# Patient Record
Sex: Female | Born: 1970 | Race: White | Hispanic: No | Marital: Married | Smoking: Former smoker
Health system: Southern US, Community
[De-identification: ages and names within clinical notes are randomized; demographics above are authoritative.]

## PROBLEM LIST (undated history)

## (undated) DIAGNOSIS — R002 Palpitations: Secondary | ICD-10-CM

## (undated) DIAGNOSIS — F419 Anxiety disorder, unspecified: Secondary | ICD-10-CM

## (undated) DIAGNOSIS — I10 Essential (primary) hypertension: Secondary | ICD-10-CM

## (undated) DIAGNOSIS — K219 Gastro-esophageal reflux disease without esophagitis: Secondary | ICD-10-CM

## (undated) DIAGNOSIS — R5383 Other fatigue: Secondary | ICD-10-CM

## (undated) HISTORY — DX: Palpitations: R00.2

## (undated) HISTORY — DX: Essential (primary) hypertension: I10

## (undated) HISTORY — PX: ABDOMINAL HYSTERECTOMY: SHX81

## (undated) HISTORY — DX: Gastro-esophageal reflux disease without esophagitis: K21.9

## (undated) HISTORY — DX: Anxiety disorder, unspecified: F41.9

## (undated) HISTORY — DX: Other fatigue: R53.83

---
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
## 1996-09-09 HISTORY — PX: DILATION AND CURETTAGE OF UTERUS: SHX78

## 1998-08-29 ENCOUNTER — Inpatient Hospital Stay (HOSPITAL_COMMUNITY): Admission: AD | Admit: 1998-08-29 | Discharge: 1998-08-29 | Payer: Self-pay | Admitting: Obstetrics and Gynecology

## 1998-09-02 ENCOUNTER — Inpatient Hospital Stay (HOSPITAL_COMMUNITY): Admission: AD | Admit: 1998-09-02 | Discharge: 1998-09-05 | Payer: Self-pay | Admitting: Obstetrics and Gynecology

## 1998-10-17 ENCOUNTER — Other Ambulatory Visit: Admission: RE | Admit: 1998-10-17 | Discharge: 1998-10-17 | Payer: Self-pay | Admitting: Obstetrics and Gynecology

## 2000-06-10 ENCOUNTER — Other Ambulatory Visit: Admission: RE | Admit: 2000-06-10 | Discharge: 2000-06-10 | Payer: Self-pay | Admitting: Obstetrics and Gynecology

## 2004-01-24 ENCOUNTER — Encounter: Admission: RE | Admit: 2004-01-24 | Discharge: 2004-01-24 | Payer: Self-pay | Admitting: Family Medicine

## 2005-03-13 ENCOUNTER — Other Ambulatory Visit: Admission: RE | Admit: 2005-03-13 | Discharge: 2005-03-13 | Payer: Self-pay | Admitting: Obstetrics and Gynecology

## 2005-03-19 ENCOUNTER — Encounter: Admission: RE | Admit: 2005-03-19 | Discharge: 2005-03-19 | Payer: Self-pay | Admitting: Obstetrics and Gynecology

## 2006-12-04 ENCOUNTER — Ambulatory Visit (HOSPITAL_COMMUNITY): Admission: RE | Admit: 2006-12-04 | Discharge: 2006-12-05 | Payer: Self-pay | Admitting: Obstetrics and Gynecology

## 2007-02-06 DIAGNOSIS — F411 Generalized anxiety disorder: Secondary | ICD-10-CM | POA: Insufficient documentation

## 2007-02-06 DIAGNOSIS — G43909 Migraine, unspecified, not intractable, without status migrainosus: Secondary | ICD-10-CM

## 2007-02-06 DIAGNOSIS — F17200 Nicotine dependence, unspecified, uncomplicated: Secondary | ICD-10-CM

## 2007-02-06 DIAGNOSIS — G4700 Insomnia, unspecified: Secondary | ICD-10-CM

## 2007-02-06 HISTORY — DX: Generalized anxiety disorder: F41.1

## 2007-02-06 HISTORY — DX: Insomnia, unspecified: G47.00

## 2007-02-06 HISTORY — DX: Nicotine dependence, unspecified, uncomplicated: F17.200

## 2007-02-06 HISTORY — DX: Migraine, unspecified, not intractable, without status migrainosus: G43.909

## 2017-03-18 DIAGNOSIS — L728 Other follicular cysts of the skin and subcutaneous tissue: Secondary | ICD-10-CM | POA: Diagnosis not present

## 2017-03-18 DIAGNOSIS — L930 Discoid lupus erythematosus: Secondary | ICD-10-CM | POA: Diagnosis not present

## 2017-03-18 DIAGNOSIS — L570 Actinic keratosis: Secondary | ICD-10-CM | POA: Diagnosis not present

## 2017-03-18 DIAGNOSIS — L578 Other skin changes due to chronic exposure to nonionizing radiation: Secondary | ICD-10-CM | POA: Diagnosis not present

## 2017-03-18 DIAGNOSIS — Z79899 Other long term (current) drug therapy: Secondary | ICD-10-CM | POA: Diagnosis not present

## 2017-12-05 DIAGNOSIS — Z79899 Other long term (current) drug therapy: Secondary | ICD-10-CM | POA: Diagnosis not present

## 2017-12-05 DIAGNOSIS — R232 Flushing: Secondary | ICD-10-CM | POA: Diagnosis not present

## 2017-12-05 DIAGNOSIS — R609 Edema, unspecified: Secondary | ICD-10-CM | POA: Diagnosis not present

## 2017-12-05 DIAGNOSIS — L932 Other local lupus erythematosus: Secondary | ICD-10-CM | POA: Diagnosis not present

## 2017-12-05 DIAGNOSIS — R0789 Other chest pain: Secondary | ICD-10-CM | POA: Diagnosis not present

## 2018-01-09 DIAGNOSIS — K0889 Other specified disorders of teeth and supporting structures: Secondary | ICD-10-CM | POA: Diagnosis not present

## 2018-02-10 DIAGNOSIS — N951 Menopausal and female climacteric states: Secondary | ICD-10-CM | POA: Diagnosis not present

## 2018-02-10 DIAGNOSIS — Z6821 Body mass index (BMI) 21.0-21.9, adult: Secondary | ICD-10-CM | POA: Diagnosis not present

## 2018-02-28 DIAGNOSIS — R9431 Abnormal electrocardiogram [ECG] [EKG]: Secondary | ICD-10-CM | POA: Diagnosis not present

## 2018-02-28 DIAGNOSIS — R072 Precordial pain: Secondary | ICD-10-CM | POA: Diagnosis not present

## 2018-02-28 DIAGNOSIS — K50019 Crohn's disease of small intestine with unspecified complications: Secondary | ICD-10-CM | POA: Diagnosis not present

## 2018-02-28 DIAGNOSIS — E876 Hypokalemia: Secondary | ICD-10-CM | POA: Diagnosis not present

## 2018-02-28 DIAGNOSIS — R945 Abnormal results of liver function studies: Secondary | ICD-10-CM | POA: Diagnosis not present

## 2018-02-28 DIAGNOSIS — R079 Chest pain, unspecified: Secondary | ICD-10-CM | POA: Diagnosis not present

## 2018-03-01 DIAGNOSIS — R112 Nausea with vomiting, unspecified: Secondary | ICD-10-CM

## 2018-03-01 DIAGNOSIS — R079 Chest pain, unspecified: Secondary | ICD-10-CM | POA: Diagnosis not present

## 2018-03-01 DIAGNOSIS — K50019 Crohn's disease of small intestine with unspecified complications: Secondary | ICD-10-CM | POA: Diagnosis not present

## 2018-03-01 DIAGNOSIS — E871 Hypo-osmolality and hyponatremia: Secondary | ICD-10-CM | POA: Diagnosis not present

## 2018-03-01 DIAGNOSIS — R072 Precordial pain: Secondary | ICD-10-CM | POA: Diagnosis not present

## 2018-03-01 DIAGNOSIS — D72829 Elevated white blood cell count, unspecified: Secondary | ICD-10-CM | POA: Diagnosis not present

## 2018-03-01 DIAGNOSIS — N179 Acute kidney failure, unspecified: Secondary | ICD-10-CM | POA: Diagnosis not present

## 2018-03-01 DIAGNOSIS — R739 Hyperglycemia, unspecified: Secondary | ICD-10-CM | POA: Diagnosis not present

## 2018-03-01 DIAGNOSIS — E876 Hypokalemia: Secondary | ICD-10-CM | POA: Diagnosis not present

## 2018-03-01 DIAGNOSIS — R9431 Abnormal electrocardiogram [ECG] [EKG]: Secondary | ICD-10-CM | POA: Diagnosis not present

## 2018-03-01 DIAGNOSIS — K5000 Crohn's disease of small intestine without complications: Secondary | ICD-10-CM

## 2018-03-10 DIAGNOSIS — E279 Disorder of adrenal gland, unspecified: Secondary | ICD-10-CM | POA: Diagnosis not present

## 2018-03-10 DIAGNOSIS — R079 Chest pain, unspecified: Secondary | ICD-10-CM | POA: Diagnosis not present

## 2018-03-10 DIAGNOSIS — K5000 Crohn's disease of small intestine without complications: Secondary | ICD-10-CM | POA: Diagnosis not present

## 2018-04-07 DIAGNOSIS — L821 Other seborrheic keratosis: Secondary | ICD-10-CM | POA: Diagnosis not present

## 2018-04-07 DIAGNOSIS — L930 Discoid lupus erythematosus: Secondary | ICD-10-CM | POA: Diagnosis not present

## 2018-04-07 DIAGNOSIS — B079 Viral wart, unspecified: Secondary | ICD-10-CM | POA: Diagnosis not present

## 2018-04-07 DIAGNOSIS — L578 Other skin changes due to chronic exposure to nonionizing radiation: Secondary | ICD-10-CM | POA: Diagnosis not present

## 2018-05-02 DIAGNOSIS — Z6821 Body mass index (BMI) 21.0-21.9, adult: Secondary | ICD-10-CM | POA: Diagnosis not present

## 2018-05-02 DIAGNOSIS — R300 Dysuria: Secondary | ICD-10-CM | POA: Diagnosis not present

## 2018-05-19 DIAGNOSIS — R1033 Periumbilical pain: Secondary | ICD-10-CM | POA: Diagnosis not present

## 2018-05-19 DIAGNOSIS — R1013 Epigastric pain: Secondary | ICD-10-CM | POA: Diagnosis not present

## 2018-05-19 DIAGNOSIS — K591 Functional diarrhea: Secondary | ICD-10-CM | POA: Diagnosis not present

## 2018-06-06 DIAGNOSIS — R935 Abnormal findings on diagnostic imaging of other abdominal regions, including retroperitoneum: Secondary | ICD-10-CM | POA: Diagnosis not present

## 2018-06-06 DIAGNOSIS — K222 Esophageal obstruction: Secondary | ICD-10-CM | POA: Diagnosis not present

## 2018-06-06 DIAGNOSIS — K209 Esophagitis, unspecified: Secondary | ICD-10-CM | POA: Diagnosis not present

## 2018-06-06 DIAGNOSIS — K591 Functional diarrhea: Secondary | ICD-10-CM | POA: Diagnosis not present

## 2018-06-06 DIAGNOSIS — K210 Gastro-esophageal reflux disease with esophagitis: Secondary | ICD-10-CM | POA: Diagnosis not present

## 2018-06-06 DIAGNOSIS — K219 Gastro-esophageal reflux disease without esophagitis: Secondary | ICD-10-CM | POA: Diagnosis not present

## 2018-06-06 DIAGNOSIS — D128 Benign neoplasm of rectum: Secondary | ICD-10-CM | POA: Diagnosis not present

## 2018-06-30 DIAGNOSIS — R918 Other nonspecific abnormal finding of lung field: Secondary | ICD-10-CM | POA: Diagnosis not present

## 2018-06-30 DIAGNOSIS — L932 Other local lupus erythematosus: Secondary | ICD-10-CM | POA: Diagnosis not present

## 2018-06-30 DIAGNOSIS — Z79899 Other long term (current) drug therapy: Secondary | ICD-10-CM | POA: Diagnosis not present

## 2018-06-30 DIAGNOSIS — E279 Disorder of adrenal gland, unspecified: Secondary | ICD-10-CM | POA: Diagnosis not present

## 2018-07-14 DIAGNOSIS — M25512 Pain in left shoulder: Secondary | ICD-10-CM | POA: Diagnosis not present

## 2018-07-14 DIAGNOSIS — R918 Other nonspecific abnormal finding of lung field: Secondary | ICD-10-CM | POA: Diagnosis not present

## 2018-07-14 DIAGNOSIS — K591 Functional diarrhea: Secondary | ICD-10-CM | POA: Diagnosis not present

## 2018-07-14 DIAGNOSIS — R1013 Epigastric pain: Secondary | ICD-10-CM | POA: Diagnosis not present

## 2018-07-14 DIAGNOSIS — M1711 Unilateral primary osteoarthritis, right knee: Secondary | ICD-10-CM | POA: Diagnosis not present

## 2018-07-14 DIAGNOSIS — M25562 Pain in left knee: Secondary | ICD-10-CM | POA: Diagnosis not present

## 2018-07-14 DIAGNOSIS — E279 Disorder of adrenal gland, unspecified: Secondary | ICD-10-CM | POA: Diagnosis not present

## 2018-07-14 DIAGNOSIS — M79642 Pain in left hand: Secondary | ICD-10-CM | POA: Diagnosis not present

## 2018-07-14 DIAGNOSIS — M79641 Pain in right hand: Secondary | ICD-10-CM | POA: Diagnosis not present

## 2018-07-14 DIAGNOSIS — D3501 Benign neoplasm of right adrenal gland: Secondary | ICD-10-CM | POA: Diagnosis not present

## 2018-07-14 DIAGNOSIS — D3502 Benign neoplasm of left adrenal gland: Secondary | ICD-10-CM | POA: Diagnosis not present

## 2018-07-14 DIAGNOSIS — K219 Gastro-esophageal reflux disease without esophagitis: Secondary | ICD-10-CM | POA: Diagnosis not present

## 2018-07-14 DIAGNOSIS — M25561 Pain in right knee: Secondary | ICD-10-CM | POA: Diagnosis not present

## 2018-07-17 DIAGNOSIS — L988 Other specified disorders of the skin and subcutaneous tissue: Secondary | ICD-10-CM | POA: Diagnosis not present

## 2018-12-20 DIAGNOSIS — S46812A Strain of other muscles, fascia and tendons at shoulder and upper arm level, left arm, initial encounter: Secondary | ICD-10-CM | POA: Diagnosis not present

## 2019-01-05 DIAGNOSIS — R918 Other nonspecific abnormal finding of lung field: Secondary | ICD-10-CM | POA: Diagnosis not present

## 2019-01-05 DIAGNOSIS — M546 Pain in thoracic spine: Secondary | ICD-10-CM | POA: Diagnosis not present

## 2019-01-05 DIAGNOSIS — Z79899 Other long term (current) drug therapy: Secondary | ICD-10-CM | POA: Diagnosis not present

## 2019-01-05 DIAGNOSIS — L932 Other local lupus erythematosus: Secondary | ICD-10-CM | POA: Diagnosis not present

## 2019-01-08 DIAGNOSIS — R918 Other nonspecific abnormal finding of lung field: Secondary | ICD-10-CM | POA: Diagnosis not present

## 2019-01-08 DIAGNOSIS — D3502 Benign neoplasm of left adrenal gland: Secondary | ICD-10-CM | POA: Diagnosis not present

## 2019-01-08 DIAGNOSIS — I700 Atherosclerosis of aorta: Secondary | ICD-10-CM | POA: Diagnosis not present

## 2019-04-09 DIAGNOSIS — L570 Actinic keratosis: Secondary | ICD-10-CM | POA: Diagnosis not present

## 2019-04-09 DIAGNOSIS — L821 Other seborrheic keratosis: Secondary | ICD-10-CM | POA: Diagnosis not present

## 2019-04-09 DIAGNOSIS — L578 Other skin changes due to chronic exposure to nonionizing radiation: Secondary | ICD-10-CM | POA: Diagnosis not present

## 2019-04-09 DIAGNOSIS — L930 Discoid lupus erythematosus: Secondary | ICD-10-CM | POA: Diagnosis not present

## 2019-04-09 DIAGNOSIS — D485 Neoplasm of uncertain behavior of skin: Secondary | ICD-10-CM | POA: Diagnosis not present

## 2021-03-10 ENCOUNTER — Telehealth: Payer: Self-pay | Admitting: Hematology and Oncology

## 2021-03-10 NOTE — Telephone Encounter (Signed)
Patient referred by Cyndi Bender, PA-C for Lung Nodules.  Appt made for 03/13/21 Labs 1:00 pm - Consult 1:30 pm

## 2021-03-13 ENCOUNTER — Other Ambulatory Visit: Payer: Self-pay

## 2021-03-13 ENCOUNTER — Telehealth: Payer: Self-pay | Admitting: Hematology and Oncology

## 2021-03-13 ENCOUNTER — Inpatient Hospital Stay (INDEPENDENT_AMBULATORY_CARE_PROVIDER_SITE_OTHER): Payer: Commercial Managed Care - PPO | Admitting: Hematology and Oncology

## 2021-03-13 ENCOUNTER — Other Ambulatory Visit: Payer: Self-pay | Admitting: Hematology and Oncology

## 2021-03-13 ENCOUNTER — Inpatient Hospital Stay: Payer: Commercial Managed Care - PPO | Attending: Hematology and Oncology

## 2021-03-13 ENCOUNTER — Encounter: Payer: Self-pay | Admitting: Hematology and Oncology

## 2021-03-13 VITALS — BP 117/65 | HR 94 | Temp 98.2°F | Resp 18 | Ht 61.0 in | Wt 116.9 lb

## 2021-03-13 DIAGNOSIS — L931 Subacute cutaneous lupus erythematosus: Secondary | ICD-10-CM | POA: Diagnosis present

## 2021-03-13 DIAGNOSIS — R918 Other nonspecific abnormal finding of lung field: Secondary | ICD-10-CM

## 2021-03-13 LAB — HEPATIC FUNCTION PANEL
ALT: 51 — AB (ref 7–35)
AST: 50 — AB (ref 13–35)
Alkaline Phosphatase: 89 (ref 25–125)
Bilirubin, Total: 0.2

## 2021-03-13 LAB — CBC AND DIFFERENTIAL
HCT: 40 (ref 36–46)
Hemoglobin: 13.5 (ref 12.0–16.0)
Neutrophils Absolute: 2.94
Platelets: 223 (ref 150–399)
WBC: 6.4

## 2021-03-13 LAB — BASIC METABOLIC PANEL
BUN: 10 (ref 4–21)
CO2: 26 — AB (ref 13–22)
Chloride: 105 (ref 99–108)
Creatinine: 0.6 (ref 0.5–1.1)
Glucose: 107
Potassium: 3.9 (ref 3.4–5.3)
Sodium: 140 (ref 137–147)

## 2021-03-13 LAB — CBC
MCV: 92 (ref 81–99)
RBC: 4.34 (ref 3.87–5.11)

## 2021-03-13 LAB — COMPREHENSIVE METABOLIC PANEL
Albumin: 4.5 (ref 3.5–5.0)
Calcium: 9.2 (ref 8.7–10.7)

## 2021-03-13 LAB — PROTIME-INR
INR: 0.9 (ref 0.8–1.2)
Prothrombin Time: 12.2 seconds (ref 11.4–15.2)

## 2021-03-13 NOTE — Progress Notes (Signed)
[facility]  [address] [place],  [place]  [address] [phone]  Clinic Day:  03/13/2021  Referring physician: Cyndi Bender, PA-C   CHIEF COMPLAINT:  CC: A 50 year old female with history of slightly increasing lung nodules mildly hypermetabolic on PET here for further treatment planning.   Current Treatment:  Diagnostic   HISTORY OF PRESENT ILLNESS:  Kirsten Williams is a 50 y.o. female with a history of lung nodules dating back to 2019 who is referred in consultation with Cyndi Bender, PA-C for assessment and management. She is a former smoker having quit in 2016. 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 She missed her screening CT last year and imaging was ordered last month. 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 PET imaging was recommended. PET images revealed some of the ground-glass density nodules are mildly hypermetabolic including the right upper lobe sub solid nodule (1.0 cm) with SUV 2.5 suspicious for multifocal low grade adenocarcinoma; mildly hypermetabolic upper normal sized lower paratracheal lymph node (0.8 cm) with maximum SUV of 4.0 as compared to background blood pool activity of 2.2. Malignant involvement is difficult to exclude.   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 She follows with Dr. Lyda Jester for chronic nausea and vomiting. Her last episode was in December 2021.She had a colonoscopy in 2019 which revealed rectal polyps. Repeat examination was recommended for 5 years (2024). She also had a hysterectomy in 2001. She is not up to date on mammogram and thinks her most recent was a couple of years ago. She has a chronic cough. Today she denies fever, chills, nausea or vomiting. She denies shortness of breath or  chest pain. She denies issue with bowel or bladder.  She denies family history of cancer. CBC today is unremarkable. CMP reveals elevated liver enzymes for which she states she has had in the past.   REVIEW OF SYSTEMS:  Review of Systems  Constitutional: Negative for appetite change, chills, diaphoresis, fatigue, fever and unexpected weight change.  HENT:   Negative for hearing loss, lump/mass, mouth sores, nosebleeds, sore throat, tinnitus, trouble swallowing and voice change.   Eyes: Negative for eye problems and icterus.  Respiratory: Negative for chest tightness, cough, hemoptysis, shortness of breath and wheezing.   Cardiovascular: Negative for chest pain, leg swelling and palpitations.  Gastrointestinal: Negative for abdominal distention, abdominal pain, blood in stool, constipation, diarrhea, nausea, rectal pain and vomiting.  Endocrine: Negative for hot flashes.  Genitourinary: Negative for bladder incontinence, difficulty urinating, dyspareunia, dysuria, frequency, hematuria and nocturia.   Musculoskeletal: Negative for arthralgias, back pain, flank pain, gait problem, myalgias, neck pain and neck stiffness.  Skin: Negative for itching, rash and wound.  Neurological: Negative for dizziness, extremity weakness, gait problem, headaches, light-headedness, numbness, seizures and speech difficulty.  Hematological: Negative for adenopathy. Does not bruise/bleed easily.  Psychiatric/Behavioral: Negative for confusion, decreased concentration, depression, sleep disturbance and suicidal ideas. The patient is not nervous/anxious.      VITALS:  Blood pressure 117/65, pulse 94, temperature 98.2 F (36.8 C), temperature source Oral, resp. rate 18, height 5\' 1"  (1.549 m), weight 116 lb 14.4 oz (53 kg), SpO2 96 %.  Wt Readings from Last 3 Encounters:  03/13/21 116 lb 14.4 oz (53 kg)    Body mass index is 22.09 kg/m.  Performance status (ECOG):  0 - Asymptomatic  PHYSICAL EXAM:  Physical  Exam Constitutional:      General: She is not in acute distress.    Appearance: Normal appearance. She is normal weight. She is not ill-appearing, toxic-appearing or diaphoretic.  HENT:     Head: Normocephalic and atraumatic.     Nose: Nose normal. No congestion or rhinorrhea.     Mouth/Throat:     Mouth: Mucous membranes are moist.     Pharynx: Oropharynx is clear. No oropharyngeal exudate or posterior oropharyngeal erythema.  Eyes:     General: No scleral icterus.       Right eye: No discharge.        Left eye: No discharge.     Extraocular Movements: Extraocular movements intact.     Conjunctiva/sclera: Conjunctivae normal.     Pupils: Pupils are equal, round, and reactive to light.  Neck:     Vascular: No carotid bruit.  Cardiovascular:     Rate and Rhythm: Normal rate and regular rhythm.     Heart sounds: No murmur heard. No friction rub. No gallop.   Pulmonary:     Effort: Pulmonary effort is normal. No respiratory distress.     Breath sounds: Normal breath sounds. No stridor. No wheezing, rhonchi or rales.  Chest:     Chest wall: No tenderness.  Abdominal:     General: Abdomen is flat. Bowel sounds are normal. There is no distension.     Palpations: There is no mass.     Tenderness: There is no abdominal tenderness. There is no right CVA tenderness, left CVA tenderness, guarding or rebound.     Hernia: No hernia is present.  Musculoskeletal:        General: No swelling, tenderness, deformity or signs of injury. Normal range of motion.     Cervical back: Normal range of motion and neck supple. No rigidity or tenderness.     Right lower leg: No edema.     Left lower leg: No edema.  Lymphadenopathy:     Cervical: No cervical adenopathy.  Skin:    General: Skin is warm and dry.     Capillary Refill: Capillary refill takes less than 2 seconds.     Coloration: Skin is not jaundiced or pale.     Findings: No bruising, erythema, lesion or rash.  Neurological:     General:  No focal deficit present.     Mental Status: She is alert and oriented to person, place, and time. Mental status is at baseline.     Cranial Nerves: No cranial nerve deficit.     Sensory: No sensory deficit.     Motor: No weakness.     Coordination: Coordination normal.     Gait: Gait normal.     Deep Tendon Reflexes: Reflexes normal.  Psychiatric:        Mood and Affect: Mood normal.        Behavior: Behavior normal.        Thought Content: Thought content normal.        Judgment: Judgment normal.    Lymph nodes:   There is no cervical, clavicular, axillary or lymphadenopathy.  LABS:   CBC Latest Ref Rng & Units 03/13/2021  WBC - 6.4  Hemoglobin 12.0 - 16.0 13.5  Hematocrit 36 - 46 40  Platelets 150 - 399 223   CMP Latest Ref Rng & Units 03/13/2021  BUN 4 - 21 10  Creatinine 0.5 - 1.1 0.6  Sodium 137 -  147 140  Potassium 3.4 - 5.3 3.9  Chloride 99 - 108 105  CO2 13 - 22 26(A)  Calcium 8.7 - 10.7 9.2  Alkaline Phos 25 - 125 89  AST 13 - 35 50(A)  ALT 7 - 35 51(A)     No results found for: CEA1 / No results found for: CEA1 No results found for: PSA1 No results found for: PJS315 No results found for: CAN125  No results found for: TOTALPROTELP, ALBUMINELP, A1GS, A2GS, BETS, BETA2SER, GAMS, MSPIKE, SPEI No results found for: TIBC, FERRITIN, IRONPCTSAT No results found for: LDH  STUDIES:  No results found.    HISTORY:   Past Medical History:  Diagnosis Date  . Anxiety   . GERD (gastroesophageal reflux disease)       Family History  Problem Relation Age of Onset  . Stroke Mother   . Stroke Father   . Cancer Father   . Hypertension Sister     Social History:  reports that she quit smoking about 6 years ago. Her smoking use included cigarettes. She has a 48.00 pack-year smoking history. She has never used smokeless tobacco. She reports previous alcohol use. No history on file for drug use.The patient is alone  today.  Allergies: No Known  Allergies  Current Medications: Current Outpatient Medications  Medication Sig Dispense Refill  . dicyclomine (BENTYL) 10 MG capsule Take 10 mg by mouth 4 (four) times daily -  before meals and at bedtime.    . Multiple Vitamin (MULTIVITAMIN) tablet Take 1 tablet by mouth daily.    Marland Kitchen omeprazole (PRILOSEC) 40 MG capsule Take 40 mg by mouth daily.    . rizatriptan (MAXALT) 10 MG tablet Take 10 mg by mouth as needed. May repeat in 2 hours if needed    . cyclobenzaprine (FLEXERIL) 10 MG tablet Take 1 tablet by mouth at bedtime.    . hydroxychloroquine (PLAQUENIL) 200 MG tablet Take 200 mg by mouth 2 (two) times daily.    . ondansetron (ZOFRAN) 4 MG tablet Take 8 mg by mouth 2 (two) times daily as needed.     No current facility-administered medications for this visit.     ASSESSMENT & PLAN:   Assessment:  Kirsten Williams is a 50 y.o. female with a history of lung nodules that are now mildly hypermetabolic on PET imaging. We reviewed the CT and PET reports in detail. We discussed the size of the lung nodules increasing slightly, but also more solid in appearance. We would like Pulmonary's assistance in evaluating the patient and possible biopsy. She understands that our concern is rule out malignancy and she is agreeable to referral.   Plan: 1.  I will refer to [facility] in [facility] for evaluation and possible tissue biopsy. 2.  She will return to clinic in 2 weeks for review of Pulmonology's findings and to discuss further treatment planning.   She verbalizes understanding of and agreement to the plans discussed today. She knows to call the office should any new questions or concerns arise.    Melodye Ped, NP

## 2021-03-13 NOTE — Telephone Encounter (Signed)
Per 4/4 LOS/Melissa, patient scheduled for 4/18 Follow Up.  Gave patient Appt Summary

## 2021-03-24 NOTE — Progress Notes (Deleted)
[facility]  [address] [place],  [place]  [address] [phone]  Clinic Day:  03/24/2021  Referring physician: Cyndi Bender, PA-C   CHIEF COMPLAINT:  CC: A 50 year old female with history of slightly increasing lung nodules mildly hypermetabolic on PET here for further treatment planning.   Current Treatment:  Diagnostic   HISTORY OF PRESENT ILLNESS:  Kirsten Williams is a 50 y.o. female with a history of lung nodules dating back to 2019 who is referred in consultation with Cyndi Bender, PA-C for assessment and management. She is a former smoker having quit in 2016. 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 She missed her screening CT last year and imaging was ordered last month. 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 PET imaging was recommended. PET images revealed some of the ground-glass density nodules are mildly hypermetabolic including the right upper lobe sub solid nodule (1.0 cm) with SUV 2.5 suspicious for multifocal low grade adenocarcinoma; mildly hypermetabolic upper normal sized lower paratracheal lymph node (0.8 cm) with maximum SUV of 4.0 as compared to background blood pool activity of 2.2. Malignant involvement is difficult to exclude.   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 She follows with Dr. Lyda Jester for chronic nausea and vomiting. Her last episode was in December 2021.She had a colonoscopy in 2019 which revealed rectal polyps. Repeat examination was recommended for 5 years (2024). She also had a hysterectomy in 2001. She is not up to date on mammogram and thinks her most recent was a couple of years ago. She has a chronic cough. Today she denies fever, chills, nausea or vomiting. She denies shortness of breath or  chest pain. She denies issue with bowel or bladder.  She denies family history of cancer. CBC today is unremarkable. CMP reveals elevated liver enzymes for which she states she has had in the past.   REVIEW OF SYSTEMS:  Review of Systems  Constitutional: Negative for appetite change, chills, diaphoresis, fatigue, fever and unexpected weight change.  HENT:   Negative for hearing loss, lump/mass, mouth sores, nosebleeds, sore throat, tinnitus, trouble swallowing and voice change.   Eyes: Negative for eye problems and icterus.  Respiratory: Negative for chest tightness, cough, hemoptysis, shortness of breath and wheezing.   Cardiovascular: Negative for chest pain, leg swelling and palpitations.  Gastrointestinal: Negative for abdominal distention, abdominal pain, blood in stool, constipation, diarrhea, nausea, rectal pain and vomiting.  Endocrine: Negative for hot flashes.  Genitourinary: Negative for bladder incontinence, difficulty urinating, dyspareunia, dysuria, frequency, hematuria and nocturia.   Musculoskeletal: Negative for arthralgias, back pain, flank pain, gait problem, myalgias, neck pain and neck stiffness.  Skin: Negative for itching, rash and wound.  Neurological: Negative for dizziness, extremity weakness, gait problem, headaches, light-headedness, numbness, seizures and speech difficulty.  Hematological: Negative for adenopathy. Does not bruise/bleed easily.  Psychiatric/Behavioral: Negative for confusion, decreased concentration, depression, sleep disturbance and suicidal ideas. The patient is not nervous/anxious.      VITALS:  There were no vitals taken for this visit.  Wt Readings from Last 3 Encounters:  03/13/21 116 lb 14.4 oz (53 kg)    There is no height or weight on file to calculate BMI.  Performance status (ECOG): 0 - Asymptomatic  PHYSICAL EXAM:  Physical Exam Constitutional:      General: She is  not in acute distress.    Appearance: Normal appearance. She is  normal weight. She is not ill-appearing, toxic-appearing or diaphoretic.  HENT:     Head: Normocephalic and atraumatic.     Nose: Nose normal. No congestion or rhinorrhea.     Mouth/Throat:     Mouth: Mucous membranes are moist.     Pharynx: Oropharynx is clear. No oropharyngeal exudate or posterior oropharyngeal erythema.  Eyes:     General: No scleral icterus.       Right eye: No discharge.        Left eye: No discharge.     Extraocular Movements: Extraocular movements intact.     Conjunctiva/sclera: Conjunctivae normal.     Pupils: Pupils are equal, round, and reactive to light.  Neck:     Vascular: No carotid bruit.  Cardiovascular:     Rate and Rhythm: Normal rate and regular rhythm.     Heart sounds: No murmur heard. No friction rub. No gallop.   Pulmonary:     Effort: Pulmonary effort is normal. No respiratory distress.     Breath sounds: Normal breath sounds. No stridor. No wheezing, rhonchi or rales.  Chest:     Chest wall: No tenderness.  Abdominal:     General: Abdomen is flat. Bowel sounds are normal. There is no distension.     Palpations: There is no mass.     Tenderness: There is no abdominal tenderness. There is no right CVA tenderness, left CVA tenderness, guarding or rebound.     Hernia: No hernia is present.  Musculoskeletal:        General: No swelling, tenderness, deformity or signs of injury. Normal range of motion.     Cervical back: Normal range of motion and neck supple. No rigidity or tenderness.     Right lower leg: No edema.     Left lower leg: No edema.  Lymphadenopathy:     Cervical: No cervical adenopathy.  Skin:    General: Skin is warm and dry.     Capillary Refill: Capillary refill takes less than 2 seconds.     Coloration: Skin is not jaundiced or pale.     Findings: No bruising, erythema, lesion or rash.  Neurological:     General: No focal deficit present.     Mental Status: She is alert and oriented to person, place, and time. Mental  status is at baseline.     Cranial Nerves: No cranial nerve deficit.     Sensory: No sensory deficit.     Motor: No weakness.     Coordination: Coordination normal.     Gait: Gait normal.     Deep Tendon Reflexes: Reflexes normal.  Psychiatric:        Mood and Affect: Mood normal.        Behavior: Behavior normal.        Thought Content: Thought content normal.        Judgment: Judgment normal.    Lymph nodes:   There is no cervical, clavicular, axillary or lymphadenopathy.  LABS:   CBC Latest Ref Rng & Units 03/13/2021  WBC - 6.4  Hemoglobin 12.0 - 16.0 13.5  Hematocrit 36 - 46 40  Platelets 150 - 399 223   CMP Latest Ref Rng & Units 03/13/2021  BUN 4 - 21 10  Creatinine 0.5 - 1.1 0.6  Sodium 137 - 147 140  Potassium 3.4 - 5.3 3.9  Chloride 99 - 108 105  CO2 13 -  22 26(A)  Calcium 8.7 - 10.7 9.2  Alkaline Phos 25 - 125 89  AST 13 - 35 50(A)  ALT 7 - 35 51(A)     No results found for: CEA1 / No results found for: CEA1 No results found for: PSA1 No results found for: TDH741 No results found for: CAN125  No results found for: TOTALPROTELP, ALBUMINELP, A1GS, A2GS, BETS, BETA2SER, GAMS, MSPIKE, SPEI No results found for: TIBC, FERRITIN, IRONPCTSAT No results found for: LDH  STUDIES:  No results found.    HISTORY:   Past Medical History:  Diagnosis Date  . Anxiety   . GERD (gastroesophageal reflux disease)       Family History  Problem Relation Age of Onset  . Stroke Mother   . Stroke Father   . Cancer Father   . Hypertension Sister     Social History:  reports that she quit smoking about 6 years ago. Her smoking use included cigarettes. She has a 48.00 pack-year smoking history. She has never used smokeless tobacco. She reports previous alcohol use. No history on file for drug use.The patient is alone  today.  Allergies: No Known Allergies  Current Medications: Current Outpatient Medications  Medication Sig Dispense Refill  . cyclobenzaprine  (FLEXERIL) 10 MG tablet Take 1 tablet by mouth at bedtime.    . dicyclomine (BENTYL) 10 MG capsule Take 10 mg by mouth 4 (four) times daily -  before meals and at bedtime.    . hydroxychloroquine (PLAQUENIL) 200 MG tablet Take 200 mg by mouth 2 (two) times daily.    . Multiple Vitamin (MULTIVITAMIN) tablet Take 1 tablet by mouth daily.    Marland Kitchen omeprazole (PRILOSEC) 40 MG capsule Take 40 mg by mouth daily.    . ondansetron (ZOFRAN) 4 MG tablet Take 8 mg by mouth 2 (two) times daily as needed.    . rizatriptan (MAXALT) 10 MG tablet Take 10 mg by mouth as needed. May repeat in 2 hours if needed     No current facility-administered medications for this visit.     ASSESSMENT & PLAN:   Assessment:  Kirsten Williams is a 50 y.o. female with a history of lung nodules that are now mildly hypermetabolic on PET imaging. We reviewed the CT and PET reports in detail. We discussed the size of the lung nodules increasing slightly, but also more solid in appearance. We would like Pulmonary's assistance in evaluating the patient and possible biopsy. She understands that our concern is rule out malignancy and she is agreeable to referral.   Plan: 1.  I will refer to [facility] in [facility] for evaluation and possible tissue biopsy. 2.  She will return to clinic in 2 weeks for review of Pulmonology's findings and to discuss further treatment planning.   She verbalizes understanding of and agreement to the plans discussed today. She knows to call the office should any new questions or concerns arise.    Melodye Ped, NP

## 2021-03-27 ENCOUNTER — Other Ambulatory Visit: Payer: Self-pay

## 2021-03-27 ENCOUNTER — Inpatient Hospital Stay (INDEPENDENT_AMBULATORY_CARE_PROVIDER_SITE_OTHER): Payer: Commercial Managed Care - PPO | Admitting: Hematology and Oncology

## 2021-03-27 VITALS — BP 123/60 | HR 82 | Temp 98.1°F | Resp 18 | Ht 61.0 in | Wt 115.9 lb

## 2021-03-27 DIAGNOSIS — R918 Other nonspecific abnormal finding of lung field: Secondary | ICD-10-CM

## 2021-03-27 NOTE — Progress Notes (Signed)
[facility]  [address] [place],  [place]  [address] [phone]  Clinic Day:  03/27/2021  Referring physician: Cyndi Bender, PA-C   CHIEF COMPLAINT:  CC: A 50- year old female with history of lung nodules here for further workup regarding lung nodules  Current Treatment:  Diagnostic   HISTORY OF PRESENT ILLNESS:  Kirsten Williams is a 50 y.o. female with a history of lung nodules dating back to 2019 who is referred in consultation with Cyndi Bender, PA-C for assessment and management. She is a former smoker having quit in 2016. 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 She missed her screening CT last year and imaging was ordered last month. 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 PET imaging was recommended. PET images revealed some of the ground-glass density nodules are mildly hypermetabolic including the right upper lobe sub solid nodule (1.0 cm) with SUV 2.5 suspicious for multifocal low grade adenocarcinoma; mildly hypermetabolic upper normal sized lower paratracheal lymph node (0.8 cm) with maximum SUV of 4.0 as compared to background blood pool activity of 2.2. Malignant involvement is difficult to exclude.   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 She follows with Dr. Lyda Jester for chronic nausea and vomiting. Her last episode was in December 2021.She had a colonoscopy in 2019 which revealed rectal polyps. Repeat examination was recommended for 5 years (2024). She also had a hysterectomy in 2001. She is not up to date on mammogram and thinks her most recent was a couple of years ago.   INTERVAL HISTORY:  Kirsten Williams is here today for follow up of the plan for referral to pulmonology. She has not heard from the [facility]  office regarding a referral placed two weeks ago. The office states they have reached out to the patient on 3 different occasions and did not receive a call back, so the referral was cancelled. I verified the patient's phone number with both her and the office. She will wait for their call this week. She continues to feel well. She denies fever, chills, nausea or vomiting. She denies issue with bowel or bladder. She denies shortness of breath, chest pain or cough.   REVIEW OF SYSTEMS:  Review of Systems  Constitutional: Negative for appetite change, chills, diaphoresis, fatigue, fever and unexpected weight change.  HENT:   Negative for hearing loss, lump/mass, mouth sores, nosebleeds, sore throat, tinnitus, trouble swallowing and voice change.   Eyes: Negative for eye problems and icterus.  Respiratory: Negative for chest tightness, cough, hemoptysis, shortness of breath and wheezing.   Cardiovascular: Negative for chest pain, leg swelling and palpitations.  Gastrointestinal: Negative for abdominal distention, abdominal pain, blood in stool, constipation, diarrhea, nausea, rectal pain and vomiting.  Endocrine: Negative for hot flashes.  Genitourinary: Negative for bladder incontinence, difficulty urinating, dyspareunia, dysuria, frequency, hematuria and nocturia.   Musculoskeletal: Negative for arthralgias, back pain, flank pain, gait problem, myalgias, neck pain and neck stiffness.  Skin: Negative for itching, rash and wound.  Neurological: Negative for dizziness, extremity weakness, gait problem, headaches, light-headedness, numbness, seizures and speech difficulty.  Hematological: Negative for adenopathy. Does not bruise/bleed easily.  Psychiatric/Behavioral: Negative for confusion, decreased concentration, depression, sleep disturbance and suicidal ideas. The patient is not nervous/anxious.      VITALS:  Blood pressure 123/60, pulse 82, temperature 98.1 F (  36.7 C), temperature source Oral,  resp. rate 18, height 5\' 1"  (1.549 m), weight 115 lb 14.4 oz (52.6 kg), SpO2 100 %.  Wt Readings from Last 3 Encounters:  03/27/21 115 lb 14.4 oz (52.6 kg)  03/13/21 116 lb 14.4 oz (53 kg)    Body mass index is 21.9 kg/m.  Performance status (ECOG): 1 - Symptomatic but completely ambulatory  PHYSICAL EXAM:  Physical Exam Constitutional:      General: She is not in acute distress.    Appearance: Normal appearance. She is normal weight. She is not ill-appearing, toxic-appearing or diaphoretic.  HENT:     Head: Normocephalic and atraumatic.     Nose: Nose normal. No congestion or rhinorrhea.     Mouth/Throat:     Mouth: Mucous membranes are moist.     Pharynx: Oropharynx is clear. No oropharyngeal exudate or posterior oropharyngeal erythema.  Eyes:     General: No scleral icterus.       Right eye: No discharge.        Left eye: No discharge.     Extraocular Movements: Extraocular movements intact.     Conjunctiva/sclera: Conjunctivae normal.     Pupils: Pupils are equal, round, and reactive to light.  Neck:     Vascular: No carotid bruit.  Cardiovascular:     Rate and Rhythm: Normal rate and regular rhythm.     Heart sounds: No murmur heard. No friction rub. No gallop.   Pulmonary:     Effort: Pulmonary effort is normal. No respiratory distress.     Breath sounds: Normal breath sounds. No stridor. No wheezing, rhonchi or rales.  Chest:     Chest wall: No tenderness.  Abdominal:     General: Abdomen is flat. Bowel sounds are normal. There is no distension.     Palpations: There is no mass.     Tenderness: There is no abdominal tenderness. There is no right CVA tenderness, left CVA tenderness, guarding or rebound.     Hernia: No hernia is present.  Musculoskeletal:        General: No swelling, tenderness, deformity or signs of injury. Normal range of motion.     Cervical back: Normal range of motion and neck supple. No rigidity or tenderness.     Right lower leg: No edema.      Left lower leg: No edema.  Lymphadenopathy:     Cervical: No cervical adenopathy.  Skin:    General: Skin is warm and dry.     Capillary Refill: Capillary refill takes less than 2 seconds.     Coloration: Skin is not jaundiced or pale.     Findings: No bruising, erythema, lesion or rash.  Neurological:     General: No focal deficit present.     Mental Status: She is alert and oriented to person, place, and time. Mental status is at baseline.     Cranial Nerves: No cranial nerve deficit.     Sensory: No sensory deficit.     Motor: No weakness.     Coordination: Coordination normal.     Gait: Gait normal.     Deep Tendon Reflexes: Reflexes normal.  Psychiatric:        Mood and Affect: Mood normal.        Behavior: Behavior normal.        Thought Content: Thought content normal.        Judgment: Judgment normal.     LABS:   CBC Latest Ref Rng &  Units 03/13/2021  WBC - 6.4  Hemoglobin 12.0 - 16.0 13.5  Hematocrit 36 - 46 40  Platelets 150 - 399 223   CMP Latest Ref Rng & Units 03/13/2021  BUN 4 - 21 10  Creatinine 0.5 - 1.1 0.6  Sodium 137 - 147 140  Potassium 3.4 - 5.3 3.9  Chloride 99 - 108 105  CO2 13 - 22 26(A)  Calcium 8.7 - 10.7 9.2  Alkaline Phos 25 - 125 89  AST 13 - 35 50(A)  ALT 7 - 35 51(A)     No results found for: CEA1 / No results found for: CEA1 No results found for: PSA1 No results found for: HEN277 No results found for: OEU235  No results found for: TOTALPROTELP, ALBUMINELP, A1GS, A2GS, BETS, BETA2SER, GAMS, MSPIKE, SPEI No results found for: TIBC, FERRITIN, IRONPCTSAT No results found for: LDH  STUDIES:  No results found.    HISTORY:   Past Medical History:  Diagnosis Date  . Anxiety   . GERD (gastroesophageal reflux disease)       Family History  Problem Relation Age of Onset  . Stroke Mother   . Stroke Father   . Cancer Father   . Hypertension Sister     Social History:  reports that she quit smoking about 6 years ago. Her  smoking use included cigarettes. She has a 48.00 pack-year smoking history. She has never used smokeless tobacco. She reports previous alcohol use. No history on file for drug use.The patient is alone  today.  Allergies: No Known Allergies  Current Medications: Current Outpatient Medications  Medication Sig Dispense Refill  . cyclobenzaprine (FLEXERIL) 10 MG tablet Take 1 tablet by mouth at bedtime.    . dicyclomine (BENTYL) 10 MG capsule Take 10 mg by mouth 4 (four) times daily -  before meals and at bedtime.    . hydroxychloroquine (PLAQUENIL) 200 MG tablet Take 200 mg by mouth 2 (two) times daily.    . Multiple Vitamin (MULTIVITAMIN) tablet Take 1 tablet by mouth daily.    Marland Kitchen omeprazole (PRILOSEC) 40 MG capsule Take 40 mg by mouth daily.    . ondansetron (ZOFRAN) 4 MG tablet Take 8 mg by mouth 2 (two) times daily as needed.    . rizatriptan (MAXALT) 10 MG tablet Take 10 mg by mouth as needed. May repeat in 2 hours if needed     No current facility-administered medications for this visit.     ASSESSMENT & PLAN:   Assessment:  Kirsten Williams is a 50 y.o. female with a history of lung nodules that are now mildly hypermetabolic on PET imaging. We reviewed the CT and PET reports in detail. We discussed the size of the lung nodules increasing slightly, but also more solid in appearance. We would like Pulmonary's assistance in evaluating the patient and possible biopsy. She understands that our concern is rule out malignancy and she is agreeable to referral. She has not heard from the office as of today. I spoke with the office who states they reached out to her multiple times without success.   Plan: She is still agreeable to referral. I verified her phone number with the office and patient. She knows to be expecting a call this week regarding referral. If she has not received an appointment by Thursday, I will contact the office again for her.   The patient understands the plans discussed  today and is in agreement with them.  She knows to contact our office  if she develops concerns prior to her next appointment.     Melodye Ped, NP

## 2021-04-12 ENCOUNTER — Other Ambulatory Visit: Payer: Self-pay

## 2021-04-12 ENCOUNTER — Encounter: Payer: Self-pay | Admitting: Emergency Medicine

## 2021-04-12 ENCOUNTER — Ambulatory Visit (INDEPENDENT_AMBULATORY_CARE_PROVIDER_SITE_OTHER): Payer: Commercial Managed Care - PPO | Admitting: Emergency Medicine

## 2021-04-12 VITALS — BP 112/70 | HR 80 | Temp 98.1°F | Ht 61.0 in | Wt 116.6 lb

## 2021-04-12 DIAGNOSIS — R918 Other nonspecific abnormal finding of lung field: Secondary | ICD-10-CM | POA: Diagnosis not present

## 2021-04-12 LAB — CBC WITH DIFFERENTIAL/PLATELET
Basophils Absolute: 0.1 10*3/uL (ref 0.0–0.1)
Basophils Relative: 0.7 % (ref 0.0–3.0)
Eosinophils Absolute: 0.2 10*3/uL (ref 0.0–0.7)
Eosinophils Relative: 2.8 % (ref 0.0–5.0)
HCT: 41.1 % (ref 36.0–46.0)
Hemoglobin: 14 g/dL (ref 12.0–15.0)
Lymphocytes Relative: 38.2 % (ref 12.0–46.0)
Lymphs Abs: 3 10*3/uL (ref 0.7–4.0)
MCHC: 34.1 g/dL (ref 30.0–36.0)
MCV: 91.5 fl (ref 78.0–100.0)
Monocytes Absolute: 0.5 10*3/uL (ref 0.1–1.0)
Monocytes Relative: 6.1 % (ref 3.0–12.0)
Neutro Abs: 4.1 10*3/uL (ref 1.4–7.7)
Neutrophils Relative %: 52.2 % (ref 43.0–77.0)
Platelets: 242 10*3/uL (ref 150.0–400.0)
RBC: 4.5 Mil/uL (ref 3.87–5.11)
RDW: 13.5 % (ref 11.5–15.5)
WBC: 7.9 10*3/uL (ref 4.0–10.5)

## 2021-04-12 LAB — BASIC METABOLIC PANEL
BUN: 11 mg/dL (ref 6–23)
CO2: 30 mEq/L (ref 19–32)
Calcium: 9.9 mg/dL (ref 8.4–10.5)
Chloride: 104 mEq/L (ref 96–112)
Creatinine, Ser: 0.72 mg/dL (ref 0.40–1.20)
GFR: 97.38 mL/min (ref >60.00)
Glucose, Bld: 90 mg/dL (ref 70–99)
Potassium: 4.8 mEq/L (ref 3.5–5.1)
Sodium: 138 mEq/L (ref 135–145)

## 2021-04-12 LAB — PROTIME-INR
INR: 1 ratio (ref 0.8–1.0)
Prothrombin Time: 11.1 s (ref 9.6–13.1)

## 2021-04-12 NOTE — Assessment & Plan Note (Signed)
Multiple mixed density pulmonary nodules that have been appropriately followed and have not changed in size and consistency over time with scans dating back to 2019.  At least moderate suspicion for malignancy given her tobacco history.  Discussed the options with her and I have recommended navigational bronchoscopy.  She understands and agrees.  We will try to get this set up as soon as possible  We will work on setting up a navigational bronchoscopy to sample your pulmonary nodules.  We will try to get this scheduled during the next 2 weeks.  This will be done as an outpatient at [facility] will need a designated driver since it will be done under general anesthesia. Follow with Dr Lamonte Sakai in 1 month

## 2021-04-12 NOTE — Progress Notes (Signed)
Subjective:    Patient ID: Kirsten Williams, female    DOB: 09/26/1971, 50 y.o.   MRN: [id number]  HPI 50 year old woman with a 51-pack-year former tobacco use, history of GERD, anxiety, migraines, cutaneous lupus on Plaquanil, chronic nausea and cough.  She is been followed by primary physician for stable pulmonary nodules that date back to 2019.   She reports that she feels well, she has some occasional cough. Never hemoptysis. No CP. Rash is well controlled.   Multiple CT scans of the chest reviewed from 2019, most recent was 02/16/2021 that showed some apical scar, a 2 cm groundglass left upper lobe nodule slightly increased in size now with 11 mm of solid component, 10 mm solid posterior right upper lobe nodule that has increased in size from 7 mm.  There is a 88mm superior segmental right lower lobe nodule with a 10 mm predominant solid component that is slightly enlarged as well. A PET scan performed 03/06/2021 reviewed by me shows mild hypermetabolism in the right upper lobe nodule SUV 2.5, no significant hypermetabolism in the other nodules all of which were stable in size.  Mild hypermetabolic lower paratracheal lymph node.      Review of Systems As per HPI  Past Medical History:  Diagnosis Date  . Anxiety   . GERD (gastroesophageal reflux disease)   Migraines Cutaneous lupus   Family History  Problem Relation Age of Onset  . Stroke Mother   . Stroke Father   . Cancer Father   . Hypertension Sister      Social History   Socioeconomic History  . Marital status: Married    Spouse name: Not on file  . Number of children: Not on file  . Years of education: Not on file  . Highest education level: Not on file  Occupational History  . Not on file  Tobacco Use  . Smoking status: Former Smoker    Packs/day: 1.50    Years: 32.00    Pack years: 48.00    Types: Cigarettes    Quit date: 12/10/2014    Years since quitting: 6.3  . Smokeless tobacco: Never Used  Vaping Use   . Vaping Use: Never used  Substance and Sexual Activity  . Alcohol use: Not Currently  . Drug use: Not on file  . Sexual activity: Yes  Other Topics Concern  . Not on file  Social History Narrative  . Not on file   Social Determinants of Health   Financial Resource Strain: Not on file  Food Insecurity: Not on file  Transportation Needs: Not on file  Physical Activity: Not on file  Stress: Not on file  Social Connections: Not on file  Intimate Partner Violence: Not on file     No Known Allergies   Outpatient Medications Prior to Visit  Medication Sig Dispense Refill  . cyclobenzaprine (FLEXERIL) 10 MG tablet Take 1 tablet by mouth at bedtime.    . dicyclomine (BENTYL) 10 MG capsule Take 10 mg by mouth 4 (four) times daily -  before meals and at bedtime.    . hydroxychloroquine (PLAQUENIL) 200 MG tablet Take 200 mg by mouth 2 (two) times daily.    . Multiple Vitamin (MULTIVITAMIN) tablet Take 1 tablet by mouth daily.    Marland Kitchen omeprazole (PRILOSEC) 40 MG capsule Take 40 mg by mouth daily.    . ondansetron (ZOFRAN) 4 MG tablet Take 8 mg by mouth 2 (two) times daily as needed.    . rizatriptan (  MAXALT) 10 MG tablet Take 10 mg by mouth as needed. May repeat in 2 hours if needed     No facility-administered medications prior to visit.        Objective:   Physical Exam  Vitals:   04/12/21 0934  BP: 112/70  Pulse: 80  Temp: 98.1 F (36.7 C)  TempSrc: Temporal  SpO2: 98%  Weight: 116 lb 9.6 oz (52.9 kg)  Height: 5\' 1"  (1.549 m)   Gen: Pleasant, well-nourished, in no distress,  normal affect  ENT: No lesions,  mouth clear,  oropharynx clear, no postnasal drip  Neck: No JVD, no stridor  Lungs: No use of accessory muscles, no crackles or wheezing on normal respiration, no wheeze on forced expiration  Cardiovascular: RRR, heart sounds normal, no murmur or gallops, no peripheral edema  Musculoskeletal: No deformities, no cyanosis or clubbing  Neuro: alert, awake, non  focal  Skin: Warm, no lesions or rash     Assessment & Plan:  Pulmonary nodules/lesions, multiple Multiple mixed density pulmonary nodules that have been appropriately followed and have not changed in size and consistency over time with scans dating back to 2019.  At least moderate suspicion for malignancy given her tobacco history.  Discussed the options with her and I have recommended navigational bronchoscopy.  She understands and agrees.  We will try to get this set up as soon as possible  We will work on setting up a navigational bronchoscopy to sample your pulmonary nodules.  We will try to get this scheduled during the next 2 weeks.  This will be done as an outpatient at [facility] will need a designated driver since it will be done under general anesthesia. Follow with Dr Lamonte Sakai in 1 month   Baltazar Apo, MD, PhD 04/12/2021, 1:33 PM [facility] [phone] or if no answer before 7:00PM call [phone] For any issues after 7:00PM please call eLink [phone]

## 2021-04-12 NOTE — Patient Instructions (Signed)
We will work on setting up a navigational bronchoscopy to sample your pulmonary nodules.  We will try to get this scheduled during the next 2 weeks.  This will be done as an outpatient at [facility] will need a designated driver since it will be done under general anesthesia. Follow with Dr Lamonte Sakai in 1 month

## 2021-04-12 NOTE — H&P (View-Only) (Signed)
Subjective:    Patient ID: Kirsten Williams, female    DOB: 10/06/1971, 50 y.o.   MRN: [id number]  HPI 50 year old woman with a 48-pack-year former tobacco use, history of GERD, anxiety, migraines, cutaneous lupus on Plaquanil, chronic nausea and cough.  She is been followed by primary physician for stable pulmonary nodules that date back to 2019.   She reports that she feels well, she has some occasional cough. Never hemoptysis. No CP. Rash is well controlled.   Multiple CT scans of the chest reviewed from 2019, most recent was 02/16/2021 that showed some apical scar, a 2 cm groundglass left upper lobe nodule slightly increased in size now with 11 mm of solid component, 10 mm solid posterior right upper lobe nodule that has increased in size from 7 mm.  There is a 72mm superior segmental right lower lobe nodule with a 10 mm predominant solid component that is slightly enlarged as well. A PET scan performed 03/06/2021 reviewed by me shows mild hypermetabolism in the right upper lobe nodule SUV 2.5, no significant hypermetabolism in the other nodules all of which were stable in size.  Mild hypermetabolic lower paratracheal lymph node.      Review of Systems As per HPI  Past Medical History:  Diagnosis Date  . Anxiety   . GERD (gastroesophageal reflux disease)   Migraines Cutaneous lupus   Family History  Problem Relation Age of Onset  . Stroke Mother   . Stroke Father   . Cancer Father   . Hypertension Sister      Social History   Socioeconomic History  . Marital status: Married    Spouse name: Not on file  . Number of children: Not on file  . Years of education: Not on file  . Highest education level: Not on file  Occupational History  . Not on file  Tobacco Use  . Smoking status: Former Smoker    Packs/day: 1.50    Years: 32.00    Pack years: 48.00    Types: Cigarettes    Quit date: 12/10/2014    Years since quitting: 6.3  . Smokeless tobacco: Never Used  Vaping Use   . Vaping Use: Never used  Substance and Sexual Activity  . Alcohol use: Not Currently  . Drug use: Not on file  . Sexual activity: Yes  Other Topics Concern  . Not on file  Social History Narrative  . Not on file   Social Determinants of Health   Financial Resource Strain: Not on file  Food Insecurity: Not on file  Transportation Needs: Not on file  Physical Activity: Not on file  Stress: Not on file  Social Connections: Not on file  Intimate Partner Violence: Not on file     No Known Allergies   Outpatient Medications Prior to Visit  Medication Sig Dispense Refill  . cyclobenzaprine (FLEXERIL) 10 MG tablet Take 1 tablet by mouth at bedtime.    . dicyclomine (BENTYL) 10 MG capsule Take 10 mg by mouth 4 (four) times daily -  before meals and at bedtime.    . hydroxychloroquine (PLAQUENIL) 200 MG tablet Take 200 mg by mouth 2 (two) times daily.    . Multiple Vitamin (MULTIVITAMIN) tablet Take 1 tablet by mouth daily.    Marland Kitchen omeprazole (PRILOSEC) 40 MG capsule Take 40 mg by mouth daily.    . ondansetron (ZOFRAN) 4 MG tablet Take 8 mg by mouth 2 (two) times daily as needed.    . rizatriptan (  MAXALT) 10 MG tablet Take 10 mg by mouth as needed. May repeat in 2 hours if needed     No facility-administered medications prior to visit.        Objective:   Physical Exam  Vitals:   04/12/21 0934  BP: 112/70  Pulse: 80  Temp: 98.1 F (36.7 C)  TempSrc: Temporal  SpO2: 98%  Weight: 116 lb 9.6 oz (52.9 kg)  Height: 5\' 1"  (1.549 m)   Gen: Pleasant, well-nourished, in no distress,  normal affect  ENT: No lesions,  mouth clear,  oropharynx clear, no postnasal drip  Neck: No JVD, no stridor  Lungs: No use of accessory muscles, no crackles or wheezing on normal respiration, no wheeze on forced expiration  Cardiovascular: RRR, heart sounds normal, no murmur or gallops, no peripheral edema  Musculoskeletal: No deformities, no cyanosis or clubbing  Neuro: alert, awake, non  focal  Skin: Warm, no lesions or rash     Assessment & Plan:  Pulmonary nodules/lesions, multiple Multiple mixed density pulmonary nodules that have been appropriately followed and have not changed in size and consistency over time with scans dating back to 2019.  At least moderate suspicion for malignancy given her tobacco history.  Discussed the options with her and I have recommended navigational bronchoscopy.  She understands and agrees.  We will try to get this set up as soon as possible  We will work on setting up a navigational bronchoscopy to sample your pulmonary nodules.  We will try to get this scheduled during the next 2 weeks.  This will be done as an outpatient at [facility] will need a designated driver since it will be done under general anesthesia. Follow with Dr Lamonte Sakai in 1 month   Baltazar Apo, MD, PhD 04/12/2021, 1:33 PM [facility] [phone] or if no answer before 7:00PM call [phone] For any issues after 7:00PM please call eLink [phone]

## 2021-04-13 ENCOUNTER — Telehealth: Payer: Self-pay | Admitting: Emergency Medicine

## 2021-04-13 NOTE — Telephone Encounter (Signed)
CT Super D @ [facility] scheduled for 04/18/21 @ 1:30pm pt will get a copy of the disk and bring to [facility]. Covid test 04/21/21 @ 10:10, [facility] 04/24/2021 @ 12:30p Booking # [id number]

## 2021-04-20 ENCOUNTER — Telehealth: Payer: Self-pay | Admitting: Emergency Medicine

## 2021-04-20 ENCOUNTER — Encounter (HOSPITAL_COMMUNITY): Payer: Self-pay | Admitting: Emergency Medicine

## 2021-04-20 NOTE — Telephone Encounter (Signed)
Called and spoke with patient. She wanted to know if she needed to make a special trip to drop off the disk before her procedure. I advised her that she can bring the disk with her the day of her procedure. She verbalized understanding.   Nothing further needed.

## 2021-04-20 NOTE — Progress Notes (Signed)
EKG: na CXR: na ECHO: denies Stress Test: denies Cardiac Cath: denies  Fasting Blood Sugar- na Checks Blood Sugar__na_ times a day  OSA/CPAP: No  ASA/Blood Thinner: No  Covid test 04/21/21  Anesthesia Review: No  Patient denies shortness of breath, fever, cough, and chest pain at PAT appointment.  Patient verbalized understanding of instructions provided today at the PAT appointment.  Patient asked to review instructions at home and day of surgery.

## 2021-04-21 ENCOUNTER — Other Ambulatory Visit (HOSPITAL_COMMUNITY)
Admission: RE | Admit: 2021-04-21 | Discharge: 2021-04-21 | Disposition: A | Discharge status: Home / Self Care | Payer: Commercial Managed Care - PPO | Source: Ambulatory Visit | Attending: Emergency Medicine | Admitting: Emergency Medicine

## 2021-04-21 DIAGNOSIS — Z20822 Contact with and (suspected) exposure to covid-19: Secondary | ICD-10-CM | POA: Diagnosis not present

## 2021-04-21 DIAGNOSIS — Z01812 Encounter for preprocedural laboratory examination: Secondary | ICD-10-CM | POA: Diagnosis present

## 2021-04-22 LAB — SARS CORONAVIRUS 2 (TAT 6-24 HRS): SARS Coronavirus 2: NEGATIVE

## 2021-04-24 ENCOUNTER — Encounter (HOSPITAL_COMMUNITY): Payer: Self-pay | Admitting: Emergency Medicine

## 2021-04-24 ENCOUNTER — Ambulatory Visit (HOSPITAL_COMMUNITY): Payer: Commercial Managed Care - PPO | Admitting: Certified Registered Nurse Anesthetist

## 2021-04-24 ENCOUNTER — Ambulatory Visit (HOSPITAL_COMMUNITY): Payer: Commercial Managed Care - PPO

## 2021-04-24 ENCOUNTER — Ambulatory Visit (HOSPITAL_COMMUNITY)
Admission: RE | Admit: 2021-04-24 | Discharge: 2021-04-24 | Disposition: A | Discharge status: Home / Self Care | Payer: Commercial Managed Care - PPO | Source: Ambulatory Visit | Attending: Emergency Medicine | Admitting: Emergency Medicine

## 2021-04-24 ENCOUNTER — Encounter (HOSPITAL_COMMUNITY)
Admission: RE | Disposition: A | Discharge status: Home / Self Care | Payer: Self-pay | Source: Ambulatory Visit | Attending: Emergency Medicine

## 2021-04-24 DIAGNOSIS — R918 Other nonspecific abnormal finding of lung field: Secondary | ICD-10-CM

## 2021-04-24 DIAGNOSIS — R590 Localized enlarged lymph nodes: Secondary | ICD-10-CM | POA: Diagnosis not present

## 2021-04-24 DIAGNOSIS — C3411 Malignant neoplasm of upper lobe, right bronchus or lung: Secondary | ICD-10-CM | POA: Insufficient documentation

## 2021-04-24 DIAGNOSIS — Z8249 Family history of ischemic heart disease and other diseases of the circulatory system: Secondary | ICD-10-CM | POA: Diagnosis not present

## 2021-04-24 DIAGNOSIS — Z87891 Personal history of nicotine dependence: Secondary | ICD-10-CM | POA: Diagnosis not present

## 2021-04-24 DIAGNOSIS — Z809 Family history of malignant neoplasm, unspecified: Secondary | ICD-10-CM | POA: Insufficient documentation

## 2021-04-24 DIAGNOSIS — Z823 Family history of stroke: Secondary | ICD-10-CM | POA: Insufficient documentation

## 2021-04-24 DIAGNOSIS — I509 Heart failure, unspecified: Secondary | ICD-10-CM

## 2021-04-24 DIAGNOSIS — K219 Gastro-esophageal reflux disease without esophagitis: Secondary | ICD-10-CM | POA: Insufficient documentation

## 2021-04-24 DIAGNOSIS — Z419 Encounter for procedure for purposes other than remedying health state, unspecified: Secondary | ICD-10-CM

## 2021-04-24 HISTORY — PX: BRONCHIAL BIOPSY: SHX5109

## 2021-04-24 HISTORY — PX: FIDUCIAL MARKER PLACEMENT: SHX6858

## 2021-04-24 HISTORY — PX: BRONCHIAL WASHINGS: SHX5105

## 2021-04-24 HISTORY — PX: BRONCHIAL NEEDLE ASPIRATION BIOPSY: SHX5106

## 2021-04-24 HISTORY — PX: VIDEO BRONCHOSCOPY WITH ENDOBRONCHIAL NAVIGATION: SHX6175

## 2021-04-24 HISTORY — PX: BRONCHIAL BRUSHINGS: SHX5108

## 2021-04-24 SURGERY — VIDEO BRONCHOSCOPY WITH ENDOBRONCHIAL NAVIGATION
Anesthesia: General | Laterality: Bilateral

## 2021-04-24 MED ORDER — OXYCODONE HCL 5 MG PO TABS
5.0000 mg | ORAL_TABLET | Freq: Once | ORAL | Status: DC | PRN
Start: 2021-04-24 — End: 2021-04-24

## 2021-04-24 MED ORDER — DEXAMETHASONE SODIUM PHOSPHATE 10 MG/ML IJ SOLN
INTRAMUSCULAR | Status: DC | PRN
  Administered 2021-04-24: 10 mg via INTRAVENOUS

## 2021-04-24 MED ORDER — FENTANYL CITRATE (PF) 250 MCG/5ML IJ SOLN
INTRAMUSCULAR | Status: DC | PRN
  Administered 2021-04-24: 50 ug via INTRAVENOUS
  Administered 2021-04-24: 25 ug via INTRAVENOUS
  Administered 2021-04-24: 100 ug via INTRAVENOUS

## 2021-04-24 MED ORDER — LIDOCAINE 2% (20 MG/ML) 5 ML SYRINGE
INTRAMUSCULAR | Status: DC | PRN
  Administered 2021-04-24: 100 mg via INTRAVENOUS

## 2021-04-24 MED ORDER — OXYCODONE HCL 5 MG/5ML PO SOLN: 5.0000 mg | Freq: Once | ORAL | Status: DC | PRN

## 2021-04-24 MED ORDER — CHLORHEXIDINE GLUCONATE 0.12 % MT SOLN
15.0000 mL | Freq: Once | OROMUCOSAL | Status: AC
  Administered 2021-04-24: 15 mL via OROMUCOSAL
  Filled 2021-04-24 (×2): qty 15

## 2021-04-24 MED ORDER — ONDANSETRON HCL 4 MG/2ML IJ SOLN
INTRAMUSCULAR | Status: AC
  Filled 2021-04-24: qty 2

## 2021-04-24 MED ORDER — FENTANYL CITRATE (PF) 100 MCG/2ML IJ SOLN: 25.0000 ug | INTRAMUSCULAR | Status: DC | PRN

## 2021-04-24 MED ORDER — MIDAZOLAM HCL 5 MG/5ML IJ SOLN
INTRAMUSCULAR | Status: DC | PRN
  Administered 2021-04-24: 2 mg via INTRAVENOUS

## 2021-04-24 MED ORDER — LACTATED RINGERS IV SOLN: INTRAVENOUS | Status: DC

## 2021-04-24 MED ORDER — ONDANSETRON HCL 4 MG/2ML IJ SOLN
4.0000 mg | Freq: Four times a day (QID) | INTRAMUSCULAR | Status: AC | PRN
Start: 2021-04-24 — End: 2021-04-24
  Administered 2021-04-24: 4 mg via INTRAVENOUS

## 2021-04-24 MED ORDER — PROPOFOL 10 MG/ML IV BOLUS
INTRAVENOUS | Status: DC | PRN
  Administered 2021-04-24: 200 mg via INTRAVENOUS

## 2021-04-24 MED ORDER — ONDANSETRON HCL 4 MG/2ML IJ SOLN
INTRAMUSCULAR | Status: DC | PRN
  Administered 2021-04-24: 4 mg via INTRAVENOUS

## 2021-04-24 MED ORDER — ROCURONIUM BROMIDE 100 MG/10ML IV SOLN
INTRAVENOUS | Status: DC | PRN
  Administered 2021-04-24: 60 mg via INTRAVENOUS

## 2021-04-24 MED ORDER — SUGAMMADEX SODIUM 200 MG/2ML IV SOLN
INTRAVENOUS | Status: DC | PRN
  Administered 2021-04-24: 200 mg via INTRAVENOUS

## 2021-04-24 SURGICAL SUPPLY — 2 items
SuperLock fiducial marker ×2 IMPLANT
Superlock fiducial marker ×6 IMPLANT

## 2021-04-24 NOTE — Telephone Encounter (Signed)
Pt had bronch performed today 5/16.nothing further needed.

## 2021-04-24 NOTE — Op Note (Addendum)
Video Bronchoscopy with Electromagnetic Navigation Procedure Note  Date of Operation: 04/24/2021  Pre-op Diagnosis: Bilateral pulmonary nodules, paratracheal adenopathy  Post-op Diagnosis: Same  Surgeon: Baltazar Apo  Assistants: None  Anesthesia: General endotracheal anesthesia  Operation: Flexible video fiberoptic bronchoscopy with electromagnetic navigation and biopsies.  Estimated Blood Loss: Minimal  Complications: None apparent  Indications and History: Kirsten Williams is a 50 y.o. female with history of tobacco abuse.  She has a slowly enlarging mixed density pulmonary nodules bilaterally as well as a right paratracheal node.  Recommendation was made to achieve a tissue diagnosis via navigational bronchoscopy and endobronchial ultrasound.  The risks, benefits, complications, treatment options and expected outcomes were discussed with the patient.  The possibilities of pneumothorax, pneumonia, reaction to medication, pulmonary aspiration, perforation of a viscus, bleeding, failure to diagnose a condition and creating a complication requiring transfusion or operation were discussed with the patient who freely signed the consent.    Description of Procedure: The patient was seen in the Preoperative Area, was examined and was deemed appropriate to proceed.  The patient was taken to [facility] endoscopy room 2, identified as Kirsten Williams and the procedure verified as Flexible Video Fiberoptic Bronchoscopy.  A Time Out was held and the above information confirmed.   Prior to the date of the procedure a high-resolution CT scan of the chest was performed. Utilizing [place] a virtual tracheobronchial tree was generated to allow the creation of distinct navigation pathways to the patient's parenchymal abnormalities. After being taken to the operating room general anesthesia was initiated and the patient  was orally intubated. The video fiberoptic bronchoscope was introduced  via the endotracheal tube and a general inspection was performed which showed normal airways throughout.  There are no endobronchial lesions. The extendable working channel and locator guide were introduced into the bronchoscope. The distinct navigation pathways prepared prior to this procedure were then utilized to navigate to within 0.3-1.0 cm of patient's lesion(s) identified on CT scan.   -Target 1 right upper lobe nodule -Target 2 right lower lobe superior segment nodule -Target 3 left upper lobe groundglass nodule  The extendable working channel was secured into place at each location and the locator guide was withdrawn. Under fluoroscopic guidance transbronchial brushings, transbronchial Wang needle biopsies, and transbronchial forceps biopsies were performed to be sent for cytology and pathology. Bronchioalveolar lavage was performed in the right lower lobe and left upper lobe (target 2, target 3) and sent for cytology.   Fiducial markers were placed adjacent to all 3 targets.  2 markers in the right upper lobe, 2 markers in the right lower lobe and 1 marker in the left upper lobe adjacent to these abnormalities.  There was a right pretracheal lymph node at station 4 on imaging that would have been a target for transbronchial needle biopsy.  EBUS could not be performed because the largest endotracheal tube that could be used to support the patient was 8.0 and the scope would not pass.  At the end of the procedure a general airway inspection was performed and there was no evidence of active bleeding. The bronchoscope was removed.  The patient tolerated the procedure well. There was no significant blood loss and there were no obvious complications. A post-procedural chest x-ray is pending.  Samples: 1. Transbronchial brushings from right upper lobe nodule (1), right lower lobe nodule (2), left upper lobe nodule (3) 2. Transbronchial Wang needle biopsies from right upper lobe nodule (1), right  lower lobe nodule (  2), left upper lobe nodule (3) 3. Transbronchial forceps biopsies from right upper lobe nodule (1), right lower lobe nodule (2), left upper lobe nodule (3) 4. Bronchoalveolar lavage from right lower lobe (2), left upper lobe (3)  Plans:  The patient will be discharged from the PACU to home when recovered from anesthesia and after chest x-ray is reviewed. We will review the cytology, pathology and microbiology results with the patient when they become available. Outpatient followup will be with Dr. Lamonte Sakai.    Kirsten Horseman S. 04/24/2021

## 2021-04-24 NOTE — Anesthesia Procedure Notes (Addendum)
Procedure Name: Intubation Date/Time: 04/24/2021 1:15 PM Performed by: Janene Harvey, CRNA Pre-anesthesia Checklist: Patient identified, Emergency Drugs available, Suction available and Patient being monitored Patient Re-evaluated:Patient Re-evaluated prior to induction Oxygen Delivery Method: Circle system utilized Preoxygenation: Pre-oxygenation with 100% oxygen Induction Type: IV induction Ventilation: Mask ventilation without difficulty and Oral airway inserted - appropriate to patient size Laryngoscope Size: Glidescope and 3 Grade View: Grade II Tube type: Oral Tube size: 8.0 mm Number of attempts: 1 Airway Equipment and Method: Stylet,  Oral airway and Video-laryngoscopy Placement Confirmation: ETT inserted through vocal cords under direct vision,  positive ETCO2 and breath sounds checked- equal and bilateral Secured at: 23 cm Tube secured with: Tape Dental Injury: Teeth and Oropharynx as per pre-operative assessment  Comments: 1 DL by SRNA with MAC 4, grade IIb view, unable to pass 8.5 ETT, SRNA DL with Miller 2, unable to pass ett. Pt easily masked with sevo. DL with Sabra Heck 2 by MDA, unable to pass 8.5 ETT. Glidescope 3 used to pass 8.0 ETT by MDA.

## 2021-04-24 NOTE — Anesthesia Preprocedure Evaluation (Addendum)
Anesthesia Evaluation  Patient identified by MRN, date of birth, ID band Patient awake    Reviewed: Allergy & Precautions, H&P , NPO status , Patient's Chart, lab work & pertinent test results  Airway Mallampati: II   Neck ROM: full    Dental   Pulmonary former smoker,    breath sounds clear to auscultation       Cardiovascular negative cardio ROS   Rhythm:regular Rate:Normal     Neuro/Psych  Headaches, PSYCHIATRIC DISORDERS Anxiety    GI/Hepatic GERD  ,  Endo/Other    Renal/GU      Musculoskeletal   Abdominal   Peds  Hematology   Anesthesia Other Findings   Reproductive/Obstetrics                             Anesthesia Physical Anesthesia Plan  ASA: II  Anesthesia Plan: General   Post-op Pain Management:    Induction: Intravenous  PONV Risk Score and Plan: 3 and Ondansetron, Dexamethasone, Midazolam and Treatment may vary due to age or medical condition  Airway Management Planned: Oral ETT  Additional Equipment:   Intra-op Plan:   Post-operative Plan: Extubation in OR  Informed Consent: I have reviewed the patients History and Physical, chart, labs and discussed the procedure including the risks, benefits and alternatives for the proposed anesthesia with the patient or authorized representative who has indicated his/her understanding and acceptance.     Dental advisory given  Plan Discussed with: CRNA, Anesthesiologist and Surgeon  Anesthesia Plan Comments:         Anesthesia Quick Evaluation

## 2021-04-24 NOTE — Discharge Instructions (Signed)
Flexible Bronchoscopy, Care After This sheet gives you information about how to care for yourself after your test. Your doctor may also give you more specific instructions. If you have problems or questions, contact your doctor. Follow these instructions at home: Eating and drinking  Do not eat or drink anything (not even water) for 2 hours after your test, or until your numbing medicine (local anesthetic) wears off.  When your numbness is gone and your cough and gag reflexes have come back, you may: ? Eat only soft foods. ? Slowly drink liquids.  The day after the test, go back to your normal diet. Driving  Do not drive for 24 hours if you were given a medicine to help you relax (sedative).  Do not drive or use heavy machinery while taking prescription pain medicine. General instructions   Take over-the-counter and prescription medicines only as told by your doctor.  Return to your normal activities as told. Ask what activities are safe for you.  Do not use any products that have nicotine or tobacco in them. This includes cigarettes and e-cigarettes. If you need help quitting, ask your doctor.  Keep all follow-up visits as told by your doctor. This is important. It is very important if you had a tissue sample (biopsy) taken. Get help right away if:  You have shortness of breath that gets worse.  You get light-headed.  You feel like you are going to pass out (faint).  You have chest pain.  You cough up: ? More than a little blood. ? More blood than before. Summary  Do not eat or drink anything (not even water) for 2 hours after your test, or until your numbing medicine wears off.  Do not use cigarettes. Do not use e-cigarettes.  Get help right away if you have chest pain.  Please call our office for any questions or concerns.  [phone].   This information is not intended to replace advice given to you by your health care provider. Make sure you discuss any  questions you have with your health care provider. Document Released: 09/23/2009 Document Revised: 11/08/2017 Document Reviewed: 12/14/2016 Elsevier Patient Education  2020 [employer].

## 2021-04-24 NOTE — Anesthesia Postprocedure Evaluation (Signed)
Anesthesia Post Note  Patient: Kirsten Williams  Procedure(s) Performed: VIDEO BRONCHOSCOPY WITH ENDOBRONCHIAL NAVIGATION (Bilateral ) BRONCHIAL BIOPSIES BRONCHIAL BRUSHINGS BRONCHIAL NEEDLE ASPIRATION BIOPSIES BRONCHIAL WASHINGS FIDUCIAL MARKER PLACEMENT     Patient location during evaluation: Endoscopy Anesthesia Type: General Level of consciousness: awake and alert Pain management: pain level controlled Vital Signs Assessment: post-procedure vital signs reviewed and stable Respiratory status: spontaneous breathing, nonlabored ventilation, respiratory function stable and patient connected to nasal cannula oxygen Cardiovascular status: blood pressure returned to baseline and stable Postop Assessment: no apparent nausea or vomiting Anesthetic complications: no   No complications documented.  Last Vitals:  Vitals:   04/24/21 1530 04/24/21 1545  BP: 140/69 (!) 142/73  Pulse: 82 86  Resp: 20 20  Temp:  36.5 C  SpO2: 95% 98%    Last Pain:  Vitals:   04/24/21 1530  TempSrc:   PainSc: 0-No pain                 Belenda Cruise P Lacie Landry

## 2021-04-24 NOTE — Interval H&P Note (Signed)
History and Physical Interval Note:  04/24/2021 11:23 AM  Kirsten Williams  has presented today for surgery, with the diagnosis of BILATERAL PULMONARY NODULES.  The various methods of treatment have been discussed with the patient and family. After consideration of risks, benefits and other options for treatment, the patient has consented to  Procedure(s): [place] (Bilateral) as a surgical intervention.  The patient's history has been reviewed, patient examined, no change in status, stable for surgery.  I have reviewed the patient's chart and labs.  Questions were answered to the patient's satisfaction.     Collene Gobble

## 2021-04-24 NOTE — Transfer of Care (Signed)
Immediate Anesthesia Transfer of Care Note  Patient: Kirsten Williams  Procedure(s) Performed: VIDEO BRONCHOSCOPY WITH ENDOBRONCHIAL NAVIGATION (Bilateral ) ENDOBRONCHIAL ULTRASOUND BRONCHIAL BIOPSIES BRONCHIAL BRUSHINGS BRONCHIAL NEEDLE ASPIRATION BIOPSIES BRONCHIAL WASHINGS FIDUCIAL MARKER PLACEMENT  Patient Location: PACU  Anesthesia Type:General  Level of Consciousness: drowsy  Airway & Oxygen Therapy: Patient Spontanous Breathing and Patient connected to nasal cannula oxygen  Post-op Assessment: Report given to RN and Post -op Vital signs reviewed and stable  Post vital signs: Reviewed and stable  Last Vitals:  Vitals Value Taken Time  BP 145/77 04/24/21 1457  Temp    Pulse 86 04/24/21 1459  Resp 25 04/24/21 1459  SpO2 99 % 04/24/21 1459  Vitals shown include unvalidated device data.  Last Pain:  Vitals:   04/24/21 1124  TempSrc:   PainSc: 0-No pain         Complications: No complications documented.

## 2021-04-25 ENCOUNTER — Encounter (HOSPITAL_COMMUNITY): Payer: Self-pay | Admitting: Emergency Medicine

## 2021-04-25 LAB — CYTOLOGY - NON PAP

## 2021-04-26 LAB — CYTOLOGY - NON PAP

## 2021-04-27 ENCOUNTER — Telehealth: Payer: Self-pay | Admitting: Hematology and Oncology

## 2021-04-27 ENCOUNTER — Telehealth: Payer: Self-pay | Admitting: Emergency Medicine

## 2021-04-27 DIAGNOSIS — R918 Other nonspecific abnormal finding of lung field: Secondary | ICD-10-CM

## 2021-04-27 NOTE — Telephone Encounter (Signed)
Discussed bronchoscopy results with the patient by phone this morning.  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  The left upper lobe groundglass nodule was negative for malignancy although it remains suspicious based on appearance, characteristics.  I will refer her back to see oncology at [facility].  Will route to [employer] for [employer]

## 2021-04-27 NOTE — Telephone Encounter (Signed)
Per 5/19 Staff Message, patient scheduled w/Dr Alvy Bimler 5/25 at 1:00 pm  Lung CA

## 2021-05-03 ENCOUNTER — Ambulatory Visit: Payer: Commercial Managed Care - PPO | Admitting: Hematology and Oncology

## 2021-05-03 ENCOUNTER — Other Ambulatory Visit: Payer: Self-pay

## 2021-05-03 ENCOUNTER — Encounter: Payer: Self-pay | Admitting: Hematology and Oncology

## 2021-05-03 ENCOUNTER — Inpatient Hospital Stay: Payer: Commercial Managed Care - PPO | Attending: Hematology and Oncology | Admitting: Hematology and Oncology

## 2021-05-03 VITALS — BP 127/59 | HR 65 | Temp 97.7°F | Resp 18 | Ht 61.0 in | Wt 113.2 lb

## 2021-05-03 DIAGNOSIS — C3490 Malignant neoplasm of unspecified part of unspecified bronchus or lung: Secondary | ICD-10-CM | POA: Diagnosis not present

## 2021-05-03 DIAGNOSIS — C3491 Malignant neoplasm of unspecified part of right bronchus or lung: Secondary | ICD-10-CM | POA: Insufficient documentation

## 2021-05-03 DIAGNOSIS — R110 Nausea: Secondary | ICD-10-CM

## 2021-05-03 DIAGNOSIS — G8918 Other acute postprocedural pain: Secondary | ICD-10-CM | POA: Diagnosis not present

## 2021-05-03 DIAGNOSIS — R519 Headache, unspecified: Secondary | ICD-10-CM

## 2021-05-03 DIAGNOSIS — M329 Systemic lupus erythematosus, unspecified: Secondary | ICD-10-CM | POA: Insufficient documentation

## 2021-05-03 HISTORY — DX: Systemic lupus erythematosus, unspecified: M32.9

## 2021-05-03 HISTORY — DX: Malignant neoplasm of unspecified part of right bronchus or lung: C34.91

## 2021-05-03 MED ORDER — HYDROCODONE-ACETAMINOPHEN 5-325 MG PO TABS: 1.0000 | ORAL_TABLET | Freq: Four times a day (QID) | ORAL | 0 refills | Status: DC | PRN

## 2021-05-04 ENCOUNTER — Encounter: Payer: Self-pay | Admitting: Hematology and Oncology

## 2021-05-04 ENCOUNTER — Other Ambulatory Visit: Payer: Self-pay | Admitting: *Deleted

## 2021-05-04 DIAGNOSIS — R110 Nausea: Secondary | ICD-10-CM | POA: Insufficient documentation

## 2021-05-04 DIAGNOSIS — R519 Headache, unspecified: Secondary | ICD-10-CM

## 2021-05-04 DIAGNOSIS — G8918 Other acute postprocedural pain: Secondary | ICD-10-CM | POA: Insufficient documentation

## 2021-05-04 HISTORY — DX: Other acute postprocedural pain: G89.18

## 2021-05-04 HISTORY — DX: Nausea: R11.0

## 2021-05-04 HISTORY — DX: Headache, unspecified: R51.9

## 2021-05-04 NOTE — Progress Notes (Signed)
Kirsten Williams FOLLOW-UP progress notes  Patient Care Team: Kirsten Bender, PA-C as PCP - General (Physician Assistant)  CHIEF COMPLAINTS/PURPOSE OF VISIT:  Newly diagnosed stage IV lung cancer Her husband is present throughout the visit  HISTORY OF PRESENTING ILLNESS:  Kirsten Williams 50 y.o. female was transferred to my care after her recent diagnosis of lung cancer She has been seen here previously for abnormal CT imaging of the lung She has strong personal history of smoking but quit in 2016 The patient also have chronic history of migraine, headaches, nausea with vomiting, systemic lupus with diffuse musculoskeletal pain Her abnormal imaging study subsequently lead to PET scan and referral to pulmonologist She completed bronchoscopy with biopsy which confirmed adenocarcinoma of the lung  I reviewed the patient's records extensive and collaborated the history with the patient. Summary of her history is as follows: Oncology History  Adenocarcinoma, lung, right (Kirsten Williams)  03/06/2021 PET scan   1. Some of the ground-glass density nodules are mildly hypermetabolic including the right upper lobe sub solid nodule on image 82 of series 3 with maximum SUV of 2.5. The appearance is suspicious for multifocal low-grade adenocarcinoma. 2. Mildly hypermetabolic upper normal sized lower paratracheal lymph node with maximum SUV of 4.0 as compared to background blood pool activity of 2.2. Malignant involvement is difficult to exclude. 3. Other imaging findings of potential clinical significance: Aortic Atherosclerosis (ICD10-I70.0) and Emphysema (ICD10-J43.9). Airway thickening is present, suggesting bronchitis or reactive airways disease. Bilateral adrenal adenomas.   04/18/2021 Imaging   CT Chest 1. Imaging for electromagnetic bronchoscopy planning shows no significant change in the multiple part solid and ground-glass pulmonary nodules compared with previous CT and PET-CT of March. Growth  from older CTs and low level metabolic activity in some of these nodules remain suspicious for multifocal low-grade adenocarcinoma. 2. No new nodules or acute findings compared with recent prior imaging. 3. Mild Aortic Atherosclerosis (ICD10-I70.0). 4. Stable bilateral adrenal adenomas.   04/24/2021 Procedure   Date of Operation: 04/24/2021   Pre-op Diagnosis: Bilateral pulmonary nodules, paratracheal adenopathy   Post-op Diagnosis: Same   Surgeon: Kirsten Williams   Assistants: None   Anesthesia: General endotracheal anesthesia   Operation: Flexible video fiberoptic bronchoscopy with electromagnetic navigation and biopsies.   Estimated Blood Loss: Minimal   Complications: None apparent   Indications and History: Kirsten Williams is a 50 y.o. female with history of tobacco abuse.  She has a slowly enlarging mixed density pulmonary nodules bilaterally as well as a right paratracheal node.  Recommendation was made to achieve a tissue diagnosis via navigational bronchoscopy and endobronchial ultrasound.  The risks, benefits, complications, treatment options and expected outcomes were discussed with the patient.  The possibilities of pneumothorax, pneumonia, reaction to medication, pulmonary aspiration, perforation of a viscus, bleeding, failure to diagnose a condition and creating a complication requiring transfusion or operation were discussed with the patient who freely signed the consent.     Description of Procedure: The patient was seen in the Preoperative Area, was examined and was deemed appropriate to proceed.  The patient was taken to [facility] endoscopy room 2, identified as Kirsten Williams and the procedure verified as Flexible Video Fiberoptic Bronchoscopy.  A Time Out was held and the above information confirmed.    Prior to the date of the procedure a high-resolution CT scan of the chest was performed. Utilizing [place] a virtual tracheobronchial tree was  generated to allow the creation of distinct navigation pathways to the  patient's parenchymal abnormalities. After being taken to the operating room general anesthesia was initiated and the patient  was orally intubated. The video fiberoptic bronchoscope was introduced via the endotracheal tube and a general inspection was performed which showed normal airways throughout.  There are no endobronchial lesions. The extendable working channel and locator guide were introduced into the bronchoscope. The distinct navigation pathways prepared prior to this procedure were then utilized to navigate to within 0.3-1.0 cm of patient's lesion(s) identified on CT scan.    -Target 1 right upper lobe nodule -Target 2 right lower lobe superior segment nodule -Target 3 left upper lobe groundglass nodule   The extendable working channel was secured into place at each location and the locator guide was withdrawn. Under fluoroscopic guidance transbronchial brushings, transbronchial Wang needle biopsies, and transbronchial forceps biopsies were performed to be sent for cytology and pathology. Bronchioalveolar lavage was performed in the right lower lobe and left upper lobe (target 2, target 3) and sent for cytology.    Fiducial markers were placed adjacent to all 3 targets.  2 markers in the right upper lobe, 2 markers in the right lower lobe and 1 marker in the left upper lobe adjacent to these abnormalities.   There was a right pretracheal lymph node at station 4 on imaging that would have been a target for transbronchial needle biopsy.  EBUS could not be performed because the largest endotracheal tube that could be used to support the patient was 8.0 and the scope would not pass.   At the end of the procedure a general airway inspection was performed and there was no evidence of active bleeding. The bronchoscope was removed.  The patient tolerated the procedure well. There was no significant blood loss and there were no  obvious complications. A post-procedural chest x-ray is pending.   Samples: 1. Transbronchial brushings from right upper lobe nodule (1), right lower lobe nodule (2), left upper lobe nodule (3) 2. Transbronchial Wang needle biopsies from right upper lobe nodule (1), right lower lobe nodule (2), left upper lobe nodule (3) 3. Transbronchial forceps biopsies from right upper lobe nodule (1), right lower lobe nodule (2), left upper lobe nodule (3) 4. Bronchoalveolar lavage from right lower lobe (2), left upper lobe (3)   05/03/2021 Initial Diagnosis   Adenocarcinoma, lung, right ([place])   05/03/2021 Cancer Staging   Staging form: Lung, AJCC 8th Edition - Clinical: Stage IVA (cT1b, cN1, pM1a) - Signed by Heath Lark, MD on 05/03/2021    Since her biopsy, she has uncontrolled pleuritic chest pain radiating to the back She rated her pain at 7 out of 10 She denies excessive cough Throughout the visit, both the patient and husband were emotional She explained that she has seen many doctors for various different medical complaints and took a prolonged time to get things done For example, she has severe joint pain since diagnosis of systemic lupus and the current prescribed Plaquenil did not treat her pain She also have chronic migraine headaches with aura with severe headaches, vertigo and blurriness of vision From the GI standpoint, she has chronic nausea and vomiting without constipation. She denies recent weight loss She is currently not working She drinks liquor, approximately 10-15 shots every 3 to 4 weeks She has poor dentition and has been on amoxicillin antibiotics.  She is not able to get dental extraction due to insurance issue  MEDICAL HISTORY:  Past Medical History:  Diagnosis Date  . Anxiety   . GERD (gastroesophageal  reflux disease)   . SLE (systemic lupus erythematosus) ([place]) 05/03/2021  . SLE (systemic lupus erythematosus) ([place]) 05/03/2021    SURGICAL HISTORY: Past Surgical  History:  Procedure Laterality Date  . ABDOMINAL HYSTERECTOMY    . BRONCHIAL BIOPSY  04/24/2021   Procedure: BRONCHIAL BIOPSIES;  Surgeon: Collene Gobble, MD;  Location: [facility] ENDOSCOPY;  Service: Pulmonary;;  . BRONCHIAL BRUSHINGS  04/24/2021   Procedure: BRONCHIAL BRUSHINGS;  Surgeon: Collene Gobble, MD;  Location: [facility] ENDOSCOPY;  Service: Pulmonary;;  . BRONCHIAL NEEDLE ASPIRATION BIOPSY  04/24/2021   Procedure: BRONCHIAL NEEDLE ASPIRATION BIOPSIES;  Surgeon: Collene Gobble, MD;  Location: [facility] ENDOSCOPY;  Service: Pulmonary;;  . BRONCHIAL WASHINGS  04/24/2021   Procedure: BRONCHIAL WASHINGS;  Surgeon: Collene Gobble, MD;  Location: [facility] ENDOSCOPY;  Service: Pulmonary;;  . CESAREAN SECTION  11/09/1990  . DILATION AND CURETTAGE OF UTERUS Bilateral 09/09/1996  . FIDUCIAL MARKER PLACEMENT  04/24/2021   Procedure: FIDUCIAL MARKER PLACEMENT;  Surgeon: Collene Gobble, MD;  Location: [facility] ENDOSCOPY;  Service: Pulmonary;;  . TONSILLECTOMY  12/10/1977  . VIDEO BRONCHOSCOPY WITH ENDOBRONCHIAL NAVIGATION Bilateral 04/24/2021   Procedure: VIDEO BRONCHOSCOPY WITH ENDOBRONCHIAL NAVIGATION;  Surgeon: Collene Gobble, MD;  Location: [facility] ENDOSCOPY;  Service: Pulmonary;  Laterality: Bilateral;    SOCIAL HISTORY: Social History   Socioeconomic History  . Marital status: Married    Spouse name: Not on file  . Number of children: Not on file  . Years of education: Not on file  . Highest education level: Not on file  Occupational History  . Not on file  Tobacco Use  . Smoking status: Former Smoker    Packs/day: 1.50    Years: 32.00    Pack years: 48.00    Types: Cigarettes    Quit date: 12/10/2014    Years since quitting: 6.4  . Smokeless tobacco: Never Used  Vaping Use  . Vaping Use: Never used  Substance and Sexual Activity  . Alcohol use: Yes    Alcohol/week: 3.0 standard drinks    Types: 3 Shots of liquor per week    Comment: daily  . Drug use: Never  . Sexual activity: Yes  Other Topics Concern   . Not on file  Social History Narrative  . Not on file   Social Determinants of Health   Financial Resource Strain: Not on file  Food Insecurity: Not on file  Transportation Needs: Not on file  Physical Activity: Not on file  Stress: Not on file  Social Connections: Not on file  Intimate Partner Violence: Not on file    FAMILY HISTORY: Family History  Problem Relation Age of Onset  . Stroke Mother   . Stroke Father   . Cancer Father   . Hypertension Sister     ALLERGIES:  has No Known Allergies.  MEDICATIONS:  Current Outpatient Medications  Medication Sig Dispense Refill  . HYDROcodone-acetaminophen (NORCO/VICODIN) 5-325 MG tablet Take 1 tablet by mouth every 6 (six) hours as needed for moderate pain. 30 tablet 0  . acetaminophen (TYLENOL) 325 MG tablet Take 975 mg by mouth every 6 (six) hours as needed for moderate pain or headache.    Marland Kitchen amoxicillin (AMOXIL) 500 MG capsule Take 500 mg by mouth 3 (three) times daily.    . cyclobenzaprine (FLEXERIL) 10 MG tablet Take 10 mg by mouth at bedtime as needed for muscle spasms.    . diphenhydrAMINE (BENADRYL) 25 MG tablet Take 25 mg by mouth daily as needed for allergies.    Marland Kitchen  hydroxychloroquine (PLAQUENIL) 200 MG tablet Take 200 mg by mouth 2 (two) times daily.    . Multiple Vitamins-Minerals (MULTI ADULT GUMMIES) CHEW Chew 2 tablets by mouth daily.    Marland Kitchen omeprazole (PRILOSEC) 40 MG capsule Take 40 mg by mouth daily as needed (acid reflux).    . ondansetron (ZOFRAN) 4 MG tablet Take 8 mg by mouth 2 (two) times daily as needed for vomiting or nausea.    . rizatriptan (MAXALT) 10 MG tablet Take 10 mg by mouth as needed for migraine. May repeat in 2 hours if needed (Patient not taking: Reported on 04/24/2021)     No current facility-administered medications for this visit.    REVIEW OF SYSTEMS:   Constitutional: Denies fevers, chills or abnormal night sweats Eyes: Denies blurriness of vision, double vision or watery eyes Ears,  nose, mouth, throat, and face: Denies mucositis or sore throat Respiratory: Denies cough, dyspnea or wheezes Cardiovascular: Denies palpitation, chest discomfort or lower extremity swelling Skin: Denies abnormal skin rashes Lymphatics: Denies new lymphadenopathy or easy bruising Behavioral/Psych: Mood is stable, no new changes  All other systems were reviewed with the patient and are negative.  PHYSICAL EXAMINATION: ECOG PERFORMANCE STATUS: 1 - Symptomatic but completely ambulatory  Vitals:   05/03/21 1303  BP: (!) 127/59  Pulse: 65  Resp: 18  Temp: 97.7 F (36.5 C)  SpO2: 98%   Filed Weights   05/03/21 1303  Weight: 113 lb 4 oz (51.4 kg)    GENERAL:alert, no distress and comfortable SKIN: skin color, texture, turgor are normal, no rashes or significant lesions EYES: normal, conjunctiva are pink and non-injected, sclera clear OROPHARYNX:no exudate, normal lips, buccal mucosa, and tongue  NECK: supple, thyroid normal size, non-tender, without nodularity LYMPH:  no palpable lymphadenopathy in the cervical, axillary or inguinal LUNGS: clear to auscultation and percussion with normal breathing effort HEART: regular rate & rhythm and no murmurs without lower extremity edema ABDOMEN:abdomen soft, non-tender and normal bowel sounds Musculoskeletal:no cyanosis of digits and no clubbing  PSYCH: alert & oriented x 3 with fluent speech NEURO: no focal motor/sensory deficits  LABORATORY DATA:  I have reviewed the data as listed Lab Results  Component Value Date   WBC 7.9 04/12/2021   HGB 14.0 04/12/2021   HCT 41.1 04/12/2021   MCV 91.5 04/12/2021   PLT 242.0 04/12/2021   Recent Labs    03/13/21 0000 04/12/21 1022  NA 140 138  K 3.9 4.8  CL 105 104  CO2 26* 30  GLUCOSE  --  90  BUN 10 11  CREATININE 0.6 0.72  CALCIUM 9.2 9.9  ALBUMIN 4.5  --   AST 50*  --   ALT 51*  --   ALKPHOS 89  --     RADIOGRAPHIC STUDIES: I have reviewed her prior CT and PET CT scans I have  personally reviewed the radiological images as listed and agreed with the findings in the report. DG Chest Port 1 View  Result Date: 04/24/2021 CLINICAL DATA:  Status post bronchoscopy EXAM: PORTABLE CHEST 1 VIEW COMPARISON:  04/18/2021 FINDINGS: Cardiac shadow is within normal limits. Lungs are well aerated bilaterally. Fiducial markers are now seen related to the recent bronchoscopy. No pneumothorax is identified correspond with the recent biopsies. No acute bony abnormality is noted. IMPRESSION: Status post bronchoscopy with fiducial marker placement. No pneumothorax is noted. Electronically Signed   By: Inez Catalina M.D.   On: 04/24/2021 15:45   DG C-ARM BRONCHOSCOPY  Result Date: 04/24/2021 C-ARM  BRONCHOSCOPY: Fluoroscopy was utilized by the requesting physician.  No radiographic interpretation.    ASSESSMENT & PLAN:  Adenocarcinoma, lung, right ([place]) I have reviewed her imaging study extensively There is delay between her PET CT scan at the end of March until present time when diagnosis is established Recommend MRI of the brain to complete staging I would like to have repeat PET CT scan to repeat staging before we proceed with treatment In general, stage IV disease are considered unresectable Her case was recently discussed at the pulmonology/thoracic oncology tumor board Consideration can be made, given her very young age, for wedge resection of peripheral nodules along with SBRT for the remainder of the disease, assuming there is no significant progression since her last PET CT scan I have spoken with pathologist to see if she can add additional stain and to order additional molecular study to existing tissue If not possible, we will have to order guardant 360 to complete molecular work-up The patient is very tearful due to uncontrolled pain Her husband is frustrated and angry because of excessive delay and further testing I explained the rationale for the above I prescribed low-dose  pain medicine to treat her pain I will see her back in 2 weeks for further follow-up  SLE (systemic lupus erythematosus) ([place]) She has active systemic lupus Use of immunotherapy is relatively contraindicated I will defer to her rheumatologist for further management  Post-op pain The patient is angry and tearful at the same time She has severe pain since her biopsy When I started questioning about her pain, she started to state that she has severe chronic musculoskeletal pain for many years and "nobody would listen" I told the patient I am willing to prescribe short course of pain medicine to treat her postop pain but not for long-term noncancer associated pain  Headache disorder She have severe headache disorder, vertigo, blurriness of vision, migraine with aura, etc. She has seen many many specialists and does not want further work-up I explained to the patient my concern about intracranial metastasis and we will proceed with MRI brain for evaluation  Chronic nausea She has chronic nausea and vomiting on a regular basis and takes Zofran on a regular basis She denies constipation I warned her about risk of nausea with pain medicine   Orders Placed This Encounter  Procedures  . MR Brain W Wo Contrast    Standing Status:   Future    Standing Expiration Date:   05/03/2022    Order Specific Question:   If indicated for the ordered procedure, I authorize the administration of contrast media per Radiology protocol    Answer:   Yes    Order Specific Question:   What is the patient's sedation requirement?    Answer:   No Sedation    Order Specific Question:   Does the patient have a pacemaker or implanted devices?    Answer:   No    Order Specific Question:   Use SRS Protocol?    Answer:   No    Order Specific Question:   Preferred imaging location?    Answer:   External  . NM PET Image Restage (PS) Skull Base to Thigh    Standing Status:   Future    Standing Expiration Date:    05/03/2022    Order Specific Question:   If indicated for the ordered procedure, I authorize the administration of a radiopharmaceutical per Radiology protocol    Answer:   Yes  Order Specific Question:   Preferred imaging location?    Answer:   External    Order Specific Question:   Radiology Contrast Protocol - do NOT remove file path    Answer:   [email or web address]    Order Specific Question:   Is the patient pregnant?    Answer:   No    All questions were answered. The patient knows to call the clinic with any problems, questions or concerns. The total time spent in the appointment was 70 minutes encounter with patients including review of chart and various tests results, discussions about plan of care and coordination of care plan   Heath Lark, MD 05/04/2021 1:51 PM

## 2021-05-04 NOTE — Assessment & Plan Note (Signed)
She has active systemic lupus Use of immunotherapy is relatively contraindicated I will defer to her rheumatologist for further management

## 2021-05-04 NOTE — Assessment & Plan Note (Signed)
She have severe headache disorder, vertigo, blurriness of vision, migraine with aura, etc. She has seen many many specialists and does not want further work-up I explained to the patient my concern about intracranial metastasis and we will proceed with MRI brain for evaluation

## 2021-05-04 NOTE — Assessment & Plan Note (Addendum)
The patient is angry and tearful at the same time She has severe pain since her biopsy When I started questioning about her pain, she started to state that she has severe chronic musculoskeletal pain for many years and "nobody would listen" I told the patient I am willing to prescribe short course of pain medicine to treat her postop pain but not for long-term noncancer associated pain

## 2021-05-04 NOTE — Assessment & Plan Note (Signed)
I have reviewed her imaging study extensively There is delay between her PET CT scan at the end of March until present time when diagnosis is established Recommend MRI of the brain to complete staging I would like to have repeat PET CT scan to repeat staging before we proceed with treatment In general, stage IV disease are considered unresectable Her case was recently discussed at the pulmonology/thoracic oncology tumor board Consideration can be made, given her very young age, for wedge resection of peripheral nodules along with SBRT for the remainder of the disease, assuming there is no significant progression since her last PET CT scan I have spoken with pathologist to see if she can add additional stain and to order additional molecular study to existing tissue If not possible, we will have to order guardant 360 to complete molecular work-up The patient is very tearful due to uncontrolled pain Her husband is frustrated and angry because of excessive delay and further testing I explained the rationale for the above I prescribed low-dose pain medicine to treat her pain I will see her back in 2 weeks for further follow-up

## 2021-05-04 NOTE — Assessment & Plan Note (Signed)
She has chronic nausea and vomiting on a regular basis and takes Zofran on a regular basis She denies constipation I warned her about risk of nausea with pain medicine

## 2021-05-04 NOTE — Progress Notes (Signed)
The proposed treatment discussed in cancer conference is for discussion purpose only and is not a binding recommendation.  The patient was not physically examined nor present for their treatment options. Therefore, final treatment plans cannot be decided.   Will update Dr. Alvy Bimler

## 2021-05-16 ENCOUNTER — Ambulatory Visit: Payer: Commercial Managed Care - PPO | Admitting: Emergency Medicine

## 2021-05-16 ENCOUNTER — Encounter: Payer: Self-pay | Admitting: Emergency Medicine

## 2021-05-16 ENCOUNTER — Other Ambulatory Visit: Payer: Self-pay

## 2021-05-16 DIAGNOSIS — C3491 Malignant neoplasm of unspecified part of right bronchus or lung: Secondary | ICD-10-CM

## 2021-05-16 NOTE — Patient Instructions (Signed)
We will work on trying to confirm scheduling of your MRI brain and PET scan, either in [place] or at [place] We will perform pulmonary function testing to establish your baseline function.  This may impact which kind of treatments you can have going forward. Follow with Dr. Lamonte Sakai in 2 months or sooner if you have any problems.

## 2021-05-16 NOTE — Assessment & Plan Note (Signed)
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  Given that is different appearance than the lesions on the right I suspect that these are synchronous primaries.  She is being evaluated by Dr. Alvy Bimler, has MRI brain and repeat PET scan planned.  Need to confirm scheduling of these.  She is willing to get them either in [place] or at [place].  I will perform pulmonary function testing to establish her baseline.  This may impact whether she be a candidate for wedge resection of 1 or several of the lesions going forward.

## 2021-05-16 NOTE — Progress Notes (Signed)
Subjective:    Patient ID: Kirsten Williams, female    DOB: 1971-01-18, 50 y.o.   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  She is been followed by primary physician for stable pulmonary nodules that date back to 2019.   She reports that she feels well, she has some occasional cough. Never hemoptysis. No CP. Rash is well controlled.   Multiple CT scans of the chest reviewed from 2019, most recent was 02/16/2021 that showed some apical scar, a 2 cm groundglass left upper lobe nodule slightly increased in size now with 11 mm of solid component, 10 mm solid posterior right upper lobe nodule that has increased in size from 7 mm.  There is a 50mm superior segmental right lower lobe nodule with a 10 mm predominant solid component that is slightly enlarged as well. A PET scan performed 03/06/2021 reviewed by me shows mild hypermetabolism in the right upper lobe nodule SUV 2.5, no significant hypermetabolism in the other nodules all of which were stable in size.  Mild hypermetabolic lower paratracheal lymph node.     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  I saw her for increasing size and pulmonary nodular disease in the left upper lobe, right upper lobe, right lower lobe.  There was mild hypermetabolism by PET 03/06/2021.  She underwent bronchoscopy on 04/24/2021.  The right upper lobe and right lower lobe nodules were consistent with adenocarcinoma.  The left upper lobe groundglass opacity was nondiagnostic.  She has been seen by Dr. Alvy Bimler, is being considered for possible systemic therapy versus resection with surveillance of the left upper lobe lesion.  Repeat PET scan has been ordered to guide therapy as well as MRI brain. She is not on any inhaled medication. She denies any  shortness of breath. She did have some chest discomfort following the procedure, improving. She is having some allergy drainage, some cough.    Review of Systems As per HPI     Objective:   Physical Exam  Vitals:   05/16/21 0904  BP: 118/82  Pulse: 86  Temp: 98.1 F (36.7 C)  TempSrc: Temporal  SpO2: 99%  Weight: 116 lb (52.6 kg)  Height: 5\' 1"  (1.549 m)   Gen: Pleasant, well-nourished, in no distress,  normal affect  ENT: No lesions,  mouth clear,  oropharynx clear, no postnasal drip  Neck: No JVD, no stridor  Lungs: No use of accessory muscles, no crackles or wheezing on normal respiration, no wheeze on forced expiration  Cardiovascular: RRR, heart sounds normal, no murmur or gallops, no peripheral edema  Musculoskeletal: No deformities, no cyanosis or clubbing  Neuro: alert, awake, non focal  Skin: Warm, no lesions or rash     Assessment & Plan:  Adenocarcinoma, lung, right (HCC) Synchronous primaries versus suspected stage IV disease presuming that the left upper lobe groundglass nodule represents adenocarcinoma.  Given that is different appearance than the lesions on the right I suspect that these are synchronous primaries.  She is being evaluated by Dr. Alvy Bimler, has MRI brain and repeat PET scan planned.  Need to confirm scheduling of these.  She is willing to get them either in [place] or at [place].  I will perform pulmonary function testing to establish her baseline.  This may impact whether she be a candidate for wedge resection  of 1 or several of the lesions going forward.   Baltazar Apo, MD, PhD 05/16/2021, 9:30 AM [facility] [phone] or if no answer before 7:00PM call [phone] For any issues after 7:00PM please call eLink [phone]

## 2021-05-16 NOTE — Addendum Note (Signed)
Addended by: Valerie Salts on: 05/16/2021 09:44 AM   Modules accepted: Orders

## 2021-05-22 ENCOUNTER — Encounter (HOSPITAL_COMMUNITY): Payer: Commercial Managed Care - PPO

## 2021-05-22 ENCOUNTER — Ambulatory Visit (HOSPITAL_COMMUNITY): Payer: Commercial Managed Care - PPO

## 2021-05-24 ENCOUNTER — Inpatient Hospital Stay: Payer: Commercial Managed Care - PPO | Admitting: Hematology and Oncology

## 2021-05-24 ENCOUNTER — Ambulatory Visit: Payer: Commercial Managed Care - PPO | Admitting: Hematology and Oncology

## 2021-05-31 ENCOUNTER — Telehealth: Payer: Self-pay

## 2021-05-31 ENCOUNTER — Encounter: Payer: Self-pay | Admitting: Adult Health

## 2021-05-31 ENCOUNTER — Other Ambulatory Visit: Payer: Self-pay

## 2021-05-31 ENCOUNTER — Telehealth: Payer: Self-pay | Admitting: Adult Health

## 2021-05-31 ENCOUNTER — Telehealth: Payer: Self-pay | Admitting: *Deleted

## 2021-05-31 ENCOUNTER — Ambulatory Visit: Payer: Commercial Managed Care - PPO

## 2021-05-31 ENCOUNTER — Inpatient Hospital Stay: Payer: Commercial Managed Care - PPO | Attending: Hematology and Oncology | Admitting: Adult Health

## 2021-05-31 VITALS — BP 139/70 | HR 87 | Temp 98.1°F | Resp 16

## 2021-05-31 DIAGNOSIS — C3491 Malignant neoplasm of unspecified part of right bronchus or lung: Secondary | ICD-10-CM

## 2021-05-31 NOTE — Assessment & Plan Note (Signed)
I reviewed the results of her PET scans and MRI of the brain which show no progression.    We do not yet have the additional pathology results that Dr. Alvy Bimler had requested at the last visit.  My nurse has called pathology.  We will hear from them later today, and she may need guardant360 testing sent.  I will f/u with the patient and with the lung cancer team about next steps to ensure that we get everything needed prior to her next oncology office visit.   The patient was very frustrated about how she has seen 3 different providers and would like to be seen in [place] where there can be more consistency.  She has requested to see Dr. Julien Nordmann.  I have communicated this with the [place] and [facility] team.    Her post biopsy pain is improving and controlled at this time.    She will continue to f/u with Dermatology about her lupus, this is very well controlled with plaquenil.

## 2021-05-31 NOTE — Progress Notes (Signed)
[place] Cancer Follow up:    Kirsten Bender, PA-C [address] [place] [address]   DIAGNOSIS: Cancer Staging Adenocarcinoma, lung, right ([place]) Staging form: Lung, AJCC 8th Edition - Clinical: Stage IVA (cT1b, cN1, pM1a) - Signed by Heath Lark, MD on 05/03/2021   SUMMARY OF ONCOLOGIC HISTORY: Oncology History  Adenocarcinoma, lung, right ([place])  03/06/2021 PET scan   1. Some of the ground-glass density nodules are mildly hypermetabolic including the right upper lobe sub solid nodule on image 82 of series 3 with maximum SUV of 2.5. The appearance is suspicious for multifocal low-grade adenocarcinoma. 2. Mildly hypermetabolic upper normal sized lower paratracheal lymph node with maximum SUV of 4.0 as compared to background blood pool activity of 2.2. Malignant involvement is difficult to exclude. 3. Other imaging findings of potential clinical significance: Aortic Atherosclerosis (ICD10-I70.0) and Emphysema (ICD10-J43.9). Airway thickening is present, suggesting bronchitis or reactive airways disease. Bilateral adrenal adenomas.   04/18/2021 Imaging   CT Chest 1. Imaging for electromagnetic bronchoscopy planning shows no significant change in the multiple part solid and ground-glass pulmonary nodules compared with previous CT and PET-CT of March. Growth from older CTs and low level metabolic activity in some of these nodules remain suspicious for multifocal low-grade adenocarcinoma. 2. No new nodules or acute findings compared with recent prior imaging. 3. Mild Aortic Atherosclerosis (ICD10-I70.0). 4. Stable bilateral adrenal adenomas.   04/24/2021 Procedure   Date of Operation: 04/24/2021   Pre-op Diagnosis: Bilateral pulmonary nodules, paratracheal adenopathy   Post-op Diagnosis: Same   Surgeon: Baltazar Apo   Assistants: None   Anesthesia: General endotracheal anesthesia   Operation: Flexible video fiberoptic bronchoscopy with electromagnetic navigation  and biopsies.   Estimated Blood Loss: Minimal   Complications: None apparent   Indications and History: Kirsten Williams is a 50 y.o. female with history of tobacco abuse.  She has a slowly enlarging mixed density pulmonary nodules bilaterally as well as a right paratracheal node.  Recommendation was made to achieve a tissue diagnosis via navigational bronchoscopy and endobronchial ultrasound.  The risks, benefits, complications, treatment options and expected outcomes were discussed with the patient.  The possibilities of pneumothorax, pneumonia, reaction to medication, pulmonary aspiration, perforation of a viscus, bleeding, failure to diagnose a condition and creating a complication requiring transfusion or operation were discussed with the patient who freely signed the consent.     Description of Procedure: The patient was seen in the Preoperative Area, was examined and was deemed appropriate to proceed.  The patient was taken to [facility] endoscopy room 2, identified as Kirsten Williams and the procedure verified as Flexible Video Fiberoptic Bronchoscopy.  A Time Out was held and the above information confirmed.    Prior to the date of the procedure a high-resolution CT scan of the chest was performed. Utilizing Hurt a virtual tracheobronchial tree was generated to allow the creation of distinct navigation pathways to the patient's parenchymal abnormalities. After being taken to the operating room general anesthesia was initiated and the patient  was orally intubated. The video fiberoptic bronchoscope was introduced via the endotracheal tube and a general inspection was performed which showed normal airways throughout.  There are no endobronchial lesions. The extendable working channel and locator guide were introduced into the bronchoscope. The distinct navigation pathways prepared prior to this procedure were then utilized to navigate to within 0.3-1.0 cm of patient's lesion(s)  identified on CT scan.    -Target 1 right upper lobe nodule -  Target 2 right lower lobe superior segment nodule -Target 3 left upper lobe groundglass nodule   The extendable working channel was secured into place at each location and the locator guide was withdrawn. Under fluoroscopic guidance transbronchial brushings, transbronchial Wang needle biopsies, and transbronchial forceps biopsies were performed to be sent for cytology and pathology. Bronchioalveolar lavage was performed in the right lower lobe and left upper lobe (target 2, target 3) and sent for cytology.    Fiducial markers were placed adjacent to all 3 targets.  2 markers in the right upper lobe, 2 markers in the right lower lobe and 1 marker in the left upper lobe adjacent to these abnormalities.   There was a right pretracheal lymph node at station 4 on imaging that would have been a target for transbronchial needle biopsy.  EBUS could not be performed because the largest endotracheal tube that could be used to support the patient was 8.0 and the scope would not pass.   At the end of the procedure a general airway inspection was performed and there was no evidence of active bleeding. The bronchoscope was removed.  The patient tolerated the procedure well. There was no significant blood loss and there were no obvious complications. A post-procedural chest x-ray is pending.   Samples: 1. Transbronchial brushings from right upper lobe nodule (1), right lower lobe nodule (2), left upper lobe nodule (3) 2. Transbronchial Wang needle biopsies from right upper lobe nodule (1), right lower lobe nodule (2), left upper lobe nodule (3) 3. Transbronchial forceps biopsies from right upper lobe nodule (1), right lower lobe nodule (2), left upper lobe nodule (3) 4. Bronchoalveolar lavage from right lower lobe (2), left upper lobe (3)   05/03/2021 Initial Diagnosis   Adenocarcinoma, lung, right ([place])   05/03/2021 Cancer Staging   Staging form:  Lung, AJCC 8th Edition - Clinical: Stage IVA (cT1b, cN1, pM1a) - Signed by Heath Lark, MD on 05/03/2021      CURRENT THERAPY: undergoing work up  INTERVAL HISTORY: Kirsten Williams 50 y.o. female returns for evaluation and follow up of test results.    She underwent PET scan on 05/29/2021 at [facility] that showed no changes, and similar size and mildly decreased hypermetabolism of bilateral pulmonary nodules, and similar size and mild hypermetabolism of right paratracheal node.  There was no hypermetabolic extrathoracic metastatic disease noted.    She underwent MRI head w wo contrast on 05/19/2021 and there was no evidence of intracranial metastases.  She did have a very small 72m meningioma in the right temporal lobe.    KMagalyand her husband came in today and were increasingly frustrated due to a provider switch.  She also expressed concern over things getting rescheduled, and not having any answers about a finite treatment plan.    In regards to her lupus, she notes that it is skin only, she takes plaquenil for it, and it has not progressed.  She follows up with her dermatologist regularly.    Her post biopsy pain she notes is improving with time.  She is taking the norco prescribed by Dr. GAlvy Bimler but only when it is absolutely needed.  She denies any new issues today.     Patient Active Problem List   Diagnosis Date Noted   Post-op pain 05/04/2021   Headache disorder 05/04/2021   Chronic nausea 05/04/2021   A[place]ocarcinoma, lung, right ([place] 05/03/2021   SLE (systemic lupus erythematosus) ([place] 05/03/2021   Pulmonary nodules/lesions, multiple 04/12/2021  ANXIETY 02/06/2007   TOBACCO DEPENDENCE 02/06/2007   MIGRAINE, UNSPEC., W/O INTRACTABLE MIGRAINE 02/06/2007   INSOMNIA NOS 02/06/2007    has No Known Allergies.  MEDICAL HISTORY: Past Medical History:  Diagnosis Date   Adenocarcinoma, lung, right ([place]) 05/03/2021   Kirsten Williams is a 50 y.o. female with a  history of lung nodules dating back to 2019 who is referred in consultation with Kirsten Bender, PA-C for assessment and management. She is a former smoker having quit in 2016. 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 She missed her screening CT last year and imaging was ordered last month. CT chest from 02-16-2021 reveals pro   Anxiety    GERD (gastroesophageal reflux disease)    SLE (systemic lupus erythematosus) ([place]) 05/03/2021   SLE (systemic lupus erythematosus) ([place]) 05/03/2021    SURGICAL HISTORY: Past Surgical History:  Procedure Laterality Date   ABDOMINAL HYSTERECTOMY     BRONCHIAL BIOPSY  04/24/2021   Procedure: BRONCHIAL BIOPSIES;  Surgeon: Collene Gobble, MD;  Location: [place];  Service: Pulmonary;;   BRONCHIAL BRUSHINGS  04/24/2021   Procedure: BRONCHIAL BRUSHINGS;  Surgeon: Collene Gobble, MD;  Location: [facility] ENDOSCOPY;  Service: Pulmonary;;   BRONCHIAL NEEDLE ASPIRATION BIOPSY  04/24/2021   Procedure: BRONCHIAL NEEDLE ASPIRATION BIOPSIES;  Surgeon: Collene Gobble, MD;  Location: [employer];  Service: Pulmonary;;   BRONCHIAL WASHINGS  04/24/2021   Procedure: BRONCHIAL WASHINGS;  Surgeon: Collene Gobble, MD;  Location: [place];  Service: Pulmonary;;   CESAREAN SECTION  11/09/1990   DILATION AND CURETTAGE OF UTERUS Bilateral 09/09/1996   FIDUCIAL MARKER PLACEMENT  04/24/2021   Procedure: FIDUCIAL MARKER PLACEMENT;  Surgeon: Collene Gobble, MD;  Location: [facility] ENDOSCOPY;  Service: Pulmonary;;   TONSILLECTOMY  12/10/1977   VIDEO BRONCHOSCOPY WITH ENDOBRONCHIAL NAVIGATION Bilateral 04/24/2021   Procedure: VIDEO BRONCHOSCOPY WITH ENDOBRONCHIAL NAVIGATION;  Surgeon: Collene Gobble, MD;  Location: [facility] ENDOSCOPY;  Service: Pulmonary;  Laterality: Bilateral;    SOCIAL HISTORY: Social History   Socioeconomic History   Marital status: Married    Spouse name: Not on file   Number of children: Not on file   Years of education: Not on file    Highest education level: Not on file  Occupational History   Not on file  Tobacco Use   Smoking status: Former    Packs/day: 1.50    Years: 32.00    Pack years: 48.00    Types: Cigarettes    Quit date: 12/10/2014    Years since quitting: 6.4   Smokeless tobacco: Never  Vaping Use   Vaping Use: Never used  Substance and Sexual Activity   Alcohol use: Yes    Alcohol/week: 3.0 standard drinks    Types: 3 Shots of liquor per week    Comment: daily   Drug use: Never   Sexual activity: Yes  Other Topics Concern   Not on file  Social History Narrative   Not on file   Social Determinants of Health   Financial Resource Strain: Not on file  Food Insecurity: Not on file  Transportation Needs: Not on file  Physical Activity: Not on file  Stress: Not on file  Social Connections: Not on file  Intimate Partner Violence: Not on file    FAMILY HISTORY: Family History  Problem Relation Age of Onset   Stroke Mother    Stroke Father    Cancer Father    Hypertension Sister  Review of Systems  Constitutional:  Negative for appetite change, chills, fatigue, fever and unexpected weight change.  HENT:   Negative for hearing loss, lump/mass and trouble swallowing.   Eyes:  Negative for eye problems and icterus.  Respiratory:  Negative for chest tightness, cough and shortness of breath.   Cardiovascular:  Negative for chest pain, leg swelling and palpitations.  Gastrointestinal:  Negative for abdominal distention, abdominal pain, constipation, diarrhea, nausea and vomiting.  Endocrine: Negative for hot flashes.  Genitourinary:  Negative for difficulty urinating.   Musculoskeletal:  Negative for arthralgias.  Skin:  Negative for itching and rash.  Neurological:  Negative for dizziness, extremity weakness, headaches and numbness.  Hematological:  Negative for adenopathy. Does not bruise/bleed easily.  Psychiatric/Behavioral:  Negative for depression. The patient is nervous/anxious.       PHYSICAL EXAMINATION  ECOG PERFORMANCE STATUS: 1 - Symptomatic but completely ambulatory  Vitals:   05/31/21 0945  BP: 139/70  Pulse: 87  Resp: 16  Temp: 98.1 F (36.7 C)  SpO2: 97%    Physical Exam Constitutional:      Appearance: Normal appearance.  HENT:     Head: Normocephalic.  Skin:    General: Skin is warm and dry.     Findings: No rash.  Neurological:     General: No focal deficit present.     Mental Status: She is alert.  Psychiatric:        Mood and Affect: Mood normal.        Behavior: Behavior normal.        Thought Content: Thought content normal.  No extensive PE today, discussion only  LABORATORY DATA:  CBC    Component Value Date/Time   WBC 7.9 04/12/2021 1022   RBC 4.50 04/12/2021 1022   HGB 14.0 04/12/2021 1022   HCT 41.1 04/12/2021 1022   PLT 242.0 04/12/2021 1022   MCV 91.5 04/12/2021 1022   MCV 92 03/13/2021 0000   MCHC 34.1 04/12/2021 1022   RDW 13.5 04/12/2021 1022   LYMPHSABS 3.0 04/12/2021 1022   MONOABS 0.5 04/12/2021 1022   EOSABS 0.2 04/12/2021 1022   BASOSABS 0.1 04/12/2021 1022    CMP     Component Value Date/Time   NA 138 04/12/2021 1022   NA 140 03/13/2021 0000   K 4.8 04/12/2021 1022   CL 104 04/12/2021 1022   CO2 30 04/12/2021 1022   GLUCOSE 90 04/12/2021 1022   BUN 11 04/12/2021 1022   BUN 10 03/13/2021 0000   CREATININE 0.72 04/12/2021 1022   CALCIUM 9.9 04/12/2021 1022   ALBUMIN 4.5 03/13/2021 0000   AST 50 (A) 03/13/2021 0000   ALT 51 (A) 03/13/2021 0000   ALKPHOS 89 03/13/2021 0000          ASSESSMENT and THERAPY PLAN:   Adenocarcinoma, lung, right ([place]) I reviewed the results of her PET scans and MRI of the brain which show no progression.    We do not yet have the additional pathology results that Dr. Alvy Bimler had requested at the last visit.  My nurse has called pathology.  We will hear from them later today, and she may need guardant360 testing sent.  I will f/u with the patient and with  the lung cancer team about next steps to ensure that we get everything needed prior to her next oncology office visit.   The patient was very frustrated about how she has seen 3 different providers and would like to be seen in [place] where  there can be more consistency.  She has requested to see Dr. Julien Nordmann.  I have communicated this with the [place] and [facility] team.    Her post biopsy pain is improving and controlled at this time.    She will continue to f/u with Dermatology about her lupus, this is very well controlled with plaquenil.     I gave the patient the phone number to [facility] so she can have as a reference.  She knows to call us here at [facility] for any questions or concerns in the interim.  Wilber Bihari, NP 05/31/21 10:40 AM Medical Oncology and Hematology [facility] [place], [place] [address] Tel. [phone]    Fax. [phone]

## 2021-05-31 NOTE — Telephone Encounter (Signed)
Received call from Dominica from [facility] path. She states the pathologist reviewed slide and PDL1 can not be added as there is not enough tissue. Wilber Bihari, NP aware.

## 2021-05-31 NOTE — Telephone Encounter (Signed)
Called and gave patient appt time for lab visit tomorrow 06/01/2021 at [place] with Guardant 360 testing that is needed to complete her lung cancer work up.  I reviewed with her that her pathology from her biopsy did not have enough tissue for additional testing.  I reviewed that it takes about a week for results, and our goal is to get this testing completed and ready for her initial appointment with Dr. Julien Nordmann.  Patient verbalized understanding and appt date/time at [facility] location.  She knows to call if anything comes up so we can reschedule and get this lab drawn promptly.  Wilber Bihari, NP

## 2021-05-31 NOTE — Telephone Encounter (Signed)
This LPN called and spoke with [facility] path regarding pt's 04/24/2021 [facility] path for lung, per Wilber Bihari, can a PDL1 be added on to this tissue sample? Pathology states they will pull slide and evaluate tissue sample to see if it can be added on and will return my call.

## 2021-05-31 NOTE — Telephone Encounter (Signed)
I received a message from NP Mendel Ryder that patient needs to be seen with Dr. Julien Nordmann. I called patient and updated her on appt. She verbalized understanding.

## 2021-06-01 ENCOUNTER — Inpatient Hospital Stay: Payer: Commercial Managed Care - PPO

## 2021-06-14 ENCOUNTER — Encounter: Payer: Self-pay | Admitting: *Deleted

## 2021-06-14 ENCOUNTER — Other Ambulatory Visit: Payer: Self-pay

## 2021-06-14 ENCOUNTER — Inpatient Hospital Stay: Payer: Commercial Managed Care - PPO | Attending: Hematology and Oncology | Admitting: Internal Medicine

## 2021-06-14 ENCOUNTER — Inpatient Hospital Stay: Payer: Commercial Managed Care - PPO

## 2021-06-14 ENCOUNTER — Encounter: Payer: Self-pay | Admitting: Emergency Medicine

## 2021-06-14 ENCOUNTER — Other Ambulatory Visit: Payer: Self-pay | Admitting: Physician Assistant

## 2021-06-14 VITALS — BP 110/54 | HR 82 | Temp 97.1°F | Resp 18 | Wt 116.5 lb

## 2021-06-14 DIAGNOSIS — M329 Systemic lupus erythematosus, unspecified: Secondary | ICD-10-CM | POA: Insufficient documentation

## 2021-06-14 DIAGNOSIS — Z5112 Encounter for antineoplastic immunotherapy: Secondary | ICD-10-CM | POA: Insufficient documentation

## 2021-06-14 DIAGNOSIS — C3491 Malignant neoplasm of unspecified part of right bronchus or lung: Secondary | ICD-10-CM | POA: Diagnosis not present

## 2021-06-14 DIAGNOSIS — Z01810 Encounter for preprocedural cardiovascular examination: Secondary | ICD-10-CM | POA: Insufficient documentation

## 2021-06-14 DIAGNOSIS — C3411 Malignant neoplasm of upper lobe, right bronchus or lung: Secondary | ICD-10-CM | POA: Insufficient documentation

## 2021-06-14 DIAGNOSIS — Z5111 Encounter for antineoplastic chemotherapy: Secondary | ICD-10-CM | POA: Diagnosis present

## 2021-06-14 HISTORY — DX: Malignant neoplasm of unspecified part of right bronchus or lung: C34.91

## 2021-06-14 HISTORY — DX: Encounter for antineoplastic chemotherapy: Z51.11

## 2021-06-14 LAB — CBC WITH DIFFERENTIAL (CANCER CENTER ONLY)
Abs Immature Granulocytes: 0.02 10*3/uL (ref 0.00–0.07)
Basophils Absolute: 0.1 10*3/uL (ref 0.0–0.1)
Basophils Relative: 1 %
Eosinophils Absolute: 0.3 10*3/uL (ref 0.0–0.5)
Eosinophils Relative: 3 %
HCT: 42.5 % (ref 36.0–46.0)
Hemoglobin: 14.1 g/dL (ref 12.0–15.0)
Immature Granulocytes: 0 %
Lymphocytes Relative: 36 %
Lymphs Abs: 3.4 10*3/uL (ref 0.7–4.0)
MCH: 31.1 pg (ref 26.0–34.0)
MCHC: 33.2 g/dL (ref 30.0–36.0)
MCV: 93.8 fL (ref 80.0–100.0)
Monocytes Absolute: 0.7 10*3/uL (ref 0.1–1.0)
Monocytes Relative: 7 %
Neutro Abs: 5.1 10*3/uL (ref 1.7–7.7)
Neutrophils Relative %: 53 %
Platelet Count: 226 10*3/uL (ref 150–400)
RBC: 4.53 MIL/uL (ref 3.87–5.11)
RDW: 12.8 % (ref 11.5–15.5)
WBC Count: 9.5 10*3/uL (ref 4.0–10.5)
nRBC: 0 % (ref 0.0–0.2)

## 2021-06-14 LAB — CMP (CANCER CENTER ONLY)
ALT: 34 U/L (ref 0–44)
AST: 31 U/L (ref 15–41)
Albumin: 4.1 g/dL (ref 3.5–5.0)
Alkaline Phosphatase: 101 U/L (ref 38–126)
Anion gap: 9 (ref 5–15)
BUN: 6 mg/dL (ref 6–20)
CO2: 25 mmol/L (ref 22–32)
Calcium: 9.6 mg/dL (ref 8.9–10.3)
Chloride: 108 mmol/L (ref 98–111)
Creatinine: 0.74 mg/dL (ref 0.44–1.00)
GFR, Estimated: >60 mL/min (ref >60)
Glucose, Bld: 91 mg/dL (ref 70–99)
Potassium: 3.8 mmol/L (ref 3.5–5.1)
Sodium: 142 mmol/L (ref 135–145)
Total Bilirubin: 0.2 mg/dL — ABNORMAL LOW (ref 0.3–1.2)
Total Protein: 7.8 g/dL (ref 6.5–8.1)

## 2021-06-14 MED ORDER — CYANOCOBALAMIN 1000 MCG/ML IJ SOLN: 1000.0000 ug | Freq: Once | INTRAMUSCULAR | Status: DC

## 2021-06-14 MED ORDER — FOLIC ACID 1 MG PO TABS: 1.0000 mg | ORAL_TABLET | Freq: Every day | ORAL | 4 refills | Status: DC

## 2021-06-14 MED ORDER — CYANOCOBALAMIN 1000 MCG/ML IJ SOLN
1000.0000 ug | Freq: Once | INTRAMUSCULAR | Status: AC
  Administered 2021-06-14: 1000 ug via INTRAMUSCULAR

## 2021-06-14 MED ORDER — DEXAMETHASONE 4 MG PO TABS: ORAL_TABLET | ORAL | 1 refills | Status: DC

## 2021-06-14 MED ORDER — CYANOCOBALAMIN 1000 MCG/ML IJ SOLN
INTRAMUSCULAR | Status: AC
  Filled 2021-06-14: qty 1

## 2021-06-14 NOTE — Progress Notes (Signed)
I spoke to Ms. Kirsten Williams today at her first visit with Dr. Julien Nordmann. She is newly dx lung cancer.  Her plan of care is to check molecular test results and then systemic therapy.  I gave her information on lung cancer and [facility] is working of follow up testing results.

## 2021-06-14 NOTE — Progress Notes (Signed)
[place] [phone]   [phone]  CONSULT NOTE  REFERRING PHYSICIAN: Dr. Heath Lark.  REASON FOR CONSULTATION:  50 years old white female recently diagnosed with lung cancer and want to establish care in [place].  HPI ALBERTO SCHOCH is a 50 y.o. female with past medical history significant for systemic lupus erythematosus, dyslipidemia and GERD.  The patient mentioned that on July 14, 2018 she had CT scan of the chest that showed suspicious pulmonary nodules.  Repeat CT scan of the chest with contrast on February 16, 2021 showed progressive partially solid nodules bilaterally raising concern for similar invasive adenocarcinoma.   The patient had a PET scan on March 06 2021 and it showed some of the ground glass density nodules are mildly hypermetabolic including right upper lobe soft solid nodule with maximum SUV of 2.5.  The appearance is suspicious for multifocal low-grade adenocarcinoma.  There was mildly hypermetabolic upper normal size lower paratracheal lymph node with maximum SUV of 4.0 compared to the background and malignant involvement is difficult to exclude.  The patient was seen by Dr. Simeon Craft such who repeated a PET scan on May 29, 2021 and it showed relatively similar size of the bilateral solid and soft solid pulmonary nodules most consistent with multifocal low-grade adenocarcinoma similar and mildly decreased hypermetabolism.  There was similar size and mild hypermetabolism of her right paratracheal node that is indeterminate and no hypermetabolic extrathoracic metastatic disease and the patient has bilateral adrenal adenomas.  She also had MRI of the brain that was negative for metastatic disease to the brain. The patient was seen by Dr. Lamonte Sakai and on Apr 24, 2021 she underwent video bronchoscopy with electromagnetic navigation procedure.  The final pathology ([id number]) of the right upper lobe nodule fine-needle aspiration showed  malignant cells consistent with adenocarcinoma.  The right lower lobe brushings showed atypical cells present and the right lower lobe nodule fine-needle aspiration showed malignant cells consistent with adenocarcinoma.  There was insufficient material for molecular studies.  The patient had blood test sent to [facility] and it showed no detectable mutation which could be due to the absence of mutation or the low level of circulating tumor free DNA. The patient and her husband were upset about the delay of care care she received in [place] and she requested her care to be transferred to [facility]. When seen today she is feeling fine with no concerning complaints except for mild cough.  She also has occasional diarrhea but no significant chest pain, shortness of breath or hemoptysis.  She has no nausea, vomiting, abdominal pain or constipation.  She has occasional blurry vision with mild headache.  She has no weight loss or night sweats. Family history significant for mother who had a stroke at age 42 Father had melanoma and stroke and sister had lung cancer. The patient is married and has 3 daughters and 4 grandchildren.  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  She has a history for smoking 2 pack/day for around 25 years and quit in January 2016.  She has no history of alcohol or drug abuse.  HPI  Past Medical History:  Diagnosis Date   Adenocarcinoma, lung, right ([place]) 05/03/2021   LOREA KUPFER is a 50 y.o. female with a history of lung nodules dating back to 2019 who is referred in consultation with Cyndi Bender, PA-C for assessment and management. She is a former smoker having quit in 2016. To  date, nodules have been stable as well as likely adrenal adenomas. She missed her screening CT last year and imaging was ordered last month. CT chest from 02-16-2021 reveals pro   Anxiety    GERD (gastroesophageal reflux disease)    SLE (systemic lupus erythematosus) ([place])  05/03/2021   SLE (systemic lupus erythematosus) ([place]) 05/03/2021    Past Surgical History:  Procedure Laterality Date   ABDOMINAL HYSTERECTOMY     BRONCHIAL BIOPSY  04/24/2021   Procedure: BRONCHIAL BIOPSIES;  Surgeon: Collene Gobble, MD;  Location: [place];  Service: Pulmonary;;   BRONCHIAL BRUSHINGS  04/24/2021   Procedure: BRONCHIAL BRUSHINGS;  Surgeon: Collene Gobble, MD;  Location: [facility] ENDOSCOPY;  Service: Pulmonary;;   BRONCHIAL NEEDLE ASPIRATION BIOPSY  04/24/2021   Procedure: BRONCHIAL NEEDLE ASPIRATION BIOPSIES;  Surgeon: Collene Gobble, MD;  Location: [place];  Service: Pulmonary;;   BRONCHIAL WASHINGS  04/24/2021   Procedure: BRONCHIAL WASHINGS;  Surgeon: Collene Gobble, MD;  Location: [place];  Service: Pulmonary;;   CESAREAN SECTION  11/09/1990   DILATION AND CURETTAGE OF UTERUS Bilateral 09/09/1996   FIDUCIAL MARKER PLACEMENT  04/24/2021   Procedure: FIDUCIAL MARKER PLACEMENT;  Surgeon: Collene Gobble, MD;  Location: [facility] ENDOSCOPY;  Service: Pulmonary;;   TONSILLECTOMY  12/10/1977   VIDEO BRONCHOSCOPY WITH ENDOBRONCHIAL NAVIGATION Bilateral 04/24/2021   Procedure: VIDEO BRONCHOSCOPY WITH ENDOBRONCHIAL NAVIGATION;  Surgeon: Collene Gobble, MD;  Location: [facility] ENDOSCOPY;  Service: Pulmonary;  Laterality: Bilateral;    Family History  Problem Relation Age of Onset   Stroke Mother    Stroke Father    Cancer Father    Hypertension Sister     Social History Social History   Tobacco Use   Smoking status: Former    Packs/day: 1.50    Years: 32.00    Pack years: 48.00    Types: Cigarettes    Quit date: 12/10/2014    Years since quitting: 6.5   Smokeless tobacco: Never  Vaping Use   Vaping Use: Never used  Substance Use Topics   Alcohol use: Yes    Alcohol/week: 3.0 standard drinks    Types: 3 Shots of liquor per week    Comment: daily   Drug use: Never    No Known Allergies  Current Outpatient Medications  Medication Sig Dispense Refill    acetaminophen (TYLENOL) 325 MG tablet Take 975 mg by mouth every 6 (six) hours as needed for moderate pain or headache.     amoxicillin (AMOXIL) 500 MG capsule Take 500 mg by mouth 3 (three) times daily.     cyclobenzaprine (FLEXERIL) 10 MG tablet Take 10 mg by mouth at bedtime as needed for muscle spasms.     diphenhydrAMINE (BENADRYL) 25 MG tablet Take 25 mg by mouth daily as needed for allergies.     HYDROcodone-acetaminophen (NORCO/VICODIN) 5-325 MG tablet Take 1 tablet by mouth every 6 (six) hours as needed for moderate pain. 30 tablet 0   hydroxychloroquine (PLAQUENIL) 200 MG tablet Take 200 mg by mouth 2 (two) times daily.     Multiple Vitamins-Minerals (MULTI ADULT GUMMIES) CHEW Chew 2 tablets by mouth daily.     omeprazole (PRILOSEC) 40 MG capsule Take 40 mg by mouth daily as needed (acid reflux).     ondansetron (ZOFRAN) 4 MG tablet Take 8 mg by mouth 2 (two) times daily as needed for vomiting or nausea.     No current facility-administered medications for this visit.    Review of Systems  Constitutional: positive for fatigue Eyes: negative Ears, nose, mouth, throat, and face: negative Respiratory: positive for cough Cardiovascular: negative Gastrointestinal: negative Genitourinary:negative Integument/breast: negative Hematologic/lymphatic: negative Musculoskeletal:negative Neurological: negative Behavioral/Psych: negative Endocrine: negative Allergic/Immunologic: negative  Physical Exam  UVO:ZDGUY, healthy, no distress, well nourished, well developed, and anxious SKIN: skin color, texture, turgor are normal, no rashes or significant lesions HEAD: Normocephalic, No masses, lesions, tenderness or abnormalities EYES: normal, PERRLA, Conjunctiva are pink and non-injected EARS: External ears normal OROPHARYNX:no exudate, no erythema, and lips, buccal mucosa, and tongue normal  NECK: supple, no adenopathy, no JVD LYMPH:  no palpable lymphadenopathy, no  hepatosplenomegaly BREAST:not examined LUNGS: clear to auscultation , and palpation HEART: regular rate & rhythm, no murmurs, and no gallops ABDOMEN:abdomen soft, non-tender, normal bowel sounds, and no masses or organomegaly BACK: Back symmetric, no curvature., No CVA tenderness EXTREMITIES:no joint deformities, effusion, or inflammation, no edema  NEURO: alert & oriented x 3 with fluent speech, no focal motor/sensory deficits  PERFORMANCE STATUS: ECOG 1  LABORATORY DATA: Lab Results  Component Value Date   WBC 9.5 06/14/2021   HGB 14.1 06/14/2021   HCT 42.5 06/14/2021   MCV 93.8 06/14/2021   PLT 226 06/14/2021      Chemistry      Component Value Date/Time   NA 138 04/12/2021 1022   NA 140 03/13/2021 0000   K 4.8 04/12/2021 1022   CL 104 04/12/2021 1022   CO2 30 04/12/2021 1022   BUN 11 04/12/2021 1022   BUN 10 03/13/2021 0000   CREATININE 0.72 04/12/2021 1022   GLU 107 03/13/2021 0000      Component Value Date/Time   CALCIUM 9.9 04/12/2021 1022   ALKPHOS 89 03/13/2021 0000   AST 50 (A) 03/13/2021 0000   ALT 51 (A) 03/13/2021 0000       RADIOGRAPHIC STUDIES: No results found.  ASSESSMENT: This is a very pleasant 50 years old white female recently diagnosed with stage IV (T4, N2, M1 a) non-small cell lung cancer, adenocarcinoma presented with multifocal disease involving the right upper lobe, right lower lobe as well as suspicious lower paratracheal lymphadenopathy and groundglass nodules in the left lung diagnosed in May 2022. The patient had molecular studies performed by [facility] and that showed no detectable mutation but this is likely secondary to low-level circulating free tumor DNA.   PLAN: I had a lengthy discussion with the patient today about her current disease stage, prognosis and treatment options. I personally and independently reviewed the scan images and discussed the results and showed the images to the patient today. I explained to the patient  that she has incurable condition and all the treatment will be of palliative nature. I discussed with the patient the option of local treatment with SBRT to the multiple pulmonary nodules but because of the multifocal disease I think she will need systemic therapy and she is in agreement with this option. She was giving the option of palliative care versus palliative systemic chemotherapy with carboplatin for AUC of 5, Alimta 500 Mg/M2 and Avastin 15 Mg/KG every 3 weeks.  The patient is not a great candidate for immunotherapy because of her history of active systemic lupus erythematosus. I discussed with her the adverse effect of this treatment including but not limited to alopecia, myelosuppression, nausea and vomiting, peripheral neuropathy, liver or renal dysfunction. She is expected to start the first cycle of this treatment next week. The patient will receive vitamin B12 injection today. I will call her pharmacy with  prescription for folic acid 1 mg p.o. daily in addition to Compazine 10 mg p.o. every 6 hours as needed for nausea and Decadron 4 mg p.o. twice daily the day before, day of and day after chemotherapy every 3 weeks. She will come back for follow-up visit in 2 weeks for evaluation and management of any adverse effect of her treatment. The patient will have a chemotherapy education class before the first dose of her treatment. She was advised to call immediately if she has any other concerning symptoms in the interval.  The patient voices understanding of current disease status and treatment options and is in agreement with the current care plan.  All questions were answered. The patient knows to call the clinic with any problems, questions or concerns. We can certainly see the patient much sooner if necessary.  Thank you so much for allowing me to participate in the care of [name]. I will continue to follow up the patient with you and assist in her care.  The total time  spent in the appointment was 90 minutes.  Disclaimer: This note was dictated with voice recognition software. Similar sounding words can inadvertently be transcribed and may not be corrected upon review.   Eilleen Kempf June 14, 2021, 1:54 PM

## 2021-06-14 NOTE — Patient Instructions (Signed)
Thank you for choosing [facility] to provide your care.   Should you have questions after your visit to the [facility] ([facility]), please contact this office at [phone] between 8:30 AM and 4:30 PM.  Voice mails left after 4:00 PM may not be returned until the following business day.  Calls received after 4:30 PM will be answered by an off-site Nurse Triage Line.    Prescription Refills:  Please have your pharmacy contact us directly for most prescription requests.  Contact the office directly for refills of narcotics (pain medications). Allow 48-72 hours for refills.  Appointments: Please contact the [facility] scheduling department [phone] for questions regarding [facility] appointment scheduling.  Contact the schedulers with any scheduling changes so that your appointment can be rescheduled in a timely manner.   Central Scheduling for [place] [phone] - Call to schedule procedures such as PET scans, CT scans, MRI, Ultrasound, etc.  To afford each patient quality time with our providers, please arrive 30 minutes before your scheduled appointment time.  If you arrive late for your appointment, you may be asked to reschedule.  We strive to give you quality time with our providers, and arriving late affects you and other patients whose appointments are after yours. If you are a no show for multiple scheduled visits, you may be dismissed from the clinic at the providers discretion.     Resources: [facility] Social Workers 336-832-0950 for additional information on assistance programs or assistance connecting with community support programs   [place] DSS  336-641-3447: Information regarding food stamps, Medicaid, and utility assistance [place] [employer] [place] 336-333-6589   [place] [employer]'s shared-ride transportation service for eligible riders who have a disability that prevents them from riding the fixed route bus.   Medicare Rights Center 800-333-4114  Helps people with Medicare understand their rights and benefits, navigate the Medicare system, and secure the quality healthcare they deserve [employer] 800-227-2345 Assists patients locate various types of support and financial assistance Cancer Care: 1-800-813-HOPE (4673) Provides financial assistance, online support groups, medication/co-pay assistance.   Transportation Assistance for appointments at [facility]: Transportation Coordinator [phone]  Again, thank you for choosing [facility] for your care.       

## 2021-06-14 NOTE — Progress Notes (Signed)
START ON PATHWAY REGIMEN - Non-Small Cell Lung     A cycle is every 21 days:     Paclitaxel      Carboplatin      Bevacizumab-xxxx   **Always confirm dose/schedule in your pharmacy ordering system**  Patient Characteristics: Stage IV Metastatic, Nonsquamous, Awaiting Molecular Test Results and Need to Start Chemotherapy, PS = 0, 1 Therapeutic Status: Stage IV Metastatic Histology: Nonsquamous Cell Broad Molecular Profiling Status: Awaiting Molecular Test Results and Need to Start Chemotherapy ECOG Performance Status: 1 Intent of Therapy: Non-Curative / Palliative Intent, Discussed with Patient

## 2021-06-14 NOTE — Research (Signed)
[facility] - Customer service manager for the Discovery and Validation of Biomarkers for the Prediction, Diagnosis, and Management of Disease  Patient Kirsten Williams was identified by this Research officer, political party as a potential candidate for the above listed study.  This Clinical Research Coordinator met with AINARA ELDRIDGE, MRN [id number], on 06/14/21 in a manner and location that ensures patient privacy to discuss participation in the above listed research study.  Patient is Unaccompanied.  A copy of the informed consent document and separate HIPAA Authorization was provided to the patient.  Patient reads, speaks, and understands [place].    Patient was provided with the business card of this Coordinator and encouraged to contact the research team with any questions.  Approximately 10 minutes were spent with the patient reviewing the informed consent documents.  Patient was provided the option of taking informed consent documents home to review and was encouraged to review at their convenience with their support network, including other care providers. Patient took the consent documents home to review.  Clabe Seal Clinical Research Coordinator I  06/14/21 2:57 PM

## 2021-06-14 NOTE — Progress Notes (Signed)
DISCONTINUE ON PATHWAY REGIMEN - Non-Small Cell Lung     A cycle is every 21 days:     Paclitaxel      Carboplatin      Bevacizumab-xxxx   **Always confirm dose/schedule in your pharmacy ordering system**  REASON: Other Reason PRIOR TREATMENT: [id number]: Carboplatin AUC=6 + Paclitaxel 200 mg/m2 + Bevacizumab 15 mg/kg q21 Days x 1 Cycle TREATMENT RESPONSE: Unable to Evaluate  START ON PATHWAY REGIMEN - Non-Small Cell Lung     A cycle is every 21 days:     Pemetrexed      Carboplatin      Bevacizumab-xxxx   **Always confirm dose/schedule in your pharmacy ordering system**  Patient Characteristics: Stage IV Metastatic, Nonsquamous, Awaiting Molecular Test Results and Need to Start Chemotherapy, PS = 0, 1 Therapeutic Status: Stage IV Metastatic Histology: Nonsquamous Cell Broad Molecular Profiling Status: Awaiting Molecular Test Results and Need to Start Chemotherapy ECOG Performance Status: 1 Intent of Therapy: Non-Curative / Palliative Intent, Discussed with Patient

## 2021-06-16 ENCOUNTER — Telehealth: Payer: Self-pay | Admitting: Internal Medicine

## 2021-06-16 NOTE — Telephone Encounter (Signed)
Scheduled per 07/06 los, patient has been called and voicemail was left.

## 2021-06-19 ENCOUNTER — Other Ambulatory Visit: Payer: Self-pay

## 2021-06-19 ENCOUNTER — Inpatient Hospital Stay: Payer: Commercial Managed Care - PPO

## 2021-06-19 ENCOUNTER — Other Ambulatory Visit: Payer: Self-pay | Admitting: Physician Assistant

## 2021-06-19 ENCOUNTER — Encounter: Payer: Self-pay | Admitting: Internal Medicine

## 2021-06-19 DIAGNOSIS — C3491 Malignant neoplasm of unspecified part of right bronchus or lung: Secondary | ICD-10-CM

## 2021-06-19 MED ORDER — PROCHLORPERAZINE MALEATE 10 MG PO TABS: 10.0000 mg | ORAL_TABLET | Freq: Four times a day (QID) | ORAL | 2 refills | Status: DC | PRN

## 2021-06-19 NOTE — Progress Notes (Signed)
Met with patient at registration to introduce myself as Financial Resource Specialist and to offer available resources.  Discussed one-time $1000 [employer] grant and qualifications to assist with personal expenses while going through treatment. Patient quoted gross household income and does not qualify for the grant.  Gave her my card if interested in applying and for any additional financial questions or concerns.  

## 2021-06-19 NOTE — Progress Notes (Signed)
Also, discussed available copay assistance for specific treatment drug if insurance leaves her with a deductible or OOP for that particular treatment drug. She thinks she has met everything.   She has my card for any additional financial questions or concerns.

## 2021-06-20 ENCOUNTER — Telehealth: Payer: Self-pay | Admitting: Emergency Medicine

## 2021-06-20 NOTE — Telephone Encounter (Signed)
[facility] - Customer service manager for the Discovery and Validation of Biomarkers for the Prediction, Diagnosis, and Management of Disease  1:37pm: Called patient concerning this research study.  Patient states she is interested in participating.  Scheduled appointment to consent tomorrow prior to her already scheduled lab appointment.    Will plan to collect research specimens at already scheduled lab appointment after patient consents to the study.  The patient denied questions at this time.  She was thanked for time.  Clabe Seal Clinical Research Coordinator I  06/20/21  1:42 PM

## 2021-06-21 ENCOUNTER — Other Ambulatory Visit: Payer: Self-pay

## 2021-06-21 ENCOUNTER — Other Ambulatory Visit: Payer: Self-pay | Admitting: *Deleted

## 2021-06-21 ENCOUNTER — Inpatient Hospital Stay: Payer: Commercial Managed Care - PPO

## 2021-06-21 ENCOUNTER — Inpatient Hospital Stay: Payer: Commercial Managed Care - PPO | Admitting: Emergency Medicine

## 2021-06-21 VITALS — BP 112/72 | HR 94 | Temp 98.4°F | Resp 18 | Wt 116.5 lb

## 2021-06-21 DIAGNOSIS — Z5112 Encounter for antineoplastic immunotherapy: Secondary | ICD-10-CM | POA: Diagnosis not present

## 2021-06-21 DIAGNOSIS — C3491 Malignant neoplasm of unspecified part of right bronchus or lung: Secondary | ICD-10-CM

## 2021-06-21 DIAGNOSIS — Z006 Encounter for examination for normal comparison and control in clinical research program: Secondary | ICD-10-CM

## 2021-06-21 LAB — CBC WITH DIFFERENTIAL (CANCER CENTER ONLY)
Abs Immature Granulocytes: 0.02 10*3/uL (ref 0.00–0.07)
Basophils Absolute: 0 10*3/uL (ref 0.0–0.1)
Basophils Relative: 0 %
Eosinophils Absolute: 0 10*3/uL (ref 0.0–0.5)
Eosinophils Relative: 0 %
HCT: 38.8 % (ref 36.0–46.0)
Hemoglobin: 13 g/dL (ref 12.0–15.0)
Immature Granulocytes: 0 %
Lymphocytes Relative: 8 %
Lymphs Abs: 1 10*3/uL (ref 0.7–4.0)
MCH: 30.9 pg (ref 26.0–34.0)
MCHC: 33.5 g/dL (ref 30.0–36.0)
MCV: 92.2 fL (ref 80.0–100.0)
Monocytes Absolute: 0.4 10*3/uL (ref 0.1–1.0)
Monocytes Relative: 3 %
Neutro Abs: 12.3 10*3/uL — ABNORMAL HIGH (ref 1.7–7.7)
Neutrophils Relative %: 89 %
Platelet Count: 245 10*3/uL (ref 150–400)
RBC: 4.21 MIL/uL (ref 3.87–5.11)
RDW: 12.5 % (ref 11.5–15.5)
WBC Count: 13.8 10*3/uL — ABNORMAL HIGH (ref 4.0–10.5)
nRBC: 0 % (ref 0.0–0.2)

## 2021-06-21 LAB — CMP (CANCER CENTER ONLY)
ALT: 19 U/L (ref 0–44)
AST: 15 U/L (ref 15–41)
Albumin: 4 g/dL (ref 3.5–5.0)
Alkaline Phosphatase: 92 U/L (ref 38–126)
Anion gap: 11 (ref 5–15)
BUN: 12 mg/dL (ref 6–20)
CO2: 24 mmol/L (ref 22–32)
Calcium: 10.3 mg/dL (ref 8.9–10.3)
Chloride: 104 mmol/L (ref 98–111)
Creatinine: 0.72 mg/dL (ref 0.44–1.00)
GFR, Estimated: >60 mL/min (ref >60)
Glucose, Bld: 98 mg/dL (ref 70–99)
Potassium: 3.7 mmol/L (ref 3.5–5.1)
Sodium: 139 mmol/L (ref 135–145)
Total Bilirubin: 0.2 mg/dL — ABNORMAL LOW (ref 0.3–1.2)
Total Protein: 7.6 g/dL (ref 6.5–8.1)

## 2021-06-21 LAB — RESEARCH LABS

## 2021-06-21 LAB — TOTAL PROTEIN, URINE DIPSTICK: Protein, ur: NEGATIVE mg/dL

## 2021-06-21 MED ORDER — SODIUM CHLORIDE 0.9 % IV SOLN
10.0000 mg | Freq: Once | INTRAVENOUS | Status: AC
  Administered 2021-06-21: 10 mg via INTRAVENOUS
  Filled 2021-06-21: qty 10

## 2021-06-21 MED ORDER — SODIUM CHLORIDE 0.9 % IV SOLN
15.0000 mg/kg | Freq: Once | INTRAVENOUS | Status: AC
  Administered 2021-06-21: 800 mg via INTRAVENOUS
  Filled 2021-06-21: qty 32

## 2021-06-21 MED ORDER — SODIUM CHLORIDE 0.9 % IV SOLN
Freq: Once | INTRAVENOUS | Status: AC
  Filled 2021-06-21: qty 250

## 2021-06-21 MED ORDER — PALONOSETRON HCL INJECTION 0.25 MG/5ML
0.2500 mg | Freq: Once | INTRAVENOUS | Status: AC
  Administered 2021-06-21: 0.25 mg via INTRAVENOUS

## 2021-06-21 MED ORDER — SODIUM CHLORIDE 0.9 % IV SOLN
500.0000 mg/m2 | Freq: Once | INTRAVENOUS | Status: AC
  Administered 2021-06-21: 750 mg via INTRAVENOUS
  Filled 2021-06-21: qty 20

## 2021-06-21 MED ORDER — PALONOSETRON HCL INJECTION 0.25 MG/5ML
INTRAVENOUS | Status: AC
  Filled 2021-06-21: qty 5

## 2021-06-21 MED ORDER — SODIUM CHLORIDE 0.9 % IV SOLN
475.5000 mg | Freq: Once | INTRAVENOUS | Status: AC
  Administered 2021-06-21: 480 mg via INTRAVENOUS
  Filled 2021-06-21: qty 48

## 2021-06-21 MED ORDER — SODIUM CHLORIDE 0.9 % IV SOLN
150.0000 mg | Freq: Once | INTRAVENOUS | Status: AC
  Administered 2021-06-21: 150 mg via INTRAVENOUS
  Filled 2021-06-21: qty 150

## 2021-06-21 NOTE — Research (Signed)
[facility] - Customer service manager for the Discovery and Validation of Biomarkers for the Prediction, Diagnosis, and Management of Disease  Consent: Patient Kirsten Williams was identified by this Research officer, political party as a potential candidate for the above listed study.  This Clinical Research Coordinator met with INETA SINNING, [id number] on 06/21/21 in a manner and location that ensures patient privacy to discuss participation in the above listed research study.  Patient is Unaccompanied.  Patient was previously provided with informed consent documents.  Patient confirmed they have read the informed consent documents.  As outlined in the informed consent form, this Coordinator and ASEES MANFREDI discussed the purpose of the research study, the investigational nature of the study, study procedures and requirements for study participation, potential risks and benefits of study participation, as well as alternatives to participation.  Patient understands enrollment is pending full eligibility review.   Confidentiality and how the patient's information will be used as part of study participation were discussed.  Patient was informed there is reimbursement provided for their time and effort spent on trial participation.  The patient is encouraged to discuss research study participation with their insurance provider to determine what costs they may incur as part of study participation, including research related injury.    All questions were answered to patient's satisfaction.  The informed consent and separate HIPAA Authorization was reviewed page by page.  The patient's mental and emotional status is appropriate to provide informed consent, and the patient verbalizes an understanding of study participation.  Patient has agreed to participate in the above listed research study and has voluntarily signed the informed consent version 1, revised 03/13/21 and separate HIPAA Authorization,  version 5, revised 11/25/19  on 06/21/21 at 12:28PM.  The patient was provided with a copy of the signed informed consent form and separate HIPAA Authorization for their reference.  No study specific procedures were obtained prior to the signing of the informed consent document.  Approximately 15 minutes were spent with the patient reviewing the informed consent documents.    Research Specimen: Research blood specimen was collected by [facility] at 1240 PM.   Gift Card:  Patient was given $50 visa gift card for participation in the study.  The patient was thanked for her time and participation in this study.  Clabe Seal Clinical Research Coordinator I  06/21/21 1:10 PM

## 2021-06-21 NOTE — Patient Instructions (Signed)
[facility]  Discharge Instructions: Thank you for choosing [facility] to provide your oncology and hematology care.   If you have a lab appointment with the [facility], please go directly to the [facility] and check in at the registration area.   Wear comfortable clothing and clothing appropriate for easy access to any Portacath or PICC line.   We strive to give you quality time with your provider. You may need to reschedule your appointment if you arrive late (15 or more minutes).  Arriving late affects you and other patients whose appointments are after yours.  Also, if you miss three or more appointments without notifying the office, you may be dismissed from the clinic at the provider's discretion.      For prescription refill requests, have your pharmacy contact our office and allow 72 hours for refills to be completed.    Today you received the following chemotherapy and/or immunotherapy agents: Avastin, Alimta, Carboplatin      To help prevent nausea and vomiting after your treatment, we encourage you to take your nausea medication as directed.  BELOW ARE SYMPTOMS THAT SHOULD BE REPORTED IMMEDIATELY: *FEVER GREATER THAN 100.4 F (38 C) OR HIGHER *CHILLS OR SWEATING *NAUSEA AND VOMITING THAT IS NOT CONTROLLED WITH YOUR NAUSEA MEDICATION *UNUSUAL SHORTNESS OF BREATH *UNUSUAL BRUISING OR BLEEDING *URINARY PROBLEMS (pain or burning when urinating, or frequent urination) *BOWEL PROBLEMS (unusual diarrhea, constipation, pain near the anus) TENDERNESS IN MOUTH AND THROAT WITH OR WITHOUT PRESENCE OF ULCERS (sore throat, sores in mouth, or a toothache) UNUSUAL RASH, SWELLING OR PAIN  UNUSUAL VAGINAL DISCHARGE OR ITCHING   Items with * indicate a potential emergency and should be followed up as soon as possible or go to the Emergency Department if any problems should occur.  Please show the CHEMOTHERAPY ALERT CARD or IMMUNOTHERAPY ALERT  CARD at check-in to the Emergency Department and triage nurse.  Should you have questions after your visit or need to cancel or reschedule your appointment, please contact [facility]  Dept: [phone]  and follow the prompts.  Office hours are 8:00 a.m. to 4:30 p.m. Monday - Friday. Please note that voicemails left after 4:00 p.m. may not be returned until the following business day.  We are closed weekends and major holidays. You have access to a nurse at all times for urgent questions. Please call the main number to the clinic Dept: [phone] and follow the prompts.   For any non-urgent questions, you may also contact your provider using MyChart. We now offer e-Visits for anyone 16 and older to request care online for non-urgent symptoms. For details visit [email or web address].   Also download the MyChart app! Go to the app store, search "MyChart", open the app, select [facility], and log in with your MyChart username and password.  Due to Covid, a mask is required upon entering the hospital/clinic. If you do not have a mask, Williams will be given to you upon arrival. For doctor visits, patients may have 1 support person aged 7 or older with them. For treatment visits, patients cannot have anyone with them due to current Covid guidelines and our immunocompromised population.   Bevacizumab injection What is this medication? BEVACIZUMAB (be va SIZ yoo mab) is a monoclonal antibody. It is used to treatmany types of cancer. This medicine may be used for other purposes; ask your health care provider orpharmacist if you have questions. COMMON BRAND NAME(S): Avastin, MVASI,  Noah Charon What should I tell my care team before I take this medication? They need to know if you have any of these conditions: diabetes heart disease high blood pressure history of coughing up blood prior anthracycline chemotherapy (e.g., doxorubicin, daunorubicin, epirubicin) recent or  ongoing radiation therapy recent or planning to have surgery stroke an unusual or allergic reaction to bevacizumab, hamster proteins, mouse proteins, other medicines, foods, dyes, or preservatives pregnant or trying to get pregnant breast-feeding How should I use this medication? This medicine is for infusion into a vein. It is given by a health careprofessional in a hospital or clinic setting. Talk to your pediatrician regarding the use of this medicine in children.Special care may be needed. Overdosage: If you think you have taken too much of this medicine contact apoison control center or emergency room at once. NOTE: This medicine is only for you. Do not share this medicine with others. What if I miss a dose? It is important not to miss your dose. Call your doctor or health careprofessional if you are unable to keep an appointment. What may interact with this medication? Interactions are not expected. This list may not describe all possible interactions. Give your health care provider a list of all the medicines, herbs, non-prescription drugs, or dietary supplements you use. Also tell them if you smoke, drink alcohol, or use illegaldrugs. Some items may interact with your medicine. What should I watch for while using this medication? Your condition will be monitored carefully while you are receiving this medicine. You will need important blood work and urine testing done while youare taking this medicine. This medicine may increase your risk to bruise or bleed. Call your doctor orhealth care professional if you notice any unusual bleeding. Before having surgery, talk to your health care provider to make sure it is ok. This drug can increase the risk of poor healing of your surgical site or wound. You will need to stop this drug for 28 days before surgery. After surgery, wait at least 28 days before restarting this drug. Make sure the surgical site or wound is healed enough before restarting  this drug. Talk to your health careprovider if questions. Do not become pregnant while taking this medicine or for 6 months after stopping it. Women should inform their doctor if they wish to become pregnant or think they might be pregnant. There is a potential for serious side effects to an unborn child. Talk to your health care professional or pharmacist for more information. Do not breast-feed an infant while taking this medicine andfor 6 months after the last dose. This medicine has caused ovarian failure in some women. This medicine may interfere with the ability to have a child. You should talk to your doctor orhealth care professional if you are concerned about your fertility. What side effects may I notice from receiving this medication? Side effects that you should report to your doctor or health care professionalas soon as possible: allergic reactions like skin rash, itching or hives, swelling of the face, lips, or tongue chest pain or chest tightness chills coughing up blood high fever seizures severe constipation signs and symptoms of bleeding such as bloody or black, tarry stools; red or dark-Cloer urine; spitting up blood or Caris material that looks like coffee grounds; red spots on the skin; unusual bruising or bleeding from the eye, gums, or nose signs and symptoms of a blood clot such as breathing problems; chest pain; severe, sudden headache; pain, swelling, warmth in the leg signs  and symptoms of a stroke like changes in vision; confusion; trouble speaking or understanding; severe headaches; sudden numbness or weakness of the face, arm or leg; trouble walking; dizziness; loss of balance or coordination stomach pain sweating swelling of legs or ankles vomiting weight gain Side effects that usually do not require medical attention (report to yourdoctor or health care professional if they continue or are bothersome): back pain changes in taste decreased appetite dry  skin nausea tiredness This list may not describe all possible side effects. Call your doctor for medical advice about side effects. You may report side effects to FDA at1-800-FDA-1088. Where should I keep my medication? This drug is given in a hospital or clinic and will not be stored at home. NOTE: This sheet is a summary. It may not cover all possible information. If you have questions about this medicine, talk to your doctor, pharmacist, orhealth care provider.  2022 Elsevier/Gold Standard (2019-09-23 10:50:46)  Pemetrexed injection What is this medication? PEMETREXED (PEM e TREX ed) is a chemotherapy drug used to treat lung cancers like non-small cell lung cancer and mesothelioma. It may also be used to treatother cancers. This medicine may be used for other purposes; ask your health care provider orpharmacist if you have questions. COMMON BRAND NAME(S): Alimta What should I tell my care team before I take this medication? They need to know if you have any of these conditions: infection (especially a virus infection such as chickenpox, cold sores, or herpes) kidney disease low blood counts, like low white cell, platelet, or red cell counts lung or breathing disease, like asthma radiation therapy an unusual or allergic reaction to pemetrexed, other medicines, foods, dyes, or preservative pregnant or trying to get pregnant breast-feeding How should I use this medication? This drug is given as an infusion into a vein. It is administered in a hospitalor clinic by a specially trained health care professional. Talk to your pediatrician regarding the use of this medicine in children.Special care may be needed. Overdosage: If you think you have taken too much of this medicine contact apoison control center or emergency room at once. NOTE: This medicine is only for you. Do not share this medicine with others. What if I miss a dose? It is important not to miss your dose. Call your doctor or  health careprofessional if you are unable to keep an appointment. What may interact with this medication? This medicine may interact with the following medications: Ibuprofen This list may not describe all possible interactions. Give your health care provider a list of all the medicines, herbs, non-prescription drugs, or dietary supplements you use. Also tell them if you smoke, drink alcohol, or use illegaldrugs. Some items may interact with your medicine. What should I watch for while using this medication? Visit your doctor for checks on your progress. This drug may make you feel generally unwell. This is not uncommon, as chemotherapy can affect healthy cells as well as cancer cells. Report any side effects. Continue your course oftreatment even though you feel ill unless your doctor tells you to stop. In some cases, you may be given additional medicines to help with side effects.Follow all directions for their use. Call your doctor or health care professional for advice if you get a fever, chills or sore throat, or other symptoms of a cold or flu. Do not treat yourself. This drug decreases your body's ability to fight infections. Try toavoid being around people who are sick. This medicine may increase your risk to bruise  or bleed. Call your doctor orhealth care professional if you notice any unusual bleeding. Be careful brushing and flossing your teeth or using a toothpick because you may get an infection or bleed more easily. If you have any dental work done,tell your dentist you are receiving this medicine. Avoid taking products that contain aspirin, acetaminophen, ibuprofen, naproxen, or ketoprofen unless instructed by your doctor. These medicines may hide afever. Call your doctor or health care professional if you get diarrhea or mouthsores. Do not treat yourself. To protect your kidneys, drink water or other fluids as directed while you aretaking this medicine. Do not become pregnant while  taking this medicine or for 6 months after stopping it. Women should inform their doctor if they wish to become pregnant or think they might be pregnant. Men should not father a child while taking this medicine and for 3 months after stopping it. This may interfere with the ability to father a child. You should talk to your doctor or health care professional if you are concerned about your fertility. There is a potential for serious side effects to an unborn child. Talk to your health care professional or pharmacist for more information. Do not breast-feed an infantwhile taking this medicine or for 1 week after stopping it. What side effects may I notice from receiving this medication? Side effects that you should report to your doctor or health care professionalas soon as possible: allergic reactions like skin rash, itching or hives, swelling of the face, lips, or tongue breathing problems redness, blistering, peeling or loosening of the skin, including inside the mouth signs and symptoms of bleeding such as bloody or black, tarry stools; red or dark-Cantara urine; spitting up blood or Decelles material that looks like coffee grounds; red spots on the skin; unusual bruising or bleeding from the eye, gums, or nose signs and symptoms of infection like fever or chills; cough; sore throat; pain or trouble passing urine signs and symptoms of kidney injury like trouble passing urine or change in the amount of urine signs and symptoms of liver injury like dark yellow or Cowper urine; general ill feeling or flu-like symptoms; light-colored stools; loss of appetite; nausea; right upper belly pain; unusually weak or tired; yellowing of the eyes or skin Side effects that usually do not require medical attention (report to yourdoctor or health care professional if they continue or are bothersome): constipation mouth sores nausea, vomiting unusually weak or tired This list may not describe all possible side effects.  Call your doctor for medical advice about side effects. You may report side effects to FDA at1-800-FDA-1088. Where should I keep my medication? This drug is given in a hospital or clinic and will not be stored at home. NOTE: This sheet is a summary. It may not cover all possible information. If you have questions about this medicine, talk to your doctor, pharmacist, orhealth care provider.  2022 Elsevier/Gold Standard (2018-01-15 16:11:33)  Carboplatin injection What is this medication? CARBOPLATIN (KAR boe pla tin) is a chemotherapy drug. It targets fast dividing cells, like cancer cells, and causes these cells to die. This medicine is usedto treat ovarian cancer and many other cancers. This medicine may be used for other purposes; ask your health care provider orpharmacist if you have questions. COMMON BRAND NAME(S): Paraplatin What should I tell my care team before I take this medication? They need to know if you have any of these conditions: blood disorders hearing problems kidney disease recent or ongoing radiation therapy an unusual  or allergic reaction to carboplatin, cisplatin, other chemotherapy, other medicines, foods, dyes, or preservatives pregnant or trying to get pregnant breast-feeding How should I use this medication? This drug is usually given as an infusion into a vein. It is administered in [facility] or clinic by a specially trained health care professional. Talk to your pediatrician regarding the use of this medicine in children.Special care may be needed. Overdosage: If you think you have taken too much of this medicine contact apoison control center or emergency room at once. NOTE: This medicine is only for you. Do not share this medicine with others. What if I miss a dose? It is important not to miss a dose. Call your doctor or health careprofessional if you are unable to keep an appointment. What may interact with this medication? medicines for seizures medicines  to increase blood counts like filgrastim, pegfilgrastim, sargramostim some antibiotics like amikacin, gentamicin, neomycin, streptomycin, tobramycin vaccines Talk to your doctor or health care professional before taking any of thesemedicines: acetaminophen aspirin ibuprofen ketoprofen naproxen This list may not describe all possible interactions. Give your health care provider a list of all the medicines, herbs, non-prescription drugs, or dietary supplements you use. Also tell them if you smoke, drink alcohol, or use illegaldrugs. Some items may interact with your medicine. What should I watch for while using this medication? Your condition will be monitored carefully while you are receiving this medicine. You will need important blood work done while you are taking thismedicine. This drug may make you feel generally unwell. This is not uncommon, as chemotherapy can affect healthy cells as well as cancer cells. Report any side effects. Continue your course of treatment even though you feel ill unless yourdoctor tells you to stop. In some cases, you may be given additional medicines to help with side effects.Follow all directions for their use. Call your doctor or health care professional for advice if you get a fever, chills or sore throat, or other symptoms of a cold or flu. Do not treat yourself. This drug decreases your body's ability to fight infections. Try toavoid being around people who are sick. This medicine may increase your risk to bruise or bleed. Call your doctor orhealth care professional if you notice any unusual bleeding. Be careful brushing and flossing your teeth or using a toothpick because you may get an infection or bleed more easily. If you have any dental work done,tell your dentist you are receiving this medicine. Avoid taking products that contain aspirin, acetaminophen, ibuprofen, naproxen, or ketoprofen unless instructed by your doctor. These medicines may hide afever. Do  not become pregnant while taking this medicine. Women should inform their doctor if they wish to become pregnant or think they might be pregnant. There is a potential for serious side effects to an unborn child. Talk to your health care professional or pharmacist for more information. Do not breast-feed aninfant while taking this medicine. What side effects may I notice from receiving this medication? Side effects that you should report to your doctor or health care professionalas soon as possible: allergic reactions like skin rash, itching or hives, swelling of the face, lips, or tongue signs of infection - fever or chills, cough, sore throat, pain or difficulty passing urine signs of decreased platelets or bleeding - bruising, pinpoint red spots on the skin, black, tarry stools, nosebleeds signs of decreased red blood cells - unusually weak or tired, fainting spells, lightheadedness breathing problems changes in hearing changes in vision chest pain high blood pressure  low blood counts - This drug may decrease the number of white blood cells, red blood cells and platelets. You may be at increased risk for infections and bleeding. nausea and vomiting pain, swelling, redness or irritation at the injection site pain, tingling, numbness in the hands or feet problems with balance, talking, walking trouble passing urine or change in the amount of urine Side effects that usually do not require medical attention (report to yourdoctor or health care professional if they continue or are bothersome): hair loss loss of appetite metallic taste in the mouth or changes in taste This list may not describe all possible side effects. Call your doctor for medical advice about side effects. You may report side effects to FDA at1-800-FDA-1088. Where should I keep my medication? This drug is given in a hospital or clinic and will not be stored at home. NOTE: This sheet is a summary. It may not cover all possible  information. If you have questions about this medicine, talk to your doctor, pharmacist, orhealth care provider.  2022 Elsevier/Gold Standard (2008-03-02 14:38:05)

## 2021-06-21 NOTE — Progress Notes (Signed)
The following biosimilar Mvasi (bevacizumab-awwb) has been selected for use in this patient due insurance coverage.   Henreitta Leber, PharmD

## 2021-06-22 ENCOUNTER — Telehealth: Payer: Self-pay | Admitting: *Deleted

## 2021-06-23 ENCOUNTER — Telehealth: Payer: Self-pay | Admitting: Medical Oncology

## 2021-06-23 NOTE — Telephone Encounter (Signed)
  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  Please call her for any needs for care coordination.  Contact information under Special[facility] comments

## 2021-06-28 ENCOUNTER — Inpatient Hospital Stay: Payer: Commercial Managed Care - PPO | Admitting: Internal Medicine

## 2021-06-28 ENCOUNTER — Encounter: Payer: Self-pay | Admitting: Internal Medicine

## 2021-06-28 ENCOUNTER — Inpatient Hospital Stay: Payer: Commercial Managed Care - PPO

## 2021-06-28 ENCOUNTER — Other Ambulatory Visit: Payer: Self-pay

## 2021-06-28 VITALS — BP 132/86 | HR 84 | Temp 97.7°F | Resp 19 | Ht 61.0 in | Wt 111.8 lb

## 2021-06-28 DIAGNOSIS — Z5111 Encounter for antineoplastic chemotherapy: Secondary | ICD-10-CM

## 2021-06-28 DIAGNOSIS — C3491 Malignant neoplasm of unspecified part of right bronchus or lung: Secondary | ICD-10-CM

## 2021-06-28 DIAGNOSIS — Z5112 Encounter for antineoplastic immunotherapy: Secondary | ICD-10-CM | POA: Diagnosis not present

## 2021-06-28 LAB — CBC WITH DIFFERENTIAL (CANCER CENTER ONLY)
Abs Immature Granulocytes: 0.01 10*3/uL (ref 0.00–0.07)
Basophils Absolute: 0 10*3/uL (ref 0.0–0.1)
Basophils Relative: 1 %
Eosinophils Absolute: 0.2 10*3/uL (ref 0.0–0.5)
Eosinophils Relative: 3 %
HCT: 42.2 % (ref 36.0–46.0)
Hemoglobin: 14.3 g/dL (ref 12.0–15.0)
Immature Granulocytes: 0 %
Lymphocytes Relative: 47 %
Lymphs Abs: 2.9 10*3/uL (ref 0.7–4.0)
MCH: 31.1 pg (ref 26.0–34.0)
MCHC: 33.9 g/dL (ref 30.0–36.0)
MCV: 91.7 fL (ref 80.0–100.0)
Monocytes Absolute: 0.3 10*3/uL (ref 0.1–1.0)
Monocytes Relative: 5 %
Neutro Abs: 2.7 10*3/uL (ref 1.7–7.7)
Neutrophils Relative %: 44 %
Platelet Count: 185 10*3/uL (ref 150–400)
RBC: 4.6 MIL/uL (ref 3.87–5.11)
RDW: 11.9 % (ref 11.5–15.5)
WBC Count: 6.1 10*3/uL (ref 4.0–10.5)
nRBC: 0 % (ref 0.0–0.2)

## 2021-06-28 LAB — CMP (CANCER CENTER ONLY)
ALT: 30 U/L (ref 0–44)
AST: 24 U/L (ref 15–41)
Albumin: 4.3 g/dL (ref 3.5–5.0)
Alkaline Phosphatase: 76 U/L (ref 38–126)
Anion gap: 13 (ref 5–15)
BUN: 10 mg/dL (ref 6–20)
CO2: 26 mmol/L (ref 22–32)
Calcium: 9.9 mg/dL (ref 8.9–10.3)
Chloride: 99 mmol/L (ref 98–111)
Creatinine: 0.56 mg/dL (ref 0.44–1.00)
GFR, Estimated: >60 mL/min (ref >60)
Glucose, Bld: 77 mg/dL (ref 70–99)
Potassium: 4.4 mmol/L (ref 3.5–5.1)
Sodium: 138 mmol/L (ref 135–145)
Total Bilirubin: 0.5 mg/dL (ref 0.3–1.2)
Total Protein: 7.7 g/dL (ref 6.5–8.1)

## 2021-06-28 NOTE — Progress Notes (Signed)
[place] [phone]   [phone]  OFFICE PROGRESS NOTE  Kirsten Bender, PA-C [place] [address]  DIAGNOSIS:  Stage IV (T4, N2, M1 a) non-small cell lung cancer, adenocarcinoma presented with multifocal disease involving the right upper lobe, right lower lobe as well as suspicious lower paratracheal lymphadenopathy and groundglass nodules in the left lung diagnosed in May 2022. The patient had molecular studies performed by [facility] [employer] and that showed no detectable mutation but this is likely secondary to low-level circulating free tumor DNA.  PRIOR THERAPY: None.  CURRENT THERAPY: palliative systemic chemotherapy with carboplatin for AUC of 5, Alimta 500 Mg/M2 and Avastin 15 Mg/KG every 3 weeks.  The patient is not a great candidate for immunotherapy because of her history of active systemic lupus erythematosus.  She is status post 1 cycle.  INTERVAL HISTORY: Kirsten Williams 50 y.o. female returns to the clinic today for follow-up visit.  The patient is feeling fine today with no concerning complaints.  She tolerated the first week of her treatment fairly well except for 1 episode of diarrhea and Nausea.  She lost few pounds since her last visit.  She denied having any chest pain, shortness of breath, cough or hemoptysis.  She denied having any fever or chills.  She has no nausea, vomiting, diarrhea or constipation.  She has no headache or visual changes. She is here today for evaluation and repeat blood work.  MEDICAL HISTORY: Past Medical History:  Diagnosis Date   Adenocarcinoma, lung, right (Kirsten Williams) 05/03/2021   Kirsten Williams is a 50 y.o. female with a history of lung nodules dating back to 2019 who is referred in consultation with Kirsten Bender, PA-C for assessment and management. She is a former smoker having quit in 2016. 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 She missed her screening CT last  year and imaging was ordered last month. CT chest from 02-16-2021 reveals pro   Anxiety    GERD (gastroesophageal reflux disease)    SLE (systemic lupus erythematosus) ([place]) 05/03/2021   SLE (systemic lupus erythematosus) ([place]) 05/03/2021    ALLERGIES:  has No Known Allergies.  MEDICATIONS:  Current Outpatient Medications  Medication Sig Dispense Refill   acetaminophen (TYLENOL) 325 MG tablet Take 975 mg by mouth every 6 (six) hours as needed for moderate pain or headache.     dexamethasone (DECADRON) 4 MG tablet 4 mg p.o. twice daily the day before, day of and day after the chemotherapy every 3 weeks. 40 tablet 1   folic acid (FOLVITE) 1 MG tablet Take 1 tablet (1 mg total) by mouth daily. 30 tablet 4   hydroxychloroquine (PLAQUENIL) 200 MG tablet Take 200 mg by mouth 2 (two) times daily.     Multiple Vitamins-Minerals (MULTI ADULT GUMMIES) CHEW Chew 2 tablets by mouth daily.     omeprazole (PRILOSEC) 40 MG capsule Take 40 mg by mouth daily as needed (acid reflux).     prochlorperazine (COMPAZINE) 10 MG tablet Take 1 tablet (10 mg total) by mouth every 6 (six) hours as needed. (Patient not taking: Reported on 06/28/2021) 30 tablet 2   cyclobenzaprine (FLEXERIL) 10 MG tablet Take 10 mg by mouth at bedtime as needed for muscle spasms. (Patient not taking: Reported on 06/28/2021)     diphenhydrAMINE (BENADRYL) 25 MG tablet Take 25 mg by mouth daily as needed for allergies. (Patient not taking: Reported on 06/28/2021)  HYDROcodone-acetaminophen (NORCO/VICODIN) 5-325 MG tablet Take 1 tablet by mouth every 6 (six) hours as needed for moderate pain. (Patient not taking: Reported on 06/28/2021) 30 tablet 0   ondansetron (ZOFRAN) 4 MG tablet Take 8 mg by mouth 2 (two) times daily as needed for vomiting or nausea. (Patient not taking: Reported on 06/28/2021)     No current facility-administered medications for this visit.    SURGICAL HISTORY:  Past Surgical History:  Procedure Laterality Date    ABDOMINAL HYSTERECTOMY     BRONCHIAL BIOPSY  04/24/2021   Procedure: BRONCHIAL BIOPSIES;  Surgeon: Collene Gobble, MD;  Location: [facility] ENDOSCOPY;  Service: Pulmonary;;   BRONCHIAL BRUSHINGS  04/24/2021   Procedure: BRONCHIAL BRUSHINGS;  Surgeon: Collene Gobble, MD;  Location: [facility] ENDOSCOPY;  Service: Pulmonary;;   BRONCHIAL NEEDLE ASPIRATION BIOPSY  04/24/2021   Procedure: BRONCHIAL NEEDLE ASPIRATION BIOPSIES;  Surgeon: Collene Gobble, MD;  Location: [place];  Service: Pulmonary;;   BRONCHIAL WASHINGS  04/24/2021   Procedure: BRONCHIAL WASHINGS;  Surgeon: Collene Gobble, MD;  Location: [place];  Service: Pulmonary;;   CESAREAN SECTION  11/09/1990   DILATION AND CURETTAGE OF UTERUS Bilateral 09/09/1996   FIDUCIAL MARKER PLACEMENT  04/24/2021   Procedure: FIDUCIAL MARKER PLACEMENT;  Surgeon: Collene Gobble, MD;  Location: [facility] ENDOSCOPY;  Service: Pulmonary;;   TONSILLECTOMY  12/10/1977   VIDEO BRONCHOSCOPY WITH ENDOBRONCHIAL NAVIGATION Bilateral 04/24/2021   Procedure: VIDEO BRONCHOSCOPY WITH ENDOBRONCHIAL NAVIGATION;  Surgeon: Collene Gobble, MD;  Location: [facility] ENDOSCOPY;  Service: Pulmonary;  Laterality: Bilateral;    REVIEW OF SYSTEMS:  A comprehensive review of systems was negative except for: Constitutional: positive for fatigue Gastrointestinal: positive for nausea   PHYSICAL EXAMINATION: General appearance: alert, cooperative, and no distress Head: Normocephalic, without obvious abnormality, atraumatic Neck: no adenopathy, no JVD, supple, symmetrical, trachea midline, and thyroid not enlarged, symmetric, no tenderness/mass/nodules Lymph nodes: Cervical, supraclavicular, and axillary nodes normal. Resp: clear to auscultation bilaterally Back: symmetric, no curvature. ROM normal. No CVA tenderness. Cardio: regular rate and rhythm, S1, S2 normal, no murmur, click, rub or gallop GI: soft, non-tender; bowel sounds normal; no masses,  no organomegaly Extremities: extremities normal,  atraumatic, no cyanosis or edema  ECOG PERFORMANCE STATUS: 1 - Symptomatic but completely ambulatory  Blood pressure 132/86, pulse 84, temperature 97.7 F (36.5 C), temperature source Tympanic, resp. rate 19, height 5\' 1"  (1.549 m), weight 111 lb 12.8 oz (50.7 kg), SpO2 100 %.  LABORATORY DATA: Lab Results  Component Value Date   WBC 6.1 06/28/2021   HGB 14.3 06/28/2021   HCT 42.2 06/28/2021   MCV 91.7 06/28/2021   PLT 185 06/28/2021      Chemistry      Component Value Date/Time   NA 139 06/21/2021 1231   NA 140 03/13/2021 0000   K 3.7 06/21/2021 1231   CL 104 06/21/2021 1231   CO2 24 06/21/2021 1231   BUN 12 06/21/2021 1231   BUN 10 03/13/2021 0000   CREATININE 0.72 06/21/2021 1231   GLU 107 03/13/2021 0000      Component Value Date/Time   CALCIUM 10.3 06/21/2021 1231   ALKPHOS 92 06/21/2021 1231   AST 15 06/21/2021 1231   ALT 19 06/21/2021 1231   BILITOT 0.2 (L) 06/21/2021 1231       RADIOGRAPHIC STUDIES: No results found.  ASSESSMENT AND PLAN:  This is a very pleasant 50 years old white female recently diagnosed with a stage IV non-small cell lung cancer, adenocarcinoma with  no actionable mutation diagnosed in May 2022.  The patient has also history of systemic lupus erythematosus and she is not a candidate for immunotherapy. She is currently undergoing systemic chemotherapy with carboplatin for AUC of 5, Alimta 500 Mg/M2 and Avastin 15 Mg/KG every 3 weeks status post 1 cycle started last week. The patient tolerated the first week of her treatment well with no significant adverse effects. I recommended for her to proceed with cycle #2 in 2 weeks as planned. She will come back for follow-up visit at that time. She was advised to call immediately if she has any concerning symptoms in the interval. The patient voices understanding of current disease status and treatment options and is in agreement with the current care plan.  All questions were answered. The  patient knows to call the clinic with any problems, questions or concerns. We can certainly see the patient much sooner if necessary.  The total time spent in the appointment was 20 minutes.  Disclaimer: This note was dictated with voice recognition software. Similar sounding words can inadvertently be transcribed and may not be corrected upon review.

## 2021-07-03 ENCOUNTER — Telehealth: Payer: Self-pay | Admitting: *Deleted

## 2021-07-03 NOTE — Telephone Encounter (Signed)
Received call from pt reporting that she has a bad tooth & it has been bleeding & an oral surgeon gave her an ATB but she was unsure whether to take it & she thinks she needs to have the tooth out. Informed that she should not have tooth pulled since she received Bevacizumab. Suggested she have her dentist look at & discuss with Dr Julien Nordmann if something needs to be done.  Message routed to Dr Mohamed/Pod.

## 2021-07-03 NOTE — Telephone Encounter (Signed)
I have LVM for pt advising as indicated.

## 2021-07-05 ENCOUNTER — Other Ambulatory Visit: Payer: Self-pay

## 2021-07-05 ENCOUNTER — Inpatient Hospital Stay: Payer: Commercial Managed Care - PPO

## 2021-07-05 DIAGNOSIS — C3491 Malignant neoplasm of unspecified part of right bronchus or lung: Secondary | ICD-10-CM

## 2021-07-05 DIAGNOSIS — Z5112 Encounter for antineoplastic immunotherapy: Secondary | ICD-10-CM | POA: Diagnosis not present

## 2021-07-05 LAB — CMP (CANCER CENTER ONLY)
ALT: 42 U/L (ref 0–44)
AST: 31 U/L (ref 15–41)
Albumin: 3.8 g/dL (ref 3.5–5.0)
Alkaline Phosphatase: 79 U/L (ref 38–126)
Anion gap: 8 (ref 5–15)
BUN: 9 mg/dL (ref 6–20)
CO2: 27 mmol/L (ref 22–32)
Calcium: 9.7 mg/dL (ref 8.9–10.3)
Chloride: 104 mmol/L (ref 98–111)
Creatinine: 0.73 mg/dL (ref 0.44–1.00)
GFR, Estimated: >60 mL/min (ref >60)
Glucose, Bld: 137 mg/dL — ABNORMAL HIGH (ref 70–99)
Potassium: 3.6 mmol/L (ref 3.5–5.1)
Sodium: 139 mmol/L (ref 135–145)
Total Bilirubin: <0.2 mg/dL — ABNORMAL LOW (ref 0.3–1.2)
Total Protein: 7 g/dL (ref 6.5–8.1)

## 2021-07-05 LAB — CBC WITH DIFFERENTIAL (CANCER CENTER ONLY)
Abs Immature Granulocytes: 0.01 10*3/uL (ref 0.00–0.07)
Basophils Absolute: 0 10*3/uL (ref 0.0–0.1)
Basophils Relative: 0 %
Eosinophils Absolute: 0.1 10*3/uL (ref 0.0–0.5)
Eosinophils Relative: 3 %
HCT: 34.8 % — ABNORMAL LOW (ref 36.0–46.0)
Hemoglobin: 11.9 g/dL — ABNORMAL LOW (ref 12.0–15.0)
Immature Granulocytes: 0 %
Lymphocytes Relative: 50 %
Lymphs Abs: 2.5 10*3/uL (ref 0.7–4.0)
MCH: 31.2 pg (ref 26.0–34.0)
MCHC: 34.2 g/dL (ref 30.0–36.0)
MCV: 91.1 fL (ref 80.0–100.0)
Monocytes Absolute: 0.4 10*3/uL (ref 0.1–1.0)
Monocytes Relative: 8 %
Neutro Abs: 2 10*3/uL (ref 1.7–7.7)
Neutrophils Relative %: 39 %
Platelet Count: 137 10*3/uL — ABNORMAL LOW (ref 150–400)
RBC: 3.82 MIL/uL — ABNORMAL LOW (ref 3.87–5.11)
RDW: 12.3 % (ref 11.5–15.5)
WBC Count: 5.1 10*3/uL (ref 4.0–10.5)
nRBC: 0 % (ref 0.0–0.2)

## 2021-07-07 NOTE — Progress Notes (Signed)
[place] OFFICE PROGRESS NOTE  Cyndi Bender, PA-C [place] [address]  DIAGNOSIS:  Stage IV (T4, N2, M1 a) non-small cell lung cancer, adenocarcinoma presented with multifocal disease involving the right upper lobe, right lower lobe as well as suspicious lower paratracheal lymphadenopathy and groundglass nodules in the left lung diagnosed in May 2022. The patient had molecular studies performed by [facility] [employer] and that showed no detectable mutation but this is likely secondary to low-level circulating free tumor DNA.  PRIOR THERAPY: None  CURRENT THERAPY: palliative systemic chemotherapy with carboplatin for AUC of 5, Alimta 500 Mg/M2 and Avastin 15 Mg/KG every 3 weeks.  The patient is not a great candidate for immunotherapy because of her history of active systemic lupus erythematosus.  She is status post 1 cycle.   INTERVAL HISTORY: Kirsten Williams 50 y.o. female returns to the clinic today for a follow-up visit. The patient was recently diagnosed with lung cancer.  She is currently undergoing systemic chemotherapy and she is status post 1 cycle.  She tolerated her first cycle fairly well except for one episode of nausea and vomiting. She noticed some increased constipation and she is going to try to start taking colace. She had some fatigue and some feelings of being hot which is likely due to the steroids. She is planning on having a dental extraction next week on 07/21/21. She is supposed to take amoxicillin a few days before her procedure. She has been having some dental concerns prior to her recent diagnosis. She was supposed to get dental implants but will just proceed with extractions at this time. She reports some intermittent dental pain. She had some dental bleeding a few weeks ago but none in the last few days.   She denies any recent fever, chills, or night sweats.  Her appetite comes and goes. Her weight is stable since her last appointment. She  sometimes has fleeting migratory sharp rib pain without associated shortness of breath, N/V, exertion, or lightheadedness. She denies any chest pain, shortness of breath,  or hemoptysis. She has a mild cough which sometimes has some loose phlegm. She thinks she is coughing a little more than before. She denies any recent nausea, vomiting, diarrhea, or constipation.  She denies any abnormal bleeding or bruising.  She denies any headaches.  She is here today for evaluation and repeat blood work before starting cycle #2.     MEDICAL HISTORY: Past Medical History:  Diagnosis Date   Adenocarcinoma, lung, right ([place]) 05/03/2021   Kirsten Williams is a 50 y.o. female with a history of lung nodules dating back to 2019 who is referred in consultation with Cyndi Bender, PA-C for assessment and management. She is a former smoker having quit in 2016. 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 She missed her screening CT last year and imaging was ordered last month. CT chest from 02-16-2021 reveals pro   Anxiety    GERD (gastroesophageal reflux disease)    SLE (systemic lupus erythematosus) ([place]) 05/03/2021   SLE (systemic lupus erythematosus) ([place]) 05/03/2021    ALLERGIES:  has No Known Allergies.  MEDICATIONS:  Current Outpatient Medications  Medication Sig Dispense Refill   acetaminophen (TYLENOL) 325 MG tablet Take 975 mg by mouth every 6 (six) hours as needed for moderate pain or headache.     dexamethasone (DECADRON) 4 MG tablet 4 mg p.o. twice daily the day before, day of and day after the  chemotherapy every 3 weeks. 40 tablet 1   folic acid (FOLVITE) 1 MG tablet Take 1 tablet (1 mg total) by mouth daily. 30 tablet 4   hydroxychloroquine (PLAQUENIL) 200 MG tablet Take 200 mg by mouth 2 (two) times daily.     Multiple Vitamins-Minerals (MULTI ADULT GUMMIES) CHEW Chew 2 tablets by mouth daily.     omeprazole (PRILOSEC) 40 MG capsule Take 40 mg by mouth daily as needed (acid  reflux).     prochlorperazine (COMPAZINE) 10 MG tablet Take 1 tablet (10 mg total) by mouth every 6 (six) hours as needed. (Patient not taking: No sig reported) 30 tablet 2   triamcinolone ointment (KENALOG) 0.5 % Apply topically 3 (three) times daily.     cyclobenzaprine (FLEXERIL) 10 MG tablet Take 10 mg by mouth at bedtime as needed for muscle spasms. (Patient not taking: No sig reported)     diphenhydrAMINE (BENADRYL) 25 MG tablet Take 25 mg by mouth daily as needed for allergies. (Patient not taking: No sig reported)     HYDROcodone-acetaminophen (NORCO/VICODIN) 5-325 MG tablet Take 1 tablet by mouth every 6 (six) hours as needed for moderate pain. (Patient not taking: No sig reported) 30 tablet 0   ondansetron (ZOFRAN) 4 MG tablet Take 8 mg by mouth 2 (two) times daily as needed for vomiting or nausea. (Patient not taking: No sig reported)     No current facility-administered medications for this visit.    SURGICAL HISTORY:  Past Surgical History:  Procedure Laterality Date   ABDOMINAL HYSTERECTOMY     BRONCHIAL BIOPSY  04/24/2021   Procedure: BRONCHIAL BIOPSIES;  Surgeon: Collene Gobble, MD;  Location: [facility] ENDOSCOPY;  Service: Pulmonary;;   BRONCHIAL BRUSHINGS  04/24/2021   Procedure: BRONCHIAL BRUSHINGS;  Surgeon: Collene Gobble, MD;  Location: [facility] ENDOSCOPY;  Service: Pulmonary;;   BRONCHIAL NEEDLE ASPIRATION BIOPSY  04/24/2021   Procedure: BRONCHIAL NEEDLE ASPIRATION BIOPSIES;  Surgeon: Collene Gobble, MD;  Location: [place];  Service: Pulmonary;;   BRONCHIAL WASHINGS  04/24/2021   Procedure: BRONCHIAL WASHINGS;  Surgeon: Collene Gobble, MD;  Location: [place];  Service: Pulmonary;;   CESAREAN SECTION  11/09/1990   DILATION AND CURETTAGE OF UTERUS Bilateral 09/09/1996   FIDUCIAL MARKER PLACEMENT  04/24/2021   Procedure: FIDUCIAL MARKER PLACEMENT;  Surgeon: Collene Gobble, MD;  Location: [facility] ENDOSCOPY;  Service: Pulmonary;;   TONSILLECTOMY  12/10/1977   VIDEO BRONCHOSCOPY  WITH ENDOBRONCHIAL NAVIGATION Bilateral 04/24/2021   Procedure: VIDEO BRONCHOSCOPY WITH ENDOBRONCHIAL NAVIGATION;  Surgeon: Collene Gobble, MD;  Location: [facility] ENDOSCOPY;  Service: Pulmonary;  Laterality: Bilateral;    REVIEW OF SYSTEMS:   Review of Systems  Constitutional: Positive for fatigue and appetite change.  Negative for chills, fever and unexpected weight change.  HENT: Positive for dental pain.  Negative for mouth sores, nosebleeds, sore throat and trouble swallowing.   Eyes: Negative for eye problems and icterus.  Respiratory: Positive for mild cough.  Negative for cough, hemoptysis, shortness of breath and wheezing.   Cardiovascular: Negative for chest pain and leg swelling.  Gastrointestinal: Positive for constipation.  Negative for abdominal pain, diarrhea, nausea and vomiting.  Genitourinary: Negative for bladder incontinence, difficulty urinating, dysuria, frequency and hematuria.   Musculoskeletal: Negative for back pain, gait problem, neck pain and neck stiffness.  Skin: Negative for itching and rash.  Neurological: Negative for dizziness, extremity weakness, gait problem, headaches, light-headedness and seizures.  Hematological: Negative for adenopathy. Does not bruise/bleed easily.  Psychiatric/Behavioral: Negative for confusion,  depression and sleep disturbance. The patient is not nervous/anxious.     PHYSICAL EXAMINATION:  Blood pressure 128/78, pulse 86, temperature (!) 97.3 F (36.3 C), temperature source Tympanic, resp. rate 18, height 5\' 1"  (1.549 m), weight 112 lb 9.6 oz (51.1 kg), SpO2 100 %.  ECOG PERFORMANCE STATUS: 1  Physical Exam  Constitutional: Oriented to person, place, and time and well-developed, well-nourished, and in no distress. HENT:  Head: Normocephalic and atraumatic.  Mouth/Throat: Oropharynx is clear and moist. No oropharyngeal exudate.  Eyes: Conjunctivae are normal. Right eye exhibits no discharge. Left eye exhibits no discharge. No scleral  icterus.  Neck: Normal range of motion. Neck supple.  Cardiovascular: Normal rate, regular rhythm, normal heart sounds and intact distal pulses.   Pulmonary/Chest: Effort normal and breath sounds normal. No respiratory distress. No wheezes. No rales.  Abdominal: Soft. Bowel sounds are normal. Exhibits no distension and no mass. There is no tenderness.  Musculoskeletal: Normal range of motion. Exhibits no edema.  Lymphadenopathy:    No cervical adenopathy.  Neurological: Alert and oriented to person, place, and time. Exhibits normal muscle tone. Gait normal. Coordination normal.  Skin: Skin is warm and dry. No rash noted. Not diaphoretic. No erythema. No pallor.  Psychiatric: Mood, memory and judgment normal.  Vitals reviewed.  LABORATORY DATA: Lab Results  Component Value Date   WBC 6.6 07/12/2021   HGB 12.0 07/12/2021   HCT 35.1 (L) 07/12/2021   MCV 91.2 07/12/2021   PLT 345 07/12/2021      Chemistry      Component Value Date/Time   NA 138 07/12/2021 0928   NA 140 03/13/2021 0000   K 3.8 07/12/2021 0928   CL 104 07/12/2021 0928   CO2 23 07/12/2021 0928   BUN 13 07/12/2021 0928   BUN 10 03/13/2021 0000   CREATININE 0.75 07/12/2021 0928   GLU 107 03/13/2021 0000      Component Value Date/Time   CALCIUM 9.9 07/12/2021 0928   ALKPHOS 81 07/12/2021 0928   AST 19 07/12/2021 0928   ALT 25 07/12/2021 0928   BILITOT 0.2 (L) 07/12/2021 0928       RADIOGRAPHIC STUDIES:  No results found.   ASSESSMENT/PLAN:  This is a very pleasant 50 year old Caucasian female recently diagnosed with stage IV (T4, N2, M1 a) non-small cell lung cancer, adenocarcinoma presented with multifocal disease involving the right upper lobe, right lower lobe as well as suspicious lower paratracheal lymphadenopathy and groundglass nodules in the left lung diagnosed in May 2022. The patient had molecular studies performed by [facility] [employer] and that showed no detectable mutation but this is likely secondary  to low-level circulating free tumor DNA.   Since the patient has a history of systemic lupus erythematosus, she is not a candidate for immunotherapy.   The patient is currently undergoing systemic chemotherapy with carboplatin for an AUC 5, Alimta 500 mg per metered square, and and Avastin 1500 mg/kg IV every 3 weeks.  She is status post 1 cycle.   Labs reviewed.  Recommend that she proceed cycle #2 today scheduled. She is getting her dental extraction on 07/21/21 since she gets Avastin which can delay wound healing. Dr. Julien Nordmann is ok to proceed with Avastin today with her dental procedure being on 8/12.   We will see her back for follow-up visit in 3 weeks for evaluation before starting cycle #3.  The patient was advised to call immediately if she has any concerning symptoms in the interval. The patient voices understanding  of current disease status and treatment options and is in agreement with the current care plan. All questions were answered. The patient knows to call the clinic with any problems, questions or concerns. We can certainly see the patient much sooner if necessary      No orders of the defined types were placed in this encounter.     The total time spent in the appointment was 20-29 minutes  Kirsten Deitrick L Sayaka Hoeppner, PA-C 07/12/21

## 2021-07-12 ENCOUNTER — Inpatient Hospital Stay (HOSPITAL_BASED_OUTPATIENT_CLINIC_OR_DEPARTMENT_OTHER): Payer: Commercial Managed Care - PPO | Admitting: Physician Assistant

## 2021-07-12 ENCOUNTER — Inpatient Hospital Stay: Payer: Commercial Managed Care - PPO | Attending: Hematology and Oncology

## 2021-07-12 ENCOUNTER — Encounter: Payer: Self-pay | Admitting: Physician Assistant

## 2021-07-12 ENCOUNTER — Inpatient Hospital Stay: Payer: Commercial Managed Care - PPO

## 2021-07-12 ENCOUNTER — Other Ambulatory Visit: Payer: Self-pay

## 2021-07-12 VITALS — BP 114/67 | HR 83

## 2021-07-12 VITALS — BP 128/78 | HR 86 | Temp 97.3°F | Resp 18 | Ht 61.0 in | Wt 112.6 lb

## 2021-07-12 DIAGNOSIS — Z5112 Encounter for antineoplastic immunotherapy: Secondary | ICD-10-CM | POA: Diagnosis present

## 2021-07-12 DIAGNOSIS — C3411 Malignant neoplasm of upper lobe, right bronchus or lung: Secondary | ICD-10-CM | POA: Diagnosis present

## 2021-07-12 DIAGNOSIS — M329 Systemic lupus erythematosus, unspecified: Secondary | ICD-10-CM | POA: Insufficient documentation

## 2021-07-12 DIAGNOSIS — C3491 Malignant neoplasm of unspecified part of right bronchus or lung: Secondary | ICD-10-CM

## 2021-07-12 DIAGNOSIS — Z5111 Encounter for antineoplastic chemotherapy: Secondary | ICD-10-CM | POA: Insufficient documentation

## 2021-07-12 LAB — CMP (CANCER CENTER ONLY)
ALT: 25 U/L (ref 0–44)
AST: 19 U/L (ref 15–41)
Albumin: 4.1 g/dL (ref 3.5–5.0)
Alkaline Phosphatase: 81 U/L (ref 38–126)
Anion gap: 11 (ref 5–15)
BUN: 13 mg/dL (ref 6–20)
CO2: 23 mmol/L (ref 22–32)
Calcium: 9.9 mg/dL (ref 8.9–10.3)
Chloride: 104 mmol/L (ref 98–111)
Creatinine: 0.75 mg/dL (ref 0.44–1.00)
GFR, Estimated: >60 mL/min (ref >60)
Glucose, Bld: 151 mg/dL — ABNORMAL HIGH (ref 70–99)
Potassium: 3.8 mmol/L (ref 3.5–5.1)
Sodium: 138 mmol/L (ref 135–145)
Total Bilirubin: 0.2 mg/dL — ABNORMAL LOW (ref 0.3–1.2)
Total Protein: 7.2 g/dL (ref 6.5–8.1)

## 2021-07-12 LAB — CBC WITH DIFFERENTIAL (CANCER CENTER ONLY)
Abs Immature Granulocytes: 0.02 10*3/uL (ref 0.00–0.07)
Basophils Absolute: 0 10*3/uL (ref 0.0–0.1)
Basophils Relative: 0 %
Eosinophils Absolute: 0 10*3/uL (ref 0.0–0.5)
Eosinophils Relative: 0 %
HCT: 35.1 % — ABNORMAL LOW (ref 36.0–46.0)
Hemoglobin: 12 g/dL (ref 12.0–15.0)
Immature Granulocytes: 0 %
Lymphocytes Relative: 14 %
Lymphs Abs: 0.9 10*3/uL (ref 0.7–4.0)
MCH: 31.2 pg (ref 26.0–34.0)
MCHC: 34.2 g/dL (ref 30.0–36.0)
MCV: 91.2 fL (ref 80.0–100.0)
Monocytes Absolute: 0.4 10*3/uL (ref 0.1–1.0)
Monocytes Relative: 6 %
Neutro Abs: 5.2 10*3/uL (ref 1.7–7.7)
Neutrophils Relative %: 80 %
Platelet Count: 345 10*3/uL (ref 150–400)
RBC: 3.85 MIL/uL — ABNORMAL LOW (ref 3.87–5.11)
RDW: 13.2 % (ref 11.5–15.5)
WBC Count: 6.6 10*3/uL (ref 4.0–10.5)
nRBC: 0 % (ref 0.0–0.2)

## 2021-07-12 LAB — TOTAL PROTEIN, URINE DIPSTICK: Protein, ur: NEGATIVE mg/dL

## 2021-07-12 MED ORDER — SODIUM CHLORIDE 0.9 % IV SOLN
Freq: Once | INTRAVENOUS | Status: AC
  Filled 2021-07-12: qty 250

## 2021-07-12 MED ORDER — PALONOSETRON HCL INJECTION 0.25 MG/5ML
0.2500 mg | Freq: Once | INTRAVENOUS | Status: AC
  Administered 2021-07-12: 0.25 mg via INTRAVENOUS

## 2021-07-12 MED ORDER — SODIUM CHLORIDE 0.9 % IV SOLN
500.0000 mg/m2 | Freq: Once | INTRAVENOUS | Status: AC
  Administered 2021-07-12: 750 mg via INTRAVENOUS
  Filled 2021-07-12: qty 12

## 2021-07-12 MED ORDER — SODIUM CHLORIDE 0.9 % IV SOLN
475.5000 mg | Freq: Once | INTRAVENOUS | Status: AC
  Administered 2021-07-12: 480 mg via INTRAVENOUS
  Filled 2021-07-12: qty 48

## 2021-07-12 MED ORDER — SODIUM CHLORIDE 0.9 % IV SOLN
15.0000 mg/kg | Freq: Once | INTRAVENOUS | Status: AC
  Administered 2021-07-12: 800 mg via INTRAVENOUS
  Filled 2021-07-12: qty 32

## 2021-07-12 MED ORDER — SODIUM CHLORIDE 0.9 % IV SOLN
10.0000 mg | Freq: Once | INTRAVENOUS | Status: AC
  Administered 2021-07-12: 10 mg via INTRAVENOUS
  Filled 2021-07-12: qty 10

## 2021-07-12 MED ORDER — PALONOSETRON HCL INJECTION 0.25 MG/5ML
INTRAVENOUS | Status: AC
  Filled 2021-07-12: qty 5

## 2021-07-12 MED ORDER — SODIUM CHLORIDE 0.9 % IV SOLN
150.0000 mg | Freq: Once | INTRAVENOUS | Status: AC
  Administered 2021-07-12: 150 mg via INTRAVENOUS
  Filled 2021-07-12: qty 150

## 2021-07-12 NOTE — Patient Instructions (Signed)
[facility]  Discharge Instructions: Thank you for choosing [facility] to provide your oncology and hematology care.   If you have a lab appointment with the [facility], please go directly to the [facility] and check in at the registration area.   Wear comfortable clothing and clothing appropriate for easy access to any Portacath or PICC line.   We strive to give you quality time with your provider. You may need to reschedule your appointment if you arrive late (15 or more minutes).  Arriving late affects you and other patients whose appointments are after yours.  Also, if you miss three or more appointments without notifying the office, you may be dismissed from the clinic at the provider's discretion.      For prescription refill requests, have your pharmacy contact our office and allow 72 hours for refills to be completed.    Today you received the following chemotherapy and/or immunotherapy agents Mvasi, Alimta & Carbo      To help prevent nausea and vomiting after your treatment, we encourage you to take your nausea medication as directed.  BELOW ARE SYMPTOMS THAT SHOULD BE REPORTED IMMEDIATELY: *FEVER GREATER THAN 100.4 F (38 C) OR HIGHER *CHILLS OR SWEATING *NAUSEA AND VOMITING THAT IS NOT CONTROLLED WITH YOUR NAUSEA MEDICATION *UNUSUAL SHORTNESS OF BREATH *UNUSUAL BRUISING OR BLEEDING *URINARY PROBLEMS (pain or burning when urinating, or frequent urination) *BOWEL PROBLEMS (unusual diarrhea, constipation, pain near the anus) TENDERNESS IN MOUTH AND THROAT WITH OR WITHOUT PRESENCE OF ULCERS (sore throat, sores in mouth, or a toothache) UNUSUAL RASH, SWELLING OR PAIN  UNUSUAL VAGINAL DISCHARGE OR ITCHING   Items with * indicate a potential emergency and should be followed up as soon as possible or go to the Emergency Department if any problems should occur.  Please show the CHEMOTHERAPY ALERT CARD or IMMUNOTHERAPY ALERT CARD at  check-in to the Emergency Department and triage nurse.  Should you have questions after your visit or need to cancel or reschedule your appointment, please contact [facility]  Dept: [phone]  and follow the prompts.  Office hours are 8:00 a.m. to 4:30 p.m. Monday - Friday. Please note that voicemails left after 4:00 p.m. may not be returned until the following business day.  We are closed weekends and major holidays. You have access to a nurse at all times for urgent questions. Please call the main number to the clinic Dept: [phone] and follow the prompts.   For any non-urgent questions, you may also contact your provider using MyChart. We now offer e-Visits for anyone 5 and older to request care online for non-urgent symptoms. For details visit [email or web address].   Also download the MyChart app! Go to the app store, search "MyChart", open the app, select [facility], and log in with your MyChart username and password.  Due to Covid, a mask is required upon entering the hospital/clinic. If you do not have a mask, one will be given to you upon arrival. For doctor visits, patients may have 1 support person aged 17 or older with them. For treatment visits, patients cannot have anyone with them due to current Covid guidelines and our immunocompromised population.   Bevacizumab injection What is this medication? BEVACIZUMAB (be va SIZ yoo mab) is a monoclonal antibody. It is used to treatmany types of cancer. This medicine may be used for other purposes; ask your health care provider orpharmacist if you have questions. COMMON BRAND NAME(S): Avastin,  MVASI, Noah Charon What should I tell my care team before I take this medication? They need to know if you have any of these conditions: diabetes heart disease high blood pressure history of coughing up blood prior anthracycline chemotherapy (e.g., doxorubicin, daunorubicin, epirubicin) recent or ongoing  radiation therapy recent or planning to have surgery stroke an unusual or allergic reaction to bevacizumab, hamster proteins, mouse proteins, other medicines, foods, dyes, or preservatives pregnant or trying to get pregnant breast-feeding How should I use this medication? This medicine is for infusion into a vein. It is given by a health careprofessional in a hospital or clinic setting. Talk to your pediatrician regarding the use of this medicine in children.Special care may be needed. Overdosage: If you think you have taken too much of this medicine contact apoison control center or emergency room at once. NOTE: This medicine is only for you. Do not share this medicine with others. What if I miss a dose? It is important not to miss your dose. Call your doctor or health careprofessional if you are unable to keep an appointment. What may interact with this medication? Interactions are not expected. This list may not describe all possible interactions. Give your health care provider a list of all the medicines, herbs, non-prescription drugs, or dietary supplements you use. Also tell them if you smoke, drink alcohol, or use illegaldrugs. Some items may interact with your medicine. What should I watch for while using this medication? Your condition will be monitored carefully while you are receiving this medicine. You will need important blood work and urine testing done while youare taking this medicine. This medicine may increase your risk to bruise or bleed. Call your doctor orhealth care professional if you notice any unusual bleeding. Before having surgery, talk to your health care provider to make sure it is ok. This drug can increase the risk of poor healing of your surgical site or wound. You will need to stop this drug for 28 days before surgery. After surgery, wait at least 28 days before restarting this drug. Make sure the surgical site or wound is healed enough before restarting this drug.  Talk to your health careprovider if questions. Do not become pregnant while taking this medicine or for 6 months after stopping it. Women should inform their doctor if they wish to become pregnant or think they might be pregnant. There is a potential for serious side effects to an unborn child. Talk to your health care professional or pharmacist for more information. Do not breast-feed an infant while taking this medicine andfor 6 months after the last dose. This medicine has caused ovarian failure in some women. This medicine may interfere with the ability to have a child. You should talk to your doctor orhealth care professional if you are concerned about your fertility. What side effects may I notice from receiving this medication? Side effects that you should report to your doctor or health care professionalas soon as possible: allergic reactions like skin rash, itching or hives, swelling of the face, lips, or tongue chest pain or chest tightness chills coughing up blood high fever seizures severe constipation signs and symptoms of bleeding such as bloody or black, tarry stools; red or dark-Rhody urine; spitting up blood or [place] material that looks like coffee grounds; red spots on the skin; unusual bruising or bleeding from the eye, gums, or nose signs and symptoms of a blood clot such as breathing problems; chest pain; severe, sudden headache; pain, swelling, warmth in the leg  signs and symptoms of a stroke like changes in vision; confusion; trouble speaking or understanding; severe headaches; sudden numbness or weakness of the face, arm or leg; trouble walking; dizziness; loss of balance or coordination stomach pain sweating swelling of legs or ankles vomiting weight gain Side effects that usually do not require medical attention (report to yourdoctor or health care professional if they continue or are bothersome): back pain changes in taste decreased appetite dry  skin nausea tiredness This list may not describe all possible side effects. Call your doctor for medical advice about side effects. You may report side effects to FDA at1-800-FDA-1088. Where should I keep my medication? This drug is given in a hospital or clinic and will not be stored at home. NOTE: This sheet is a summary. It may not cover all possible information. If you have questions about this medicine, talk to your doctor, pharmacist, orhealth care provider.  2022 Elsevier/Gold Standard (2019-09-23 10:50:46)  Pemetrexed injection What is this medication? PEMETREXED (PEM e TREX ed) is a chemotherapy drug used to treat lung cancers like non-small cell lung cancer and mesothelioma. It may also be used to treatother cancers. This medicine may be used for other purposes; ask your health care provider orpharmacist if you have questions. COMMON BRAND NAME(S): Alimta What should I tell my care team before I take this medication? They need to know if you have any of these conditions: infection (especially a virus infection such as chickenpox, cold sores, or herpes) kidney disease low blood counts, like low white cell, platelet, or red cell counts lung or breathing disease, like asthma radiation therapy an unusual or allergic reaction to pemetrexed, other medicines, foods, dyes, or preservative pregnant or trying to get pregnant breast-feeding How should I use this medication? This drug is given as an infusion into a vein. It is administered in a hospitalor clinic by a specially trained health care professional. Talk to your pediatrician regarding the use of this medicine in children.Special care may be needed. Overdosage: If you think you have taken too much of this medicine contact apoison control center or emergency room at once. NOTE: This medicine is only for you. Do not share this medicine with others. What if I miss a dose? It is important not to miss your dose. Call your doctor or  health careprofessional if you are unable to keep an appointment. What may interact with this medication? This medicine may interact with the following medications: Ibuprofen This list may not describe all possible interactions. Give your health care provider a list of all the medicines, herbs, non-prescription drugs, or dietary supplements you use. Also tell them if you smoke, drink alcohol, or use illegaldrugs. Some items may interact with your medicine. What should I watch for while using this medication? Visit your doctor for checks on your progress. This drug may make you feel generally unwell. This is not uncommon, as chemotherapy can affect healthy cells as well as cancer cells. Report any side effects. Continue your course oftreatment even though you feel ill unless your doctor tells you to stop. In some cases, you may be given additional medicines to help with side effects.Follow all directions for their use. Call your doctor or health care professional for advice if you get a fever, chills or sore throat, or other symptoms of a cold or flu. Do not treat yourself. This drug decreases your body's ability to fight infections. Try toavoid being around people who are sick. This medicine may increase your risk to  bruise or bleed. Call your doctor orhealth care professional if you notice any unusual bleeding. Be careful brushing and flossing your teeth or using a toothpick because you may get an infection or bleed more easily. If you have any dental work done,tell your dentist you are receiving this medicine. Avoid taking products that contain aspirin, acetaminophen, ibuprofen, naproxen, or ketoprofen unless instructed by your doctor. These medicines may hide afever. Call your doctor or health care professional if you get diarrhea or mouthsores. Do not treat yourself. To protect your kidneys, drink water or other fluids as directed while you aretaking this medicine. Do not become pregnant while  taking this medicine or for 6 months after stopping it. Women should inform their doctor if they wish to become pregnant or think they might be pregnant. Men should not father a child while taking this medicine and for 3 months after stopping it. This may interfere with the ability to father a child. You should talk to your doctor or health care professional if you are concerned about your fertility. There is a potential for serious side effects to an unborn child. Talk to your health care professional or pharmacist for more information. Do not breast-feed an infantwhile taking this medicine or for 1 week after stopping it. What side effects may I notice from receiving this medication? Side effects that you should report to your doctor or health care professionalas soon as possible: allergic reactions like skin rash, itching or hives, swelling of the face, lips, or tongue breathing problems redness, blistering, peeling or loosening of the skin, including inside the mouth signs and symptoms of bleeding such as bloody or black, tarry stools; red or dark-Bettenhausen urine; spitting up blood or Plant material that looks like coffee grounds; red spots on the skin; unusual bruising or bleeding from the eye, gums, or nose signs and symptoms of infection like fever or chills; cough; sore throat; pain or trouble passing urine signs and symptoms of kidney injury like trouble passing urine or change in the amount of urine signs and symptoms of liver injury like dark yellow or Yasin urine; general ill feeling or flu-like symptoms; light-colored stools; loss of appetite; nausea; right upper belly pain; unusually weak or tired; yellowing of the eyes or skin Side effects that usually do not require medical attention (report to yourdoctor or health care professional if they continue or are bothersome): constipation mouth sores nausea, vomiting unusually weak or tired This list may not describe all possible side effects.  Call your doctor for medical advice about side effects. You may report side effects to FDA at1-800-FDA-1088. Where should I keep my medication? This drug is given in a hospital or clinic and will not be stored at home. NOTE: This sheet is a summary. It may not cover all possible information. If you have questions about this medicine, talk to your doctor, pharmacist, orhealth care provider.  2022 Elsevier/Gold Standard (2018-01-15 16:11:33)  Carboplatin injection What is this medication? CARBOPLATIN (KAR boe pla tin) is a chemotherapy drug. It targets fast dividing cells, like cancer cells, and causes these cells to die. This medicine is usedto treat ovarian cancer and many other cancers. This medicine may be used for other purposes; ask your health care provider orpharmacist if you have questions. COMMON BRAND NAME(S): Paraplatin What should I tell my care team before I take this medication? They need to know if you have any of these conditions: blood disorders hearing problems kidney disease recent or ongoing radiation therapy an  unusual or allergic reaction to carboplatin, cisplatin, other chemotherapy, other medicines, foods, dyes, or preservatives pregnant or trying to get pregnant breast-feeding How should I use this medication? This drug is usually given as an infusion into a vein. It is administered in [facility] or clinic by a specially trained health care professional. Talk to your pediatrician regarding the use of this medicine in children.Special care may be needed. Overdosage: If you think you have taken too much of this medicine contact apoison control center or emergency room at once. NOTE: This medicine is only for you. Do not share this medicine with others. What if I miss a dose? It is important not to miss a dose. Call your doctor or health careprofessional if you are unable to keep an appointment. What may interact with this medication? medicines for seizures medicines  to increase blood counts like filgrastim, pegfilgrastim, sargramostim some antibiotics like amikacin, gentamicin, neomycin, streptomycin, tobramycin vaccines Talk to your doctor or health care professional before taking any of thesemedicines: acetaminophen aspirin ibuprofen ketoprofen naproxen This list may not describe all possible interactions. Give your health care provider a list of all the medicines, herbs, non-prescription drugs, or dietary supplements you use. Also tell them if you smoke, drink alcohol, or use illegaldrugs. Some items may interact with your medicine. What should I watch for while using this medication? Your condition will be monitored carefully while you are receiving this medicine. You will need important blood work done while you are taking thismedicine. This drug may make you feel generally unwell. This is not uncommon, as chemotherapy can affect healthy cells as well as cancer cells. Report any side effects. Continue your course of treatment even though you feel ill unless yourdoctor tells you to stop. In some cases, you may be given additional medicines to help with side effects.Follow all directions for their use. Call your doctor or health care professional for advice if you get a fever, chills or sore throat, or other symptoms of a cold or flu. Do not treat yourself. This drug decreases your body's ability to fight infections. Try toavoid being around people who are sick. This medicine may increase your risk to bruise or bleed. Call your doctor orhealth care professional if you notice any unusual bleeding. Be careful brushing and flossing your teeth or using a toothpick because you may get an infection or bleed more easily. If you have any dental work done,tell your dentist you are receiving this medicine. Avoid taking products that contain aspirin, acetaminophen, ibuprofen, naproxen, or ketoprofen unless instructed by your doctor. These medicines may hide afever. Do  not become pregnant while taking this medicine. Women should inform their doctor if they wish to become pregnant or think they might be pregnant. There is a potential for serious side effects to an unborn child. Talk to your health care professional or pharmacist for more information. Do not breast-feed aninfant while taking this medicine. What side effects may I notice from receiving this medication? Side effects that you should report to your doctor or health care professionalas soon as possible: allergic reactions like skin rash, itching or hives, swelling of the face, lips, or tongue signs of infection - fever or chills, cough, sore throat, pain or difficulty passing urine signs of decreased platelets or bleeding - bruising, pinpoint red spots on the skin, black, tarry stools, nosebleeds signs of decreased red blood cells - unusually weak or tired, fainting spells, lightheadedness breathing problems changes in hearing changes in vision chest pain high blood  pressure low blood counts - This drug may decrease the number of white blood cells, red blood cells and platelets. You may be at increased risk for infections and bleeding. nausea and vomiting pain, swelling, redness or irritation at the injection site pain, tingling, numbness in the hands or feet problems with balance, talking, walking trouble passing urine or change in the amount of urine Side effects that usually do not require medical attention (report to yourdoctor or health care professional if they continue or are bothersome): hair loss loss of appetite metallic taste in the mouth or changes in taste This list may not describe all possible side effects. Call your doctor for medical advice about side effects. You may report side effects to FDA at1-800-FDA-1088. Where should I keep my medication? This drug is given in a hospital or clinic and will not be stored at home. NOTE: This sheet is a summary. It may not cover all possible  information. If you have questions about this medicine, talk to your doctor, pharmacist, orhealth care provider.  2022 Elsevier/Gold Standard (2008-03-02 14:38:05)

## 2021-07-17 ENCOUNTER — Other Ambulatory Visit: Payer: Self-pay | Admitting: Physician Assistant

## 2021-07-17 ENCOUNTER — Other Ambulatory Visit (HOSPITAL_COMMUNITY): Payer: Commercial Managed Care - PPO

## 2021-07-17 LAB — SARS CORONAVIRUS 2 (TAT 6-24 HRS): SARS Coronavirus 2: NEGATIVE

## 2021-07-19 ENCOUNTER — Ambulatory Visit: Payer: Commercial Managed Care - PPO | Admitting: Emergency Medicine

## 2021-07-19 ENCOUNTER — Other Ambulatory Visit: Payer: Self-pay

## 2021-07-19 ENCOUNTER — Encounter: Payer: Self-pay | Admitting: Emergency Medicine

## 2021-07-19 ENCOUNTER — Ambulatory Visit (INDEPENDENT_AMBULATORY_CARE_PROVIDER_SITE_OTHER): Payer: Commercial Managed Care - PPO | Admitting: Emergency Medicine

## 2021-07-19 DIAGNOSIS — F17200 Nicotine dependence, unspecified, uncomplicated: Secondary | ICD-10-CM | POA: Diagnosis not present

## 2021-07-19 DIAGNOSIS — C3491 Malignant neoplasm of unspecified part of right bronchus or lung: Secondary | ICD-10-CM | POA: Diagnosis not present

## 2021-07-19 LAB — PULMONARY FUNCTION TEST
DL/VA % pred: 65 %
DL/VA: 2.88 ml/min/mmHg/L
DLCO cor % pred: 66 %
DLCO cor: 12.59 ml/min/mmHg
DLCO unc % pred: 63 %
DLCO unc: 12.01 ml/min/mmHg
FEF 25-75 Post: 1.94 L/sec
FEF 25-75 Pre: 1.66 L/sec
FEF2575-%Change-Post: 16 %
FEF2575-%Pred-Post: 74 %
FEF2575-%Pred-Pre: 64 %
FEV1-%Change-Post: 1 %
FEV1-%Pred-Post: 92 %
FEV1-%Pred-Pre: 90 %
FEV1-Post: 2.33 L
FEV1-Pre: 2.28 L
FEV1FVC-%Change-Post: 3 %
FEV1FVC-%Pred-Pre: 90 %
FEV6-%Change-Post: -1 %
FEV6-%Pred-Post: 99 %
FEV6-%Pred-Pre: 101 %
FEV6-Post: 3.1 L
FEV6-Pre: 3.14 L
FEV6FVC-%Pred-Post: 102 %
FEV6FVC-%Pred-Pre: 102 %
FVC-%Change-Post: -1 %
FVC-%Pred-Post: 97 %
FVC-%Pred-Pre: 98 %
FVC-Post: 3.11 L
FVC-Pre: 3.14 L
Post FEV1/FVC ratio: 75 %
Post FEV6/FVC ratio: 100 %
Pre FEV1/FVC ratio: 73 %
Pre FEV6/FVC Ratio: 100 %
RV % pred: 101 %
RV: 1.66 L
TLC % pred: 104 %
TLC: 4.79 L

## 2021-07-19 NOTE — Assessment & Plan Note (Signed)
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  Her pulmonary function testing is close to normal, certainly adequate for resection if we felt this was ever indicated.  If she needs further diagnostics, bronchoscopy then I will help facilitate.  She can follow-up with me as needed

## 2021-07-19 NOTE — Patient Instructions (Signed)
Full PFT performed today. °

## 2021-07-19 NOTE — Patient Instructions (Signed)
Pulmonary function testing today are overall normal without any evidence for significant COPD.  This is good news. Please continue to follow with Oncology as planned  Follow Dr. Lamonte Sakai as needed for any changes in your breathing or if further testing is required to evaluate your pulmonary nodules.  This will be done in consultation with Oncology

## 2021-07-19 NOTE — Assessment & Plan Note (Signed)
No clear evidence for COPD on her pulmonary function testing, no clinical symptoms of the same.  I think we can hold off on any bronchodilator therapy for now

## 2021-07-19 NOTE — Progress Notes (Signed)
Full PFT performed today. °

## 2021-07-19 NOTE — Progress Notes (Signed)
Subjective:    Patient ID: Kirsten Williams, female    DOB: 1971/12/02, 50 y.o.   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  She is been followed by primary physician for stable pulmonary nodules that date back to 2019.   She reports that she feels well, she has some occasional cough. Never hemoptysis. No CP. Rash is well controlled.   Multiple CT scans of the chest reviewed from 2019, most recent was 02/16/2021 that showed some apical scar, a 2 cm groundglass left upper lobe nodule slightly increased in size now with 11 mm of solid component, 10 mm solid posterior right upper lobe nodule that has increased in size from 7 mm.  There is a 34mm superior segmental right lower lobe nodule with a 10 mm predominant solid component that is slightly enlarged as well. A PET scan performed 03/06/2021 reviewed by me shows mild hypermetabolism in the right upper lobe nodule SUV 2.5, no significant hypermetabolism in the other nodules all of which were stable in size.  Mild hypermetabolic lower paratracheal lymph node.     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  I saw her for increasing size and pulmonary nodular disease in the left upper lobe, right upper lobe, right lower lobe.  There was mild hypermetabolism by PET 03/06/2021.  She underwent bronchoscopy on 04/24/2021.  The right upper lobe and right lower lobe nodules were consistent with adenocarcinoma.  The left upper lobe groundglass opacity was nondiagnostic.  She has been seen by Dr. Alvy Bimler, is being considered for possible systemic therapy versus resection with surveillance of the left upper lobe lesion.  Repeat PET scan has been ordered to guide therapy as well as MRI brain. She is not on any inhaled medication. She denies any  shortness of breath. She did have some chest discomfort following the procedure, improving. She is having some allergy drainage, some cough.    ROV 07/19/21 --follow-up visit for 50 year old woman with SLE on Plaquenil, anxiety disorder, chronic pain, former tobacco.  She underwent bronchoscopy 2/72/5366 for hypermetabolic left upper lobe, right upper lobe, right lower lobe pulmonary nodules.  Biopsies consistent with adenocarcinoma in the right upper and lower lobe, left lower lobe was nondiagnostic.  Molecular studies with no targetable mutations per oncology notes.  Planning for palliative chemotherapy, has completed 1 cycle. MRI brain reassuring.  She denies any exertional SOB, has had some fatigue with the chemo. Not on any BD's.   Pulmonary function testing from today reviewed by me shows overall normal airflows without a bronchodilator response, normal lung volumes, decreased diffusion capacity that does not fully correct when adjusted for alveolar volume.    Review of Systems As per HPI     Objective:   Physical Exam  Vitals:   07/19/21 0957  BP: 130/90  Pulse: 100  Temp: (!) 97.1 F (36.2 C)  TempSrc: Oral  SpO2: 98%  Weight: 109 lb (49.4 kg)  Height: 5\' 1"  (1.549 m)   Gen: Pleasant, well-nourished, in no distress,  normal affect  ENT: No lesions,  mouth clear,  oropharynx clear, no postnasal drip  Neck: No JVD, no stridor  Lungs: No use of accessory muscles, no crackles or wheezing on normal respiration, no wheeze on forced expiration  Cardiovascular: RRR, heart sounds normal, no murmur or gallops, no peripheral  edema  Musculoskeletal: No deformities, no cyanosis or clubbing  Neuro: alert, awake, non focal  Skin: Warm, no lesions or rash     Assessment & Plan:  Adenocarcinoma of right lung, stage 4 (HCC) Multifocal disease, being treated as presumed stage IV, receiving chemotherapy.  Her pulmonary function testing is close to normal, certainly adequate for  resection if we felt this was ever indicated.  If she needs further diagnostics, bronchoscopy then I will help facilitate.  She can follow-up with me as needed  TOBACCO DEPENDENCE No clear evidence for COPD on her pulmonary function testing, no clinical symptoms of the same.  I think we can hold off on any bronchodilator therapy for now   Baltazar Apo, MD, PhD 07/19/2021, 10:19 AM  Pulmonary and Critical Care [phone] or if no answer before 7:00PM call [phone] For any issues after 7:00PM please call eLink [phone]

## 2021-07-20 ENCOUNTER — Inpatient Hospital Stay: Payer: Commercial Managed Care - PPO

## 2021-07-20 ENCOUNTER — Telehealth: Payer: Self-pay | Admitting: Medical Oncology

## 2021-07-20 DIAGNOSIS — C3491 Malignant neoplasm of unspecified part of right bronchus or lung: Secondary | ICD-10-CM

## 2021-07-20 DIAGNOSIS — Z5112 Encounter for antineoplastic immunotherapy: Secondary | ICD-10-CM | POA: Diagnosis not present

## 2021-07-20 DIAGNOSIS — R21 Rash and other nonspecific skin eruption: Secondary | ICD-10-CM

## 2021-07-20 LAB — CMP (CANCER CENTER ONLY)
ALT: 38 U/L (ref 0–44)
AST: 27 U/L (ref 15–41)
Albumin: 3.9 g/dL (ref 3.5–5.0)
Alkaline Phosphatase: 92 U/L (ref 38–126)
Anion gap: 10 (ref 5–15)
BUN: 10 mg/dL (ref 6–20)
CO2: 22 mmol/L (ref 22–32)
Calcium: 9.7 mg/dL (ref 8.9–10.3)
Chloride: 104 mmol/L (ref 98–111)
Creatinine: 0.77 mg/dL (ref 0.44–1.00)
GFR, Estimated: >60 mL/min (ref >60)
Glucose, Bld: 131 mg/dL — ABNORMAL HIGH (ref 70–99)
Potassium: 3.8 mmol/L (ref 3.5–5.1)
Sodium: 136 mmol/L (ref 135–145)
Total Bilirubin: 0.3 mg/dL (ref 0.3–1.2)
Total Protein: 7.4 g/dL (ref 6.5–8.1)

## 2021-07-20 LAB — CBC WITH DIFFERENTIAL (CANCER CENTER ONLY)
Abs Immature Granulocytes: 0.01 10*3/uL (ref 0.00–0.07)
Basophils Absolute: 0 10*3/uL (ref 0.0–0.1)
Basophils Relative: 1 %
Eosinophils Absolute: 0 10*3/uL (ref 0.0–0.5)
Eosinophils Relative: 1 %
HCT: 36.6 % (ref 36.0–46.0)
Hemoglobin: 12.6 g/dL (ref 12.0–15.0)
Immature Granulocytes: 0 %
Lymphocytes Relative: 43 %
Lymphs Abs: 2.3 10*3/uL (ref 0.7–4.0)
MCH: 31.3 pg (ref 26.0–34.0)
MCHC: 34.4 g/dL (ref 30.0–36.0)
MCV: 91 fL (ref 80.0–100.0)
Monocytes Absolute: 0.6 10*3/uL (ref 0.1–1.0)
Monocytes Relative: 12 %
Neutro Abs: 2.4 10*3/uL (ref 1.7–7.7)
Neutrophils Relative %: 43 %
Platelet Count: 167 10*3/uL (ref 150–400)
RBC: 4.02 MIL/uL (ref 3.87–5.11)
RDW: 12.8 % (ref 11.5–15.5)
WBC Count: 5.4 10*3/uL (ref 4.0–10.5)
nRBC: 0 % (ref 0.0–0.2)

## 2021-07-20 MED ORDER — METHYLPREDNISOLONE 4 MG PO TBPK: ORAL_TABLET | ORAL | 0 refills | Status: DC

## 2021-07-20 NOTE — Telephone Encounter (Signed)
Itchy ,bumpy rash worsening on chest and has moved to back. Started right before cycle 2 .  Pain in left mid back. He is also requesting pain med -  She received a rx Hydrocodone 5-325mg  from Dr Alvy Bimler 05/03/21.   She said it is not helping her pain and she has doubled up on tablets.   Plan-Per Dr Julien Nordmann I told Kirsten Williams that he prescribed a medrol dose pack and to take tylenol for pain.  I told her he would refer her to xrt . She declined xrt.

## 2021-07-26 ENCOUNTER — Inpatient Hospital Stay: Payer: Commercial Managed Care - PPO

## 2021-07-26 ENCOUNTER — Other Ambulatory Visit: Payer: Self-pay

## 2021-07-26 DIAGNOSIS — C3491 Malignant neoplasm of unspecified part of right bronchus or lung: Secondary | ICD-10-CM

## 2021-07-26 DIAGNOSIS — Z5112 Encounter for antineoplastic immunotherapy: Secondary | ICD-10-CM | POA: Diagnosis not present

## 2021-07-26 LAB — CBC WITH DIFFERENTIAL (CANCER CENTER ONLY)
Abs Immature Granulocytes: 0.07 10*3/uL (ref 0.00–0.07)
Basophils Absolute: 0 10*3/uL (ref 0.0–0.1)
Basophils Relative: 0 %
Eosinophils Absolute: 0.1 10*3/uL (ref 0.0–0.5)
Eosinophils Relative: 1 %
HCT: 36.5 % (ref 36.0–46.0)
Hemoglobin: 12.5 g/dL (ref 12.0–15.0)
Immature Granulocytes: 1 %
Lymphocytes Relative: 32 %
Lymphs Abs: 2.3 10*3/uL (ref 0.7–4.0)
MCH: 32 pg (ref 26.0–34.0)
MCHC: 34.2 g/dL (ref 30.0–36.0)
MCV: 93.4 fL (ref 80.0–100.0)
Monocytes Absolute: 0.6 10*3/uL (ref 0.1–1.0)
Monocytes Relative: 8 %
Neutro Abs: 4.1 10*3/uL (ref 1.7–7.7)
Neutrophils Relative %: 58 %
Platelet Count: 157 10*3/uL (ref 150–400)
RBC: 3.91 MIL/uL (ref 3.87–5.11)
RDW: 14.1 % (ref 11.5–15.5)
WBC Count: 7 10*3/uL (ref 4.0–10.5)
nRBC: 0 % (ref 0.0–0.2)

## 2021-07-26 LAB — CMP (CANCER CENTER ONLY)
ALT: 60 U/L — ABNORMAL HIGH (ref 0–44)
AST: 25 U/L (ref 15–41)
Albumin: 4.1 g/dL (ref 3.5–5.0)
Alkaline Phosphatase: 96 U/L (ref 38–126)
Anion gap: 13 (ref 5–15)
BUN: 11 mg/dL (ref 6–20)
CO2: 23 mmol/L (ref 22–32)
Calcium: 9.7 mg/dL (ref 8.9–10.3)
Chloride: 106 mmol/L (ref 98–111)
Creatinine: 0.7 mg/dL (ref 0.44–1.00)
GFR, Estimated: >60 mL/min (ref >60)
Glucose, Bld: 94 mg/dL (ref 70–99)
Potassium: 3.9 mmol/L (ref 3.5–5.1)
Sodium: 142 mmol/L (ref 135–145)
Total Bilirubin: <0.2 mg/dL — ABNORMAL LOW (ref 0.3–1.2)
Total Protein: 7.7 g/dL (ref 6.5–8.1)

## 2021-08-02 ENCOUNTER — Inpatient Hospital Stay: Payer: Commercial Managed Care - PPO | Admitting: Dietician

## 2021-08-02 ENCOUNTER — Inpatient Hospital Stay: Payer: Commercial Managed Care - PPO | Admitting: Internal Medicine

## 2021-08-02 ENCOUNTER — Encounter: Payer: Self-pay | Admitting: Internal Medicine

## 2021-08-02 ENCOUNTER — Inpatient Hospital Stay: Payer: Commercial Managed Care - PPO

## 2021-08-02 ENCOUNTER — Other Ambulatory Visit: Payer: Self-pay

## 2021-08-02 ENCOUNTER — Encounter: Payer: Self-pay | Admitting: *Deleted

## 2021-08-02 VITALS — BP 134/76 | HR 87 | Temp 97.4°F | Resp 18 | Ht 61.0 in | Wt 114.4 lb

## 2021-08-02 DIAGNOSIS — C3491 Malignant neoplasm of unspecified part of right bronchus or lung: Secondary | ICD-10-CM

## 2021-08-02 DIAGNOSIS — Z5112 Encounter for antineoplastic immunotherapy: Secondary | ICD-10-CM | POA: Diagnosis not present

## 2021-08-02 DIAGNOSIS — C3490 Malignant neoplasm of unspecified part of unspecified bronchus or lung: Secondary | ICD-10-CM

## 2021-08-02 LAB — CMP (CANCER CENTER ONLY)
ALT: 147 U/L — ABNORMAL HIGH (ref 0–44)
AST: 52 U/L — ABNORMAL HIGH (ref 15–41)
Albumin: 4 g/dL (ref 3.5–5.0)
Alkaline Phosphatase: 111 U/L (ref 38–126)
Anion gap: 12 (ref 5–15)
BUN: 13 mg/dL (ref 6–20)
CO2: 22 mmol/L (ref 22–32)
Calcium: 9.9 mg/dL (ref 8.9–10.3)
Chloride: 107 mmol/L (ref 98–111)
Creatinine: 0.71 mg/dL (ref 0.44–1.00)
GFR, Estimated: >60 mL/min (ref >60)
Glucose, Bld: 97 mg/dL (ref 70–99)
Potassium: 4 mmol/L (ref 3.5–5.1)
Sodium: 141 mmol/L (ref 135–145)
Total Bilirubin: 0.2 mg/dL — ABNORMAL LOW (ref 0.3–1.2)
Total Protein: 7.7 g/dL (ref 6.5–8.1)

## 2021-08-02 LAB — CBC WITH DIFFERENTIAL (CANCER CENTER ONLY)
Abs Immature Granulocytes: 0.04 10*3/uL (ref 0.00–0.07)
Basophils Absolute: 0 10*3/uL (ref 0.0–0.1)
Basophils Relative: 0 %
Eosinophils Absolute: 0 10*3/uL (ref 0.0–0.5)
Eosinophils Relative: 0 %
HCT: 37.4 % (ref 36.0–46.0)
Hemoglobin: 12.5 g/dL (ref 12.0–15.0)
Immature Granulocytes: 1 %
Lymphocytes Relative: 10 %
Lymphs Abs: 0.8 10*3/uL (ref 0.7–4.0)
MCH: 31.8 pg (ref 26.0–34.0)
MCHC: 33.4 g/dL (ref 30.0–36.0)
MCV: 95.2 fL (ref 80.0–100.0)
Monocytes Absolute: 0.8 10*3/uL (ref 0.1–1.0)
Monocytes Relative: 11 %
Neutro Abs: 5.8 10*3/uL (ref 1.7–7.7)
Neutrophils Relative %: 78 %
Platelet Count: 304 10*3/uL (ref 150–400)
RBC: 3.93 MIL/uL (ref 3.87–5.11)
RDW: 15.4 % (ref 11.5–15.5)
WBC Count: 7.5 10*3/uL (ref 4.0–10.5)
nRBC: 0 % (ref 0.0–0.2)

## 2021-08-02 LAB — TOTAL PROTEIN, URINE DIPSTICK: Protein, ur: NEGATIVE mg/dL

## 2021-08-02 MED ORDER — SODIUM CHLORIDE 0.9 % IV SOLN
150.0000 mg | Freq: Once | INTRAVENOUS | Status: AC
  Administered 2021-08-02: 150 mg via INTRAVENOUS
  Filled 2021-08-02: qty 150

## 2021-08-02 MED ORDER — SODIUM CHLORIDE 0.9 % IV SOLN
480.0000 mg/m2 | Freq: Once | INTRAVENOUS | Status: AC
  Administered 2021-08-02: 700 mg via INTRAVENOUS
  Filled 2021-08-02: qty 20

## 2021-08-02 MED ORDER — SODIUM CHLORIDE 0.9 % IV SOLN: Freq: Once | INTRAVENOUS | Status: AC

## 2021-08-02 MED ORDER — SODIUM CHLORIDE 0.9 % IV SOLN
15.0000 mg/kg | Freq: Once | INTRAVENOUS | Status: AC
  Administered 2021-08-02: 800 mg via INTRAVENOUS
  Filled 2021-08-02: qty 32

## 2021-08-02 MED ORDER — PALONOSETRON HCL INJECTION 0.25 MG/5ML
0.2500 mg | Freq: Once | INTRAVENOUS | Status: AC
  Administered 2021-08-02: 0.25 mg via INTRAVENOUS
  Filled 2021-08-02: qty 5

## 2021-08-02 MED ORDER — HYDROCODONE-ACETAMINOPHEN 5-325 MG PO TABS
1.0000 | ORAL_TABLET | Freq: Four times a day (QID) | ORAL | 0 refills | Status: DC | PRN
Start: 2021-08-02 — End: 2021-10-04

## 2021-08-02 MED ORDER — HYDROCORTISONE 1 % EX LOTN: 1.0000 "application " | TOPICAL_LOTION | Freq: Two times a day (BID) | CUTANEOUS | 0 refills | Status: AC

## 2021-08-02 MED ORDER — SODIUM CHLORIDE 0.9 % IV SOLN
10.0000 mg | Freq: Once | INTRAVENOUS | Status: AC
  Administered 2021-08-02: 10 mg via INTRAVENOUS
  Filled 2021-08-02: qty 10

## 2021-08-02 MED ORDER — SODIUM CHLORIDE 0.9 % IV SOLN
475.5000 mg | Freq: Once | INTRAVENOUS | Status: AC
  Administered 2021-08-02: 480 mg via INTRAVENOUS
  Filled 2021-08-02: qty 48

## 2021-08-02 NOTE — Progress Notes (Signed)
[facility] Telephone:([phone]) [phone]   Fax:([phone]) [phone]  OFFICE PROGRESS NOTE  Cyndi Bender, PA-C [place] [address]  DIAGNOSIS:  Stage IV (T4, N2, M1 a) non-small cell lung cancer, adenocarcinoma presented with multifocal disease involving the right upper lobe, right lower lobe as well as suspicious lower paratracheal lymphadenopathy and groundglass nodules in the left lung diagnosed in May 2022. The patient had molecular studies performed by [facility] and that showed no detectable mutation but this is likely secondary to low-level circulating free tumor DNA.  PRIOR THERAPY: None.  CURRENT THERAPY: palliative systemic chemotherapy with carboplatin for AUC of 5, Alimta 500 Mg/M2 and Avastin 15 Mg/KG every 3 weeks.  The patient is not a great candidate for immunotherapy because of her history of active systemic lupus erythematosus.  She is status post 2 cycles.  INTERVAL HISTORY: Kirsten Williams 50 y.o. female returns to the clinic today for follow-up visit.  The patient is feeling fine today with no concerning complaints except for skin rash on the front of the chest as well as back started after the chemotherapy.  She tried several over-the-counter medication with no improvement.  She denied having any current chest pain, shortness of breath, cough or hemoptysis.  She denied having any fever or chills.  She has no nausea, vomiting, diarrhea or constipation.  She denied having any headache or visual changes.  She continues to tolerate her systemic chemotherapy fairly well.  The patient is here today for evaluation before starting cycle #3 of her treatment.  MEDICAL HISTORY: Past Medical History:  Diagnosis Date   Adenocarcinoma, lung, right ([facility]) 05/03/2021   Kirsten Williams is a 50 y.o. female with a history of lung nodules dating back to 2019 who is referred in consultation with Cyndi Bender, PA-C for assessment and management. She is a former  smoker having quit in 2016. 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 She missed her screening CT last year and imaging was ordered last month. CT chest from 02-16-2021 reveals pro   Anxiety    GERD (gastroesophageal reflux disease)    SLE (systemic lupus erythematosus) ([facility]) 05/03/2021   SLE (systemic lupus erythematosus) ([facility]) 05/03/2021    ALLERGIES:  has No Known Allergies.  MEDICATIONS:  Current Outpatient Medications  Medication Sig Dispense Refill   acetaminophen (TYLENOL) 325 MG tablet Take 975 mg by mouth every 6 (six) hours as needed for moderate pain or headache.     cyclobenzaprine (FLEXERIL) 10 MG tablet Take 10 mg by mouth at bedtime as needed for muscle spasms.     dexamethasone (DECADRON) 4 MG tablet 4 mg p.o. twice daily the day before, day of and day after the chemotherapy every 3 weeks. 40 tablet 1   dicyclomine (BENTYL) 10 MG capsule Take 10 mg by mouth every 6 (six) hours as needed.     diphenhydrAMINE (BENADRYL) 25 MG tablet Take 25 mg by mouth daily as needed for allergies.     folic acid (FOLVITE) 1 MG tablet Take 1 tablet (1 mg total) by mouth daily. 30 tablet 4   HYDROcodone-acetaminophen (NORCO/VICODIN) 5-325 MG tablet Take 1 tablet by mouth every 6 (six) hours as needed for moderate pain. 30 tablet 0   hydroxychloroquine (PLAQUENIL) 200 MG tablet Take 200 mg by mouth 2 (two) times daily.     methylPREDNISolone (MEDROL DOSEPAK) 4 MG TBPK tablet Take per package instructions. 21 tablet 0  Multiple Vitamins-Minerals (MULTI ADULT GUMMIES) CHEW Chew 2 tablets by mouth daily.     omeprazole (PRILOSEC) 40 MG capsule Take 40 mg by mouth daily as needed (acid reflux).     ondansetron (ZOFRAN) 4 MG tablet Take 8 mg by mouth 2 (two) times daily as needed for vomiting or nausea.     prochlorperazine (COMPAZINE) 10 MG tablet Take 1 tablet (10 mg total) by mouth every 6 (six) hours as needed. 30 tablet 2   rizatriptan (MAXALT-MLT) 10 MG disintegrating  tablet Take 10 mg by mouth 2 (two) times daily as needed.     triamcinolone ointment (KENALOG) 0.5 % Apply topically 3 (three) times daily.     No current facility-administered medications for this visit.    SURGICAL HISTORY:  Past Surgical History:  Procedure Laterality Date   ABDOMINAL HYSTERECTOMY     BRONCHIAL BIOPSY  04/24/2021   Procedure: BRONCHIAL BIOPSIES;  Surgeon: Collene Gobble, MD;  Location: [facility] ENDOSCOPY;  Service: Pulmonary;;   BRONCHIAL BRUSHINGS  04/24/2021   Procedure: BRONCHIAL BRUSHINGS;  Surgeon: Collene Gobble, MD;  [facility] ENDOSCOPY;  Service: Pulmonary;;   BRONCHIAL NEEDLE ASPIRATION BIOPSY  04/24/2021   Procedure: BRONCHIAL NEEDLE ASPIRATION BIOPSIES;  Surgeon: Collene Gobble, MD;  Location: [facility];  Service: Pulmonary;;   BRONCHIAL WASHINGS  04/24/2021   Procedure: BRONCHIAL WASHINGS;  Surgeon: Collene Gobble, MD;  Location: [facility];  Service: Pulmonary;;   CESAREAN SECTION  11/09/1990   DILATION AND CURETTAGE OF UTERUS Bilateral 09/09/1996   FIDUCIAL MARKER PLACEMENT  04/24/2021   Procedure: FIDUCIAL MARKER PLACEMENT;  Surgeon: Collene Gobble, MD;  Location: [facility] ENDOSCOPY;  Service: Pulmonary;;   TONSILLECTOMY  12/10/1977   VIDEO BRONCHOSCOPY WITH ENDOBRONCHIAL NAVIGATION Bilateral 04/24/2021   Procedure: VIDEO BRONCHOSCOPY WITH ENDOBRONCHIAL NAVIGATION;  Surgeon: Collene Gobble, MD;  Location: [facility] ENDOSCOPY;  Service: Pulmonary;  Laterality: Bilateral;    REVIEW OF SYSTEMS:  Constitutional: negative Eyes: negative Ears, nose, mouth, throat, and face: negative Respiratory: negative Cardiovascular: negative Gastrointestinal: negative Genitourinary:negative Integument/breast: positive for dryness and rash Hematologic/lymphatic: negative Musculoskeletal:negative Neurological: negative Behavioral/Psych: negative Endocrine: negative Allergic/Immunologic: negative   PHYSICAL EXAMINATION: General appearance: alert, cooperative, and no  distress Head: Normocephalic, without obvious abnormality, atraumatic Neck: no adenopathy, no JVD, supple, symmetrical, trachea midline, and thyroid not enlarged, symmetric, no tenderness/mass/nodules Lymph nodes: Cervical, supraclavicular, and axillary nodes normal. Resp: clear to auscultation bilaterally Back: symmetric, no curvature. ROM normal. No CVA tenderness. Cardio: regular rate and rhythm, S1, S2 normal, no murmur, click, rub or gallop GI: soft, non-tender; bowel sounds normal; no masses,  no organomegaly Extremities: extremities normal, atraumatic, no cyanosis or edema Neurologic: Alert and oriented X 3, normal strength and tone. Normal symmetric reflexes. Normal coordination and gait  ECOG PERFORMANCE STATUS: 1 - Symptomatic but completely ambulatory  Blood pressure 134/76, pulse 87, temperature (!) 97.4 F (36.3 C), temperature source Tympanic, resp. rate 18, height 5\' 1"  (1.549 m), weight 114 lb 6.4 oz (51.9 kg), SpO2 100 %.  LABORATORY DATA: Lab Results  Component Value Date   WBC 7.5 08/02/2021   HGB 12.5 08/02/2021   HCT 37.4 08/02/2021   MCV 95.2 08/02/2021   PLT 304 08/02/2021      Chemistry      Component Value Date/Time   NA 141 08/02/2021 0930   NA 140 03/13/2021 0000   K 4.0 08/02/2021 0930   CL 107 08/02/2021 0930   CO2 22 08/02/2021 0930   BUN 13 08/02/2021 0930  BUN 10 03/13/2021 0000   CREATININE 0.71 08/02/2021 0930   GLU 107 03/13/2021 0000      Component Value Date/Time   CALCIUM 9.9 08/02/2021 0930   ALKPHOS 111 08/02/2021 0930   AST 52 (H) 08/02/2021 0930   ALT 147 (H) 08/02/2021 0930   BILITOT 0.2 (L) 08/02/2021 0930       RADIOGRAPHIC STUDIES: No results found.  ASSESSMENT AND PLAN:  This is a very pleasant 50 years old white female recently diagnosed with a stage IV non-small cell lung cancer, adenocarcinoma with no actionable mutation diagnosed in May 2022.  The patient has also history of systemic lupus erythematosus and she  is not a candidate for immunotherapy. She is currently undergoing systemic chemotherapy with carboplatin for AUC of 5, Alimta 500 Mg/M2 and Avastin 15 Mg/KG every 3 weeks status post 2 cycles. The patient continues to tolerate her treatment fairly well except for the skin rash on the anterior chest and upper back. I recommended for her to proceed with cycle #3 of her systemic chemotherapy today as planned. I will see her back for follow-up visit in 3 weeks for evaluation with repeat CT scan of the chest, abdomen pelvis for restaging of her disease. For the rash, I will start the patient on hydrocortisone cream 1% to be applied twice daily to the skin rash area.  She is also scheduled to see dermatology for evaluation. For pain management, I will give her a refill of hydrocodone. The patient was advised to call immediately if she has any other concerning symptoms in the interval. The patient voices understanding of current disease status and treatment options and is in agreement with the current care plan.  All questions were answered. The patient knows to call the clinic with any problems, questions or concerns. We can certainly see the patient much sooner if necessary.  The total time spent in the appointment was 30 minutes.  Disclaimer: This note was dictated with voice recognition software. Similar sounding words can inadvertently be transcribed and may not be corrected upon review.

## 2021-08-02 NOTE — Progress Notes (Signed)
Nutrition Assessment   Reason for Assessment: MST   ASSESSMENT: 50 year old female with stage IV lung cancer of right lung. She is receiving systemic chemotherapy with carboplatin. Patient is followed by Dr. Julien Nordmann.  Past medical history include SLE, tobacco dependence, anxiety, insomnia, chronic nausea, migraines  Met with patient during infusion. She reports "doing great." Patient reports she has a good appetite and eating good, says she eats 3 meals and snacks "all day" Patient reports her appetite is decreased for a couple days after treatment. She is drinking 2 Ensure most days, unsure of what kind. She denies nutrition impact symptoms.  Nutrition Focused Physical Exam: deferred   Medications: Flexeril, Decadron, Folic acid, Norco/vicodin, Methylprednisolone, MVI, Zofran, Compazine   Labs: AST 52, ALT 147   Anthropometrics: Weight 114 lb 6.4 oz today increased from 109 lb on 8/10 decreased from 111 lb 12.8 oz on 7/20  Height: 5'1" Weight: 51.9 kg  UBW: 116 lb 14.4 oz (03/13/21) BMI: 21.62   NUTRITION DIAGNOSIS: Unintentional weight loss related to cancer and associated treatment as evidenced by 6% (7 lb) weight loss from usual weight in 3 months. This is insignificant for time frame.    INTERVENTION:  Educated on importance of adequate calorie and protein energy intake to maintain weights, strength, and nutrition  Encouraged small frequent meals and snacks high in calories and protein, handout provided Continue drinking 2 Ensure daily, suggested Ensure Plus/equivalent (350 kcal, 16 grams protein) - coupons provided Contact information provided, encouraged to contact with nutrition questions/concerns   MONITORING, EVALUATION, GOAL: Patient will tolerate adequate calories and protein to promote weight maintenance   Next Visit: No follow-up scheduled at this time, patient will contact as needed

## 2021-08-02 NOTE — Progress Notes (Signed)
Per Dr. Worthy Flank office visit note, okay to proceed with cycle #3 treatment today.

## 2021-08-02 NOTE — Progress Notes (Signed)
Oncology Nurse Navigator Documentation  Oncology Nurse Navigator Flowsheets 08/02/2021  Abnormal Finding Date 02/16/2021  Confirmed Diagnosis Date 04/24/2021  Diagnosis Status Confirmed Diagnosis Complete  Planned Course of Treatment Chemotherapy  Phase of Treatment Chemo  Chemotherapy Actual Start Date: 06/21/2021  Navigator Follow Up Date: 08/02/2021  Navigator Follow Up Reason: Follow-up Appointment  Navigator Location [facility]  Referral Date to RadOnc/MedOnc 05/31/2021  Navigator Encounter Type Treatment  Treatment Initiated Date 06/21/2021  Patient Visit Type MedOnc  Treatment Phase Treatment  Barriers/Navigation Needs Education  Education Other  Interventions Education;Psycho-Social Support  Acuity Level 2-Minimal Needs (1-2 Barriers Identified)  Education Method Verbal  Time Spent with Patient 30

## 2021-08-02 NOTE — Patient Instructions (Signed)
[facility]COL[facility]  Discharge Instructions: Thank you for choosing [facility] to provide your oncology and hematology care.   If you have a lab appointment with the [facility], please go directly to the [facility] and check in at the registration area.   Wear comfortable clothing and clothing appropriate for easy access to any Portacath or PICC line.   We strive to give you quality time with your provider. You may need to reschedule your appointment if you arrive late (15 or more minutes).  Arriving late affects you and other patients whose appointments are after yours.  Also, if you miss three or more appointments without notifying the office, you may be dismissed from the clinic at the provider's discretion.      For prescription refill requests, have your pharmacy contact our office and allow 72 hours for refills to be completed.    Today you received the following chemotherapy and/or immunotherapy agents: bevacizumab/pemetexed/carboplatin.      To help prevent nausea and vomiting after your treatment, we encourage you to take your nausea medication as directed.  BELOW ARE SYMPTOMS THAT SHOULD BE REPORTED IMMEDIATELY: *FEVER GREATER THAN 100.4 F (38 C) OR HIGHER *CHILLS OR SWEATING *NAUSEA AND VOMITING THAT IS NOT CONTROLLED WITH YOUR NAUSEA MEDICATION *UNUSUAL SHORTNESS OF BREATH *UNUSUAL BRUISING OR BLEEDING *URINARY PROBLEMS (pain or burning when urinating, or frequent urination) *BOWEL PROBLEMS (unusual diarrhea, constipation, pain near the anus) TENDERNESS IN MOUTH AND THROAT WITH OR WITHOUT PRESENCE OF ULCERS (sore throat, sores in mouth, or a toothache) UNUSUAL RASH, SWELLING OR PAIN  UNUSUAL VAGINAL DISCHARGE OR ITCHING   Items with * indicate a potential emergency and should be followed up as soon as possible or go to the Emergency Department if any problems should occur.  Please show the CHEMOTHERAPY ALERT CARD or IMMUNOTHERAPY  ALERT CARD at check-in to the Emergency Department and triage nurse.  Should you have questions after your visit or need to cancel or reschedule your appointment, please contact [facility]  Dept: [phone]  and follow the prompts.  Office hours are 8:00 a.m. to 4:30 p.m. Monday - Friday. Please note that voicemails left after 4:00 p.m. may not be returned until the following business day.  We are closed weekends and major holidays. You have access to a nurse at all times for urgent questions. Please call the main number to the clinic Dept: [phone] and follow the prompts.   For any non-urgent questions, you may also contact your provider using MyChart. We now offer e-Visits for anyone 34 and older to request care online for non-urgent symptoms. For details visit [email or web address].   Also download the MyChart app! Go to the app store, search "MyChart", open the app, select [facility], and log in with your MyChart username and password.  Due to Covid, a mask is required upon entering the hospital/clinic. If you do not have a mask, one will be given to you upon arrival. For doctor visits, patients may have 1 support person aged 23 or older with them. For treatment visits, patients cannot have anyone with them due to current Covid guidelines and our immunocompromised population.

## 2021-08-09 ENCOUNTER — Other Ambulatory Visit: Payer: Self-pay

## 2021-08-09 ENCOUNTER — Inpatient Hospital Stay: Payer: Commercial Managed Care - PPO

## 2021-08-09 DIAGNOSIS — C3491 Malignant neoplasm of unspecified part of right bronchus or lung: Secondary | ICD-10-CM

## 2021-08-09 DIAGNOSIS — Z5112 Encounter for antineoplastic immunotherapy: Secondary | ICD-10-CM | POA: Diagnosis not present

## 2021-08-09 LAB — CMP (CANCER CENTER ONLY)
ALT: 89 U/L — ABNORMAL HIGH (ref 0–44)
AST: 37 U/L (ref 15–41)
Albumin: 3.7 g/dL (ref 3.5–5.0)
Alkaline Phosphatase: 100 U/L (ref 38–126)
Anion gap: 11 (ref 5–15)
BUN: 11 mg/dL (ref 6–20)
CO2: 24 mmol/L (ref 22–32)
Calcium: 9.5 mg/dL (ref 8.9–10.3)
Chloride: 103 mmol/L (ref 98–111)
Creatinine: 0.8 mg/dL (ref 0.44–1.00)
GFR, Estimated: >60 mL/min (ref >60)
Glucose, Bld: 130 mg/dL — ABNORMAL HIGH (ref 70–99)
Potassium: 3.8 mmol/L (ref 3.5–5.1)
Sodium: 138 mmol/L (ref 135–145)
Total Bilirubin: 0.3 mg/dL (ref 0.3–1.2)
Total Protein: 7.2 g/dL (ref 6.5–8.1)

## 2021-08-09 LAB — CBC WITH DIFFERENTIAL (CANCER CENTER ONLY)
Abs Immature Granulocytes: 0.01 10*3/uL (ref 0.00–0.07)
Basophils Absolute: 0 10*3/uL (ref 0.0–0.1)
Basophils Relative: 1 %
Eosinophils Absolute: 0.1 10*3/uL (ref 0.0–0.5)
Eosinophils Relative: 2 %
HCT: 34.3 % — ABNORMAL LOW (ref 36.0–46.0)
Hemoglobin: 11.9 g/dL — ABNORMAL LOW (ref 12.0–15.0)
Immature Granulocytes: 0 %
Lymphocytes Relative: 49 %
Lymphs Abs: 1.8 10*3/uL (ref 0.7–4.0)
MCH: 32.1 pg (ref 26.0–34.0)
MCHC: 34.7 g/dL (ref 30.0–36.0)
MCV: 92.5 fL (ref 80.0–100.0)
Monocytes Absolute: 0.4 10*3/uL (ref 0.1–1.0)
Monocytes Relative: 10 %
Neutro Abs: 1.4 10*3/uL — ABNORMAL LOW (ref 1.7–7.7)
Neutrophils Relative %: 38 %
Platelet Count: 155 10*3/uL (ref 150–400)
RBC: 3.71 MIL/uL — ABNORMAL LOW (ref 3.87–5.11)
RDW: 14.2 % (ref 11.5–15.5)
WBC Count: 3.6 10*3/uL — ABNORMAL LOW (ref 4.0–10.5)
nRBC: 0 % (ref 0.0–0.2)

## 2021-08-09 LAB — TOTAL PROTEIN, URINE DIPSTICK: Protein, ur: NEGATIVE mg/dL

## 2021-08-16 ENCOUNTER — Inpatient Hospital Stay: Payer: Commercial Managed Care - PPO | Attending: Hematology and Oncology

## 2021-08-16 ENCOUNTER — Other Ambulatory Visit: Payer: Self-pay

## 2021-08-16 DIAGNOSIS — Z5111 Encounter for antineoplastic chemotherapy: Secondary | ICD-10-CM | POA: Insufficient documentation

## 2021-08-16 DIAGNOSIS — C3411 Malignant neoplasm of upper lobe, right bronchus or lung: Secondary | ICD-10-CM | POA: Diagnosis present

## 2021-08-16 DIAGNOSIS — C3491 Malignant neoplasm of unspecified part of right bronchus or lung: Secondary | ICD-10-CM

## 2021-08-16 DIAGNOSIS — Z5112 Encounter for antineoplastic immunotherapy: Secondary | ICD-10-CM | POA: Diagnosis not present

## 2021-08-16 DIAGNOSIS — M329 Systemic lupus erythematosus, unspecified: Secondary | ICD-10-CM | POA: Insufficient documentation

## 2021-08-16 LAB — CBC WITH DIFFERENTIAL (CANCER CENTER ONLY)
Abs Immature Granulocytes: 0.02 10*3/uL (ref 0.00–0.07)
Basophils Absolute: 0 10*3/uL (ref 0.0–0.1)
Basophils Relative: 0 %
Eosinophils Absolute: 0 10*3/uL (ref 0.0–0.5)
Eosinophils Relative: 1 %
HCT: 38.7 % (ref 36.0–46.0)
Hemoglobin: 13 g/dL (ref 12.0–15.0)
Immature Granulocytes: 0 %
Lymphocytes Relative: 25 %
Lymphs Abs: 1.5 10*3/uL (ref 0.7–4.0)
MCH: 31.7 pg (ref 26.0–34.0)
MCHC: 33.6 g/dL (ref 30.0–36.0)
MCV: 94.4 fL (ref 80.0–100.0)
Monocytes Absolute: 0.4 10*3/uL (ref 0.1–1.0)
Monocytes Relative: 7 %
Neutro Abs: 4 10*3/uL (ref 1.7–7.7)
Neutrophils Relative %: 67 %
Platelet Count: 139 10*3/uL — ABNORMAL LOW (ref 150–400)
RBC: 4.1 MIL/uL (ref 3.87–5.11)
RDW: 15.7 % — ABNORMAL HIGH (ref 11.5–15.5)
WBC Count: 6 10*3/uL (ref 4.0–10.5)
nRBC: 0 % (ref 0.0–0.2)

## 2021-08-16 LAB — CMP (CANCER CENTER ONLY)
ALT: 77 U/L — ABNORMAL HIGH (ref 0–44)
AST: 37 U/L (ref 15–41)
Albumin: 4 g/dL (ref 3.5–5.0)
Alkaline Phosphatase: 121 U/L (ref 38–126)
Anion gap: 12 (ref 5–15)
BUN: 8 mg/dL (ref 6–20)
CO2: 22 mmol/L (ref 22–32)
Calcium: 9.8 mg/dL (ref 8.9–10.3)
Chloride: 104 mmol/L (ref 98–111)
Creatinine: 0.79 mg/dL (ref 0.44–1.00)
GFR, Estimated: >60 mL/min (ref >60)
Glucose, Bld: 151 mg/dL — ABNORMAL HIGH (ref 70–99)
Potassium: 4 mmol/L (ref 3.5–5.1)
Sodium: 138 mmol/L (ref 135–145)
Total Bilirubin: <0.2 mg/dL — ABNORMAL LOW (ref 0.3–1.2)
Total Protein: 7.9 g/dL (ref 6.5–8.1)

## 2021-08-21 NOTE — Progress Notes (Signed)
[facility] OFFICE PROGRESS NOTE  Cyndi Bender, PA-C [place] [address]  DIAGNOSIS: Stage IV (T4, N2, M1 a) non-small cell lung cancer, adenocarcinoma presented with multifocal disease involving the right upper lobe, right lower lobe as well as suspicious lower paratracheal lymphadenopathy and groundglass nodules in the left lung diagnosed in May 2022. The patient had molecular studies performed by [facility] and that showed no detectable mutation but this is likely secondary to low-level circulating free tumor DNA.  PRIOR THERAPY: None  CURRENT THERAPY: Palliative systemic chemotherapy with carboplatin for AUC of 5, Alimta 500 Mg/M2 and Avastin 15 Mg/KG every 3 weeks.  The patient is not a great candidate for immunotherapy because of her history of active systemic lupus erythematosus.  She is status post 3 cycles.    INTERVAL HISTORY: Kirsten Williams 50 y.o. female returns to the clinic today for a follow-up visit.  The patient is feeling fairly well today without any concerning complaints except she had a rash with her last two treatments. She tried alcohol, a medrol dosepak, and witch hazel without significant improvement. She had improvement wit hydrocortisone cream. She also had some gas pain in the interval as well without any nausea, vomiting, diarrhea, or constipation. She cleaned the jewelry around her neck incase this was the cause of her rash around her neck.    She is currently undergoing systemic chemotherapy.  She is status post 3 cycles.  The patient has been tolerating treatment well without any concerning adverse effects fatigue the first week after treatment. She also feels like she has something squeezing around her diaphragm during the second week which resolves. She takes tylenol for pain. She reserves her Norco for if she does not have relief with tylenol. She does not take this every day. Then, she feels well the 2nd week after treatment.  She denies any recent fever, chills, or night sweats.  She has been followed by member the nutritionist team for her decreased appetite. She gained a few pounds today. She denies any significant shortness of breath or hemoptysis. No significant cough except for a mild dry cough the second week after treatment. She only had nausea/vomiting once with her last cycle of treatment.  She denies any abnormal bleeding or bruising except she has some gingival bleeding and mild specks of blood from her nose. She has a lot of dental concerns and sees a dentist. She notes she had visual blurring for a few days which resolved. She saw her PCP who believes she got some substances from her skin in her eye. She has a follow up with an eye doctor in December 2022. She has a few self limiting headaches which is not unusual for her. She takes tylenol if needed. The patient recently had a restaging CT scan performed.  She is here today for evaluation to review her scan results before starting cycle #4.   MEDICAL HISTORY: Past Medical History:  Diagnosis Date   Adenocarcinoma, lung, right ([place]) 05/03/2021   AALIJAH Williams is a 50 y.o. female with a history of lung nodules dating back to 2019 who is referred in consultation with Cyndi Bender, PA-C for assessment and management. She is a former smoker having quit in 2016. 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 She missed her screening CT last year and imaging was ordered last month. CT chest from 02-16-2021 reveals pro   Anxiety    GERD (gastroesophageal reflux  disease)    SLE (systemic lupus erythematosus) ([place]) 05/03/2021   SLE (systemic lupus erythematosus) ([place]) 05/03/2021    ALLERGIES:  has No Known Allergies.  MEDICATIONS:  Current Outpatient Medications  Medication Sig Dispense Refill   acetaminophen (TYLENOL) 325 MG tablet Take 975 mg by mouth every 6 (six) hours as needed for moderate pain or headache.     cyclobenzaprine  (FLEXERIL) 10 MG tablet Take 10 mg by mouth at bedtime as needed for muscle spasms.     dexamethasone (DECADRON) 4 MG tablet 4 mg p.o. twice daily the day before, day of and day after the chemotherapy every 3 weeks. 40 tablet 1   dicyclomine (BENTYL) 10 MG capsule Take 10 mg by mouth every 6 (six) hours as needed.     diphenhydrAMINE (BENADRYL) 25 MG tablet Take 25 mg by mouth daily as needed for allergies.     folic acid (FOLVITE) 1 MG tablet Take 1 tablet (1 mg total) by mouth daily. 30 tablet 4   HYDROcodone-acetaminophen (NORCO/VICODIN) 5-325 MG tablet Take 1 tablet by mouth every 6 (six) hours as needed for moderate pain. 30 tablet 0   hydrocortisone 1 % lotion Apply 1 application topically 2 (two) times daily. 118 mL 0   hydroxychloroquine (PLAQUENIL) 200 MG tablet Take 200 mg by mouth 2 (two) times daily.     Multiple Vitamins-Minerals (MULTI ADULT GUMMIES) CHEW Chew 2 tablets by mouth daily.     omeprazole (PRILOSEC) 40 MG capsule Take 40 mg by mouth daily as needed (acid reflux).     ondansetron (ZOFRAN) 4 MG tablet Take 8 mg by mouth 2 (two) times daily as needed for vomiting or nausea.     prochlorperazine (COMPAZINE) 10 MG tablet Take 1 tablet (10 mg total) by mouth every 6 (six) hours as needed. 30 tablet 2   rizatriptan (MAXALT-MLT) 10 MG disintegrating tablet Take 10 mg by mouth 2 (two) times daily as needed.     triamcinolone ointment (KENALOG) 0.5 % Apply topically 3 (three) times daily.     No current facility-administered medications for this visit.    SURGICAL HISTORY:  Past Surgical History:  Procedure Laterality Date   ABDOMINAL HYSTERECTOMY     BRONCHIAL BIOPSY  04/24/2021   Procedure: BRONCHIAL BIOPSIES;  Surgeon: Collene Gobble, MD;  Location: [facility] ENDOSCOPY;  Service: Pulmonary;;   BRONCHIAL BRUSHINGS  04/24/2021   Procedure: BRONCHIAL BRUSHINGS;  Surgeon: Collene Gobble, MD;  Location[facility] ENDOSCOPY;  Service: Pulmonary;;   BRONCHIAL NEEDLE ASPIRATION BIOPSY   04/24/2021   Procedure: BRONCHIAL NEEDLE ASPIRATION BIOPSIES;  Surgeon: Collene Gobble, MD;  Location: [facility];  Service: Pulmonary;;   BRONCHIAL WASHINGS  04/24/2021   Procedure: BRONCHIAL WASHINGS;  Surgeon: Collene Gobble, MD;  Location: [facility];  Service: Pulmonary;;   CESAREAN SECTION  11/09/1990   DILATION AND CURETTAGE OF UTERUS Bilateral 09/09/1996   FIDUCIAL MARKER PLACEMENT  04/24/2021   Procedure: FIDUCIAL MARKER PLACEMENT;  Surgeon: Collene Gobble, MD;  Location: [facility] ENDOSCOPY;  Service: Pulmonary;;   TONSILLECTOMY  12/10/1977   VIDEO BRONCHOSCOPY WITH ENDOBRONCHIAL NAVIGATION Bilateral 04/24/2021   Procedure: VIDEO BRONCHOSCOPY WITH ENDOBRONCHIAL NAVIGATION;  Surgeon: Collene Gobble, MD;  Location: [facility] ENDOSCOPY;  Service: Pulmonary;  Laterality: Bilateral;    REVIEW OF SYSTEMS:   Review of Systems  Constitutional: Positive for fatigue 1 week following treatment. Negative for appetite change, chills, fever and unexpected weight change.  HENT: Positive for gingival bleeding with bruising. Negative for mouth sores,  nosebleeds, sore throat and trouble swallowing.   Eyes: Negative for eye problems and icterus. Visual blurring improved.  Respiratory: Negative for cough, hemoptysis, shortness of breath and wheezing.   Cardiovascular: Negative for chest pain and leg swelling.  Gastrointestinal: Positive for gast pain (resolved). Negative for abdominal pain, constipation, diarrhea, nausea and vomiting.  Genitourinary: Negative for bladder incontinence, difficulty urinating, dysuria, frequency and hematuria.   Musculoskeletal: Negative for back pain, gait problem, neck pain and neck stiffness.  Skin: Positive for itching/rash around neck and face  Neurological: Negative for dizziness, extremity weakness, gait problem, headaches, light-headedness and seizures.  Hematological: Negative for adenopathy. Does not bruise/bleed easily.  Psychiatric/Behavioral: Negative for confusion,  depression and sleep disturbance. The patient is not nervous/anxious.     PHYSICAL EXAMINATION:  Blood pressure 125/80, pulse 86, temperature (!) 97.3 F (36.3 C), temperature source Tympanic, resp. rate 18, weight 116 lb (52.6 kg), SpO2 99 %.  ECOG PERFORMANCE STATUS: 1  Physical Exam  Constitutional: Oriented to person, place, and time and well-developed, well-nourished, and in no distress.  HENT:  Head: Normocephalic and atraumatic.  Mouth/Throat: Oropharynx is clear and moist. No oropharyngeal exudate.  Eyes: Conjunctivae are normal. Right eye exhibits no discharge. Left eye exhibits no discharge. No scleral icterus.  Neck: Normal range of motion. Neck supple.  Cardiovascular: Normal rate, regular rhythm, normal heart sounds and intact distal pulses.   Pulmonary/Chest: Effort normal and breath sounds normal. No respiratory distress. No wheezes. No rales.  Abdominal: Soft. Bowel sounds are normal. Exhibits no distension and no mass. There is no tenderness.  Musculoskeletal: Normal range of motion. Exhibits no edema.  Lymphadenopathy:    No cervical adenopathy.  Neurological: Alert and oriented to person, place, and time. Exhibits normal muscle tone. Gait normal. Coordination normal.  Skin: Positive for itching/rash. Skin is warm and dry. Not diaphoretic. No erythema. No pallor.  Psychiatric: Mood, memory and judgment normal.  Vitals reviewed.  LABORATORY DATA: Lab Results  Component Value Date   WBC 7.7 08/23/2021   HGB 11.9 (L) 08/23/2021   HCT 35.2 (L) 08/23/2021   MCV 94.1 08/23/2021   PLT 303 08/23/2021      Chemistry      Component Value Date/Time   NA 140 08/23/2021 0936   NA 140 03/13/2021 0000   K 3.9 08/23/2021 0936   CL 105 08/23/2021 0936   CO2 24 08/23/2021 0936   BUN 10 08/23/2021 0936   BUN 10 03/13/2021 0000   CREATININE 0.73 08/23/2021 0936   GLU 107 03/13/2021 0000      Component Value Date/Time   CALCIUM 9.6 08/23/2021 0936   ALKPHOS 102  08/23/2021 0936   AST 22 08/23/2021 0936   ALT 39 08/23/2021 0936   BILITOT <0.2 (L) 08/23/2021 0936       RADIOGRAPHIC STUDIES:  CT Chest W Contrast  Result Date: 08/22/2021 CLINICAL DATA:  Primary Cancer Type: Lung Imaging Indication: Assess response to therapy Interval therapy since last imaging? Yes Initial Cancer Diagnosis Date: 04/24/2021; Established by: Biopsy-proven Detailed Pathology: Stage IV non-small cell lung cancer, adenocarcinoma. Primary Tumor location: Right upper and right lower lobe. Surgeries: Hysterectomy. Chemotherapy: Yes; Ongoing? Yes; Most recent administration: 08/02/2021 Immunotherapy? No Radiation therapy? No EXAM: CT CHEST, ABDOMEN, AND PELVIS WITH CONTRAST TECHNIQUE: Multidetector CT imaging of the chest, abdomen and pelvis was performed following the standard protocol during bolus administration of intravenous contrast. CONTRAST:  31mL OMNIPAQUE IOHEXOL 350 MG/ML SOLN COMPARISON:  03/06/2021 PET-CT.  Most recent CT chest  04/18/2021. FINDINGS: CT CHEST FINDINGS Cardiovascular: Normal heart size. No significant pericardial effusion/thickening. Atherosclerotic nonaneurysmal thoracic aorta. Normal caliber pulmonary arteries. No central pulmonary emboli. Mediastinum/Nodes: No discrete thyroid nodules. Unremarkable esophagus. No axillary adenopathy. Right paratracheal 0.9 cm top-normal node (series 2/image 22), minimally decreased from 1.0 cm. No pathologically enlarged mediastinal or hilar nodes. Lungs/Pleura: No pneumothorax. No pleural effusion. Sub solid 1.0 x 0.9 cm posterior right upper lobe nodule (series 6/image 31), slightly decreased in size from 1.2 x 0.9 cm on 04/18/2021 chest CT and slightly decreased in density. Sub solid superior segment right lower lobe 1.6 x 1.2 cm nodule (series 6/image 56), slightly decreased from 1.9 x 1.3 cm using similar measurement technique and slightly decreased in density. Several ground-glass pulmonary nodules scattered throughout the  left lung, most prominent in the left upper lobe, largest 2.1 cm in the peripheral left upper lobe (series 6/image 30), previously 2.5 cm, mildly decreased. Biopsy clips noted in the upper lobes bilaterally. No acute consolidative airspace disease, lung masses or new significant pulmonary nodules. Musculoskeletal: No aggressive appearing focal osseous lesions. Mild thoracic spondylosis. CT ABDOMEN PELVIS FINDINGS Hepatobiliary: Normal liver with no liver mass. Normal gallbladder with no radiopaque cholelithiasis. No biliary ductal dilatation. Pancreas: Normal, with no mass or duct dilation. Spleen: Normal size. No mass. Adrenals/Urinary Tract: Stable 2.3 cm right adrenal and 2.3 cm left adrenal nodules, previously characterized as benign adenomas. Normal kidneys with no hydronephrosis and no renal mass. Normal bladder. Stomach/Bowel: Normal non-distended stomach. Normal caliber small bowel with no small bowel wall thickening. Normal appendix. Oral contrast transits to the distal colon. Normal large bowel with no diverticulosis, large bowel wall thickening or pericolonic fat stranding. Vascular/Lymphatic: Atherosclerotic nonaneurysmal abdominal aorta. Patent portal, splenic, hepatic and renal veins. No pathologically enlarged lymph nodes in the abdomen or pelvis. Reproductive: Status post hysterectomy, with no abnormal findings at the vaginal cuff. No adnexal mass. Other: No pneumoperitoneum, ascites or focal fluid collection. Musculoskeletal: No aggressive appearing focal osseous lesions. Mild lumbar spondylosis. IMPRESSION: 1. Subsolid right pulmonary nodules are mildly decreased in size and density. Ground-glass left pulmonary nodules are mildly decreased in size. Previously mildly enlarged right paratracheal lymph node is slightly decreased. Findings are compatible with positive response to therapy. 2. No new or progressive metastatic disease in the chest. 3. No evidence of metastatic disease in the abdomen or  pelvis. 4. Stable bilateral adrenal adenomas. 5. Aortic Atherosclerosis (ICD10-I70.0). Electronically Signed   By: Ilona Sorrel M.D.   On: 08/22/2021 11:15   CT Abdomen Pelvis W Contrast  Result Date: 08/22/2021 CLINICAL DATA:  Primary Cancer Type: Lung Imaging Indication: Assess response to therapy Interval therapy since last imaging? Yes Initial Cancer Diagnosis Date: 04/24/2021; Established by: Biopsy-proven Detailed Pathology: Stage IV non-small cell lung cancer, adenocarcinoma. Primary Tumor location: Right upper and right lower lobe. Surgeries: Hysterectomy. Chemotherapy: Yes; Ongoing? Yes; Most recent administration: 08/02/2021 Immunotherapy? No Radiation therapy? No EXAM: CT CHEST, ABDOMEN, AND PELVIS WITH CONTRAST TECHNIQUE: Multidetector CT imaging of the chest, abdomen and pelvis was performed following the standard protocol during bolus administration of intravenous contrast. CONTRAST:  4mL OMNIPAQUE IOHEXOL 350 MG/ML SOLN COMPARISON:  03/06/2021 PET-CT.  Most recent CT chest 04/18/2021. FINDINGS: CT CHEST FINDINGS Cardiovascular: Normal heart size. No significant pericardial effusion/thickening. Atherosclerotic nonaneurysmal thoracic aorta. Normal caliber pulmonary arteries. No central pulmonary emboli. Mediastinum/Nodes: No discrete thyroid nodules. Unremarkable esophagus. No axillary adenopathy. Right paratracheal 0.9 cm top-normal node (series 2/image 22), minimally decreased from 1.0 cm. No pathologically enlarged mediastinal  or hilar nodes. Lungs/Pleura: No pneumothorax. No pleural effusion. Sub solid 1.0 x 0.9 cm posterior right upper lobe nodule (series 6/image 31), slightly decreased in size from 1.2 x 0.9 cm on 04/18/2021 chest CT and slightly decreased in density. Sub solid superior segment right lower lobe 1.6 x 1.2 cm nodule (series 6/image 56), slightly decreased from 1.9 x 1.3 cm using similar measurement technique and slightly decreased in density. Several ground-glass pulmonary  nodules scattered throughout the left lung, most prominent in the left upper lobe, largest 2.1 cm in the peripheral left upper lobe (series 6/image 30), previously 2.5 cm, mildly decreased. Biopsy clips noted in the upper lobes bilaterally. No acute consolidative airspace disease, lung masses or new significant pulmonary nodules. Musculoskeletal: No aggressive appearing focal osseous lesions. Mild thoracic spondylosis. CT ABDOMEN PELVIS FINDINGS Hepatobiliary: Normal liver with no liver mass. Normal gallbladder with no radiopaque cholelithiasis. No biliary ductal dilatation. Pancreas: Normal, with no mass or duct dilation. Spleen: Normal size. No mass. Adrenals/Urinary Tract: Stable 2.3 cm right adrenal and 2.3 cm left adrenal nodules, previously characterized as benign adenomas. Normal kidneys with no hydronephrosis and no renal mass. Normal bladder. Stomach/Bowel: Normal non-distended stomach. Normal caliber small bowel with no small bowel wall thickening. Normal appendix. Oral contrast transits to the distal colon. Normal large bowel with no diverticulosis, large bowel wall thickening or pericolonic fat stranding. Vascular/Lymphatic: Atherosclerotic nonaneurysmal abdominal aorta. Patent portal, splenic, hepatic and renal veins. No pathologically enlarged lymph nodes in the abdomen or pelvis. Reproductive: Status post hysterectomy, with no abnormal findings at the vaginal cuff. No adnexal mass. Other: No pneumoperitoneum, ascites or focal fluid collection. Musculoskeletal: No aggressive appearing focal osseous lesions. Mild lumbar spondylosis. IMPRESSION: 1. Subsolid right pulmonary nodules are mildly decreased in size and density. Ground-glass left pulmonary nodules are mildly decreased in size. Previously mildly enlarged right paratracheal lymph node is slightly decreased. Findings are compatible with positive response to therapy. 2. No new or progressive metastatic disease in the chest. 3. No evidence of  metastatic disease in the abdomen or pelvis. 4. Stable bilateral adrenal adenomas. 5. Aortic Atherosclerosis (ICD10-I70.0). Electronically Signed   By: Ilona Sorrel M.D.   On: 08/22/2021 11:15     ASSESSMENT/PLAN:  This is a very pleasant 50 year old Caucasian female recently diagnosed with stage IV (T4, N2, M1 a) non-small cell lung cancer, adenocarcinoma presented with multifocal disease involving the right upper lobe, right lower lobe as well as suspicious lower paratracheal lymphadenopathy and groundglass nodules in the left lung diagnosed in May 2022. The patient had molecular studies performed by [facility] and that showed no detectable mutation but this is likely secondary to low-level circulating free tumor DNA.   Since the patient has a history of systemic lupus erythematosus, she is not a candidate for immunotherapy.    The patient is currently undergoing systemic chemotherapy with carboplatin for an AUC 5, Alimta 500 mg per metered square, and and Avastin 1500 mg/kg IV every 3 weeks.  She is status post 3 cycles.  The patient recently had a restaging CT scan performed.  Dr. Julien Nordmann personally and independently reviewed the scan and discussed the results with the patient today.  The scan showed no evidence for disease progression recommend that she proceed with cycle #4 today scheduled.   We will see her back for follow-up visit in 3 weeks for evaluation before starting cycle #5.  She will continue to use hydrocortisone cream for her rash.   She was instructed to have good  dental hygiene for her bleeding gums and continue to follow with her dentist. She also was advised to use saline spray for her nose.   Advised to use gas x if needed for gas pain.   She will continue to use tylenol or norco if needed for pain.   Her last urine protein was performed on 08/09/21 which was negative. Ok to use that urine protein today. Typically, we would want this to be done on the day of treatment.    She is a challenging stick but she is not interested in a port-a-cath at this time.   The patient was advised to call immediately if she has any concerning symptoms in the interval. The patient voices understanding of current disease status and treatment options and is in agreement with the current care plan. All questions were answered. The patient knows to call the clinic with any problems, questions or concerns. We can certainly see the patient much sooner if necessary  No orders of the defined types were placed in this encounter.     Orla Jolliff L Kama Cammarano, PA-C 08/23/21  ADDENDUM: Hematology/Oncology Attending: I had a face-to-face encounter with the patient today.  I reviewed her records, lab, scan and recommended her care plan.  This is a very pleasant 50 years old white female diagnosed with a stage IV non-small cell lung cancer, adenocarcinoma presented with multifocal disease in addition to mediastinal lymphadenopathy diagnosed and May 2022. The patient has a history of systemic lupus erythematosus and she is not a candidate for immunotherapy. She started systemic chemotherapy with carboplatin for AUC of 5, Alimta 500 Mg/M2 and Avastin 15 Mg/KG every 3 weeks status post 3 cycles.  The patient has been tolerating her treatment well except for mild skin rash especially on the chest area and back.  She is applying hydrocortisone cream to these areas.  She was treated with Medrol dose pack but no significant improvement. She had repeat CT scan of the chest, abdomen pelvis performed recently.  I personally and independently reviewed the scans and discussed the results with the patient today. Her scan showed no concerning findings for disease progression and there was some mild improvement in some of the primary lesions. I recommended for the patient to continue her current treatment with the same regimen carboplatin, Alimta and Avastin for 3 more cycles before switching to maintenance  treatment with Alimta and Avastin every 3 weeks starting from cycle #7. The patient is in agreement with the current plan. She will come back for follow-up visit in 3 weeks for evaluation before starting cycle #5. The patient was advised to call immediately if she has any other concerning symptoms in the interval. The total time spent in the appointment was 35 minutes. Disclaimer: This note was dictated with voice recognition software. Similar sounding words can inadvertently be transcribed and may be missed upon review. Eilleen Kempf, MD 08/23/21

## 2021-08-22 ENCOUNTER — Other Ambulatory Visit: Payer: Self-pay

## 2021-08-22 ENCOUNTER — Ambulatory Visit (HOSPITAL_COMMUNITY)
Admission: RE | Admit: 2021-08-22 | Discharge: 2021-08-22 | Disposition: A | Discharge status: Home / Self Care | Payer: Commercial Managed Care - PPO | Source: Ambulatory Visit | Attending: Internal Medicine | Admitting: Internal Medicine

## 2021-08-22 ENCOUNTER — Encounter (HOSPITAL_COMMUNITY): Payer: Self-pay

## 2021-08-22 DIAGNOSIS — C3491 Malignant neoplasm of unspecified part of right bronchus or lung: Secondary | ICD-10-CM | POA: Insufficient documentation

## 2021-08-22 DIAGNOSIS — C3490 Malignant neoplasm of unspecified part of unspecified bronchus or lung: Secondary | ICD-10-CM | POA: Diagnosis present

## 2021-08-22 MED ORDER — IOHEXOL 350 MG/ML SOLN
75.0000 mL | Freq: Once | INTRAVENOUS | Status: AC | PRN
  Administered 2021-08-22: 75 mL via INTRAVENOUS

## 2021-08-23 ENCOUNTER — Inpatient Hospital Stay: Payer: Commercial Managed Care - PPO | Admitting: Physician Assistant

## 2021-08-23 ENCOUNTER — Inpatient Hospital Stay: Payer: Commercial Managed Care - PPO

## 2021-08-23 VITALS — BP 125/80 | HR 86 | Temp 97.3°F | Resp 18 | Wt 116.0 lb

## 2021-08-23 DIAGNOSIS — Z5112 Encounter for antineoplastic immunotherapy: Secondary | ICD-10-CM | POA: Diagnosis not present

## 2021-08-23 DIAGNOSIS — C3491 Malignant neoplasm of unspecified part of right bronchus or lung: Secondary | ICD-10-CM | POA: Diagnosis not present

## 2021-08-23 DIAGNOSIS — Z5111 Encounter for antineoplastic chemotherapy: Secondary | ICD-10-CM

## 2021-08-23 LAB — CMP (CANCER CENTER ONLY)
ALT: 39 U/L (ref 0–44)
AST: 22 U/L (ref 15–41)
Albumin: 3.9 g/dL (ref 3.5–5.0)
Alkaline Phosphatase: 102 U/L (ref 38–126)
Anion gap: 11 (ref 5–15)
BUN: 10 mg/dL (ref 6–20)
CO2: 24 mmol/L (ref 22–32)
Calcium: 9.6 mg/dL (ref 8.9–10.3)
Chloride: 105 mmol/L (ref 98–111)
Creatinine: 0.73 mg/dL (ref 0.44–1.00)
GFR, Estimated: >60 mL/min (ref >60)
Glucose, Bld: 165 mg/dL — ABNORMAL HIGH (ref 70–99)
Potassium: 3.9 mmol/L (ref 3.5–5.1)
Sodium: 140 mmol/L (ref 135–145)
Total Bilirubin: <0.2 mg/dL — ABNORMAL LOW (ref 0.3–1.2)
Total Protein: 7.4 g/dL (ref 6.5–8.1)

## 2021-08-23 LAB — CBC WITH DIFFERENTIAL (CANCER CENTER ONLY)
Abs Immature Granulocytes: 0.05 10*3/uL (ref 0.00–0.07)
Basophils Absolute: 0 10*3/uL (ref 0.0–0.1)
Basophils Relative: 0 %
Eosinophils Absolute: 0 10*3/uL (ref 0.0–0.5)
Eosinophils Relative: 0 %
HCT: 35.2 % — ABNORMAL LOW (ref 36.0–46.0)
Hemoglobin: 11.9 g/dL — ABNORMAL LOW (ref 12.0–15.0)
Immature Granulocytes: 1 %
Lymphocytes Relative: 12 %
Lymphs Abs: 1 10*3/uL (ref 0.7–4.0)
MCH: 31.8 pg (ref 26.0–34.0)
MCHC: 33.8 g/dL (ref 30.0–36.0)
MCV: 94.1 fL (ref 80.0–100.0)
Monocytes Absolute: 0.4 10*3/uL (ref 0.1–1.0)
Monocytes Relative: 5 %
Neutro Abs: 6.3 10*3/uL (ref 1.7–7.7)
Neutrophils Relative %: 82 %
Platelet Count: 303 10*3/uL (ref 150–400)
RBC: 3.74 MIL/uL — ABNORMAL LOW (ref 3.87–5.11)
RDW: 15.9 % — ABNORMAL HIGH (ref 11.5–15.5)
WBC Count: 7.7 10*3/uL (ref 4.0–10.5)
nRBC: 0 % (ref 0.0–0.2)

## 2021-08-23 MED ORDER — SODIUM CHLORIDE 0.9 % IV SOLN
10.0000 mg | Freq: Once | INTRAVENOUS | Status: AC
  Administered 2021-08-23: 10 mg via INTRAVENOUS
  Filled 2021-08-23: qty 10

## 2021-08-23 MED ORDER — SODIUM CHLORIDE 0.9 % IV SOLN: Freq: Once | INTRAVENOUS | Status: AC

## 2021-08-23 MED ORDER — PALONOSETRON HCL INJECTION 0.25 MG/5ML
0.2500 mg | Freq: Once | INTRAVENOUS | Status: AC
  Administered 2021-08-23: 0.25 mg via INTRAVENOUS
  Filled 2021-08-23: qty 5

## 2021-08-23 MED ORDER — SODIUM CHLORIDE 0.9 % IV SOLN
15.0000 mg/kg | Freq: Once | INTRAVENOUS | Status: AC
  Administered 2021-08-23: 800 mg via INTRAVENOUS
  Filled 2021-08-23: qty 32

## 2021-08-23 MED ORDER — SODIUM CHLORIDE 0.9 % IV SOLN
475.5000 mg | Freq: Once | INTRAVENOUS | Status: AC
  Administered 2021-08-23: 480 mg via INTRAVENOUS
  Filled 2021-08-23: qty 48

## 2021-08-23 MED ORDER — CYANOCOBALAMIN 1000 MCG/ML IJ SOLN
1000.0000 ug | Freq: Once | INTRAMUSCULAR | Status: AC
  Administered 2021-08-23: 1000 ug via INTRAMUSCULAR
  Filled 2021-08-23: qty 1

## 2021-08-23 MED ORDER — SODIUM CHLORIDE 0.9 % IV SOLN
500.0000 mg/m2 | Freq: Once | INTRAVENOUS | Status: AC
  Administered 2021-08-23: 800 mg via INTRAVENOUS
  Filled 2021-08-23: qty 12

## 2021-08-23 MED ORDER — SODIUM CHLORIDE 0.9 % IV SOLN
150.0000 mg | Freq: Once | INTRAVENOUS | Status: AC
  Administered 2021-08-23: 150 mg via INTRAVENOUS
  Filled 2021-08-23: qty 150

## 2021-08-23 NOTE — Patient Instructions (Signed)
[facility]  Discharge Instructions: Thank you for choosing [facility] to provide your oncology and hematology care.   If you have a lab appointment with the [facility], please go directly to the [facility] and check in at the registration area.   Wear comfortable clothing and clothing appropriate for easy access to any Portacath or PICC line.   We strive to give you quality time with your provider. You may need to reschedule your appointment if you arrive late (15 or more minutes).  Arriving late affects you and other patients whose appointments are after yours.  Also, if you miss three or more appointments without notifying the office, you may be dismissed from the clinic at the provider's discretion.      For prescription refill requests, have your pharmacy contact our office and allow 72 hours for refills to be completed.    Today you received the following chemotherapy and/or immunotherapy agents :  Bevacizumab, Pemetrexed, & Carboplatin.       To help prevent nausea and vomiting after your treatment, we encourage you to take your nausea medication as directed.  BELOW ARE SYMPTOMS THAT SHOULD BE REPORTED IMMEDIATELY: *FEVER GREATER THAN 100.4 F (38 C) OR HIGHER *CHILLS OR SWEATING *NAUSEA AND VOMITING THAT IS NOT CONTROLLED WITH YOUR NAUSEA MEDICATION *UNUSUAL SHORTNESS OF BREATH *UNUSUAL BRUISING OR BLEEDING *URINARY PROBLEMS (pain or burning when urinating, or frequent urination) *BOWEL PROBLEMS (unusual diarrhea, constipation, pain near the anus) TENDERNESS IN MOUTH AND THROAT WITH OR WITHOUT PRESENCE OF ULCERS (sore throat, sores in mouth, or a toothache) UNUSUAL RASH, SWELLING OR PAIN  UNUSUAL VAGINAL DISCHARGE OR ITCHING   Items with * indicate a potential emergency and should be followed up as soon as possible or go to the Emergency Department if any problems should occur.  Please show the CHEMOTHERAPY ALERT CARD or  IMMUNOTHERAPY ALERT CARD at check-in to the Emergency Department and triage nurse.  Should you have questions after your visit or need to cancel or reschedule your appointment, please contact [facility]  Dept: [phone]  and follow the prompts.  Office hours are 8:00 a.m. to 4:30 p.m. Monday - Friday. Please note that voicemails left after 4:00 p.m. may not be returned until the following business day.  We are closed weekends and major holidays. You have access to a nurse at all times for urgent questions. Please call the main number to the clinic Dept: [phone] and follow the prompts.   For any non-urgent questions, you may also contact your provider using MyChart. We now offer e-Visits for anyone 40 and older to request care online for non-urgent symptoms. For details visit [email or web address].   Also download the MyChart app! Go to the app store, search "MyChart", open the app, select [facility], and log in with your MyChart username and password.  Due to Covid, a mask is required upon entering the hospital/clinic. If you do not have a mask, one will be given to you upon arrival. For doctor visits, patients may have 1 support person aged 104 or older with them. For treatment visits, patients cannot have anyone with them due to current Covid guidelines and our immunocompromised population.

## 2021-08-30 ENCOUNTER — Inpatient Hospital Stay: Payer: Commercial Managed Care - PPO

## 2021-08-30 ENCOUNTER — Other Ambulatory Visit: Payer: Self-pay

## 2021-08-30 DIAGNOSIS — C3491 Malignant neoplasm of unspecified part of right bronchus or lung: Secondary | ICD-10-CM

## 2021-08-30 DIAGNOSIS — Z5112 Encounter for antineoplastic immunotherapy: Secondary | ICD-10-CM | POA: Diagnosis not present

## 2021-08-30 LAB — CBC WITH DIFFERENTIAL (CANCER CENTER ONLY)
Abs Immature Granulocytes: 0.03 10*3/uL (ref 0.00–0.07)
Basophils Absolute: 0 10*3/uL (ref 0.0–0.1)
Basophils Relative: 0 %
Eosinophils Absolute: 0 10*3/uL (ref 0.0–0.5)
Eosinophils Relative: 0 %
HCT: 35.9 % — ABNORMAL LOW (ref 36.0–46.0)
Hemoglobin: 12.3 g/dL (ref 12.0–15.0)
Immature Granulocytes: 1 %
Lymphocytes Relative: 17 %
Lymphs Abs: 0.8 10*3/uL (ref 0.7–4.0)
MCH: 31.7 pg (ref 26.0–34.0)
MCHC: 34.3 g/dL (ref 30.0–36.0)
MCV: 92.5 fL (ref 80.0–100.0)
Monocytes Absolute: 0.2 10*3/uL (ref 0.1–1.0)
Monocytes Relative: 4 %
Neutro Abs: 4 10*3/uL (ref 1.7–7.7)
Neutrophils Relative %: 78 %
Platelet Count: 189 10*3/uL (ref 150–400)
RBC: 3.88 MIL/uL (ref 3.87–5.11)
RDW: 15 % (ref 11.5–15.5)
WBC Count: 5.1 10*3/uL (ref 4.0–10.5)
nRBC: 0 % (ref 0.0–0.2)

## 2021-08-30 LAB — CMP (CANCER CENTER ONLY)
ALT: 43 U/L (ref 0–44)
AST: 28 U/L (ref 15–41)
Albumin: 4 g/dL (ref 3.5–5.0)
Alkaline Phosphatase: 84 U/L (ref 38–126)
Anion gap: 10 (ref 5–15)
BUN: 12 mg/dL (ref 6–20)
CO2: 24 mmol/L (ref 22–32)
Calcium: 9.6 mg/dL (ref 8.9–10.3)
Chloride: 105 mmol/L (ref 98–111)
Creatinine: 0.77 mg/dL (ref 0.44–1.00)
GFR, Estimated: >60 mL/min (ref >60)
Glucose, Bld: 110 mg/dL — ABNORMAL HIGH (ref 70–99)
Potassium: 4.5 mmol/L (ref 3.5–5.1)
Sodium: 139 mmol/L (ref 135–145)
Total Bilirubin: 0.3 mg/dL (ref 0.3–1.2)
Total Protein: 7.4 g/dL (ref 6.5–8.1)

## 2021-09-06 ENCOUNTER — Other Ambulatory Visit: Payer: Self-pay

## 2021-09-06 ENCOUNTER — Inpatient Hospital Stay: Payer: Commercial Managed Care - PPO

## 2021-09-06 DIAGNOSIS — C3491 Malignant neoplasm of unspecified part of right bronchus or lung: Secondary | ICD-10-CM

## 2021-09-06 DIAGNOSIS — Z5112 Encounter for antineoplastic immunotherapy: Secondary | ICD-10-CM | POA: Diagnosis not present

## 2021-09-06 LAB — CBC WITH DIFFERENTIAL (CANCER CENTER ONLY)
Abs Immature Granulocytes: 0.02 10*3/uL (ref 0.00–0.07)
Basophils Absolute: 0 10*3/uL (ref 0.0–0.1)
Basophils Relative: 0 %
Eosinophils Absolute: 0 10*3/uL (ref 0.0–0.5)
Eosinophils Relative: 1 %
HCT: 35.6 % — ABNORMAL LOW (ref 36.0–46.0)
Hemoglobin: 12 g/dL (ref 12.0–15.0)
Immature Granulocytes: 0 %
Lymphocytes Relative: 26 %
Lymphs Abs: 1.7 10*3/uL (ref 0.7–4.0)
MCH: 32.2 pg (ref 26.0–34.0)
MCHC: 33.7 g/dL (ref 30.0–36.0)
MCV: 95.4 fL (ref 80.0–100.0)
Monocytes Absolute: 0.4 10*3/uL (ref 0.1–1.0)
Monocytes Relative: 6 %
Neutro Abs: 4.6 10*3/uL (ref 1.7–7.7)
Neutrophils Relative %: 67 %
Platelet Count: 132 10*3/uL — ABNORMAL LOW (ref 150–400)
RBC: 3.73 MIL/uL — ABNORMAL LOW (ref 3.87–5.11)
RDW: 16.4 % — ABNORMAL HIGH (ref 11.5–15.5)
WBC Count: 6.8 10*3/uL (ref 4.0–10.5)
nRBC: 0 % (ref 0.0–0.2)

## 2021-09-06 LAB — CMP (CANCER CENTER ONLY)
ALT: 181 U/L — ABNORMAL HIGH (ref 0–44)
AST: 68 U/L — ABNORMAL HIGH (ref 15–41)
Albumin: 3.9 g/dL (ref 3.5–5.0)
Alkaline Phosphatase: 119 U/L (ref 38–126)
Anion gap: 13 (ref 5–15)
BUN: 7 mg/dL (ref 6–20)
CO2: 22 mmol/L (ref 22–32)
Calcium: 9.9 mg/dL (ref 8.9–10.3)
Chloride: 105 mmol/L (ref 98–111)
Creatinine: 0.7 mg/dL (ref 0.44–1.00)
GFR, Estimated: >60 mL/min (ref >60)
Glucose, Bld: 158 mg/dL — ABNORMAL HIGH (ref 70–99)
Potassium: 3.8 mmol/L (ref 3.5–5.1)
Sodium: 140 mmol/L (ref 135–145)
Total Bilirubin: <0.2 mg/dL — ABNORMAL LOW (ref 0.3–1.2)
Total Protein: 7.6 g/dL (ref 6.5–8.1)

## 2021-09-13 ENCOUNTER — Inpatient Hospital Stay: Payer: Commercial Managed Care - PPO

## 2021-09-13 ENCOUNTER — Inpatient Hospital Stay: Payer: Commercial Managed Care - PPO | Admitting: Internal Medicine

## 2021-09-13 ENCOUNTER — Other Ambulatory Visit: Payer: Self-pay

## 2021-09-13 ENCOUNTER — Encounter: Payer: Self-pay | Admitting: Internal Medicine

## 2021-09-13 ENCOUNTER — Inpatient Hospital Stay: Payer: Commercial Managed Care - PPO | Attending: Hematology and Oncology

## 2021-09-13 VITALS — BP 118/68 | HR 97 | Temp 97.2°F | Resp 19 | Ht 61.0 in | Wt 117.6 lb

## 2021-09-13 DIAGNOSIS — C3491 Malignant neoplasm of unspecified part of right bronchus or lung: Secondary | ICD-10-CM | POA: Diagnosis not present

## 2021-09-13 DIAGNOSIS — Z5111 Encounter for antineoplastic chemotherapy: Secondary | ICD-10-CM | POA: Diagnosis present

## 2021-09-13 DIAGNOSIS — Z5112 Encounter for antineoplastic immunotherapy: Secondary | ICD-10-CM | POA: Diagnosis not present

## 2021-09-13 DIAGNOSIS — C3411 Malignant neoplasm of upper lobe, right bronchus or lung: Secondary | ICD-10-CM | POA: Insufficient documentation

## 2021-09-13 DIAGNOSIS — M329 Systemic lupus erythematosus, unspecified: Secondary | ICD-10-CM | POA: Insufficient documentation

## 2021-09-13 LAB — CBC WITH DIFFERENTIAL (CANCER CENTER ONLY)
Abs Immature Granulocytes: 0.02 10*3/uL (ref 0.00–0.07)
Basophils Absolute: 0 10*3/uL (ref 0.0–0.1)
Basophils Relative: 0 %
Eosinophils Absolute: 0 10*3/uL (ref 0.0–0.5)
Eosinophils Relative: 0 %
HCT: 36.5 % (ref 36.0–46.0)
Hemoglobin: 12 g/dL (ref 12.0–15.0)
Immature Granulocytes: 0 %
Lymphocytes Relative: 20 %
Lymphs Abs: 1.4 10*3/uL (ref 0.7–4.0)
MCH: 32.2 pg (ref 26.0–34.0)
MCHC: 32.9 g/dL (ref 30.0–36.0)
MCV: 97.9 fL (ref 80.0–100.0)
Monocytes Absolute: 0.5 10*3/uL (ref 0.1–1.0)
Monocytes Relative: 7 %
Neutro Abs: 4.8 10*3/uL (ref 1.7–7.7)
Neutrophils Relative %: 73 %
Platelet Count: 334 10*3/uL (ref 150–400)
RBC: 3.73 MIL/uL — ABNORMAL LOW (ref 3.87–5.11)
RDW: 17.2 % — ABNORMAL HIGH (ref 11.5–15.5)
WBC Count: 6.7 10*3/uL (ref 4.0–10.5)
nRBC: 0 % (ref 0.0–0.2)

## 2021-09-13 LAB — CMP (CANCER CENTER ONLY)
ALT: 63 U/L — ABNORMAL HIGH (ref 0–44)
AST: 21 U/L (ref 15–41)
Albumin: 4.1 g/dL (ref 3.5–5.0)
Alkaline Phosphatase: 114 U/L (ref 38–126)
Anion gap: 14 (ref 5–15)
BUN: 7 mg/dL (ref 6–20)
CO2: 20 mmol/L — ABNORMAL LOW (ref 22–32)
Calcium: 9.7 mg/dL (ref 8.9–10.3)
Chloride: 106 mmol/L (ref 98–111)
Creatinine: 0.78 mg/dL (ref 0.44–1.00)
GFR, Estimated: >60 mL/min (ref >60)
Glucose, Bld: 153 mg/dL — ABNORMAL HIGH (ref 70–99)
Potassium: 3.6 mmol/L (ref 3.5–5.1)
Sodium: 140 mmol/L (ref 135–145)
Total Bilirubin: <0.2 mg/dL — ABNORMAL LOW (ref 0.3–1.2)
Total Protein: 7.6 g/dL (ref 6.5–8.1)

## 2021-09-13 LAB — TOTAL PROTEIN, URINE DIPSTICK: Protein, ur: NEGATIVE mg/dL

## 2021-09-13 MED ORDER — SODIUM CHLORIDE 0.9 % IV SOLN
15.0000 mg/kg | Freq: Once | INTRAVENOUS | Status: AC
  Administered 2021-09-13: 800 mg via INTRAVENOUS
  Filled 2021-09-13: qty 32

## 2021-09-13 MED ORDER — SODIUM CHLORIDE 0.9 % IV SOLN: Freq: Once | INTRAVENOUS | Status: AC

## 2021-09-13 MED ORDER — SODIUM CHLORIDE 0.9 % IV SOLN
10.0000 mg | Freq: Once | INTRAVENOUS | Status: AC
  Administered 2021-09-13: 10 mg via INTRAVENOUS
  Filled 2021-09-13: qty 10

## 2021-09-13 MED ORDER — SODIUM CHLORIDE 0.9 % IV SOLN
150.0000 mg | Freq: Once | INTRAVENOUS | Status: AC
  Administered 2021-09-13: 150 mg via INTRAVENOUS
  Filled 2021-09-13: qty 150

## 2021-09-13 MED ORDER — PALONOSETRON HCL INJECTION 0.25 MG/5ML
0.2500 mg | Freq: Once | INTRAVENOUS | Status: AC
  Administered 2021-09-13: 0.25 mg via INTRAVENOUS
  Filled 2021-09-13: qty 5

## 2021-09-13 MED ORDER — SODIUM CHLORIDE 0.9 % IV SOLN
470.0000 mg | Freq: Once | INTRAVENOUS | Status: AC
  Administered 2021-09-13: 470 mg via INTRAVENOUS
  Filled 2021-09-13: qty 47

## 2021-09-13 MED ORDER — SODIUM CHLORIDE 0.9 % IV SOLN
500.0000 mg/m2 | Freq: Once | INTRAVENOUS | Status: AC
  Administered 2021-09-13: 800 mg via INTRAVENOUS
  Filled 2021-09-13: qty 20

## 2021-09-13 NOTE — Patient Instructions (Signed)
[facility]  Discharge Instructions: Thank you for choosing [facility] to provide your oncology and hematology care.   If you have a lab appointment with the [facility], please go directly to the [facility] and check in at the registration area.   Wear comfortable clothing and clothing appropriate for easy access to any Portacath or PICC line.   We strive to give you quality time with your provider. You may need to reschedule your appointment if you arrive late (15 or more minutes).  Arriving late affects you and other patients whose appointments are after yours.  Also, if you miss three or more appointments without notifying the office, you may be dismissed from the clinic at the provider's discretion.      For prescription refill requests, have your pharmacy contact our office and allow 72 hours for refills to be completed.    Today you received the following chemotherapy and/or immunotherapy agents :  Bevacizumab, Pemetrexed, & Carboplatin.       To help prevent nausea and vomiting after your treatment, we encourage you to take your nausea medication as directed.  BELOW ARE SYMPTOMS THAT SHOULD BE REPORTED IMMEDIATELY: *FEVER GREATER THAN 100.4 F (38 C) OR HIGHER *CHILLS OR SWEATING *NAUSEA AND VOMITING THAT IS NOT CONTROLLED WITH YOUR NAUSEA MEDICATION *UNUSUAL SHORTNESS OF BREATH *UNUSUAL BRUISING OR BLEEDING *URINARY PROBLEMS (pain or burning when urinating, or frequent urination) *BOWEL PROBLEMS (unusual diarrhea, constipation, pain near the anus) TENDERNESS IN MOUTH AND THROAT WITH OR WITHOUT PRESENCE OF ULCERS (sore throat, sores in mouth, or a toothache) UNUSUAL RASH, SWELLING OR PAIN  UNUSUAL VAGINAL DISCHARGE OR ITCHING   Items with * indicate a potential emergency and should be followed up as soon as possible or go to the Emergency Department if any problems should occur.  Please show the CHEMOTHERAPY ALERT CARD or  IMMUNOTHERAPY ALERT CARD at check-in to the Emergency Department and triage nurse.  Should you have questions after your visit or need to cancel or reschedule your appointment, please contact [facility]  Dept: [phone]  and follow the prompts.  Office hours are 8:00 a.m. to 4:30 p.m. Monday - Friday. Please note that voicemails left after 4:00 p.m. may not be returned until the following business day.  We are closed weekends and major holidays. You have access to a nurse at all times for urgent questions. Please call the main number to the clinic Dept: [phone] and follow the prompts.   For any non-urgent questions, you may also contact your provider using MyChart. We now offer e-Visits for anyone 46 and older to request care online for non-urgent symptoms. For details visit [email or web address].   Also download the MyChart app! Go to the app store, search "MyChart", open the app, select [facility], and log in with your MyChart username and password.  Due to Covid, a mask is required upon entering the hospital/clinic. If you do not have a mask, one will be given to you upon arrival. For doctor visits, patients may have 1 support person aged 34 or older with them. For treatment visits, patients cannot have anyone with them due to current Covid guidelines and our immunocompromised population.

## 2021-09-13 NOTE — Progress Notes (Signed)
[facility] Telephone:([phone]   Fax:([phone]  OFFICE PROGRESS NOTE  Cyndi Bender, PA-C [place] [address]  DIAGNOSIS:  Stage IV (T4, N2, M1 a) non-small cell lung cancer, adenocarcinoma presented with multifocal disease involving the right upper lobe, right lower lobe as well as suspicious lower paratracheal lymphadenopathy and groundglass nodules in the left lung diagnosed in May 2022. The patient had molecular studies performed by [facility] and that showed no detectable mutation but this is likely secondary to low-level circulating free tumor DNA.  PRIOR THERAPY: None.  CURRENT THERAPY: palliative systemic chemotherapy with carboplatin for AUC of 5, Alimta 500 Mg/M2 and Avastin 15 Mg/KG every 3 weeks.  The patient is not a great candidate for immunotherapy because of her history of active systemic lupus erythematosus.  She is status post 4 cycles.  INTERVAL HISTORY: Kirsten Williams 50 y.o. female returns to the clinic today for follow-up visit.  The patient is feeling fine today with no concerning complaints.  She is tolerating her current systemic chemotherapy fairly well.  The skin rash has improved.  She denied having any current chest pain, shortness of breath, cough or hemoptysis.  She denied having any fever or chills.  She has no nausea, vomiting, diarrhea or constipation.  She has no headache or visual changes.  She is here today for evaluation before starting cycle #5 of her treatment.  MEDICAL HISTORY: Past Medical History:  Diagnosis Date   Adenocarcinoma, lung, right ([facility]) 05/03/2021   Kirsten Williams is a 50 y.o. female with a history of lung nodules dating back to 2019 who is referred in consultation with Cyndi Bender, PA-C for assessment and management. She is a former smoker having quit in 2016. 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 She missed her screening CT last year and imaging was  ordered last month. CT chest from 02-16-2021 reveals pro   Anxiety    GERD (gastroesophageal reflux disease)    SLE (systemic lupus erythematosus) ([facility]) 05/03/2021   SLE (systemic lupus erythematosus) ([facility]) 05/03/2021    ALLERGIES:  has No Known Allergies.  MEDICATIONS:  Current Outpatient Medications  Medication Sig Dispense Refill   acetaminophen (TYLENOL) 325 MG tablet Take 975 mg by mouth every 6 (six) hours as needed for moderate pain or headache.     cyclobenzaprine (FLEXERIL) 10 MG tablet Take 10 mg by mouth at bedtime as needed for muscle spasms.     dexamethasone (DECADRON) 4 MG tablet 4 mg p.o. twice daily the day before, day of and day after the chemotherapy every 3 weeks. 40 tablet 1   dicyclomine (BENTYL) 10 MG capsule Take 10 mg by mouth every 6 (six) hours as needed.     diphenhydrAMINE (BENADRYL) 25 MG tablet Take 25 mg by mouth daily as needed for allergies.     folic acid (FOLVITE) 1 MG tablet Take 1 tablet (1 mg total) by mouth daily. 30 tablet 4   HYDROcodone-acetaminophen (NORCO/VICODIN) 5-325 MG tablet Take 1 tablet by mouth every 6 (six) hours as needed for moderate pain. 30 tablet 0   hydrocortisone 1 % lotion Apply 1 application topically 2 (two) times daily. 118 mL 0   hydroxychloroquine (PLAQUENIL) 200 MG tablet Take 200 mg by mouth 2 (two) times daily.     Multiple Vitamins-Minerals (MULTI ADULT GUMMIES) CHEW Chew 2 tablets by mouth daily.     omeprazole (PRILOSEC) 40 MG capsule  Take 40 mg by mouth daily as needed (acid reflux).     ondansetron (ZOFRAN) 4 MG tablet Take 8 mg by mouth 2 (two) times daily as needed for vomiting or nausea.     prochlorperazine (COMPAZINE) 10 MG tablet Take 1 tablet (10 mg total) by mouth every 6 (six) hours as needed. 30 tablet 2   rizatriptan (MAXALT-MLT) 10 MG disintegrating tablet Take 10 mg by mouth 2 (two) times daily as needed.     triamcinolone ointment (KENALOG) 0.5 % Apply topically 3 (three) times daily.     No current  facility-administered medications for this visit.    SURGICAL HISTORY:  Past Surgical History:  Procedure Laterality Date   ABDOMINAL HYSTERECTOMY     BRONCHIAL BIOPSY  04/24/2021   Procedure: BRONCHIAL BIOPSIES;  Surgeon: Collene Gobble, MD;  Location: [facility] ENDOSCOPY;  Service: Pulmonary;;   BRONCHIAL BRUSHINGS  04/24/2021   Procedure: BRONCHIAL BRUSHINGS;  Surgeon: Collene Gobble, MD;  Location: [facility] ENDOSCOPY;  Service: Pulmonary;;   BRONCHIAL NEEDLE ASPIRATION BIOPSY  04/24/2021   Procedure: BRONCHIAL NEEDLE ASPIRATION BIOPSIES;  Surgeon: Collene Gobble, MD;  Location: [facility];  Service: Pulmonary;;   BRONCHIAL WASHINGS  04/24/2021   Procedure: BRONCHIAL WASHINGS;  Surgeon: Collene Gobble, MD;  Location: [facility];  Service: Pulmonary;;   CESAREAN SECTION  11/09/1990   DILATION AND CURETTAGE OF UTERUS Bilateral 09/09/1996   FIDUCIAL MARKER PLACEMENT  04/24/2021   Procedure: FIDUCIAL MARKER PLACEMENT;  Surgeon: Collene Gobble, MD;  Location: [facility] ENDOSCOPY;  Service: Pulmonary;;   TONSILLECTOMY  12/10/1977   VIDEO BRONCHOSCOPY WITH ENDOBRONCHIAL NAVIGATION Bilateral 04/24/2021   Procedure: VIDEO BRONCHOSCOPY WITH ENDOBRONCHIAL NAVIGATION;  Surgeon: Collene Gobble, MD;  Location: [facility] ENDOSCOPY;  Service: Pulmonary;  Laterality: Bilateral;    REVIEW OF SYSTEMS:  A comprehensive review of systems was negative.   PHYSICAL EXAMINATION: General appearance: alert, cooperative, and no distress Head: Normocephalic, without obvious abnormality, atraumatic Neck: no adenopathy, no JVD, supple, symmetrical, trachea midline, and thyroid not enlarged, symmetric, no tenderness/mass/nodules Lymph nodes: Cervical, supraclavicular, and axillary nodes normal. Resp: clear to auscultation bilaterally Back: symmetric, no curvature. ROM normal. No CVA tenderness. Cardio: regular rate and rhythm, S1, S2 normal, no murmur, click, rub or gallop GI: soft, non-tender; bowel sounds normal; no masses,  no  organomegaly Extremities: extremities normal, atraumatic, no cyanosis or edema  ECOG PERFORMANCE STATUS: 1 - Symptomatic but completely ambulatory  Blood pressure 118/68, pulse 97, temperature (!) 97.2 F (36.2 C), temperature source Tympanic, resp. rate 19, height 5\' 1"  (1.549 m), weight 117 lb 9.6 oz (53.3 kg), SpO2 99 %.  LABORATORY DATA: Lab Results  Component Value Date   WBC 6.7 09/13/2021   HGB 12.0 09/13/2021   HCT 36.5 09/13/2021   MCV 97.9 09/13/2021   PLT 334 09/13/2021      Chemistry      Component Value Date/Time   NA 140 09/13/2021 0923   NA 140 03/13/2021 0000   K 3.6 09/13/2021 0923   CL 106 09/13/2021 0923   CO2 20 (L) 09/13/2021 0923   BUN 7 09/13/2021 0923   BUN 10 03/13/2021 0000   CREATININE 0.78 09/13/2021 0923   GLU 107 03/13/2021 0000      Component Value Date/Time   CALCIUM 9.7 09/13/2021 0923   ALKPHOS 114 09/13/2021 0923   AST 21 09/13/2021 0923   ALT 63 (H) 09/13/2021 0923   BILITOT <0.2 (L) 09/13/2021 0923       RADIOGRAPHIC  STUDIES: CT Chest W Contrast  Result Date: 08/22/2021 CLINICAL DATA:  Primary Cancer Type: Lung Imaging Indication: Assess response to therapy Interval therapy since last imaging? Yes Initial Cancer Diagnosis Date: 04/24/2021; Established by: Biopsy-proven Detailed Pathology: Stage IV non-small cell lung cancer, adenocarcinoma. Primary Tumor location: Right upper and right lower lobe. Surgeries: Hysterectomy. Chemotherapy: Yes; Ongoing? Yes; Most recent administration: 08/02/2021 Immunotherapy? No Radiation therapy? No EXAM: CT CHEST, ABDOMEN, AND PELVIS WITH CONTRAST TECHNIQUE: Multidetector CT imaging of the chest, abdomen and pelvis was performed following the standard protocol during bolus administration of intravenous contrast. CONTRAST:  1mL OMNIPAQUE IOHEXOL 350 MG/ML SOLN COMPARISON:  03/06/2021 PET-CT.  Most recent CT chest 04/18/2021. FINDINGS: CT CHEST FINDINGS Cardiovascular: Normal heart size. No significant  pericardial effusion/thickening. Atherosclerotic nonaneurysmal thoracic aorta. Normal caliber pulmonary arteries. No central pulmonary emboli. Mediastinum/Nodes: No discrete thyroid nodules. Unremarkable esophagus. No axillary adenopathy. Right paratracheal 0.9 cm top-normal node (series 2/image 22), minimally decreased from 1.0 cm. No pathologically enlarged mediastinal or hilar nodes. Lungs/Pleura: No pneumothorax. No pleural effusion. Sub solid 1.0 x 0.9 cm posterior right upper lobe nodule (series 6/image 31), slightly decreased in size from 1.2 x 0.9 cm on 04/18/2021 chest CT and slightly decreased in density. Sub solid superior segment right lower lobe 1.6 x 1.2 cm nodule (series 6/image 56), slightly decreased from 1.9 x 1.3 cm using similar measurement technique and slightly decreased in density. Several ground-glass pulmonary nodules scattered throughout the left lung, most prominent in the left upper lobe, largest 2.1 cm in the peripheral left upper lobe (series 6/image 30), previously 2.5 cm, mildly decreased. Biopsy clips noted in the upper lobes bilaterally. No acute consolidative airspace disease, lung masses or new significant pulmonary nodules. Musculoskeletal: No aggressive appearing focal osseous lesions. Mild thoracic spondylosis. CT ABDOMEN PELVIS FINDINGS Hepatobiliary: Normal liver with no liver mass. Normal gallbladder with no radiopaque cholelithiasis. No biliary ductal dilatation. Pancreas: Normal, with no mass or duct dilation. Spleen: Normal size. No mass. Adrenals/Urinary Tract: Stable 2.3 cm right adrenal and 2.3 cm left adrenal nodules, previously characterized as benign adenomas. Normal kidneys with no hydronephrosis and no renal mass. Normal bladder. Stomach/Bowel: Normal non-distended stomach. Normal caliber small bowel with no small bowel wall thickening. Normal appendix. Oral contrast transits to the distal colon. Normal large bowel with no diverticulosis, large bowel wall  thickening or pericolonic fat stranding. Vascular/Lymphatic: Atherosclerotic nonaneurysmal abdominal aorta. Patent portal, splenic, hepatic and renal veins. No pathologically enlarged lymph nodes in the abdomen or pelvis. Reproductive: Status post hysterectomy, with no abnormal findings at the vaginal cuff. No adnexal mass. Other: No pneumoperitoneum, ascites or focal fluid collection. Musculoskeletal: No aggressive appearing focal osseous lesions. Mild lumbar spondylosis. IMPRESSION: 1. Subsolid right pulmonary nodules are mildly decreased in size and density. Ground-glass left pulmonary nodules are mildly decreased in size. Previously mildly enlarged right paratracheal lymph node is slightly decreased. Findings are compatible with positive response to therapy. 2. No new or progressive metastatic disease in the chest. 3. No evidence of metastatic disease in the abdomen or pelvis. 4. Stable bilateral adrenal adenomas. 5. Aortic Atherosclerosis (ICD10-I70.0). Electronically Signed   By: Ilona Sorrel M.D.   On: 08/22/2021 11:15   CT Abdomen Pelvis W Contrast  Result Date: 08/22/2021 CLINICAL DATA:  Primary Cancer Type: Lung Imaging Indication: Assess response to therapy Interval therapy since last imaging? Yes Initial Cancer Diagnosis Date: 04/24/2021; Established by: Biopsy-proven Detailed Pathology: Stage IV non-small cell lung cancer, adenocarcinoma. Primary Tumor location: Right upper and right lower  lobe. Surgeries: Hysterectomy. Chemotherapy: Yes; Ongoing? Yes; Most recent administration: 08/02/2021 Immunotherapy? No Radiation therapy? No EXAM: CT CHEST, ABDOMEN, AND PELVIS WITH CONTRAST TECHNIQUE: Multidetector CT imaging of the chest, abdomen and pelvis was performed following the standard protocol during bolus administration of intravenous contrast. CONTRAST:  26mL OMNIPAQUE IOHEXOL 350 MG/ML SOLN COMPARISON:  03/06/2021 PET-CT.  Most recent CT chest 04/18/2021. FINDINGS: CT CHEST FINDINGS Cardiovascular:  Normal heart size. No significant pericardial effusion/thickening. Atherosclerotic nonaneurysmal thoracic aorta. Normal caliber pulmonary arteries. No central pulmonary emboli. Mediastinum/Nodes: No discrete thyroid nodules. Unremarkable esophagus. No axillary adenopathy. Right paratracheal 0.9 cm top-normal node (series 2/image 22), minimally decreased from 1.0 cm. No pathologically enlarged mediastinal or hilar nodes. Lungs/Pleura: No pneumothorax. No pleural effusion. Sub solid 1.0 x 0.9 cm posterior right upper lobe nodule (series 6/image 31), slightly decreased in size from 1.2 x 0.9 cm on 04/18/2021 chest CT and slightly decreased in density. Sub solid superior segment right lower lobe 1.6 x 1.2 cm nodule (series 6/image 56), slightly decreased from 1.9 x 1.3 cm using similar measurement technique and slightly decreased in density. Several ground-glass pulmonary nodules scattered throughout the left lung, most prominent in the left upper lobe, largest 2.1 cm in the peripheral left upper lobe (series 6/image 30), previously 2.5 cm, mildly decreased. Biopsy clips noted in the upper lobes bilaterally. No acute consolidative airspace disease, lung masses or new significant pulmonary nodules. Musculoskeletal: No aggressive appearing focal osseous lesions. Mild thoracic spondylosis. CT ABDOMEN PELVIS FINDINGS Hepatobiliary: Normal liver with no liver mass. Normal gallbladder with no radiopaque cholelithiasis. No biliary ductal dilatation. Pancreas: Normal, with no mass or duct dilation. Spleen: Normal size. No mass. Adrenals/Urinary Tract: Stable 2.3 cm right adrenal and 2.3 cm left adrenal nodules, previously characterized as benign adenomas. Normal kidneys with no hydronephrosis and no renal mass. Normal bladder. Stomach/Bowel: Normal non-distended stomach. Normal caliber small bowel with no small bowel wall thickening. Normal appendix. Oral contrast transits to the distal colon. Normal large bowel with no  diverticulosis, large bowel wall thickening or pericolonic fat stranding. Vascular/Lymphatic: Atherosclerotic nonaneurysmal abdominal aorta. Patent portal, splenic, hepatic and renal veins. No pathologically enlarged lymph nodes in the abdomen or pelvis. Reproductive: Status post hysterectomy, with no abnormal findings at the vaginal cuff. No adnexal mass. Other: No pneumoperitoneum, ascites or focal fluid collection. Musculoskeletal: No aggressive appearing focal osseous lesions. Mild lumbar spondylosis. IMPRESSION: 1. Subsolid right pulmonary nodules are mildly decreased in size and density. Ground-glass left pulmonary nodules are mildly decreased in size. Previously mildly enlarged right paratracheal lymph node is slightly decreased. Findings are compatible with positive response to therapy. 2. No new or progressive metastatic disease in the chest. 3. No evidence of metastatic disease in the abdomen or pelvis. 4. Stable bilateral adrenal adenomas. 5. Aortic Atherosclerosis (ICD10-I70.0). Electronically Signed   By: Ilona Sorrel M.D.   On: 08/22/2021 11:15    ASSESSMENT AND PLAN:  This is a very pleasant 50 years old white female recently diagnosed with a stage IV non-small cell lung cancer, adenocarcinoma with no actionable mutation diagnosed in May 2022.  The patient has also history of systemic lupus erythematosus and she is not a candidate for immunotherapy. She is currently undergoing systemic chemotherapy with carboplatin for AUC of 5, Alimta 500 Mg/M2 and Avastin 15 Mg/KG every 3 weeks status post 4 cycles. She continues to tolerate her treatment well with no concerning adverse effects. I recommended for her to proceed with cycle #5 of her treatment today as planned. For  the skin rash on the chest and back, this has improved with the hydrocortisone 1% cream. For pain management she will continue her current treatment with hydrocodone on as-needed basis. The patient will come back for follow-up  visit in 3 weeks for evaluation before starting cycle #6. The patient was advised to call immediately if she has any concerning symptoms in the interval. The patient voices understanding of current disease status and treatment options and is in agreement with the current care plan.  All questions were answered. The patient knows to call the clinic with any problems, questions or concerns. We can certainly see the patient much sooner if necessary.  Disclaimer: This note was dictated with voice recognition software. Similar sounding words can inadvertently be transcribed and may not be corrected upon review.

## 2021-09-14 ENCOUNTER — Other Ambulatory Visit: Payer: Self-pay | Admitting: Internal Medicine

## 2021-09-18 ENCOUNTER — Telehealth: Payer: Self-pay | Admitting: Internal Medicine

## 2021-09-18 NOTE — Telephone Encounter (Signed)
Left message with follow-up appointments per 10/5 los.

## 2021-09-20 ENCOUNTER — Other Ambulatory Visit: Payer: Self-pay

## 2021-09-20 ENCOUNTER — Inpatient Hospital Stay: Payer: Commercial Managed Care - PPO

## 2021-09-20 DIAGNOSIS — C3491 Malignant neoplasm of unspecified part of right bronchus or lung: Secondary | ICD-10-CM

## 2021-09-20 DIAGNOSIS — Z5112 Encounter for antineoplastic immunotherapy: Secondary | ICD-10-CM | POA: Diagnosis not present

## 2021-09-20 LAB — CBC WITH DIFFERENTIAL (CANCER CENTER ONLY)
Abs Immature Granulocytes: 0.01 10*3/uL (ref 0.00–0.07)
Basophils Absolute: 0 10*3/uL (ref 0.0–0.1)
Basophils Relative: 1 %
Eosinophils Absolute: 0.1 10*3/uL (ref 0.0–0.5)
Eosinophils Relative: 1 %
HCT: 38.7 % (ref 36.0–46.0)
Hemoglobin: 13 g/dL (ref 12.0–15.0)
Immature Granulocytes: 0 %
Lymphocytes Relative: 52 %
Lymphs Abs: 2.5 10*3/uL (ref 0.7–4.0)
MCH: 32.4 pg (ref 26.0–34.0)
MCHC: 33.6 g/dL (ref 30.0–36.0)
MCV: 96.5 fL (ref 80.0–100.0)
Monocytes Absolute: 0.4 10*3/uL (ref 0.1–1.0)
Monocytes Relative: 9 %
Neutro Abs: 1.7 10*3/uL (ref 1.7–7.7)
Neutrophils Relative %: 37 %
Platelet Count: 210 10*3/uL (ref 150–400)
RBC: 4.01 MIL/uL (ref 3.87–5.11)
RDW: 15.8 % — ABNORMAL HIGH (ref 11.5–15.5)
WBC Count: 4.7 10*3/uL (ref 4.0–10.5)
nRBC: 0 % (ref 0.0–0.2)

## 2021-09-20 LAB — CMP (CANCER CENTER ONLY)
ALT: 45 U/L — ABNORMAL HIGH (ref 0–44)
AST: 28 U/L (ref 15–41)
Albumin: 4.5 g/dL (ref 3.5–5.0)
Alkaline Phosphatase: 93 U/L (ref 38–126)
Anion gap: 8 (ref 5–15)
BUN: 12 mg/dL (ref 6–20)
CO2: 22 mmol/L (ref 22–32)
Calcium: 9.3 mg/dL (ref 8.9–10.3)
Chloride: 108 mmol/L (ref 98–111)
Creatinine: 0.58 mg/dL (ref 0.44–1.00)
GFR, Estimated: >60 mL/min (ref >60)
Glucose, Bld: 95 mg/dL (ref 70–99)
Potassium: 3.8 mmol/L (ref 3.5–5.1)
Sodium: 138 mmol/L (ref 135–145)
Total Bilirubin: 0.4 mg/dL (ref 0.3–1.2)
Total Protein: 7.8 g/dL (ref 6.5–8.1)

## 2021-09-26 NOTE — Progress Notes (Signed)
[facility] OFFICE PROGRESS NOTE  Kirsten Bender, [name]  DIAGNOSIS:  Stage IV (T4, N2, M1 a) non-small cell lung cancer, adenocarcinoma presented with multifocal disease involving the right upper lobe, right lower lobe as well as suspicious lower paratracheal lymphadenopathy and groundglass nodules in the left lung diagnosed in May 2022. The patient had molecular studies performed by [facility] and that showed no detectable mutation but this is likely secondary to low-level circulating free tumor DNA.  PRIOR THERAPY:  None  CURRENT THERAPY: Palliative systemic chemotherapy with carboplatin for AUC of 5, Alimta 500 Mg/M2 and Avastin 15 Mg/KG every 3 weeks.  The patient is not a great candidate for immunotherapy because of her history of active systemic lupus erythematosus.  She is status post 5 cycles.   INTERVAL HISTORY: Kirsten Williams 50 y.o. female returns to the clinic today for a follow-up visit.  The patient is feeling fairly well today without any concerning complaints except for joint pain. She has a history of lupus and was wondering if we are monitoring her lupus levels or if she needs to see her rheumatologist. She does not have any metastatic disease to the bone from prior imaging. Her scan performed in September 2022 showed a positive response to treatment. Despite this, she has been having ongoing issues related to shoulder pain (worse with laying on it) and a tight feeling around her diaphragm for which she takes norco once a day if needed. She will also take tylenol and icy hot patches. She spoke to someone a few months ago from her insurance company which advised her to ask [place] about switching to tramadol. She is about to run out of her norco.    She is tolerated her last cycle of treatment well without any concerning adverse side effects.  She denies any recent fevers or chills. She has occasional night sweats "here and there".  She often feels fatigued the first week after treatment.  Her symptoms usually improved by the second week. She denies any night sweats or chills. She was previously followed by member the nutritionist team for her decreased appetite and her weight is stable at this time. She notes her appetite is now "pretty good".  She denies any significant shortness of breath or hemoptysis, cough, or chest pain.  She has mild nausea which is controlled with her anti-emetic. She denies any vomiting or constipation. She has chronic diarrhea which is chronic for her.  She denies any abnormal bleeding or bruising.  She occasionally has self-limiting headaches which is not unusual for her.  She denies any visual changes.  The patient is here today for evaluation and repeat blood work before starting cycle #6.    MEDICAL HISTORY: Past Medical History:  Diagnosis Date   Adenocarcinoma, lung, right ([place]) 05/03/2021   Kirsten Williams is a 50 y.o. female with a history of lung nodules dating back to 2019 who is referred in consultation with Kirsten Bender, PA-C for assessment and management. She is a former smoker having quit in 2016. 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 She missed her screening CT last year and imaging was ordered last month. CT chest from 02-16-2021 reveals pro   Anxiety    GERD (gastroesophageal reflux disease)    SLE (systemic lupus erythematosus) ([place]) 05/03/2021   SLE (systemic lupus erythematosus) ([place]) 05/03/2021    ALLERGIES:  has No Known Allergies.  MEDICATIONS:  Current Outpatient Medications  Medication Sig Dispense Refill   traMADol (ULTRAM) 50 MG tablet Take 1 tablet (50 mg total) by mouth every 6 (six) hours as needed. 30 tablet 0   acetaminophen (TYLENOL) 325 MG tablet Take 975 mg by mouth every 6 (six) hours as needed for moderate pain or headache.     cyclobenzaprine (FLEXERIL) 10 MG tablet Take 10 mg by mouth at bedtime as needed for muscle spasms.      dexamethasone (DECADRON) 4 MG tablet 4 mg p.o. twice daily the day before, day of and day after the chemotherapy every 3 weeks. 40 tablet 1   dicyclomine (BENTYL) 10 MG capsule Take 10 mg by mouth every 6 (six) hours as needed.     diphenhydrAMINE (BENADRYL) 25 MG tablet Take 25 mg by mouth daily as needed for allergies.     folic acid (FOLVITE) 1 MG tablet Take 1 tablet (1 mg total) by mouth daily. 30 tablet 4   hydrocortisone 1 % lotion Apply 1 application topically 2 (two) times daily. 118 mL 0   hydroxychloroquine (PLAQUENIL) 200 MG tablet Take 200 mg by mouth 2 (two) times daily.     Multiple Vitamins-Minerals (MULTI ADULT GUMMIES) CHEW Chew 2 tablets by mouth daily.     omeprazole (PRILOSEC) 40 MG capsule Take 40 mg by mouth daily as needed (acid reflux).     ondansetron (ZOFRAN) 4 MG tablet Take 8 mg by mouth 2 (two) times daily as needed for vomiting or nausea.     prochlorperazine (COMPAZINE) 10 MG tablet Take 1 tablet (10 mg total) by mouth every 6 (six) hours as needed. 30 tablet 2   rizatriptan (MAXALT-MLT) 10 MG disintegrating tablet Take 10 mg by mouth 2 (two) times daily as needed.     triamcinolone ointment (KENALOG) 0.5 % Apply topically 3 (three) times daily.     No current facility-administered medications for this visit.   Facility-Administered Medications Ordered in Other Visits  Medication Dose Route Frequency Provider Last Rate Last Admin   bevacizumab-awwb (MVASI) 800 mg in sodium chloride 0.9 % 100 mL chemo infusion  15 mg/kg (Treatment Plan Recorded) Intravenous Once Curt Bears, MD       CARBOplatin (PARAPLATIN) 470 mg in sodium chloride 0.9 % 250 mL chemo infusion  470 mg Intravenous Once Curt Bears, MD       dexamethasone (DECADRON) 10 mg in sodium chloride 0.9 % 50 mL IVPB  10 mg Intravenous Once Curt Bears, MD 204 mL/hr at 10/04/21 1141 10 mg at 10/04/21 1141   fosaprepitant (EMEND) 150 mg in sodium chloride 0.9 % 145 mL IVPB  150 mg Intravenous  Once Curt Bears, MD 450 mL/hr at 10/04/21 1143 150 mg at 10/04/21 1143   PEMEtrexed (ALIMTA) 800 mg in sodium chloride 0.9 % 100 mL chemo infusion  500 mg/m2 (Treatment Plan Recorded) Intravenous Once Curt Bears, MD        SURGICAL HISTORY:  Past Surgical History:  Procedure Laterality Date   ABDOMINAL HYSTERECTOMY     BRONCHIAL BIOPSY  04/24/2021   Procedure: BRONCHIAL BIOPSIES;  Surgeon: Collene Gobble, MD;  Location: [facility] ENDOSCOPY;  Service: Pulmonary;;   BRONCHIAL BRUSHINGS  04/24/2021   Procedure: BRONCHIAL BRUSHINGS;  Surgeon: Collene Gobble, MD;  Location: [facility];  Service: Pulmonary;;   BRONCHIAL NEEDLE ASPIRATION BIOPSY  04/24/2021   Procedure: BRONCHIAL NEEDLE ASPIRATION BIOPSIES;  Surgeon: Collene Gobble, MD;  Location: [facility];  Service: Pulmonary;;   BRONCHIAL WASHINGS  04/24/2021  Procedure: BRONCHIAL WASHINGS;  Surgeon: Collene Gobble, MD;  Location: [facility] ENDOSCOPY;  Service: Pulmonary;;   CESAREAN SECTION  11/09/1990   DILATION AND CURETTAGE OF UTERUS Bilateral 09/09/1996   FIDUCIAL MARKER PLACEMENT  04/24/2021   Procedure: FIDUCIAL MARKER PLACEMENT;  Surgeon: Collene Gobble, MD;  Location: [facility] ENDOSCOPY;  Service: Pulmonary;;   TONSILLECTOMY  12/10/1977   VIDEO BRONCHOSCOPY WITH ENDOBRONCHIAL NAVIGATION Bilateral 04/24/2021   Procedure: VIDEO BRONCHOSCOPY WITH ENDOBRONCHIAL NAVIGATION;  Surgeon: Collene Gobble, MD;  Location: [facility] ENDOSCOPY;  Service: Pulmonary;  Laterality: Bilateral;    REVIEW OF SYSTEMS:   Review of Systems  Constitutional: Positive for fatigue 1 week following treatment. Negative for appetite change, chills, fever and unexpected weight change.  HENT: Negative for mouth sores, nosebleeds, sore throat and trouble swallowing.   Eyes: Negative for eye problems and icterus. Visual blurring improved.  Respiratory: Negative for cough, hemoptysis, shortness of breath and wheezing.   Cardiovascular: Negative for chest pain and leg  swelling.  Gastrointestinal: Negative for abdominal pain, constipation, diarrhea, nausea and vomiting.  Genitourinary: Negative for bladder incontinence, difficulty urinating, dysuria, frequency and hematuria.   Musculoskeletal: Positive for joint pain. Negative for back pain, gait problem, neck pain and neck stiffness.  Skin: Positive for itching/rash around neck and face  Neurological: Negative for dizziness, extremity weakness, gait problem, headaches, light-headedness and seizures.  Hematological: Negative for adenopathy. Does not bruise/bleed easily.  Psychiatric/Behavioral: Negative for confusion, depression and sleep disturbance. The patient is not nervous/anxious.      PHYSICAL EXAMINATION:  Blood pressure 133/79, pulse 87, temperature 97.6 F (36.4 C), temperature source Tympanic, resp. rate 16, height 5\' 1"  (1.549 m), weight 118 lb 1.6 oz (53.6 kg), SpO2 100 %.  ECOG PERFORMANCE STATUS: 1  Physical Exam  Constitutional: Oriented to person, place, and time and well-developed, well-nourished, and in no distress.  HENT:  Head: Normocephalic and atraumatic.  Mouth/Throat: Oropharynx is clear and moist. No oropharyngeal exudate.  Eyes: Conjunctivae are normal. Right eye exhibits no discharge. Left eye exhibits no discharge. No scleral icterus.  Neck: Normal range of motion. Neck supple.  Cardiovascular: Normal rate, regular rhythm, normal heart sounds and intact distal pulses.   Pulmonary/Chest: Effort normal and breath sounds normal. No respiratory distress. No wheezes. No rales.  Abdominal: Soft. Bowel sounds are normal. Exhibits no distension and no mass. There is no tenderness.  Musculoskeletal: Normal range of motion. Exhibits no edema.  Lymphadenopathy:    No cervical adenopathy.  Neurological: Alert and oriented to person, place, and time. Exhibits normal muscle tone. Gait normal. Coordination normal.  Skin: Skin is warm and dry. No rash noted. Not diaphoretic. No erythema.  No pallor.  Psychiatric: Mood, memory and judgment normal.  Vitals reviewed.  LABORATORY DATA: Lab Results  Component Value Date   WBC 6.7 10/04/2021   HGB 12.2 10/04/2021   HCT 36.3 10/04/2021   MCV 98.4 10/04/2021   PLT 266 10/04/2021      Chemistry      Component Value Date/Time   NA 140 10/04/2021 0920   NA 140 03/13/2021 0000   K 3.7 10/04/2021 0920   CL 104 10/04/2021 0920   CO2 21 (L) 10/04/2021 0920   BUN 12 10/04/2021 0920   BUN 10 03/13/2021 0000   CREATININE 0.79 10/04/2021 0920   GLU 107 03/13/2021 0000      Component Value Date/Time   CALCIUM 9.9 10/04/2021 0920   ALKPHOS 100 10/04/2021 0920   AST 21 10/04/2021 0920  ALT 39 10/04/2021 0920   BILITOT 0.3 10/04/2021 0920       RADIOGRAPHIC STUDIES:  No results found.   ASSESSMENT/PLAN:  This is a very pleasant 50 year old Caucasian female recently diagnosed with stage IV (T4, N2, M1 a) non-small cell lung cancer, adenocarcinoma presented with multifocal disease involving the right upper lobe, right lower lobe as well as suspicious lower paratracheal lymphadenopathy and groundglass nodules in the left lung diagnosed in May 2022. The patient had molecular studies performed by [facility] and that showed no detectable mutation but this is likely secondary to low-level circulating free tumor DNA.   Since the patient has a history of systemic lupus erythematosus, she is not a candidate for immunotherapy.   The patient is currently undergoing systemic chemotherapy with carboplatin for an AUC 5, Alimta 500 mg per metered square, and and Avastin 1500 mg/kg IV every 3 weeks.  She is status post 5 cycles.   Labs were reviewed.  Recommend that she proceed with cycle #6 today scheduled.  I will arrange for restaging CT scan before starting her next cycle of treatment.  We will see her back for follow-up visit in 3 weeks for evaluation and to review her scan results before starting cycle #7.  She will continue  to use Tylenol or Norco if needed for joint pain. We will reassess her response to treatment on her upcoming scan. Of note, her last scan did not show any aggressive osseous lesions and this has been a persistent concern. Discussed that avastin can cause extremity pain. I will refill her prescription for tramadol to see if that works better for her than norco at the patient's request. She knows not to take both of these medications.   Advised her she should continue to see her rheumatologist for her lupus management.   The patient was advised to call immediately if she has any concerning symptoms in the interval. The patient voices understanding of current disease status and treatment options and is in agreement with the current care plan. All questions were answered. The patient knows to call the clinic with any problems, questions or concerns. We can certainly see the patient much sooner if necessary         Orders Placed This Encounter  Procedures   CT Chest W Contrast    Standing Status:   Future    Standing Expiration Date:   10/04/2022    Order Specific Question:   If indicated for the ordered procedure, I authorize the administration of contrast media per Radiology protocol    Answer:   Yes    Order Specific Question:   Is patient pregnant?    Answer:   No    Order Specific Question:   Preferred imaging location?    Answer:   [facility]   CT Abdomen Pelvis W Contrast    Standing Status:   Future    Standing Expiration Date:   10/04/2022    Order Specific Question:   If indicated for the ordered procedure, I authorize the administration of contrast media per Radiology protocol    Answer:   Yes    Order Specific Question:   Is patient pregnant?    Answer:   No    Order Specific Question:   Preferred imaging location?    Answer:   [facility]    Order Specific Question:   Is Oral Contrast requested for this exam?    Answer:   Yes, Per Radiology protocol  The total time spent in the appointment was 20-29 minutes   Amilliana Hayworth L Shahara Hartsfield, PA-C 10/04/21

## 2021-09-27 ENCOUNTER — Other Ambulatory Visit: Payer: Self-pay

## 2021-09-27 ENCOUNTER — Inpatient Hospital Stay: Payer: Commercial Managed Care - PPO

## 2021-09-27 DIAGNOSIS — C3491 Malignant neoplasm of unspecified part of right bronchus or lung: Secondary | ICD-10-CM

## 2021-09-27 DIAGNOSIS — Z5112 Encounter for antineoplastic immunotherapy: Secondary | ICD-10-CM | POA: Diagnosis not present

## 2021-09-27 LAB — CBC WITH DIFFERENTIAL (CANCER CENTER ONLY)
Abs Immature Granulocytes: 0.01 10*3/uL (ref 0.00–0.07)
Basophils Absolute: 0 10*3/uL (ref 0.0–0.1)
Basophils Relative: 0 %
Eosinophils Absolute: 0.1 10*3/uL (ref 0.0–0.5)
Eosinophils Relative: 1 %
HCT: 38.7 % (ref 36.0–46.0)
Hemoglobin: 12.8 g/dL (ref 12.0–15.0)
Immature Granulocytes: 0 %
Lymphocytes Relative: 22 %
Lymphs Abs: 1.5 10*3/uL (ref 0.7–4.0)
MCH: 32.7 pg (ref 26.0–34.0)
MCHC: 33.1 g/dL (ref 30.0–36.0)
MCV: 99 fL (ref 80.0–100.0)
Monocytes Absolute: 0.5 10*3/uL (ref 0.1–1.0)
Monocytes Relative: 8 %
Neutro Abs: 4.6 10*3/uL (ref 1.7–7.7)
Neutrophils Relative %: 69 %
Platelet Count: 134 10*3/uL — ABNORMAL LOW (ref 150–400)
RBC: 3.91 MIL/uL (ref 3.87–5.11)
RDW: 17.2 % — ABNORMAL HIGH (ref 11.5–15.5)
WBC Count: 6.6 10*3/uL (ref 4.0–10.5)
nRBC: 0 % (ref 0.0–0.2)

## 2021-09-27 LAB — CMP (CANCER CENTER ONLY)
ALT: 97 U/L — ABNORMAL HIGH (ref 0–44)
AST: 54 U/L — ABNORMAL HIGH (ref 15–41)
Albumin: 4.2 g/dL (ref 3.5–5.0)
Alkaline Phosphatase: 102 U/L (ref 38–126)
Anion gap: 10 (ref 5–15)
BUN: 9 mg/dL (ref 6–20)
CO2: 19 mmol/L — ABNORMAL LOW (ref 22–32)
Calcium: 9.9 mg/dL (ref 8.9–10.3)
Chloride: 109 mmol/L (ref 98–111)
Creatinine: 0.68 mg/dL (ref 0.44–1.00)
GFR, Estimated: >60 mL/min (ref >60)
Glucose, Bld: 96 mg/dL (ref 70–99)
Potassium: 4.3 mmol/L (ref 3.5–5.1)
Sodium: 138 mmol/L (ref 135–145)
Total Bilirubin: <0.2 mg/dL — ABNORMAL LOW (ref 0.3–1.2)
Total Protein: 7.8 g/dL (ref 6.5–8.1)

## 2021-10-04 ENCOUNTER — Other Ambulatory Visit: Payer: Self-pay

## 2021-10-04 ENCOUNTER — Inpatient Hospital Stay: Payer: Commercial Managed Care - PPO | Admitting: Physician Assistant

## 2021-10-04 ENCOUNTER — Telehealth: Payer: Self-pay

## 2021-10-04 ENCOUNTER — Inpatient Hospital Stay: Payer: Commercial Managed Care - PPO

## 2021-10-04 VITALS — BP 125/83 | HR 81 | Resp 16

## 2021-10-04 VITALS — BP 133/79 | HR 87 | Temp 97.6°F | Resp 16 | Ht 61.0 in | Wt 118.1 lb

## 2021-10-04 DIAGNOSIS — Z5112 Encounter for antineoplastic immunotherapy: Secondary | ICD-10-CM | POA: Diagnosis not present

## 2021-10-04 DIAGNOSIS — C3491 Malignant neoplasm of unspecified part of right bronchus or lung: Secondary | ICD-10-CM | POA: Diagnosis not present

## 2021-10-04 DIAGNOSIS — G893 Neoplasm related pain (acute) (chronic): Secondary | ICD-10-CM | POA: Diagnosis not present

## 2021-10-04 DIAGNOSIS — Z5111 Encounter for antineoplastic chemotherapy: Secondary | ICD-10-CM

## 2021-10-04 LAB — CMP (CANCER CENTER ONLY)
ALT: 39 U/L (ref 0–44)
AST: 21 U/L (ref 15–41)
Albumin: 4.1 g/dL (ref 3.5–5.0)
Alkaline Phosphatase: 100 U/L (ref 38–126)
Anion gap: 15 (ref 5–15)
BUN: 12 mg/dL (ref 6–20)
CO2: 21 mmol/L — ABNORMAL LOW (ref 22–32)
Calcium: 9.9 mg/dL (ref 8.9–10.3)
Chloride: 104 mmol/L (ref 98–111)
Creatinine: 0.79 mg/dL (ref 0.44–1.00)
GFR, Estimated: >60 mL/min (ref >60)
Glucose, Bld: 142 mg/dL — ABNORMAL HIGH (ref 70–99)
Potassium: 3.7 mmol/L (ref 3.5–5.1)
Sodium: 140 mmol/L (ref 135–145)
Total Bilirubin: 0.3 mg/dL (ref 0.3–1.2)
Total Protein: 7.6 g/dL (ref 6.5–8.1)

## 2021-10-04 LAB — CBC WITH DIFFERENTIAL (CANCER CENTER ONLY)
Abs Immature Granulocytes: 0.01 10*3/uL (ref 0.00–0.07)
Basophils Absolute: 0 10*3/uL (ref 0.0–0.1)
Basophils Relative: 0 %
Eosinophils Absolute: 0 10*3/uL (ref 0.0–0.5)
Eosinophils Relative: 0 %
HCT: 36.3 % (ref 36.0–46.0)
Hemoglobin: 12.2 g/dL (ref 12.0–15.0)
Immature Granulocytes: 0 %
Lymphocytes Relative: 14 %
Lymphs Abs: 0.9 10*3/uL (ref 0.7–4.0)
MCH: 33.1 pg (ref 26.0–34.0)
MCHC: 33.6 g/dL (ref 30.0–36.0)
MCV: 98.4 fL (ref 80.0–100.0)
Monocytes Absolute: 0.5 10*3/uL (ref 0.1–1.0)
Monocytes Relative: 8 %
Neutro Abs: 5.2 10*3/uL (ref 1.7–7.7)
Neutrophils Relative %: 78 %
Platelet Count: 266 10*3/uL (ref 150–400)
RBC: 3.69 MIL/uL — ABNORMAL LOW (ref 3.87–5.11)
RDW: 17.2 % — ABNORMAL HIGH (ref 11.5–15.5)
WBC Count: 6.7 10*3/uL (ref 4.0–10.5)
nRBC: 0 % (ref 0.0–0.2)

## 2021-10-04 LAB — TOTAL PROTEIN, URINE DIPSTICK: Protein, ur: NEGATIVE mg/dL

## 2021-10-04 MED ORDER — DIPHENHYDRAMINE HCL 50 MG/ML IJ SOLN
50.0000 mg | Freq: Once | INTRAMUSCULAR | Status: AC | PRN
  Administered 2021-10-04: 50 mg via INTRAVENOUS

## 2021-10-04 MED ORDER — SODIUM CHLORIDE 0.9 % IV SOLN
10.0000 mg | Freq: Once | INTRAVENOUS | Status: AC
  Administered 2021-10-04: 10 mg via INTRAVENOUS
  Filled 2021-10-04: qty 10

## 2021-10-04 MED ORDER — TRAMADOL HCL 50 MG PO TABS: 50.0000 mg | ORAL_TABLET | Freq: Four times a day (QID) | ORAL | 0 refills | Status: DC | PRN

## 2021-10-04 MED ORDER — PALONOSETRON HCL INJECTION 0.25 MG/5ML
0.2500 mg | Freq: Once | INTRAVENOUS | Status: AC
  Administered 2021-10-04: 0.25 mg via INTRAVENOUS
  Filled 2021-10-04: qty 5

## 2021-10-04 MED ORDER — SODIUM CHLORIDE 0.9 % IV SOLN
500.0000 mg/m2 | Freq: Once | INTRAVENOUS | Status: AC
  Administered 2021-10-04: 800 mg via INTRAVENOUS
  Filled 2021-10-04: qty 20

## 2021-10-04 MED ORDER — SODIUM CHLORIDE 0.9 % IV SOLN
471.5000 mg | Freq: Once | INTRAVENOUS | Status: AC
  Administered 2021-10-04: 470 mg via INTRAVENOUS
  Filled 2021-10-04: qty 47

## 2021-10-04 MED ORDER — SODIUM CHLORIDE 0.9 % IV SOLN
15.0000 mg/kg | Freq: Once | INTRAVENOUS | Status: AC
  Administered 2021-10-04: 800 mg via INTRAVENOUS
  Filled 2021-10-04: qty 32

## 2021-10-04 MED ORDER — FAMOTIDINE 20 MG IN NS 100 ML IVPB
20.0000 mg | Freq: Once | INTRAVENOUS | Status: AC | PRN
  Administered 2021-10-04: 20 mg via INTRAVENOUS

## 2021-10-04 MED ORDER — SODIUM CHLORIDE 0.9 % IV SOLN: Freq: Once | INTRAVENOUS | Status: AC

## 2021-10-04 MED ORDER — SODIUM CHLORIDE 0.9 % IV SOLN
150.0000 mg | Freq: Once | INTRAVENOUS | Status: AC
  Administered 2021-10-04: 150 mg via INTRAVENOUS
  Filled 2021-10-04: qty 150

## 2021-10-04 NOTE — Telephone Encounter (Signed)
Pt called stating the Benadryl she was been given helped with her allergic reaction and she is not itching or red anymore, however, she has had an episode of blood in her urine. She was advised to go to her nearest ER. Pt states she wants to wait until she urinates again to see if the blood is still there and if it is, she states she is going to the ER for [facility].

## 2021-10-04 NOTE — Progress Notes (Signed)
Hypersensitivity Reaction note  Date of event: 10/04/21 Time of event: 1340 Generic name of drug involved: carboplatin Name of provider notified of the hypersensitivity reaction: Cassie Heilengoeter, PA  Was agent that likely caused hypersensitivity reaction added to Allergies List within EMR? Yes Chain of events including reaction signs/symptoms, treatment administered, and outcome (e.g., drug resumed; drug discontinued; sent to Emergency Department; etc.)  Erythema & pruititis to hands/arms/feet/perineal area.  BP elevated (see flowsheets)  No respiratory distress.  50mg  IV benadryl and 20 IV Pepcid given.  Pt monitored for 15 minutes post.  Symptoms resolved.    Regan Rakers, RN 10/04/2021 2:07 PM

## 2021-10-04 NOTE — Patient Instructions (Signed)
[facility]  Discharge Instructions: Thank you for choosing [facility] to provide your oncology and hematology care.   If you have a lab appointment with the [facility], please go directly to the [facility] and check in at the registration area.   Wear comfortable clothing and clothing appropriate for easy access to any Portacath or PICC line.   We strive to give you quality time with your provider. You may need to reschedule your appointment if you arrive late (15 or more minutes).  Arriving late affects you and other patients whose appointments are after yours.  Also, if you miss three or more appointments without notifying the office, you may be dismissed from the clinic at the provider's discretion.      For prescription refill requests, have your pharmacy contact our office and allow 72 hours for refills to be completed.    Today you received the following chemotherapy and/or immunotherapy agents :  Bevacizumab, Pemetrexed, & Carboplatin.       To help prevent nausea and vomiting after your treatment, we encourage you to take your nausea medication as directed.  BELOW ARE SYMPTOMS THAT SHOULD BE REPORTED IMMEDIATELY: *FEVER GREATER THAN 100.4 F (38 C) OR HIGHER *CHILLS OR SWEATING *NAUSEA AND VOMITING THAT IS NOT CONTROLLED WITH YOUR NAUSEA MEDICATION *UNUSUAL SHORTNESS OF BREATH *UNUSUAL BRUISING OR BLEEDING *URINARY PROBLEMS (pain or burning when urinating, or frequent urination) *BOWEL PROBLEMS (unusual diarrhea, constipation, pain near the anus) TENDERNESS IN MOUTH AND THROAT WITH OR WITHOUT PRESENCE OF ULCERS (sore throat, sores in mouth, or a toothache) UNUSUAL RASH, SWELLING OR PAIN  UNUSUAL VAGINAL DISCHARGE OR ITCHING   Items with * indicate a potential emergency and should be followed up as soon as possible or go to the Emergency Department if any problems should occur.  Please show the CHEMOTHERAPY ALERT CARD or  IMMUNOTHERAPY ALERT CARD at check-in to the Emergency Department and triage nurse.  Should you have questions after your visit or need to cancel or reschedule your appointment, please contact [facility]  Dept: [phone]  and follow the prompts.  Office hours are 8:00 a.m. to 4:30 p.m. Monday - Friday. Please note that voicemails left after 4:00 p.m. may not be returned until the following business day.  We are closed weekends and major holidays. You have access to a nurse at all times for urgent questions. Please call the main number to the clinic Dept: [phone] and follow the prompts.   For any non-urgent questions, you may also contact your provider using MyChart. We now offer e-Visits for anyone 90 and older to request care online for non-urgent symptoms. For details visit [email or web address].   Also download the MyChart app! Go to the app store, search "MyChart", open the app, select [facility], and log in with your MyChart username and password.  Due to Covid, a mask is required upon entering the hospital/clinic. If you do not have a mask, one will be given to you upon arrival. For doctor visits, patients may have 1 support person aged 69 or older with them. For treatment visits, patients cannot have anyone with them due to current Covid guidelines and our immunocompromised population.

## 2021-10-11 ENCOUNTER — Inpatient Hospital Stay: Payer: Commercial Managed Care - PPO | Attending: Hematology and Oncology

## 2021-10-11 ENCOUNTER — Other Ambulatory Visit: Payer: Self-pay

## 2021-10-11 DIAGNOSIS — C3411 Malignant neoplasm of upper lobe, right bronchus or lung: Secondary | ICD-10-CM | POA: Insufficient documentation

## 2021-10-11 DIAGNOSIS — M329 Systemic lupus erythematosus, unspecified: Secondary | ICD-10-CM | POA: Insufficient documentation

## 2021-10-11 DIAGNOSIS — L0292 Furuncle, unspecified: Secondary | ICD-10-CM | POA: Diagnosis not present

## 2021-10-11 DIAGNOSIS — Z5111 Encounter for antineoplastic chemotherapy: Secondary | ICD-10-CM | POA: Insufficient documentation

## 2021-10-11 DIAGNOSIS — Z5112 Encounter for antineoplastic immunotherapy: Secondary | ICD-10-CM | POA: Insufficient documentation

## 2021-10-11 DIAGNOSIS — C3491 Malignant neoplasm of unspecified part of right bronchus or lung: Secondary | ICD-10-CM

## 2021-10-11 LAB — CBC WITH DIFFERENTIAL (CANCER CENTER ONLY)
Abs Immature Granulocytes: 0.01 10*3/uL (ref 0.00–0.07)
Basophils Absolute: 0 10*3/uL (ref 0.0–0.1)
Basophils Relative: 1 %
Eosinophils Absolute: 0 10*3/uL (ref 0.0–0.5)
Eosinophils Relative: 1 %
HCT: 39.9 % (ref 36.0–46.0)
Hemoglobin: 13 g/dL (ref 12.0–15.0)
Immature Granulocytes: 0 %
Lymphocytes Relative: 50 %
Lymphs Abs: 2.3 10*3/uL (ref 0.7–4.0)
MCH: 32.1 pg (ref 26.0–34.0)
MCHC: 32.6 g/dL (ref 30.0–36.0)
MCV: 98.5 fL (ref 80.0–100.0)
Monocytes Absolute: 0.4 10*3/uL (ref 0.1–1.0)
Monocytes Relative: 9 %
Neutro Abs: 1.8 10*3/uL (ref 1.7–7.7)
Neutrophils Relative %: 39 %
Platelet Count: 181 10*3/uL (ref 150–400)
RBC: 4.05 MIL/uL (ref 3.87–5.11)
RDW: 15.2 % (ref 11.5–15.5)
Smear Review: NORMAL
WBC Count: 4.6 10*3/uL (ref 4.0–10.5)
nRBC: 0 % (ref 0.0–0.2)

## 2021-10-11 LAB — CMP (CANCER CENTER ONLY)
ALT: 85 U/L — ABNORMAL HIGH (ref 0–44)
AST: 45 U/L — ABNORMAL HIGH (ref 15–41)
Albumin: 4.1 g/dL (ref 3.5–5.0)
Alkaline Phosphatase: 97 U/L (ref 38–126)
Anion gap: 13 (ref 5–15)
BUN: 8 mg/dL (ref 6–20)
CO2: 20 mmol/L — ABNORMAL LOW (ref 22–32)
Calcium: 9.3 mg/dL (ref 8.9–10.3)
Chloride: 106 mmol/L (ref 98–111)
Creatinine: 0.73 mg/dL (ref 0.44–1.00)
GFR, Estimated: >60 mL/min (ref >60)
Glucose, Bld: 98 mg/dL (ref 70–99)
Potassium: 3.9 mmol/L (ref 3.5–5.1)
Sodium: 139 mmol/L (ref 135–145)
Total Bilirubin: 0.3 mg/dL (ref 0.3–1.2)
Total Protein: 7.7 g/dL (ref 6.5–8.1)

## 2021-10-18 ENCOUNTER — Other Ambulatory Visit: Payer: Self-pay

## 2021-10-18 ENCOUNTER — Inpatient Hospital Stay: Payer: Commercial Managed Care - PPO

## 2021-10-18 DIAGNOSIS — Z5112 Encounter for antineoplastic immunotherapy: Secondary | ICD-10-CM | POA: Diagnosis not present

## 2021-10-18 DIAGNOSIS — C3491 Malignant neoplasm of unspecified part of right bronchus or lung: Secondary | ICD-10-CM

## 2021-10-18 LAB — CBC WITH DIFFERENTIAL (CANCER CENTER ONLY)
Abs Immature Granulocytes: 0.02 10*3/uL (ref 0.00–0.07)
Basophils Absolute: 0 10*3/uL (ref 0.0–0.1)
Basophils Relative: 0 %
Eosinophils Absolute: 0.1 10*3/uL (ref 0.0–0.5)
Eosinophils Relative: 1 %
HCT: 36 % (ref 36.0–46.0)
Hemoglobin: 12.2 g/dL (ref 12.0–15.0)
Immature Granulocytes: 0 %
Lymphocytes Relative: 33 %
Lymphs Abs: 1.7 10*3/uL (ref 0.7–4.0)
MCH: 33.8 pg (ref 26.0–34.0)
MCHC: 33.9 g/dL (ref 30.0–36.0)
MCV: 99.7 fL (ref 80.0–100.0)
Monocytes Absolute: 0.5 10*3/uL (ref 0.1–1.0)
Monocytes Relative: 9 %
Neutro Abs: 2.8 10*3/uL (ref 1.7–7.7)
Neutrophils Relative %: 57 %
Platelet Count: 148 10*3/uL — ABNORMAL LOW (ref 150–400)
RBC: 3.61 MIL/uL — ABNORMAL LOW (ref 3.87–5.11)
RDW: 15.9 % — ABNORMAL HIGH (ref 11.5–15.5)
WBC Count: 5.1 10*3/uL (ref 4.0–10.5)
nRBC: 0 % (ref 0.0–0.2)

## 2021-10-18 LAB — CMP (CANCER CENTER ONLY)
ALT: 53 U/L — ABNORMAL HIGH (ref 0–44)
AST: 31 U/L (ref 15–41)
Albumin: 3.8 g/dL (ref 3.5–5.0)
Alkaline Phosphatase: 92 U/L (ref 38–126)
Anion gap: 10 (ref 5–15)
BUN: 8 mg/dL (ref 6–20)
CO2: 22 mmol/L (ref 22–32)
Calcium: 9.2 mg/dL (ref 8.9–10.3)
Chloride: 106 mmol/L (ref 98–111)
Creatinine: 0.71 mg/dL (ref 0.44–1.00)
GFR, Estimated: >60 mL/min (ref >60)
Glucose, Bld: 98 mg/dL (ref 70–99)
Potassium: 4.2 mmol/L (ref 3.5–5.1)
Sodium: 138 mmol/L (ref 135–145)
Total Bilirubin: <0.2 mg/dL — ABNORMAL LOW (ref 0.3–1.2)
Total Protein: 7.3 g/dL (ref 6.5–8.1)

## 2021-10-20 ENCOUNTER — Ambulatory Visit (HOSPITAL_COMMUNITY)
Admission: RE | Admit: 2021-10-20 | Discharge: 2021-10-20 | Disposition: A | Discharge status: Home / Self Care | Payer: Commercial Managed Care - PPO | Source: Ambulatory Visit | Attending: Physician Assistant | Admitting: Physician Assistant

## 2021-10-20 ENCOUNTER — Encounter (HOSPITAL_COMMUNITY): Payer: Self-pay

## 2021-10-20 DIAGNOSIS — C3491 Malignant neoplasm of unspecified part of right bronchus or lung: Secondary | ICD-10-CM | POA: Insufficient documentation

## 2021-10-20 MED ORDER — IOHEXOL 350 MG/ML SOLN
80.0000 mL | Freq: Once | INTRAVENOUS | Status: AC | PRN
  Administered 2021-10-20: 80 mL via INTRAVENOUS

## 2021-10-20 NOTE — Progress Notes (Signed)
[facility] OFFICE PROGRESS NOTE  Cyndi Bender, PA-C [place] [address]  DIAGNOSIS: Stage IV (T4, N2, M1 a) non-small cell lung cancer, adenocarcinoma presented with multifocal disease involving the right upper lobe, right lower lobe as well as suspicious lower paratracheal lymphadenopathy and groundglass nodules in the left lung diagnosed in May 2022. The patient had molecular studies performed by [facility] and that showed no detectable mutation but this is likely secondary to low-level circulating free tumor DNA.  PRIOR THERAPY: None   CURRENT THERAPY: Palliative systemic chemotherapy with carboplatin for AUC of 5, Alimta 500 Mg/M2 and Avastin 15 Mg/KG every 3 weeks.  The patient is not a great candidate for immunotherapy because of her history of active systemic lupus erythematosus.  She is status post 6 cycles. Starting from cycle #6, the patient will start maintenance Alimta and Avastin.  INTERVAL HISTORY: Kirsten Williams 50 y.o. female returns to the clinic today for a follow-up visit accompanied by her husband.  The patient is feeling fairly well today without any concerning complaints except for some stomach discomfort after drinking the oral contrast for her restaging CT scan.  She is tolerating her treatment well without any concerning adverse side effects except she has some fatigue though week following treatment.  Starting with this cycle of treatment, she started maintenance treatment. She denies any fever or chills.  She has occasional night sweats which is not new for her. Her appetite has improved but she was initially followed by member the nutritionist team while she was first diagnosed.  She denies any significant shortness of breath or hemoptysis.  She denies any chest pain. She has an occasional mild cough which produces clear sputum.  She sometimes has a sensation of tightness around her diaphragm the first week following treatment.  She has  mild nausea which is controlled with her antiemetic.  She denies any vomiting except once on Friday after drinking her oral contrast. She had constipation and took colace which helped her, but it took 4 days to have a bowel movement.  She generally has chronic diarrhea which is normal for her.  She denies any abnormal bleeding or bruising except for some gingival bleeding if bruising her tooth.  She denies any usual headaches, visual changes, balance changes, or extremity weakness. She has is prone to getting skin boils. She has one in her groin and one on her left shoulder. In the past, she has lanced these herself. She showed her OBGYN last month who recommended hot compresses. She recently had a restaging CT scan performed.  She is here today for evaluation to review her scan results before starting cycle #7.    MEDICAL HISTORY: Past Medical History:  Diagnosis Date   Adenocarcinoma, lung, right ([facility]) 05/03/2021   Kirsten Williams is a 50 y.o. female with a history of lung nodules dating back to 2019 who is referred in consultation with Cyndi Bender, PA-C for assessment and management. She is a former smoker having quit in 2016. 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 She missed her screening CT last year and imaging was ordered last month. CT chest from 02-16-2021 reveals pro   Anxiety    GERD (gastroesophageal reflux disease)    SLE (systemic lupus erythematosus) ([place]) 05/03/2021   SLE (systemic lupus erythematosus) ([facility]) 05/03/2021    ALLERGIES:  is allergic to carboplatin.  MEDICATIONS:  Current Outpatient Medications  Medication Sig Dispense Refill  acetaminophen (TYLENOL) 325 MG tablet Take 975 mg by mouth every 6 (six) hours as needed for moderate pain or headache.     cyclobenzaprine (FLEXERIL) 10 MG tablet Take 10 mg by mouth at bedtime as needed for muscle spasms.     dexamethasone (DECADRON) 4 MG tablet 4 mg p.o. twice daily the day before, day of and  day after the chemotherapy every 3 weeks. 40 tablet 1   dicyclomine (BENTYL) 10 MG capsule Take 10 mg by mouth every 6 (six) hours as needed.     diphenhydrAMINE (BENADRYL) 25 MG tablet Take 25 mg by mouth daily as needed for allergies.     folic acid (FOLVITE) 1 MG tablet Take 1 tablet (1 mg total) by mouth daily. 30 tablet 4   hydrocortisone 1 % lotion Apply 1 application topically 2 (two) times daily. 118 mL 0   hydroxychloroquine (PLAQUENIL) 200 MG tablet Take 200 mg by mouth 2 (two) times daily.     Multiple Vitamins-Minerals (MULTI ADULT GUMMIES) CHEW Chew 2 tablets by mouth daily.     omeprazole (PRILOSEC) 40 MG capsule Take 40 mg by mouth daily as needed (acid reflux).     ondansetron (ZOFRAN) 4 MG tablet Take 8 mg by mouth 2 (two) times daily as needed for vomiting or nausea.     prochlorperazine (COMPAZINE) 10 MG tablet Take 1 tablet (10 mg total) by mouth every 6 (six) hours as needed. 30 tablet 2   rizatriptan (MAXALT-MLT) 10 MG disintegrating tablet Take 10 mg by mouth 2 (two) times daily as needed.     traMADol (ULTRAM) 50 MG tablet Take 1 tablet (50 mg total) by mouth every 6 (six) hours as needed. 30 tablet 0   triamcinolone ointment (KENALOG) 0.5 % Apply topically 3 (three) times daily.     No current facility-administered medications for this visit.    SURGICAL HISTORY:  Past Surgical History:  Procedure Laterality Date   ABDOMINAL HYSTERECTOMY     BRONCHIAL BIOPSY  04/24/2021   Procedure: BRONCHIAL BIOPSIES;  Surgeon: Collene Gobble, MD;  Location: [facility] ENDOSCOPY;  Service: Pulmonary;;   BRONCHIAL BRUSHINGS  04/24/2021   Procedure: BRONCHIAL BRUSHINGS;  Surgeon: Collene Gobble, MD;  Location: [facility] ENDOSCOPY;  Service: Pulmonary;;   BRONCHIAL NEEDLE ASPIRATION BIOPSY  04/24/2021   Procedure: BRONCHIAL NEEDLE ASPIRATION BIOPSIES;  Surgeon: Collene Gobble, MD;  Location: [facility];  Service: Pulmonary;;   BRONCHIAL WASHINGS  04/24/2021   Procedure: BRONCHIAL WASHINGS;   Surgeon: Collene Gobble, MD;  Location: [facility];  Service: Pulmonary;;   CESAREAN SECTION  11/09/1990   DILATION AND CURETTAGE OF UTERUS Bilateral 09/09/1996   FIDUCIAL MARKER PLACEMENT  04/24/2021   Procedure: FIDUCIAL MARKER PLACEMENT;  Surgeon: Collene Gobble, MD;  Location: [facility] ENDOSCOPY;  Service: Pulmonary;;   TONSILLECTOMY  12/10/1977   VIDEO BRONCHOSCOPY WITH ENDOBRONCHIAL NAVIGATION Bilateral 04/24/2021   Procedure: VIDEO BRONCHOSCOPY WITH ENDOBRONCHIAL NAVIGATION;  Surgeon: Collene Gobble, MD;  Location: [facility] ENDOSCOPY;  Service: Pulmonary;  Laterality: Bilateral;    REVIEW OF SYSTEMS:   Review of Systems  Constitutional: Positive for fatigue 1 week following treatment. Negative for appetite change, chills, fever and unexpected weight change.  HENT: Negative for mouth sores, nosebleeds, sore throat and trouble swallowing.   Eyes: Negative for eye problems and icterus. Visual blurring improved.  Respiratory: Positive for mild cough. Negative for hemoptysis, shortness of breath and wheezing.   Cardiovascular: Negative for chest pain and leg swelling.  Gastrointestinal: Positive for  occasional nausea. Vomiting x1 from oral contrast. Mild abdominal discomfort this AM. Positive for constipation (resolved).  Genitourinary: Negative for bladder incontinence, difficulty urinating, dysuria, frequency and hematuria.   Musculoskeletal:  Negative for back pain, gait problem, neck pain and neck stiffness.  Skin: Positive for skin boil on left shoulder and reportedly in groin.  Neurological: Negative for dizziness, extremity weakness, gait problem, headaches, light-headedness and seizures.  Hematological: Negative for adenopathy. Does not bruise/bleed easily.  Psychiatric/Behavioral: Negative for confusion, depression and sleep disturbance. The patient is not nervous/anxious.     PHYSICAL EXAMINATION:  Blood pressure 128/84, pulse 77, temperature 97.8 F (36.6 C), temperature source  Tympanic, resp. rate 19, height 5\' 1"  (1.549 m), weight 116 lb 4.8 oz (52.8 kg), SpO2 100 %.  ECOG PERFORMANCE STATUS: 1  Physical Exam  Constitutional: Oriented to person, place, and time and well-developed, well-nourished, and in no distress.  HENT:  Head: Normocephalic and atraumatic.  Mouth/Throat: Oropharynx is clear and moist. No oropharyngeal exudate.  Eyes: Conjunctivae are normal. Right eye exhibits no discharge. Left eye exhibits no discharge. No scleral icterus.  Neck: Normal range of motion. Neck supple.  Cardiovascular: Normal rate, regular rhythm, normal heart sounds and intact distal pulses.   Pulmonary/Chest: Effort normal and breath sounds normal. No respiratory distress. No wheezes. No rales.  Abdominal: Soft. Bowel sounds are normal. Exhibits no distension and no mass. There is no tenderness.  Musculoskeletal: Normal range of motion. Exhibits no edema.  Lymphadenopathy:    No cervical adenopathy.  Neurological: Alert and oriented to person, place, and time. Exhibits normal muscle tone. Gait normal. Coordination normal.  Skin: Skin is warm and dry. No rash noted. Not diaphoretic. No erythema. No pallor. Positive for soft tissue erythema left shoulder.  Psychiatric: Mood, memory and judgment normal.  Vitals reviewed.  LABORATORY DATA: Lab Results  Component Value Date   WBC 6.9 10/25/2021   HGB 12.3 10/25/2021   HCT 37.9 10/25/2021   MCV 100.8 (H) 10/25/2021   PLT 259 10/25/2021      Chemistry      Component Value Date/Time   NA 138 10/25/2021 0736   NA 140 03/13/2021 0000   K 3.8 10/25/2021 0736   CL 105 10/25/2021 0736   CO2 21 (L) 10/25/2021 0736   BUN 7 10/25/2021 0736   BUN 10 03/13/2021 0000   CREATININE 0.72 10/25/2021 0736   GLU 107 03/13/2021 0000      Component Value Date/Time   CALCIUM 9.4 10/25/2021 0736   ALKPHOS 95 10/25/2021 0736   AST 24 10/25/2021 0736   ALT 38 10/25/2021 0736   BILITOT <0.2 (L) 10/25/2021 0736        RADIOGRAPHIC STUDIES:  CT Chest W Contrast  Result Date: 10/21/2021 CLINICAL DATA:  Metastatic non-small cell lung cancer restaging, ongoing chemotherapy, former smoker EXAM: CT CHEST, ABDOMEN, AND PELVIS WITH CONTRAST TECHNIQUE: Multidetector CT imaging of the chest, abdomen and pelvis was performed following the standard protocol during bolus administration of intravenous contrast. CONTRAST:  15mL OMNIPAQUE IOHEXOL 350 MG/ML SOLN, additional oral enteric COMPARISON:  08/22/2021 FINDINGS: CT CHEST FINDINGS Cardiovascular: Scattered aortic atherosclerosis. Normal heart size. No pericardial effusion. Mediastinum/Nodes: Unchanged prominent mediastinal lymph nodes, largest pretracheal node measuring 1.4 x 1.0 cm (series 1, image 22). Thyroid gland, trachea, and esophagus demonstrate no significant findings. Lungs/Pleura: Minimal paraseptal emphysema. Diffuse bilateral bronchial wall thickening. Multiple irregular subsolid and ground-glass pulmonary nodules throughout the lungs are unchanged, for example a subsolid nodule of the posterior right  apex measuring 1.1 x 1.0 cm (series 3, image 34), a somewhat ill-defined ground-glass nodule of the peripheral left upper lobe measuring 2.1 x 1.3 cm (series 3, image 42), and a subsolid nodule of the superior segment right lower lobe measuring 1.5 x 1.1 cm (series 3, image 57). Biopsy marking clip in the superior segment right lower lobe (series 3, image 64). No pleural effusion or pneumothorax. Musculoskeletal: No chest wall mass or suspicious bone lesions identified. CT ABDOMEN PELVIS FINDINGS Hepatobiliary: No solid liver abnormality is seen. No gallstones, gallbladder wall thickening, or biliary dilatation. Pancreas: Unremarkable. No pancreatic ductal dilatation or surrounding inflammatory changes. Spleen: Normal in size without significant abnormality. Adrenals/Urinary Tract: Stable, benign bilateral adrenal adenomata. Kidneys are normal, without renal calculi,  solid lesion, or hydronephrosis. Bladder is unremarkable. Stomach/Bowel: Stomach is within normal limits. Appendix appears normal. No evidence of bowel wall thickening, distention, or inflammatory changes. Vascular/Lymphatic: Aortic atherosclerosis. No enlarged abdominal or pelvic lymph nodes. Reproductive: Status post hysterectomy. Other: No abdominal wall hernia or abnormality. No abdominopelvic ascites. Musculoskeletal: No acute or significant osseous findings. IMPRESSION: 1. Multiple irregular subsolid and ground-glass pulmonary nodules throughout the lungs are unchanged, and remain suspicious for multifocal adenocarcinoma. 2. Unchanged prominent mediastinal lymph nodes, previously hypermetabolic and suspicious for nodal metastatic disease. 3. No evidence of lymphadenopathy or metastatic disease in the abdomen or pelvis. 4. Stable, benign bilateral adrenal adenomata. 5. Emphysema. Aortic Atherosclerosis (ICD10-I70.0) and Emphysema (ICD10-J43.9). Electronically Signed   By: Delanna Ahmadi M.D.   On: 10/21/2021 08:40   CT Abdomen Pelvis W Contrast  Result Date: 10/21/2021 CLINICAL DATA:  Metastatic non-small cell lung cancer restaging, ongoing chemotherapy, former smoker EXAM: CT CHEST, ABDOMEN, AND PELVIS WITH CONTRAST TECHNIQUE: Multidetector CT imaging of the chest, abdomen and pelvis was performed following the standard protocol during bolus administration of intravenous contrast. CONTRAST:  27mL OMNIPAQUE IOHEXOL 350 MG/ML SOLN, additional oral enteric COMPARISON:  08/22/2021 FINDINGS: CT CHEST FINDINGS Cardiovascular: Scattered aortic atherosclerosis. Normal heart size. No pericardial effusion. Mediastinum/Nodes: Unchanged prominent mediastinal lymph nodes, largest pretracheal node measuring 1.4 x 1.0 cm (series 1, image 22). Thyroid gland, trachea, and esophagus demonstrate no significant findings. Lungs/Pleura: Minimal paraseptal emphysema. Diffuse bilateral bronchial wall thickening. Multiple  irregular subsolid and ground-glass pulmonary nodules throughout the lungs are unchanged, for example a subsolid nodule of the posterior right apex measuring 1.1 x 1.0 cm (series 3, image 34), a somewhat ill-defined ground-glass nodule of the peripheral left upper lobe measuring 2.1 x 1.3 cm (series 3, image 42), and a subsolid nodule of the superior segment right lower lobe measuring 1.5 x 1.1 cm (series 3, image 57). Biopsy marking clip in the superior segment right lower lobe (series 3, image 64). No pleural effusion or pneumothorax. Musculoskeletal: No chest wall mass or suspicious bone lesions identified. CT ABDOMEN PELVIS FINDINGS Hepatobiliary: No solid liver abnormality is seen. No gallstones, gallbladder wall thickening, or biliary dilatation. Pancreas: Unremarkable. No pancreatic ductal dilatation or surrounding inflammatory changes. Spleen: Normal in size without significant abnormality. Adrenals/Urinary Tract: Stable, benign bilateral adrenal adenomata. Kidneys are normal, without renal calculi, solid lesion, or hydronephrosis. Bladder is unremarkable. Stomach/Bowel: Stomach is within normal limits. Appendix appears normal. No evidence of bowel wall thickening, distention, or inflammatory changes. Vascular/Lymphatic: Aortic atherosclerosis. No enlarged abdominal or pelvic lymph nodes. Reproductive: Status post hysterectomy. Other: No abdominal wall hernia or abnormality. No abdominopelvic ascites. Musculoskeletal: No acute or significant osseous findings. IMPRESSION: 1. Multiple irregular subsolid and ground-glass pulmonary nodules throughout the lungs are unchanged,  and remain suspicious for multifocal adenocarcinoma. 2. Unchanged prominent mediastinal lymph nodes, previously hypermetabolic and suspicious for nodal metastatic disease. 3. No evidence of lymphadenopathy or metastatic disease in the abdomen or pelvis. 4. Stable, benign bilateral adrenal adenomata. 5. Emphysema. Aortic Atherosclerosis  (ICD10-I70.0) and Emphysema (ICD10-J43.9). Electronically Signed   By: Delanna Ahmadi M.D.   On: 10/21/2021 08:40     ASSESSMENT/PLAN:  This is a very pleasant 50 year old Caucasian female recently diagnosed with stage IV (T4, N2, M1 a) non-small cell lung cancer, adenocarcinoma presented with multifocal disease involving the right upper lobe, right lower lobe as well as suspicious lower paratracheal lymphadenopathy and groundglass nodules in the left lung diagnosed in May 2022. The patient had molecular studies performed by [facility] and that showed no detectable mutation but this is likely secondary to low-level circulating free tumor DNA.    Since the patient has a history of systemic lupus erythematosus, she is not a candidate for immunotherapy.   The patient is currently undergoing systemic chemotherapy with carboplatin for an AUC 5, Alimta 500 mg per metered square, and and Avastin 1500 mg/kg IV every 3 weeks.  She is status post 6 cycles.  Starting from cycle #7, the patient will start maintenance treatment with Alimta and Avastin.  The patient recently had a restaging CT scan performed.  Dr. Julien Nordmann personally and independently reviewed the scan discussed results with the patient today.  The scan showed no evidence for disease progression. Dr. Julien Nordmann recommends that she proceed with cycle #7 today scheduled.  We will see her back for follow-up visit in 3 weeks for evaluation before starting cycle #8. Hopeful that her side effects of fatigue 1 week after treatment improve now that she is starting maintenance.   Reviewed constipation education with the patient. Advised stool softeners are good to try to prevent constipation; however, if she has not achieved a bowel movement and is acutely constipated, recommend that she take a laxative.   Regarding the skin boils, the patient states she has a history of these. She saw her OBGYN last month who recommended hot compresses. I advised the  patient to see her PCP to see if this would be amenable to other intervention such as I&D. Cautioned her to not do this herself, which she did historically, as it would be an infection risk and it would need to be done with sterile materials and consideration of antibiotics afterwards.    The patient was advised to call immediately if she has any concerning symptoms in the interval. The patient voices understanding of current disease status and treatment options and is in agreement with the current care plan. All questions were answered. The patient knows to call the clinic with any problems, questions or concerns. We can certainly see the patient much sooner if necessary  No orders of the defined types were placed in this encounter.   Saraiya Kozma L Clarrisa Kaylor, PA-C 10/25/21  ADDENDUM: Hematology/Oncology Attending: I had a face-to-face encounter with the patient today.  I reviewed her records, lab, scan and recommended her care plan.  This is a very pleasant 50 years old white female diagnosed with a stage IV (T4, N2, M1 a) non-small cell lung cancer, adenocarcinoma in May 2022 with no actionable mutations because of low circulating free tumor DNA.  The patient also has a history of systemic lupus erythematosus and she is not a great candidate for immunotherapy. She is currently undergoing systemic chemotherapy with carboplatin, Alimta and Avastin status post 6 cycles.  She has been tolerating this treatment well with no concerning adverse effect except for mild fatigue. She had repeat CT scan of the chest, abdomen pelvis performed recently.  I personally and independently reviewed the scan results with the patient and her husband. Her scan showed no concerning findings for disease progression. I recommended for the patient to continue her maintenance treatment with Alimta and Avastin starting from cycle #7. She will come back for follow-up visit in 3 weeks for evaluation before the next cycle of  her treatment. She was advised to call immediately if she has any other concerning symptoms in the interval. The total time spent in the appointment was 30 minutes. Disclaimer: This note was dictated with voice recognition software. Similar sounding words can inadvertently be transcribed and may be missed upon review. Eilleen Kempf, MD 10/25/21

## 2021-10-24 ENCOUNTER — Other Ambulatory Visit: Payer: Self-pay | Admitting: Physician Assistant

## 2021-10-24 DIAGNOSIS — C3491 Malignant neoplasm of unspecified part of right bronchus or lung: Secondary | ICD-10-CM

## 2021-10-25 ENCOUNTER — Inpatient Hospital Stay: Payer: Commercial Managed Care - PPO

## 2021-10-25 ENCOUNTER — Other Ambulatory Visit: Payer: Self-pay

## 2021-10-25 ENCOUNTER — Inpatient Hospital Stay: Payer: Commercial Managed Care - PPO | Admitting: Physician Assistant

## 2021-10-25 VITALS — BP 128/84 | HR 77 | Temp 97.8°F | Resp 19 | Ht 61.0 in | Wt 116.3 lb

## 2021-10-25 DIAGNOSIS — Z5112 Encounter for antineoplastic immunotherapy: Secondary | ICD-10-CM | POA: Diagnosis not present

## 2021-10-25 DIAGNOSIS — C3491 Malignant neoplasm of unspecified part of right bronchus or lung: Secondary | ICD-10-CM

## 2021-10-25 DIAGNOSIS — Z5111 Encounter for antineoplastic chemotherapy: Secondary | ICD-10-CM

## 2021-10-25 LAB — CBC WITH DIFFERENTIAL (CANCER CENTER ONLY)
Abs Immature Granulocytes: 0.03 10*3/uL (ref 0.00–0.07)
Basophils Absolute: 0 10*3/uL (ref 0.0–0.1)
Basophils Relative: 0 %
Eosinophils Absolute: 0 10*3/uL (ref 0.0–0.5)
Eosinophils Relative: 0 %
HCT: 37.9 % (ref 36.0–46.0)
Hemoglobin: 12.3 g/dL (ref 12.0–15.0)
Immature Granulocytes: 0 %
Lymphocytes Relative: 15 %
Lymphs Abs: 1 10*3/uL (ref 0.7–4.0)
MCH: 32.7 pg (ref 26.0–34.0)
MCHC: 32.5 g/dL (ref 30.0–36.0)
MCV: 100.8 fL — ABNORMAL HIGH (ref 80.0–100.0)
Monocytes Absolute: 0.8 10*3/uL (ref 0.1–1.0)
Monocytes Relative: 11 %
Neutro Abs: 5 10*3/uL (ref 1.7–7.7)
Neutrophils Relative %: 74 %
Platelet Count: 259 10*3/uL (ref 150–400)
RBC: 3.76 MIL/uL — ABNORMAL LOW (ref 3.87–5.11)
RDW: 15.6 % — ABNORMAL HIGH (ref 11.5–15.5)
WBC Count: 6.9 10*3/uL (ref 4.0–10.5)
nRBC: 0 % (ref 0.0–0.2)

## 2021-10-25 LAB — CMP (CANCER CENTER ONLY)
ALT: 38 U/L (ref 0–44)
AST: 24 U/L (ref 15–41)
Albumin: 3.9 g/dL (ref 3.5–5.0)
Alkaline Phosphatase: 95 U/L (ref 38–126)
Anion gap: 12 (ref 5–15)
BUN: 7 mg/dL (ref 6–20)
CO2: 21 mmol/L — ABNORMAL LOW (ref 22–32)
Calcium: 9.4 mg/dL (ref 8.9–10.3)
Chloride: 105 mmol/L (ref 98–111)
Creatinine: 0.72 mg/dL (ref 0.44–1.00)
GFR, Estimated: >60 mL/min (ref >60)
Glucose, Bld: 105 mg/dL — ABNORMAL HIGH (ref 70–99)
Potassium: 3.8 mmol/L (ref 3.5–5.1)
Sodium: 138 mmol/L (ref 135–145)
Total Bilirubin: <0.2 mg/dL — ABNORMAL LOW (ref 0.3–1.2)
Total Protein: 7.4 g/dL (ref 6.5–8.1)

## 2021-10-25 LAB — TOTAL PROTEIN, URINE DIPSTICK: Protein, ur: NEGATIVE mg/dL

## 2021-10-25 MED ORDER — PROCHLORPERAZINE MALEATE 10 MG PO TABS
10.0000 mg | ORAL_TABLET | Freq: Once | ORAL | Status: AC
  Administered 2021-10-25: 10 mg via ORAL
  Filled 2021-10-25: qty 1

## 2021-10-25 MED ORDER — SODIUM CHLORIDE 0.9 % IV SOLN
Freq: Once | INTRAVENOUS | Status: AC
Start: 2021-10-25 — End: 2021-10-25

## 2021-10-25 MED ORDER — SODIUM CHLORIDE 0.9 % IV SOLN: 150.0000 mg | Freq: Once | INTRAVENOUS | Status: DC

## 2021-10-25 MED ORDER — SODIUM CHLORIDE 0.9 % IV SOLN
15.0000 mg/kg | Freq: Once | INTRAVENOUS | Status: AC
  Administered 2021-10-25: 800 mg via INTRAVENOUS
  Filled 2021-10-25: qty 32

## 2021-10-25 MED ORDER — PALONOSETRON HCL INJECTION 0.25 MG/5ML: 0.2500 mg | Freq: Once | INTRAVENOUS | Status: DC

## 2021-10-25 MED ORDER — SODIUM CHLORIDE 0.9 % IV SOLN
500.0000 mg/m2 | Freq: Once | INTRAVENOUS | Status: AC
  Administered 2021-10-25: 800 mg via INTRAVENOUS
  Filled 2021-10-25: qty 20

## 2021-10-25 MED ORDER — SODIUM CHLORIDE 0.9 % IV SOLN: 10.0000 mg | Freq: Once | INTRAVENOUS | Status: DC

## 2021-10-25 NOTE — Patient Instructions (Signed)
[facility] ONCOLOGY  Discharge Instructions: Thank you for choosing [facility] to provide your oncology and hematology care.   If you have a lab appointment with the [facility], please go directly to the [facility] and check in at the registration area.   Wear comfortable clothing and clothing appropriate for easy access to any Portacath or PICC line.   We strive to give you quality time with your provider. You may need to reschedule your appointment if you arrive late (15 or more minutes).  Arriving late affects you and other patients whose appointments are after yours.  Also, if you miss three or more appointments without notifying the office, you may be dismissed from the clinic at the provider's discretion.      For prescription refill requests, have your pharmacy contact our office and allow 72 hours for refills to be completed.    Today you received the following chemotherapy and/or immunotherapy agents: Bevacizumab, Alimta      To help prevent nausea and vomiting after your treatment, we encourage you to take your nausea medication as directed.  BELOW ARE SYMPTOMS THAT SHOULD BE REPORTED IMMEDIATELY: *FEVER GREATER THAN 100.4 F (38 C) OR HIGHER *CHILLS OR SWEATING *NAUSEA AND VOMITING THAT IS NOT CONTROLLED WITH YOUR NAUSEA MEDICATION *UNUSUAL SHORTNESS OF BREATH *UNUSUAL BRUISING OR BLEEDING *URINARY PROBLEMS (pain or burning when urinating, or frequent urination) *BOWEL PROBLEMS (unusual diarrhea, constipation, pain near the anus) TENDERNESS IN MOUTH AND THROAT WITH OR WITHOUT PRESENCE OF ULCERS (sore throat, sores in mouth, or a toothache) UNUSUAL RASH, SWELLING OR PAIN  UNUSUAL VAGINAL DISCHARGE OR ITCHING   Items with * indicate a potential emergency and should be followed up as soon as possible or go to the Emergency Department if any problems should occur.  Please show the CHEMOTHERAPY ALERT CARD or IMMUNOTHERAPY ALERT CARD at  check-in to the Emergency Department and triage nurse.  Should you have questions after your visit or need to cancel or reschedule your appointment, please contact [facility]  Dept: [phone]  and follow the prompts.  Office hours are 8:00 a.m. to 4:30 p.m. Monday - Friday. Please note that voicemails left after 4:00 p.m. may not be returned until the following business day.  We are closed weekends and major holidays. You have access to a nurse at all times for urgent questions. Please call the main number to the clinic Dept: [phone] and follow the prompts.   For any non-urgent questions, you may also contact your provider using MyChart. We now offer e-Visits for anyone 57 and older to request care online for non-urgent symptoms. For details visit [email or web address].   Also download the MyChart app! Go to the app store, search "MyChart", open the app, select [facility], and log in with your MyChart username and password.  Due to Covid, a mask is required upon entering the hospital/clinic. If you do not have a mask, one will be given to you upon arrival. For doctor visits, patients may have 1 support person aged 9 or older with them. For treatment visits, patients cannot have anyone with them due to current Covid guidelines and our immunocompromised population.

## 2021-10-25 NOTE — Progress Notes (Signed)
Carbo removed from tx plan; adj premeds.  Discussed w/ Cassie Heilingoepter, PA.  Kennith Center, Pharm.D., CPP 10/25/2021@9 :40 AM

## 2021-11-01 ENCOUNTER — Other Ambulatory Visit: Payer: Commercial Managed Care - PPO

## 2021-11-02 ENCOUNTER — Other Ambulatory Visit: Payer: Self-pay | Admitting: Internal Medicine

## 2021-11-03 ENCOUNTER — Encounter: Payer: Self-pay | Admitting: Internal Medicine

## 2021-11-08 ENCOUNTER — Other Ambulatory Visit: Payer: Commercial Managed Care - PPO

## 2021-11-15 ENCOUNTER — Inpatient Hospital Stay: Payer: Commercial Managed Care - PPO | Admitting: Internal Medicine

## 2021-11-15 ENCOUNTER — Inpatient Hospital Stay: Payer: Commercial Managed Care - PPO

## 2021-11-15 ENCOUNTER — Other Ambulatory Visit: Payer: Self-pay

## 2021-11-15 ENCOUNTER — Inpatient Hospital Stay: Payer: Commercial Managed Care - PPO | Attending: Hematology and Oncology

## 2021-11-15 VITALS — BP 131/75 | HR 92 | Temp 97.3°F | Resp 19 | Ht 61.0 in | Wt 117.7 lb

## 2021-11-15 DIAGNOSIS — M329 Systemic lupus erythematosus, unspecified: Secondary | ICD-10-CM | POA: Insufficient documentation

## 2021-11-15 DIAGNOSIS — Z5111 Encounter for antineoplastic chemotherapy: Secondary | ICD-10-CM | POA: Diagnosis present

## 2021-11-15 DIAGNOSIS — C3411 Malignant neoplasm of upper lobe, right bronchus or lung: Secondary | ICD-10-CM | POA: Insufficient documentation

## 2021-11-15 DIAGNOSIS — Z5112 Encounter for antineoplastic immunotherapy: Secondary | ICD-10-CM | POA: Diagnosis present

## 2021-11-15 DIAGNOSIS — C3491 Malignant neoplasm of unspecified part of right bronchus or lung: Secondary | ICD-10-CM

## 2021-11-15 LAB — CBC WITH DIFFERENTIAL (CANCER CENTER ONLY)
Abs Immature Granulocytes: 0.04 10*3/uL (ref 0.00–0.07)
Basophils Absolute: 0 10*3/uL (ref 0.0–0.1)
Basophils Relative: 0 %
Eosinophils Absolute: 0 10*3/uL (ref 0.0–0.5)
Eosinophils Relative: 0 %
HCT: 35.7 % — ABNORMAL LOW (ref 36.0–46.0)
Hemoglobin: 12.1 g/dL (ref 12.0–15.0)
Immature Granulocytes: 0 %
Lymphocytes Relative: 9 %
Lymphs Abs: 0.9 10*3/uL (ref 0.7–4.0)
MCH: 33.5 pg (ref 26.0–34.0)
MCHC: 33.9 g/dL (ref 30.0–36.0)
MCV: 98.9 fL (ref 80.0–100.0)
Monocytes Absolute: 0.4 10*3/uL (ref 0.1–1.0)
Monocytes Relative: 4 %
Neutro Abs: 8.4 10*3/uL — ABNORMAL HIGH (ref 1.7–7.7)
Neutrophils Relative %: 87 %
Platelet Count: 260 10*3/uL (ref 150–400)
RBC: 3.61 MIL/uL — ABNORMAL LOW (ref 3.87–5.11)
RDW: 15.2 % (ref 11.5–15.5)
WBC Count: 9.7 10*3/uL (ref 4.0–10.5)
nRBC: 0 % (ref 0.0–0.2)

## 2021-11-15 LAB — CMP (CANCER CENTER ONLY)
ALT: 59 U/L — ABNORMAL HIGH (ref 0–44)
AST: 33 U/L (ref 15–41)
Albumin: 4 g/dL (ref 3.5–5.0)
Alkaline Phosphatase: 80 U/L (ref 38–126)
Anion gap: 12 (ref 5–15)
BUN: 10 mg/dL (ref 6–20)
CO2: 24 mmol/L (ref 22–32)
Calcium: 9.4 mg/dL (ref 8.9–10.3)
Chloride: 104 mmol/L (ref 98–111)
Creatinine: 0.83 mg/dL (ref 0.44–1.00)
GFR, Estimated: >60 mL/min (ref >60)
Glucose, Bld: 129 mg/dL — ABNORMAL HIGH (ref 70–99)
Potassium: 3.8 mmol/L (ref 3.5–5.1)
Sodium: 140 mmol/L (ref 135–145)
Total Bilirubin: 0.2 mg/dL — ABNORMAL LOW (ref 0.3–1.2)
Total Protein: 7.3 g/dL (ref 6.5–8.1)

## 2021-11-15 LAB — TOTAL PROTEIN, URINE DIPSTICK: Protein, ur: NEGATIVE mg/dL

## 2021-11-15 MED ORDER — CYANOCOBALAMIN 1000 MCG/ML IJ SOLN
1000.0000 ug | Freq: Once | INTRAMUSCULAR | Status: AC
  Administered 2021-11-15: 1000 ug via INTRAMUSCULAR
  Filled 2021-11-15: qty 1

## 2021-11-15 MED ORDER — PROCHLORPERAZINE MALEATE 10 MG PO TABS
10.0000 mg | ORAL_TABLET | Freq: Once | ORAL | Status: AC
  Administered 2021-11-15: 10 mg via ORAL
  Filled 2021-11-15: qty 1

## 2021-11-15 MED ORDER — SODIUM CHLORIDE 0.9 % IV SOLN: Freq: Once | INTRAVENOUS | Status: AC

## 2021-11-15 MED ORDER — SODIUM CHLORIDE 0.9 % IV SOLN
500.0000 mg/m2 | Freq: Once | INTRAVENOUS | Status: AC
  Administered 2021-11-15: 800 mg via INTRAVENOUS
  Filled 2021-11-15: qty 20

## 2021-11-15 MED ORDER — SODIUM CHLORIDE 0.9 % IV SOLN
15.0000 mg/kg | Freq: Once | INTRAVENOUS | Status: AC
  Administered 2021-11-15: 800 mg via INTRAVENOUS
  Filled 2021-11-15: qty 32

## 2021-11-15 NOTE — Patient Instructions (Signed)
[facility]  Discharge Instructions: Thank you for choosing [facility] to provide your oncology and hematology care.   If you have a lab appointment with the [facility], please go directly to the [facility] and check in at the registration area.   Wear comfortable clothing and clothing appropriate for easy access to any Portacath or PICC line.   We strive to give you quality time with your provider. You may need to reschedule your appointment if you arrive late (15 or more minutes).  Arriving late affects you and other patients whose appointments are after yours.  Also, if you miss three or more appointments without notifying the office, you may be dismissed from the clinic at the provider's discretion.      For prescription refill requests, have your pharmacy contact our office and allow 72 hours for refills to be completed.    Today you received the following chemotherapy and/or immunotherapy agents Bevacizamab, and alimta      To help prevent nausea and vomiting after your treatment, we encourage you to take your nausea medication as directed.  BELOW ARE SYMPTOMS THAT SHOULD BE REPORTED IMMEDIATELY: *FEVER GREATER THAN 100.4 F (38 C) OR HIGHER *CHILLS OR SWEATING *NAUSEA AND VOMITING THAT IS NOT CONTROLLED WITH YOUR NAUSEA MEDICATION *UNUSUAL SHORTNESS OF BREATH *UNUSUAL BRUISING OR BLEEDING *URINARY PROBLEMS (pain or burning when urinating, or frequent urination) *BOWEL PROBLEMS (unusual diarrhea, constipation, pain near the anus) TENDERNESS IN MOUTH AND THROAT WITH OR WITHOUT PRESENCE OF ULCERS (sore throat, sores in mouth, or a toothache) UNUSUAL RASH, SWELLING OR PAIN  UNUSUAL VAGINAL DISCHARGE OR ITCHING   Items with * indicate a potential emergency and should be followed up as soon as possible or go to the Emergency Department if any problems should occur.  Please show the CHEMOTHERAPY ALERT CARD or IMMUNOTHERAPY ALERT CARD at  check-in to the Emergency Department and triage nurse.  Should you have questions after your visit or need to cancel or reschedule your appointment, please contact [facility]  Dept: [phone]  and follow the prompts.  Office hours are 8:00 a.m. to 4:30 p.m. Monday - Friday. Please note that voicemails left after 4:00 p.m. may not be returned until the following business day.  We are closed weekends and major holidays. You have access to a nurse at all times for urgent questions. Please call the main number to the clinic Dept: [phone] and follow the prompts.   For any non-urgent questions, you may also contact your provider using MyChart. We now offer e-Visits for anyone 49 and older to request care online for non-urgent symptoms. For details visit [email or web address].   Also download the MyChart app! Go to the app store, search "MyChart", open the app, select [facility], and log in with your MyChart username and password.  Due to Covid, a mask is required upon entering the hospital/clinic. If you do not have a mask, one will be given to you upon arrival. For doctor visits, patients may have 1 support person aged 29 or older with them. For treatment visits, patients cannot have anyone with them due to current Covid guidelines and our immunocompromised population.

## 2021-11-15 NOTE — Progress Notes (Signed)
[facility] Telephone:([phone]) [phone]   Fax:([phone]) [phone]  OFFICE PROGRESS NOTE  Kirsten Bender, PA-C [place] [address]  DIAGNOSIS:  Stage IV (T4, N2, M1 a) non-small cell lung cancer, adenocarcinoma presented with multifocal disease involving the right upper lobe, right lower lobe as well as suspicious lower paratracheal lymphadenopathy and groundglass nodules in the left lung diagnosed in May 2022. The patient had molecular studies performed by [facility] and that showed no detectable mutation but this is likely secondary to low-level circulating free tumor DNA.  PRIOR THERAPY: None.  CURRENT THERAPY: palliative systemic chemotherapy with carboplatin for AUC of 5, Alimta 500 Mg/M2 and Avastin 15 Mg/KG every 3 weeks.  The patient is not a great candidate for immunotherapy because of her history of active systemic lupus erythematosus.  She is status post 7 cycles.  INTERVAL HISTORY: Kirsten Williams 50 y.o. female returns to the clinic today for follow-up visit.  The patient is feeling fine today with no concerning complaints except for mild rash.  She denied having any current chest pain, shortness of breath, cough or hemoptysis.  She has few days of weakness after her chemotherapy but recovers well.  She has no nausea, vomiting, diarrhea or constipation.  She has no headache or visual changes.  She is here today for evaluation before starting cycle #8 of her treatment.  MEDICAL HISTORY: Past Medical History:  Diagnosis Date   Adenocarcinoma, lung, right ([facility]) 05/03/2021   Kirsten Williams is a 50 y.o. female with a history of lung nodules dating back to 2019 who is referred in consultation with Kirsten Bender, PA-C for assessment and management. She is a former smoker having quit in 2016. 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 She missed her screening CT last year and imaging was ordered last month. CT chest from  02-16-2021 reveals pro   Anxiety    GERD (gastroesophageal reflux disease)    SLE (systemic lupus erythematosus) ([facility]) 05/03/2021   SLE (systemic lupus erythematosus) ([facility]) 05/03/2021    ALLERGIES:  is allergic to carboplatin.  MEDICATIONS:  Current Outpatient Medications  Medication Sig Dispense Refill   acetaminophen (TYLENOL) 325 MG tablet Take 975 mg by mouth every 6 (six) hours as needed for moderate pain or headache.     cyclobenzaprine (FLEXERIL) 10 MG tablet Take 10 mg by mouth at bedtime as needed for muscle spasms.     dexamethasone (DECADRON) 4 MG tablet 4 mg p.o. twice daily the day before, day of and day after the chemotherapy every 3 weeks. 40 tablet 1   dicyclomine (BENTYL) 10 MG capsule Take 10 mg by mouth every 6 (six) hours as needed.     diphenhydrAMINE (BENADRYL) 25 MG tablet Take 25 mg by mouth daily as needed for allergies.     folic acid (FOLVITE) 1 MG tablet TAKE 1 TABLET(1 MG) BY MOUTH DAILY 30 tablet 4   hydrocortisone 1 % lotion Apply 1 application topically 2 (two) times daily. 118 mL 0   hydroxychloroquine (PLAQUENIL) 200 MG tablet Take 200 mg by mouth 2 (two) times daily.     Multiple Vitamins-Minerals (MULTI ADULT GUMMIES) CHEW Chew 2 tablets by mouth daily.     omeprazole (PRILOSEC) 40 MG capsule Take 40 mg by mouth daily as needed (acid reflux).     ondansetron (ZOFRAN) 4 MG tablet Take 8 mg by mouth 2 (two) times daily as needed for vomiting or  nausea.     prochlorperazine (COMPAZINE) 10 MG tablet Take 1 tablet (10 mg total) by mouth every 6 (six) hours as needed. 30 tablet 2   rizatriptan (MAXALT-MLT) 10 MG disintegrating tablet Take 10 mg by mouth 2 (two) times daily as needed.     traMADol (ULTRAM) 50 MG tablet Take 1 tablet (50 mg total) by mouth every 6 (six) hours as needed. 30 tablet 0   triamcinolone ointment (KENALOG) 0.5 % Apply topically 3 (three) times daily.     No current facility-administered medications for this visit.    SURGICAL  HISTORY:  Past Surgical History:  Procedure Laterality Date   ABDOMINAL HYSTERECTOMY     BRONCHIAL BIOPSY  04/24/2021   Procedure: BRONCHIAL BIOPSIES;  Surgeon: Collene Gobble, MD;  Location: [facility] ENDOSCOPY;  Service: Pulmonary;;   BRONCHIAL BRUSHINGS  04/24/2021   Procedure: BRONCHIAL BRUSHINGS;  Surgeon: Collene Gobble, MD;  Location: [facility] ENDOSCOPY;  Service: Pulmonary;;   BRONCHIAL NEEDLE ASPIRATION BIOPSY  04/24/2021   Procedure: BRONCHIAL NEEDLE ASPIRATION BIOPSIES;  Surgeon: Collene Gobble, MD;  Location: [facility];  Service: Pulmonary;;   BRONCHIAL WASHINGS  04/24/2021   Procedure: BRONCHIAL WASHINGS;  Surgeon: Collene Gobble, MD;  Location: [facility];  Service: Pulmonary;;   CESAREAN SECTION  11/09/1990   DILATION AND CURETTAGE OF UTERUS Bilateral 09/09/1996   FIDUCIAL MARKER PLACEMENT  04/24/2021   Procedure: FIDUCIAL MARKER PLACEMENT;  Surgeon: Collene Gobble, MD;  Location: [facility] ENDOSCOPY;  Service: Pulmonary;;   TONSILLECTOMY  12/10/1977   VIDEO BRONCHOSCOPY WITH ENDOBRONCHIAL NAVIGATION Bilateral 04/24/2021   Procedure: VIDEO BRONCHOSCOPY WITH ENDOBRONCHIAL NAVIGATION;  Surgeon: Collene Gobble, MD;  Location: [facility] ENDOSCOPY;  Service: Pulmonary;  Laterality: Bilateral;    REVIEW OF SYSTEMS:  A comprehensive review of systems was negative except for: Constitutional: positive for fatigue   PHYSICAL EXAMINATION: General appearance: alert, cooperative, and no distress Head: Normocephalic, without obvious abnormality, atraumatic Neck: no adenopathy, no JVD, supple, symmetrical, trachea midline, and thyroid not enlarged, symmetric, no tenderness/mass/nodules Lymph nodes: Cervical, supraclavicular, and axillary nodes normal. Resp: clear to auscultation bilaterally Back: symmetric, no curvature. ROM normal. No CVA tenderness. Cardio: regular rate and rhythm, S1, S2 normal, no murmur, click, rub or gallop GI: soft, non-tender; bowel sounds normal; no masses,  no  organomegaly Extremities: extremities normal, atraumatic, no cyanosis or edema  ECOG PERFORMANCE STATUS: 1 - Symptomatic but completely ambulatory  Blood pressure 131/75, pulse 92, temperature (!) 97.3 F (36.3 C), temperature source Tympanic, resp. rate 19, height 5\' 1"  (1.549 m), weight 117 lb 11.2 oz (53.4 kg), SpO2 99 %.  LABORATORY DATA: Lab Results  Component Value Date   WBC 9.7 11/15/2021   HGB 12.1 11/15/2021   HCT 35.7 (L) 11/15/2021   MCV 98.9 11/15/2021   PLT 260 11/15/2021      Chemistry      Component Value Date/Time   NA 138 10/25/2021 0736   NA 140 03/13/2021 0000   K 3.8 10/25/2021 0736   CL 105 10/25/2021 0736   CO2 21 (L) 10/25/2021 0736   BUN 7 10/25/2021 0736   BUN 10 03/13/2021 0000   CREATININE 0.72 10/25/2021 0736   GLU 107 03/13/2021 0000      Component Value Date/Time   CALCIUM 9.4 10/25/2021 0736   ALKPHOS 95 10/25/2021 0736   AST 24 10/25/2021 0736   ALT 38 10/25/2021 0736   BILITOT <0.2 (L) 10/25/2021 0736       RADIOGRAPHIC STUDIES: CT Chest  W Contrast  Result Date: 10/21/2021 CLINICAL DATA:  Metastatic non-small cell lung cancer restaging, ongoing chemotherapy, former smoker EXAM: CT CHEST, ABDOMEN, AND PELVIS WITH CONTRAST TECHNIQUE: Multidetector CT imaging of the chest, abdomen and pelvis was performed following the standard protocol during bolus administration of intravenous contrast. CONTRAST:  55mL OMNIPAQUE IOHEXOL 350 MG/ML SOLN, additional oral enteric COMPARISON:  08/22/2021 FINDINGS: CT CHEST FINDINGS Cardiovascular: Scattered aortic atherosclerosis. Normal heart size. No pericardial effusion. Mediastinum/Nodes: Unchanged prominent mediastinal lymph nodes, largest pretracheal node measuring 1.4 x 1.0 cm (series 1, image 22). Thyroid gland, trachea, and esophagus demonstrate no significant findings. Lungs/Pleura: Minimal paraseptal emphysema. Diffuse bilateral bronchial wall thickening. Multiple irregular subsolid and ground-glass  pulmonary nodules throughout the lungs are unchanged, for example a subsolid nodule of the posterior right apex measuring 1.1 x 1.0 cm (series 3, image 34), a somewhat ill-defined ground-glass nodule of the peripheral left upper lobe measuring 2.1 x 1.3 cm (series 3, image 42), and a subsolid nodule of the superior segment right lower lobe measuring 1.5 x 1.1 cm (series 3, image 57). Biopsy marking clip in the superior segment right lower lobe (series 3, image 64). No pleural effusion or pneumothorax. Musculoskeletal: No chest wall mass or suspicious bone lesions identified. CT ABDOMEN PELVIS FINDINGS Hepatobiliary: No solid liver abnormality is seen. No gallstones, gallbladder wall thickening, or biliary dilatation. Pancreas: Unremarkable. No pancreatic ductal dilatation or surrounding inflammatory changes. Spleen: Normal in size without significant abnormality. Adrenals/Urinary Tract: Stable, benign bilateral adrenal adenomata. Kidneys are normal, without renal calculi, solid lesion, or hydronephrosis. Bladder is unremarkable. Stomach/Bowel: Stomach is within normal limits. Appendix appears normal. No evidence of bowel wall thickening, distention, or inflammatory changes. Vascular/Lymphatic: Aortic atherosclerosis. No enlarged abdominal or pelvic lymph nodes. Reproductive: Status post hysterectomy. Other: No abdominal wall hernia or abnormality. No abdominopelvic ascites. Musculoskeletal: No acute or significant osseous findings. IMPRESSION: 1. Multiple irregular subsolid and ground-glass pulmonary nodules throughout the lungs are unchanged, and remain suspicious for multifocal adenocarcinoma. 2. Unchanged prominent mediastinal lymph nodes, previously hypermetabolic and suspicious for nodal metastatic disease. 3. No evidence of lymphadenopathy or metastatic disease in the abdomen or pelvis. 4. Stable, benign bilateral adrenal adenomata. 5. Emphysema. Aortic Atherosclerosis (ICD10-I70.0) and Emphysema  (ICD10-J43.9). Electronically Signed   By: Delanna Ahmadi M.D.   On: 10/21/2021 08:40   CT Abdomen Pelvis W Contrast  Result Date: 10/21/2021 CLINICAL DATA:  Metastatic non-small cell lung cancer restaging, ongoing chemotherapy, former smoker EXAM: CT CHEST, ABDOMEN, AND PELVIS WITH CONTRAST TECHNIQUE: Multidetector CT imaging of the chest, abdomen and pelvis was performed following the standard protocol during bolus administration of intravenous contrast. CONTRAST:  48mL OMNIPAQUE IOHEXOL 350 MG/ML SOLN, additional oral enteric COMPARISON:  08/22/2021 FINDINGS: CT CHEST FINDINGS Cardiovascular: Scattered aortic atherosclerosis. Normal heart size. No pericardial effusion. Mediastinum/Nodes: Unchanged prominent mediastinal lymph nodes, largest pretracheal node measuring 1.4 x 1.0 cm (series 1, image 22). Thyroid gland, trachea, and esophagus demonstrate no significant findings. Lungs/Pleura: Minimal paraseptal emphysema. Diffuse bilateral bronchial wall thickening. Multiple irregular subsolid and ground-glass pulmonary nodules throughout the lungs are unchanged, for example a subsolid nodule of the posterior right apex measuring 1.1 x 1.0 cm (series 3, image 34), a somewhat ill-defined ground-glass nodule of the peripheral left upper lobe measuring 2.1 x 1.3 cm (series 3, image 42), and a subsolid nodule of the superior segment right lower lobe measuring 1.5 x 1.1 cm (series 3, image 57). Biopsy marking clip in the superior segment right lower lobe (series 3, image 64). No  pleural effusion or pneumothorax. Musculoskeletal: No chest wall mass or suspicious bone lesions identified. CT ABDOMEN PELVIS FINDINGS Hepatobiliary: No solid liver abnormality is seen. No gallstones, gallbladder wall thickening, or biliary dilatation. Pancreas: Unremarkable. No pancreatic ductal dilatation or surrounding inflammatory changes. Spleen: Normal in size without significant abnormality. Adrenals/Urinary Tract: Stable, benign  bilateral adrenal adenomata. Kidneys are normal, without renal calculi, solid lesion, or hydronephrosis. Bladder is unremarkable. Stomach/Bowel: Stomach is within normal limits. Appendix appears normal. No evidence of bowel wall thickening, distention, or inflammatory changes. Vascular/Lymphatic: Aortic atherosclerosis. No enlarged abdominal or pelvic lymph nodes. Reproductive: Status post hysterectomy. Other: No abdominal wall hernia or abnormality. No abdominopelvic ascites. Musculoskeletal: No acute or significant osseous findings. IMPRESSION: 1. Multiple irregular subsolid and ground-glass pulmonary nodules throughout the lungs are unchanged, and remain suspicious for multifocal adenocarcinoma. 2. Unchanged prominent mediastinal lymph nodes, previously hypermetabolic and suspicious for nodal metastatic disease. 3. No evidence of lymphadenopathy or metastatic disease in the abdomen or pelvis. 4. Stable, benign bilateral adrenal adenomata. 5. Emphysema. Aortic Atherosclerosis (ICD10-I70.0) and Emphysema (ICD10-J43.9). Electronically Signed   By: Delanna Ahmadi M.D.   On: 10/21/2021 08:40    ASSESSMENT AND PLAN:  This is a very pleasant 50 years old white female recently diagnosed with a stage IV non-small cell lung cancer, adenocarcinoma with no actionable mutation diagnosed in May 2022.  The patient has also history of systemic lupus erythematosus and she is not a candidate for immunotherapy. She is currently undergoing systemic chemotherapy with carboplatin for AUC of 5, Alimta 500 Mg/M2 and Avastin 15 Mg/KG every 3 weeks status post 7 cycles.  Starting from cycle #7 the patient is on maintenance treatment with Alimta and Avastin every 3 weeks. She continues to tolerate her treatment fairly well. I recommended for her to proceed with cycle #8 today as planned. I will see her back for follow-up visit in 3 weeks for evaluation before the next cycle of her treatment. For the skin rash on the chest and back,  this has improved with the hydrocortisone 1% cream. For pain management she will continue her current treatment with hydrocodone on as-needed basis. She was advised to call immediately if she has any other concerning symptoms in the interval. The patient voices understanding of current disease status and treatment options and is in agreement with the current care plan.  All questions were answered. The patient knows to call the clinic with any problems, questions or concerns. We can certainly see the patient much sooner if necessary.  Disclaimer: This note was dictated with voice recognition software. Similar sounding words can inadvertently be transcribed and may not be corrected upon review.

## 2021-11-22 ENCOUNTER — Other Ambulatory Visit: Payer: Commercial Managed Care - PPO

## 2021-11-29 ENCOUNTER — Other Ambulatory Visit: Payer: Commercial Managed Care - PPO

## 2021-11-29 NOTE — Progress Notes (Signed)
[facility] OFFICE PROGRESS NOTE  Kirsten Bender, PA-C [place] [address]  DIAGNOSIS: Stage IV (T4, N2, M1 a) non-small cell lung cancer, adenocarcinoma presented with multifocal disease involving the right upper lobe, right lower lobe as well as suspicious lower paratracheal lymphadenopathy and groundglass nodules in the left lung diagnosed in May 2022. The patient had molecular studies performed by [facility] and that showed no detectable mutation but this is likely secondary to low-level circulating free tumor DNA.  PRIOR THERAPY: None  CURRENT THERAPY: Palliative systemic chemotherapy with carboplatin for AUC of 5, Alimta 500 Mg/M2 and Avastin 15 Mg/KG every 3 weeks.  The patient is not a great candidate for immunotherapy because of her history of active systemic lupus erythematosus.  She is status post 8 cycles. Starting from cycle #6, the patient will start maintenance Alimta and Avastin.  INTERVAL HISTORY: Kirsten Williams 50 y.o. female returns to the clinic today for a follow-up visit. The patient is feeling fairly well today without any concerning complaints. She is tolerating her treatment well without any concerning adverse side effects except she has some fatigue though week following treatment.  She denies any fever or chills.  She has occasional night sweats which is not new for her about 2-3 days after her treatment. Her appetite has improved but she was initially followed by member the nutritionist team while she was first diagnosed.  She denies any significant shortness of breath or hemoptysis.  She denies any chest pain. She has an occasional mild cough which produces clear sputum.  She sometimes has a sensation of tightness around her diaphragm the first week following treatment.  She has mild nausea which is controlled with her antiemetic. She denies vomiting. She generally has chronic diarrhea which is normal for her.  She denies any abnormal  bleeding or bruising except for some gingival bleeding if bruising her tooth.  She denies any usual headaches balance changes, or extremity weakness. She recently had a follow up with her eye doctor who recommended eyedrops.  She is here today for evaluation to review her scan results before starting cycle #9.   MEDICAL HISTORY: Past Medical History:  Diagnosis Date   Adenocarcinoma, lung, right ([facility]) 05/03/2021   Kirsten Williams is a 50 y.o. female with a history of lung nodules dating back to 2019 who is referred in consultation with Kirsten Bender, PA-C for assessment and management. She is a former smoker having quit in 2016. 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 She missed her screening CT last year and imaging was ordered last month. CT chest from 02-16-2021 reveals pro   Anxiety    GERD (gastroesophageal reflux disease)    SLE (systemic lupus erythematosus) ([facility]) 05/03/2021   SLE (systemic lupus erythematosus) ([facility]) 05/03/2021    ALLERGIES:  is allergic to carboplatin.  MEDICATIONS:  Current Outpatient Medications  Medication Sig Dispense Refill   oxyCODONE-acetaminophen (PERCOCET/ROXICET) 5-325 MG tablet Take 1 tablet by mouth every 8 (eight) hours as needed for severe pain. 30 tablet 0   acetaminophen (TYLENOL) 325 MG tablet Take 975 mg by mouth every 6 (six) hours as needed for moderate pain or headache.     cyclobenzaprine (FLEXERIL) 10 MG tablet Take 10 mg by mouth at bedtime as needed for muscle spasms. (Patient not taking: Reported on 11/15/2021)     dexamethasone (DECADRON) 4 MG tablet 4 mg p.o. twice daily the day before, day of and  day after the chemotherapy every 3 weeks. 40 tablet 1   dicyclomine (BENTYL) 10 MG capsule Take 10 mg by mouth every 6 (six) hours as needed. (Patient not taking: Reported on 11/15/2021)     diphenhydrAMINE (BENADRYL) 25 MG tablet Take 25 mg by mouth daily as needed for allergies.     folic acid (FOLVITE) 1 MG tablet  TAKE 1 TABLET(1 MG) BY MOUTH DAILY 30 tablet 4   hydrocortisone 1 % lotion Apply 1 application topically 2 (two) times daily. (Patient not taking: Reported on 11/15/2021) 118 mL 0   hydroxychloroquine (PLAQUENIL) 200 MG tablet Take 200 mg by mouth 2 (two) times daily.     Multiple Vitamins-Minerals (MULTI ADULT GUMMIES) CHEW Chew 2 tablets by mouth daily.     omeprazole (PRILOSEC) 40 MG capsule Take 40 mg by mouth daily as needed (acid reflux). (Patient not taking: Reported on 11/15/2021)     ondansetron (ZOFRAN) 4 MG tablet Take 8 mg by mouth 2 (two) times daily as needed for vomiting or nausea. (Patient not taking: Reported on 11/15/2021)     prochlorperazine (COMPAZINE) 10 MG tablet Take 1 tablet (10 mg total) by mouth every 6 (six) hours as needed. 30 tablet 2   rizatriptan (MAXALT-MLT) 10 MG disintegrating tablet Take 10 mg by mouth 2 (two) times daily as needed.     triamcinolone ointment (KENALOG) 0.5 % Apply topically 3 (three) times daily.     No current facility-administered medications for this visit.    SURGICAL HISTORY:  Past Surgical History:  Procedure Laterality Date   ABDOMINAL HYSTERECTOMY     BRONCHIAL BIOPSY  04/24/2021   Procedure: BRONCHIAL BIOPSIES;  Surgeon: Collene Gobble, MD;  Location: [facility] ENDOSCOPY;  Service: Pulmonary;;   BRONCHIAL BRUSHINGS  04/24/2021   Procedure: BRONCHIAL BRUSHINGS;  Surgeon: Collene Gobble, MD;  Location: [facility] ENDOSCOPY;  Service: Pulmonary;;   BRONCHIAL NEEDLE ASPIRATION BIOPSY  04/24/2021   Procedure: BRONCHIAL NEEDLE ASPIRATION BIOPSIES;  Surgeon: Collene Gobble, MD;  Location: [facility];  Service: Pulmonary;;   BRONCHIAL WASHINGS  04/24/2021   Procedure: BRONCHIAL WASHINGS;  Surgeon: Collene Gobble, MD;  Location: [facility];  Service: Pulmonary;;   CESAREAN SECTION  11/09/1990   DILATION AND CURETTAGE OF UTERUS Bilateral 09/09/1996   FIDUCIAL MARKER PLACEMENT  04/24/2021   Procedure: FIDUCIAL MARKER PLACEMENT;  Surgeon: Collene Gobble, MD;  Location[facility]COPY;  Service: Pulmonary;;   TONSILLECTOMY  12/10/1977   VIDEO BRONCHOSCOPY WITH ENDOBRONCHIAL NAVIGATION Bilateral 04/24/2021   Procedure: VIDEO BRONCHOSCOPY WITH ENDOBRONCHIAL NAVIGATION;  Surgeon: Collene Gobble, MD;  Location: [facility] ENDOSCOPY;  Service: Pulmonary;  Laterality: Bilateral;    REVIEW OF SYSTEMS:   Constitutional: Positive for fatigue 1 week following treatment. Negative for appetite change, chills, fever and unexpected weight change.  HENT: Negative for mouth sores, nosebleeds, sore throat and trouble swallowing.   Eyes: Negative for eye problems and icterus.  Respiratory: Positive for mild cough. Negative for hemoptysis, shortness of breath and wheezing.   Cardiovascular: Negative for chest pain and leg swelling.  Gastrointestinal: Positive for occasional nausea. Positive for chronic diarrhea. Genitourinary: Negative for bladder incontinence, difficulty urinating, dysuria, frequency and hematuria.   Musculoskeletal:  Negative for back pain, gait problem, neck pain and neck stiffness.  Skin: Positive for skin boil on left shoulder and reportedly in groin.  Neurological: Negative for dizziness, extremity weakness, gait problem, headaches, light-headedness and seizures.  Hematological: Negative for adenopathy. Does not bruise/bleed easily.  Psychiatric/Behavioral: Negative for  confusion, depression and sleep disturbance. The patient is not nervous/anxious.    PHYSICAL EXAMINATION:  Blood pressure (!) 149/69, pulse 82, temperature (!) 97 F (36.1 C), temperature source Tympanic, resp. rate 19, height 5\' 1"  (1.549 m), weight 116 lb 1.6 oz (52.7 kg), SpO2 100 %.  ECOG PERFORMANCE STATUS: 1  Physical Exam  Constitutional: Oriented to person, place, and time and well-developed, well-nourished, and in no distress.  HENT:  Head: Normocephalic and atraumatic.  Mouth/Throat: Oropharynx is clear and moist. No oropharyngeal exudate.  Eyes: Conjunctivae are  normal. Right eye exhibits no discharge. Left eye exhibits no discharge. No scleral icterus.  Neck: Normal range of motion. Neck supple.  Cardiovascular: Normal rate, regular rhythm, normal heart sounds and intact distal pulses.   Pulmonary/Chest: Effort normal and breath sounds normal. No respiratory distress. No wheezes. No rales.  Abdominal: Soft. Bowel sounds are normal. Exhibits no distension and no mass. There is no tenderness.  Musculoskeletal: Normal range of motion. Exhibits no edema.  Lymphadenopathy:    No cervical adenopathy.  Neurological: Alert and oriented to person, place, and time. Exhibits normal muscle tone. Gait normal. Coordination normal.  Skin: Skin is warm and dry. No rash noted. Not diaphoretic. No erythema. No pallor. Positive for soft tissue erythema left shoulder.  Psychiatric: Mood, memory and judgment normal.  Vitals reviewed.  LABORATORY DATA: Lab Results  Component Value Date   WBC 12.2 (H) 12/06/2021   HGB 13.7 12/06/2021   HCT 42.5 12/06/2021   MCV 100.5 (H) 12/06/2021   PLT 344 12/06/2021      Chemistry      Component Value Date/Time   NA 140 11/15/2021 1255   NA 140 03/13/2021 0000   K 3.8 11/15/2021 1255   CL 104 11/15/2021 1255   CO2 24 11/15/2021 1255   BUN 10 11/15/2021 1255   BUN 10 03/13/2021 0000   CREATININE 0.83 11/15/2021 1255   GLU 107 03/13/2021 0000      Component Value Date/Time   CALCIUM 9.4 11/15/2021 1255   ALKPHOS 80 11/15/2021 1255   AST 33 11/15/2021 1255   ALT 59 (H) 11/15/2021 1255   BILITOT 0.2 (L) 11/15/2021 1255       RADIOGRAPHIC STUDIES:  No results found.   ASSESSMENT/PLAN:  This is a very pleasant 50 year old Caucasian female recently diagnosed with stage IV (T4, N2, M1 a) non-small cell lung cancer, adenocarcinoma presented with multifocal disease involving the right upper lobe, right lower lobe as well as suspicious lower paratracheal lymphadenopathy and groundglass nodules in the left lung  diagnosed in May 2022. The patient had molecular studies performed by [facility] and that showed no detectable mutation but this is likely secondary to low-level circulating free tumor DNA.    Since the patient has a history of systemic lupus erythematosus, she is not a candidate for immunotherapy.   The patient is currently undergoing systemic chemotherapy with carboplatin for an AUC 5, Alimta 500 mg per metered square, and and Avastin 1500 mg/kg IV every 3 weeks.  She is status post 8 cycles.  Starting from cycle #7, the patient will start maintenance treatment with Alimta and Avastin.  Labs were reviewed. Recommend that she proceed with cycle #9 today as scheduled.   We will see her back for a follow up visit in 3 weeks for evaluation before starting cycle #10.   I will arrange for a restaging CT scan prior to starting her next cycle of treatment.   The patient was advised  to call immediately if she has any concerning symptoms in the interval. The patient voices understanding of current disease status and treatment options and is in agreement with the current care plan. All questions were answered. The patient knows to call the clinic with any problems, questions or concerns. We can certainly see the patient much sooner if necessary     Orders Placed This Encounter  Procedures   CT Chest W Contrast    Standing Status:   Future    Standing Expiration Date:   12/06/2022    Order Specific Question:   If indicated for the ordered procedure, I authorize the administration of contrast media per Radiology protocol    Answer:   Yes    Order Specific Question:   Is patient pregnant?    Answer:   No    Order Specific Question:   Preferred imaging location?    Answer:   [facility]   CT Abdomen Pelvis W Contrast    Standing Status:   Future    Standing Expiration Date:   12/06/2022    Order Specific Question:   If indicated for the ordered procedure, I authorize the  administration of contrast media per Radiology protocol    Answer:   Yes    Order Specific Question:   Is patient pregnant?    Answer:   No    Order Specific Question:   Preferred imaging location?    Answer:   [facility]    Order Specific Question:   Is Oral Contrast requested for this exam?    Answer:   Yes, Per Radiology protocol   Total Protein, Urine dipstick    Standing Status:   Standing    Number of Occurrences:   20    Standing Expiration Date:   12/06/2022     The total time spent in the appointment was 20-29 minutes.  Donye Dauenhauer L Azilee Pirro, PA-C 12/06/21

## 2021-12-06 ENCOUNTER — Other Ambulatory Visit: Payer: Self-pay

## 2021-12-06 ENCOUNTER — Inpatient Hospital Stay: Payer: Commercial Managed Care - PPO | Admitting: Physician Assistant

## 2021-12-06 ENCOUNTER — Inpatient Hospital Stay: Payer: Commercial Managed Care - PPO

## 2021-12-06 VITALS — BP 140/82

## 2021-12-06 VITALS — BP 149/69 | HR 82 | Temp 97.0°F | Resp 19 | Ht 61.0 in | Wt 116.1 lb

## 2021-12-06 DIAGNOSIS — C3491 Malignant neoplasm of unspecified part of right bronchus or lung: Secondary | ICD-10-CM

## 2021-12-06 DIAGNOSIS — G893 Neoplasm related pain (acute) (chronic): Secondary | ICD-10-CM

## 2021-12-06 DIAGNOSIS — Z5112 Encounter for antineoplastic immunotherapy: Secondary | ICD-10-CM | POA: Diagnosis not present

## 2021-12-06 LAB — CBC WITH DIFFERENTIAL (CANCER CENTER ONLY)
Abs Immature Granulocytes: 0.04 10*3/uL (ref 0.00–0.07)
Basophils Absolute: 0 10*3/uL (ref 0.0–0.1)
Basophils Relative: 0 %
Eosinophils Absolute: 0 10*3/uL (ref 0.0–0.5)
Eosinophils Relative: 0 %
HCT: 42.5 % (ref 36.0–46.0)
Hemoglobin: 13.7 g/dL (ref 12.0–15.0)
Immature Granulocytes: 0 %
Lymphocytes Relative: 8 %
Lymphs Abs: 1 10*3/uL (ref 0.7–4.0)
MCH: 32.4 pg (ref 26.0–34.0)
MCHC: 32.2 g/dL (ref 30.0–36.0)
MCV: 100.5 fL — ABNORMAL HIGH (ref 80.0–100.0)
Monocytes Absolute: 0.6 10*3/uL (ref 0.1–1.0)
Monocytes Relative: 5 %
Neutro Abs: 10.5 10*3/uL — ABNORMAL HIGH (ref 1.7–7.7)
Neutrophils Relative %: 87 %
Platelet Count: 344 10*3/uL (ref 150–400)
RBC: 4.23 MIL/uL (ref 3.87–5.11)
RDW: 14.8 % (ref 11.5–15.5)
WBC Count: 12.2 10*3/uL — ABNORMAL HIGH (ref 4.0–10.5)
nRBC: 0 % (ref 0.0–0.2)

## 2021-12-06 LAB — CMP (CANCER CENTER ONLY)
ALT: 61 U/L — ABNORMAL HIGH (ref 0–44)
AST: 28 U/L (ref 15–41)
Albumin: 4.5 g/dL (ref 3.5–5.0)
Alkaline Phosphatase: 107 U/L (ref 38–126)
Anion gap: 11 (ref 5–15)
BUN: 10 mg/dL (ref 6–20)
CO2: 24 mmol/L (ref 22–32)
Calcium: 10 mg/dL (ref 8.9–10.3)
Chloride: 103 mmol/L (ref 98–111)
Creatinine: 0.71 mg/dL (ref 0.44–1.00)
GFR, Estimated: >60 mL/min (ref >60)
Glucose, Bld: 159 mg/dL — ABNORMAL HIGH (ref 70–99)
Potassium: 3.6 mmol/L (ref 3.5–5.1)
Sodium: 138 mmol/L (ref 135–145)
Total Bilirubin: 0.3 mg/dL (ref 0.3–1.2)
Total Protein: 8 g/dL (ref 6.5–8.1)

## 2021-12-06 LAB — TOTAL PROTEIN, URINE DIPSTICK: Protein, ur: NEGATIVE mg/dL

## 2021-12-06 MED ORDER — SODIUM CHLORIDE 0.9 % IV SOLN
500.0000 mg/m2 | Freq: Once | INTRAVENOUS | Status: AC
  Administered 2021-12-06: 11:00:00 800 mg via INTRAVENOUS
  Filled 2021-12-06: qty 20

## 2021-12-06 MED ORDER — SODIUM CHLORIDE 0.9 % IV SOLN: Freq: Once | INTRAVENOUS | Status: AC

## 2021-12-06 MED ORDER — SODIUM CHLORIDE 0.9 % IV SOLN
15.0000 mg/kg | Freq: Once | INTRAVENOUS | Status: AC
  Administered 2021-12-06: 11:00:00 800 mg via INTRAVENOUS
  Filled 2021-12-06: qty 32

## 2021-12-06 MED ORDER — PROCHLORPERAZINE MALEATE 10 MG PO TABS
10.0000 mg | ORAL_TABLET | Freq: Once | ORAL | Status: AC
  Administered 2021-12-06: 10:00:00 10 mg via ORAL
  Filled 2021-12-06: qty 1

## 2021-12-06 MED ORDER — OXYCODONE-ACETAMINOPHEN 5-325 MG PO TABS: 1.0000 | ORAL_TABLET | Freq: Three times a day (TID) | ORAL | 0 refills | Status: DC | PRN

## 2021-12-06 NOTE — Patient Instructions (Signed)
[facility] ONCOLOGY   Discharge Instructions: Thank you for choosing [facility] to provide your oncology and hematology care.   If you have a lab appointment with the [facility], please go directly to the [facility] and check in at the registration area.   Wear comfortable clothing and clothing appropriate for easy access to any Portacath or PICC line.   We strive to give you quality time with your provider. You may need to reschedule your appointment if you arrive late (15 or more minutes).  Arriving late affects you and other patients whose appointments are after yours.  Also, if you miss three or more appointments without notifying the office, you may be dismissed from the clinic at the providers discretion.      For prescription refill requests, have your pharmacy contact our office and allow 72 hours for refills to be completed.    Today you received the following chemotherapy and/or immunotherapy agents: Bevacizumab (Avastin) and Pemetrexed (Alimta)      To help prevent nausea and vomiting after your treatment, we encourage you to take your nausea medication as directed.  BELOW ARE SYMPTOMS THAT SHOULD BE REPORTED IMMEDIATELY: *FEVER GREATER THAN 100.4 F (38 C) OR HIGHER *CHILLS OR SWEATING *NAUSEA AND VOMITING THAT IS NOT CONTROLLED WITH YOUR NAUSEA MEDICATION *UNUSUAL SHORTNESS OF BREATH *UNUSUAL BRUISING OR BLEEDING *URINARY PROBLEMS (pain or burning when urinating, or frequent urination) *BOWEL PROBLEMS (unusual diarrhea, constipation, pain near the anus) TENDERNESS IN MOUTH AND THROAT WITH OR WITHOUT PRESENCE OF ULCERS (sore throat, sores in mouth, or a toothache) UNUSUAL RASH, SWELLING OR PAIN  UNUSUAL VAGINAL DISCHARGE OR ITCHING   Items with * indicate a potential emergency and should be followed up as soon as possible or go to the Emergency Department if any problems should occur.  Please show the CHEMOTHERAPY ALERT CARD or  IMMUNOTHERAPY ALERT CARD at check-in to the Emergency Department and triage nurse.  Should you have questions after your visit or need to cancel or reschedule your appointment, please contact [facility]  Dept: [phone]  and follow the prompts.  Office hours are 8:00 a.m. to 4:30 p.m. Monday - Friday. Please note that voicemails left after 4:00 p.m. may not be returned until the following business day.  We are closed weekends and major holidays. You have access to a nurse at all times for urgent questions. Please call the main number to the clinic Dept: [phone] and follow the prompts.   For any non-urgent questions, you may also contact your provider using MyChart. We now offer e-Visits for anyone 49 and older to request care online for non-urgent symptoms. For details visit [email or web address].   Also download the MyChart app! Go to the app store, search "MyChart", open the app, select [facility], and log in with your MyChart username and password.  Due to Covid, a mask is required upon entering the hospital/clinic. If you do not have a mask, one will be given to you upon arrival. For doctor visits, patients may have 1 support person aged 41 or older with them. For treatment visits, patients cannot have anyone with them due to current Covid guidelines and our immunocompromised population.

## 2021-12-13 ENCOUNTER — Other Ambulatory Visit: Payer: Commercial Managed Care - PPO

## 2021-12-20 ENCOUNTER — Other Ambulatory Visit: Payer: Commercial Managed Care - PPO

## 2021-12-22 ENCOUNTER — Ambulatory Visit (HOSPITAL_COMMUNITY)
Admission: RE | Admit: 2021-12-22 | Discharge: 2021-12-22 | Disposition: A | Discharge status: Home / Self Care | Payer: Commercial Managed Care - PPO | Source: Ambulatory Visit | Attending: Physician Assistant | Admitting: Physician Assistant

## 2021-12-22 ENCOUNTER — Encounter (HOSPITAL_COMMUNITY): Payer: Self-pay

## 2021-12-22 ENCOUNTER — Other Ambulatory Visit: Payer: Self-pay

## 2021-12-22 DIAGNOSIS — C3491 Malignant neoplasm of unspecified part of right bronchus or lung: Secondary | ICD-10-CM | POA: Diagnosis present

## 2021-12-22 MED ORDER — IOHEXOL 300 MG/ML  SOLN
100.0000 mL | Freq: Once | INTRAMUSCULAR | Status: AC | PRN
  Administered 2021-12-22: 100 mL via INTRAVENOUS

## 2021-12-22 MED ORDER — SODIUM CHLORIDE (PF) 0.9 % IJ SOLN
INTRAMUSCULAR | Status: AC
  Filled 2021-12-22: qty 50

## 2021-12-27 ENCOUNTER — Encounter: Payer: Self-pay | Admitting: Internal Medicine

## 2021-12-27 ENCOUNTER — Other Ambulatory Visit: Payer: Self-pay

## 2021-12-27 ENCOUNTER — Inpatient Hospital Stay: Payer: Commercial Managed Care - PPO | Admitting: Internal Medicine

## 2021-12-27 ENCOUNTER — Inpatient Hospital Stay: Payer: Commercial Managed Care - PPO | Attending: Hematology and Oncology

## 2021-12-27 ENCOUNTER — Inpatient Hospital Stay: Payer: Commercial Managed Care - PPO

## 2021-12-27 VITALS — BP 156/96 | HR 80

## 2021-12-27 VITALS — BP 156/99 | HR 88 | Temp 97.2°F | Resp 20 | Ht 61.0 in | Wt 116.6 lb

## 2021-12-27 DIAGNOSIS — Z5111 Encounter for antineoplastic chemotherapy: Secondary | ICD-10-CM | POA: Diagnosis present

## 2021-12-27 DIAGNOSIS — Z5112 Encounter for antineoplastic immunotherapy: Secondary | ICD-10-CM | POA: Insufficient documentation

## 2021-12-27 DIAGNOSIS — C3491 Malignant neoplasm of unspecified part of right bronchus or lung: Secondary | ICD-10-CM

## 2021-12-27 DIAGNOSIS — M329 Systemic lupus erythematosus, unspecified: Secondary | ICD-10-CM | POA: Diagnosis not present

## 2021-12-27 DIAGNOSIS — C3411 Malignant neoplasm of upper lobe, right bronchus or lung: Secondary | ICD-10-CM | POA: Diagnosis present

## 2021-12-27 LAB — CBC WITH DIFFERENTIAL (CANCER CENTER ONLY)
Abs Immature Granulocytes: 0.04 10*3/uL (ref 0.00–0.07)
Basophils Absolute: 0 10*3/uL (ref 0.0–0.1)
Basophils Relative: 0 %
Eosinophils Absolute: 0 10*3/uL (ref 0.0–0.5)
Eosinophils Relative: 0 %
HCT: 39.4 % (ref 36.0–46.0)
Hemoglobin: 12.9 g/dL (ref 12.0–15.0)
Immature Granulocytes: 0 %
Lymphocytes Relative: 12 %
Lymphs Abs: 1.4 10*3/uL (ref 0.7–4.0)
MCH: 32.6 pg (ref 26.0–34.0)
MCHC: 32.7 g/dL (ref 30.0–36.0)
MCV: 99.5 fL (ref 80.0–100.0)
Monocytes Absolute: 0.8 10*3/uL (ref 0.1–1.0)
Monocytes Relative: 7 %
Neutro Abs: 9.9 10*3/uL — ABNORMAL HIGH (ref 1.7–7.7)
Neutrophils Relative %: 81 %
Platelet Count: 289 10*3/uL (ref 150–400)
RBC: 3.96 MIL/uL (ref 3.87–5.11)
RDW: 14.7 % (ref 11.5–15.5)
WBC Count: 12.3 10*3/uL — ABNORMAL HIGH (ref 4.0–10.5)
nRBC: 0 % (ref 0.0–0.2)

## 2021-12-27 LAB — CMP (CANCER CENTER ONLY)
ALT: 94 U/L — ABNORMAL HIGH (ref 0–44)
AST: 40 U/L (ref 15–41)
Albumin: 4.2 g/dL (ref 3.5–5.0)
Alkaline Phosphatase: 131 U/L — ABNORMAL HIGH (ref 38–126)
Anion gap: 11 (ref 5–15)
BUN: 13 mg/dL (ref 6–20)
CO2: 23 mmol/L (ref 22–32)
Calcium: 9.8 mg/dL (ref 8.9–10.3)
Chloride: 103 mmol/L (ref 98–111)
Creatinine: 0.73 mg/dL (ref 0.44–1.00)
GFR, Estimated: >60 mL/min (ref >60)
Glucose, Bld: 154 mg/dL — ABNORMAL HIGH (ref 70–99)
Potassium: 3.7 mmol/L (ref 3.5–5.1)
Sodium: 137 mmol/L (ref 135–145)
Total Bilirubin: 0.3 mg/dL (ref 0.3–1.2)
Total Protein: 7.5 g/dL (ref 6.5–8.1)

## 2021-12-27 LAB — TOTAL PROTEIN, URINE DIPSTICK: Protein, ur: NEGATIVE mg/dL

## 2021-12-27 MED ORDER — SODIUM CHLORIDE 0.9 % IV SOLN: Freq: Once | INTRAVENOUS | Status: AC

## 2021-12-27 MED ORDER — SODIUM CHLORIDE 0.9 % IV SOLN
15.0000 mg/kg | Freq: Once | INTRAVENOUS | Status: AC
  Administered 2021-12-27: 800 mg via INTRAVENOUS
  Filled 2021-12-27: qty 32

## 2021-12-27 MED ORDER — SODIUM CHLORIDE 0.9 % IV SOLN
500.0000 mg/m2 | Freq: Once | INTRAVENOUS | Status: AC
  Administered 2021-12-27: 800 mg via INTRAVENOUS
  Filled 2021-12-27: qty 20

## 2021-12-27 MED ORDER — PROCHLORPERAZINE MALEATE 10 MG PO TABS
10.0000 mg | ORAL_TABLET | Freq: Once | ORAL | Status: AC
  Administered 2021-12-27: 10 mg via ORAL
  Filled 2021-12-27: qty 1

## 2021-12-27 NOTE — Progress Notes (Signed)
Per Dr. Julien Nordmann - okay to treat with elevated BP and ALT.

## 2021-12-27 NOTE — Patient Instructions (Signed)
[facility] ONCOLOG[facility]   Discharge Instructions: Thank you for choosing [facility] to provide your oncology and hematology care.   If you have a lab appointment with the [facility], please go directly to the [facility] and check in at the registration area.   Wear comfortable clothing and clothing appropriate for easy access to any Portacath or PICC line.   We strive to give you quality time with your provider. You may need to reschedule your appointment if you arrive late (15 or more minutes).  Arriving late affects you and other patients whose appointments are after yours.  Also, if you miss three or more appointments without notifying the office, you may be dismissed from the clinic at the providers discretion.      For prescription refill requests, have your pharmacy contact our office and allow 72 hours for refills to be completed.    Today you received the following chemotherapy and/or immunotherapy agents: Bevacizumab (Avastin) and Pemetrexed (Alimta)      To help prevent nausea and vomiting after your treatment, we encourage you to take your nausea medication as directed.  BELOW ARE SYMPTOMS THAT SHOULD BE REPORTED IMMEDIATELY: *FEVER GREATER THAN 100.4 F (38 C) OR HIGHER *CHILLS OR SWEATING *NAUSEA AND VOMITING THAT IS NOT CONTROLLED WITH YOUR NAUSEA MEDICATION *UNUSUAL SHORTNESS OF BREATH *UNUSUAL BRUISING OR BLEEDING *URINARY PROBLEMS (pain or burning when urinating, or frequent urination) *BOWEL PROBLEMS (unusual diarrhea, constipation, pain near the anus) TENDERNESS IN MOUTH AND THROAT WITH OR WITHOUT PRESENCE OF ULCERS (sore throat, sores in mouth, or a toothache) UNUSUAL RASH, SWELLING OR PAIN  UNUSUAL VAGINAL DISCHARGE OR ITCHING   Items with * indicate a potential emergency and should be followed up as soon as possible or go to the Emergency Department if any problems should occur.  Please show the CHEMOTHERAPY ALERT CARD or  IMMUNOTHERAPY ALERT CARD at check-in to the Emergency Department and triage nurse.  Should you have questions after your visit or need to cancel or reschedule your appointment, please contact [facility]  Dept: [phone]  and follow the prompts.  Office hours are 8:00 a.m. to 4:30 p.m. Monday - Friday. Please note that voicemails left after 4:00 p.m. may not be returned until the following business day.  We are closed weekends and major holidays. You have access to a nurse at all times for urgent questions. Please call the main number to the clinic Dept: [phone] and follow the prompts.   For any non-urgent questions, you may also contact your provider using MyChart. We now offer e-Visits for anyone 80 and older to request care online for non-urgent symptoms. For details visit [email or web address].   Also download the MyChart app! Go to the app store, search "MyChart", open the app, select [facility], and log in with your MyChart username and password.  Due to Covid, a mask is required upon entering the hospital/clinic. If you do not have a mask, one will be given to you upon arrival. For doctor visits, patients may have 1 support person aged 78 or older with them. For treatment visits, patients cannot have anyone with them due to current Covid guidelines and our immunocompromised population.

## 2021-12-27 NOTE — Progress Notes (Signed)
Kirsten Williams [phone]   [phone]  OFFICE PROGRESS NOTE  Kirsten Bender, PA-C [place] [address]  DIAGNOSIS:  Stage IV (T4, N2, M1 a) non-small cell lung cancer, adenocarcinoma presented with multifocal disease involving the right upper lobe, right lower lobe as well as suspicious lower paratracheal lymphadenopathy and groundglass nodules in the left lung diagnosed in May 2022. The patient had molecular studies performed by [facility] and that showed no detectable mutation but this is likely secondary to low-level circulating free tumor DNA.  PRIOR THERAPY: None.  CURRENT THERAPY: palliative systemic chemotherapy with carboplatin for AUC of 5, Alimta 500 Mg/M2 and Avastin 15 Mg/KG every 3 weeks.  The patient is not a great candidate for immunotherapy because of her history of active systemic lupus erythematosus.  She is status post 9 cycles.  INTERVAL HISTORY: Kirsten Williams 51 y.o. female returns to the clinic today for follow-up visit.  The patient is feeling fine today with no concerning complaints except some aching in the arms and shoulder area after her chemotherapy treatment.  She denied having any current chest pain, shortness of breath, cough or hemoptysis.  She denied having any fever or chills.  She has no nausea, vomiting, diarrhea or constipation.  She has no headache or visual changes.  She denied having any significant weight loss or night sweats.  She had repeat CT scan of the chest, abdomen pelvis performed recently and she is here for evaluation and discussion of her scan results.  MEDICAL HISTORY: Past Medical History:  Diagnosis Date   Adenocarcinoma, lung, right ([place]) 05/03/2021   Kirsten Williams is a 51 y.o. female with a history of lung nodules dating back to 2019 who is referred in consultation with Kirsten Bender, PA-C for assessment and management. She is a former smoker having quit in 2016. To date,  nodules have been stable as well as likely adrenal adenomas. She missed her screening CT last year and imaging was ordered last month. CT chest from 02-16-2021 reveals pro   Anxiety    GERD (gastroesophageal reflux disease)    SLE (systemic lupus erythematosus) ([place]) 05/03/2021   SLE (systemic lupus erythematosus) ([place]) 05/03/2021    ALLERGIES:  is allergic to carboplatin.  MEDICATIONS:  Current Outpatient Medications  Medication Sig Dispense Refill   Calcium Carbonate-Vit D-Min (CALCIUM 1200 PO) Take 1 capsule by mouth daily.     dexamethasone (DECADRON) 4 MG tablet 4 mg p.o. twice daily the day before, day of and day after the chemotherapy every 3 weeks. 40 tablet 1   folic acid (FOLVITE) 1 MG tablet TAKE 1 TABLET(1 MG) BY MOUTH DAILY 30 tablet 4   hydroxychloroquine (PLAQUENIL) 200 MG tablet Take 200 mg by mouth 2 (two) times daily.     Multiple Vitamins-Minerals (MULTI ADULT GUMMIES) CHEW Chew 2 tablets by mouth daily.     oxyCODONE-acetaminophen (PERCOCET/ROXICET) 5-325 MG tablet Take 1 tablet by mouth every 8 (eight) hours as needed for severe pain. 30 tablet 0   prochlorperazine (COMPAZINE) 10 MG tablet Take 1 tablet (10 mg total) by mouth every 6 (six) hours as needed. 30 tablet 2   rizatriptan (MAXALT-MLT) 10 MG disintegrating tablet Take 10 mg by mouth 2 (two) times daily as needed.     triamcinolone ointment (KENALOG) 0.5 % Apply topically 3 (three) times daily.     acetaminophen (TYLENOL) 325 MG tablet Take 975 mg by mouth every 6 (six) hours  as needed for moderate pain or headache. (Patient not taking: Reported on 12/27/2021)     cyclobenzaprine (FLEXERIL) 10 MG tablet Take 10 mg by mouth at bedtime as needed for muscle spasms. (Patient not taking: Reported on 11/15/2021)     dicyclomine (BENTYL) 10 MG capsule Take 10 mg by mouth every 6 (six) hours as needed. (Patient not taking: Reported on 11/15/2021)     diphenhydrAMINE (BENADRYL) 25 MG tablet Take 25 mg by mouth daily as needed  for allergies. (Patient not taking: Reported on 12/27/2021)     hydrocortisone 1 % lotion Apply 1 application topically 2 (two) times daily. (Patient not taking: Reported on 11/15/2021) 118 mL 0   omeprazole (PRILOSEC) 40 MG capsule Take 40 mg by mouth daily as needed (acid reflux). (Patient not taking: Reported on 11/15/2021)     ondansetron (ZOFRAN) 4 MG tablet Take 8 mg by mouth 2 (two) times daily as needed for vomiting or nausea. (Patient not taking: Reported on 11/15/2021)     No current facility-administered medications for this visit.    SURGICAL HISTORY:  Past Surgical History:  Procedure Laterality Date   ABDOMINAL HYSTERECTOMY     BRONCHIAL BIOPSY  04/24/2021   Procedure: BRONCHIAL BIOPSIES;  Surgeon: Collene Gobble, MD;  Location: [facility] ENDOSCOPY;  Service: Pulmonary;;   BRONCHIAL BRUSHINGS  04/24/2021   Procedure: BRONCHIAL BRUSHINGS;  Surgeon: Collene Gobble, MD;  Location: [facility] ENDOSCOPY;  Service: Pulmonary;;   BRONCHIAL NEEDLE ASPIRATION BIOPSY  04/24/2021   Procedure: BRONCHIAL NEEDLE ASPIRATION BIOPSIES;  Surgeon: Collene Gobble, MD;  Location: [facility];  Service: Pulmonary;;   BRONCHIAL WASHINGS  04/24/2021   Procedure: BRONCHIAL WASHINGS;  Surgeon: Collene Gobble, MD;  Location: [facility];  Service: Pulmonary;;   CESAREAN SECTION  11/09/1990   DILATION AND CURETTAGE OF UTERUS Bilateral 09/09/1996   FIDUCIAL MARKER PLACEMENT  04/24/2021   Procedure: FIDUCIAL MARKER PLACEMENT;  Surgeon: Collene Gobble, MD;  Location: [facility] ENDOSCOPY;  Service: Pulmonary;;   TONSILLECTOMY  12/10/1977   VIDEO BRONCHOSCOPY WITH ENDOBRONCHIAL NAVIGATION Bilateral 04/24/2021   Procedure: VIDEO BRONCHOSCOPY WITH ENDOBRONCHIAL NAVIGATION;  Surgeon: Collene Gobble, MD;  Location: [facility] ENDOSCOPY;  Service: Pulmonary;  Laterality: Bilateral;    REVIEW OF SYSTEMS:  Constitutional: positive for fatigue Eyes: negative Ears, nose, mouth, throat, and face: negative Respiratory: negative Cardiovascular:  negative Gastrointestinal: negative Genitourinary:negative Integument/breast: negative Hematologic/lymphatic: negative Musculoskeletal:negative Neurological: negative Behavioral/Psych: negative Endocrine: negative Allergic/Immunologic: negative   PHYSICAL EXAMINATION: General appearance: alert, cooperative, fatigued, and no distress Head: Normocephalic, without obvious abnormality, atraumatic Neck: no adenopathy, no JVD, supple, symmetrical, trachea midline, and thyroid not enlarged, symmetric, no tenderness/mass/nodules Lymph nodes: Cervical, supraclavicular, and axillary nodes normal. Resp: clear to auscultation bilaterally Back: symmetric, no curvature. ROM normal. No CVA tenderness. Cardio: regular rate and rhythm, S1, S2 normal, no murmur, click, rub or gallop GI: soft, non-tender; bowel sounds normal; no masses,  no organomegaly Extremities: extremities normal, atraumatic, no cyanosis or edema Neurologic: Alert and oriented X 3, normal strength and tone. Normal symmetric reflexes. Normal coordination and gait  ECOG PERFORMANCE STATUS: 1 - Symptomatic but completely ambulatory  Blood pressure (!) 156/99, pulse 88, temperature (!) 97.2 F (36.2 C), temperature source Tympanic, resp. rate 20, height 5\' 1"  (1.549 m), weight 116 lb 9.6 oz (52.9 kg), SpO2 100 %.  LABORATORY DATA: Lab Results  Component Value Date   WBC 12.3 (H) 12/27/2021   HGB 12.9 12/27/2021   HCT 39.4 12/27/2021   MCV 99.5 12/27/2021  PLT 289 12/27/2021      Chemistry      Component Value Date/Time   NA 137 12/27/2021 0929   NA 140 03/13/2021 0000   K 3.7 12/27/2021 0929   CL 103 12/27/2021 0929   CO2 23 12/27/2021 0929   BUN 13 12/27/2021 0929   BUN 10 03/13/2021 0000   CREATININE 0.73 12/27/2021 0929   GLU 107 03/13/2021 0000      Component Value Date/Time   CALCIUM 9.8 12/27/2021 0929   ALKPHOS 131 (H) 12/27/2021 0929   AST 40 12/27/2021 0929   ALT 94 (H) 12/27/2021 0929   BILITOT 0.3  12/27/2021 0929       RADIOGRAPHIC STUDIES: CT Chest W Contrast  Result Date: 12/22/2021 CLINICAL DATA:  Primary Cancer Type: Lung Imaging Indication: Assess response to therapy Interval therapy since last imaging? Yes Initial Cancer Diagnosis Date: 04/24/2021; Established by: Biopsy-proven Detailed Pathology: Stage IV non-small cell lung cancer, adenocarcinoma. Primary Tumor location:  Right upper and right lower lobe. Surgeries: Hysterectomy. Chemotherapy: Yes; Ongoing? Yes; Most recent administration: 12/06/2021 Immunotherapy? No Radiation therapy? No EXAM: CT CHEST, ABDOMEN, AND PELVIS WITH CONTRAST TECHNIQUE: Multidetector CT imaging of the chest, abdomen and pelvis was performed following the standard protocol during bolus administration of intravenous contrast. RADIATION DOSE REDUCTION: This exam was performed according to the departmental dose-optimization program which includes automated exposure control, adjustment of the mA and/or kV according to patient size and/or use of iterative reconstruction technique. CONTRAST:  137mL OMNIPAQUE IOHEXOL 300 MG/ML  SOLN COMPARISON:  Most recent CT chest, abdomen and pelvis 10/20/2021. 03/06/2021 PET-CT. FINDINGS: CT CHEST FINDINGS Cardiovascular: Heart size is normal. There is no significant pericardial fluid, thickening or pericardial calcification. Atherosclerotic calcifications in the thoracic aorta. No definite coronary artery calcifications. Mediastinum/Nodes: No pathologically enlarged mediastinal or hilar lymph nodes. Esophagus is unremarkable in appearance. No axillary lymphadenopathy. Lungs/Pleura: Again noted are multiple tiny 2-5 mm solid-appearing pulmonary nodules scattered throughout the lungs bilaterally, which are stable in size and number. In addition, there are multiple sub solid lesions in the lungs bilaterally which are grossly similar compared to the recent prior study. Specific examples include a sub solid right lower lobe lesion (axial  image 61 of series 4) which is predominantly cystic in appearance and measures 1.5 x 1.1 cm (unchanged), a 2.1 x 1.3 cm lesion (axial image 40 of series 4) in the periphery of the left upper lobe (unchanged), and several other smaller lesions which are also grossly unchanged. No new suspicious appearing pulmonary nodules or masses are otherwise noted. No acute consolidative airspace disease. No pleural effusions. Mild diffuse bronchial wall thickening with very mild centrilobular and paraseptal emphysema. Musculoskeletal: There are no aggressive appearing lytic or blastic lesions noted in the visualized portions of the skeleton. CT ABDOMEN PELVIS FINDINGS Hepatobiliary: No suspicious cystic or solid hepatic lesions. No intra or extrahepatic biliary ductal dilatation. Gallbladder is normal in appearance. Pancreas: No pancreatic mass. No pancreatic ductal dilatation. No pancreatic or peripancreatic fluid collections or inflammatory changes. Spleen: Unremarkable. Adrenals/Urinary Tract: Low-attenuation adrenal nodules bilaterally, similar to prior examinations, previously not hypermetabolic on prior PET-CT and characterized as benign adenomas, measuring 1.6 x 2.0 cm on the right and 2.8 x 1.4 cm on the left. Bilateral kidneys are normal in appearance. No hydroureteronephrosis. Urinary bladder is normal in appearance. Stomach/Bowel: The appearance of the stomach is normal. No pathologic dilatation of small bowel or colon. Normal appendix. Vascular/Lymphatic: Aortic atherosclerosis, without evidence of aneurysm or dissection in the abdominal  or pelvic vasculature. No lymphadenopathy noted in the abdomen or pelvis. Reproductive: Status post hysterectomy. Ovaries are not confidently identified may be surgically absent or atrophic. Other: No significant volume of ascites.  No pneumoperitoneum. Musculoskeletal: There are no aggressive appearing lytic or blastic lesions noted in the visualized portions of the skeleton.  IMPRESSION: 1. Stable number and size of pulmonary nodules, as above, which remain highly concerning for multifocal primary bronchogenic adenocarcinoma. 2. No significant lymphadenopathy or definite signs of metastatic disease identified in the abdomen or pelvis. 3. Mild diffuse bronchial wall thickening with very mild centrilobular and paraseptal emphysema; imaging findings suggestive of underlying COPD. 4. Aortic atherosclerosis. 5. Bilateral adrenal adenomas again noted. 6. Additional incidental findings, similar to prior studies, as above. Electronically Signed   By: Vinnie Langton M.D.   On: 12/22/2021 10:52   CT Abdomen Pelvis W Contrast  Result Date: 12/22/2021 CLINICAL DATA:  Primary Cancer Type: Lung Imaging Indication: Assess response to therapy Interval therapy since last imaging? Yes Initial Cancer Diagnosis Date: 04/24/2021; Established by: Biopsy-proven Detailed Pathology: Stage IV non-small cell lung cancer, adenocarcinoma. Primary Tumor location:  Right upper and right lower lobe. Surgeries: Hysterectomy. Chemotherapy: Yes; Ongoing? Yes; Most recent administration: 12/06/2021 Immunotherapy? No Radiation therapy? No EXAM: CT CHEST, ABDOMEN, AND PELVIS WITH CONTRAST TECHNIQUE: Multidetector CT imaging of the chest, abdomen and pelvis was performed following the standard protocol during bolus administration of intravenous contrast. RADIATION DOSE REDUCTION: This exam was performed according to the departmental dose-optimization program which includes automated exposure control, adjustment of the mA and/or kV according to patient size and/or use of iterative reconstruction technique. CONTRAST:  192mL OMNIPAQUE IOHEXOL 300 MG/ML  SOLN COMPARISON:  Most recent CT chest, abdomen and pelvis 10/20/2021. 03/06/2021 PET-CT. FINDINGS: CT CHEST FINDINGS Cardiovascular: Heart size is normal. There is no significant pericardial fluid, thickening or pericardial calcification. Atherosclerotic calcifications in  the thoracic aorta. No definite coronary artery calcifications. Mediastinum/Nodes: No pathologically enlarged mediastinal or hilar lymph nodes. Esophagus is unremarkable in appearance. No axillary lymphadenopathy. Lungs/Pleura: Again noted are multiple tiny 2-5 mm solid-appearing pulmonary nodules scattered throughout the lungs bilaterally, which are stable in size and number. In addition, there are multiple sub solid lesions in the lungs bilaterally which are grossly similar compared to the recent prior study. Specific examples include a sub solid right lower lobe lesion (axial image 61 of series 4) which is predominantly cystic in appearance and measures 1.5 x 1.1 cm (unchanged), a 2.1 x 1.3 cm lesion (axial image 40 of series 4) in the periphery of the left upper lobe (unchanged), and several other smaller lesions which are also grossly unchanged. No new suspicious appearing pulmonary nodules or masses are otherwise noted. No acute consolidative airspace disease. No pleural effusions. Mild diffuse bronchial wall thickening with very mild centrilobular and paraseptal emphysema. Musculoskeletal: There are no aggressive appearing lytic or blastic lesions noted in the visualized portions of the skeleton. CT ABDOMEN PELVIS FINDINGS Hepatobiliary: No suspicious cystic or solid hepatic lesions. No intra or extrahepatic biliary ductal dilatation. Gallbladder is normal in appearance. Pancreas: No pancreatic mass. No pancreatic ductal dilatation. No pancreatic or peripancreatic fluid collections or inflammatory changes. Spleen: Unremarkable. Adrenals/Urinary Tract: Low-attenuation adrenal nodules bilaterally, similar to prior examinations, previously not hypermetabolic on prior PET-CT and characterized as benign adenomas, measuring 1.6 x 2.0 cm on the right and 2.8 x 1.4 cm on the left. Bilateral kidneys are normal in appearance. No hydroureteronephrosis. Urinary bladder is normal in appearance. Stomach/Bowel: The  appearance of  the stomach is normal. No pathologic dilatation of small bowel or colon. Normal appendix. Vascular/Lymphatic: Aortic atherosclerosis, without evidence of aneurysm or dissection in the abdominal or pelvic vasculature. No lymphadenopathy noted in the abdomen or pelvis. Reproductive: Status post hysterectomy. Ovaries are not confidently identified may be surgically absent or atrophic. Other: No significant volume of ascites.  No pneumoperitoneum. Musculoskeletal: There are no aggressive appearing lytic or blastic lesions noted in the visualized portions of the skeleton. IMPRESSION: 1. Stable number and size of pulmonary nodules, as above, which remain highly concerning for multifocal primary bronchogenic adenocarcinoma. 2. No significant lymphadenopathy or definite signs of metastatic disease identified in the abdomen or pelvis. 3. Mild diffuse bronchial wall thickening with very mild centrilobular and paraseptal emphysema; imaging findings suggestive of underlying COPD. 4. Aortic atherosclerosis. 5. Bilateral adrenal adenomas again noted. 6. Additional incidental findings, similar to prior studies, as above. Electronically Signed   By: Vinnie Langton M.D.   On: 12/22/2021 10:52    ASSESSMENT AND PLAN:  This is a very pleasant 51 years old white female recently diagnosed with a stage IV non-small cell lung cancer, adenocarcinoma with no actionable mutation diagnosed in May 2022.  The patient has also history of systemic lupus erythematosus and she is not a candidate for immunotherapy. She is currently undergoing systemic chemotherapy with carboplatin for AUC of 5, Alimta 500 Mg/M2 and Avastin 15 Mg/KG every 3 weeks status post 9 cycles.  Starting from cycle #7 the patient is on maintenance treatment with Alimta and Avastin every 3 weeks. The patient continues to tolerate her treatment with Alimta and Avastin fairly well. She had repeat CT scan of the chest, abdomen pelvis performed recently.  I  personally and independently reviewed the scans and discussed the results with the patient today. Her scan showed no concerning findings for disease progression and in general she has a stable disease. I recommended for the patient to continue her current treatment with maintenance Alimta and Avastin and she will proceed with cycle #10 today. For the dryness of the eye, she was giving eyedrops by her ophthalmologist and she is feeling better. For the skin rash she will continue to apply hydrocortisone cream on the affected area. She will come back for follow-up visit in 3 weeks for evaluation before the next cycle of her treatment. The patient was advised to call immediately if she has any other concerning symptoms in the interval.  The patient voices understanding of current disease status and treatment options and is in agreement with the current care plan.  All questions were answered. The patient knows to call the clinic with any problems, questions or concerns. We can certainly see the patient much sooner if necessary.  Disclaimer: This note was dictated with voice recognition software. Similar sounding words can inadvertently be transcribed and may not be corrected upon review.

## 2022-01-03 ENCOUNTER — Other Ambulatory Visit: Payer: Commercial Managed Care - PPO

## 2022-01-15 NOTE — Progress Notes (Signed)
[facility] OFFICE PROGRESS NOTE  Cyndi Bender, PA-C [place] [address]  DIAGNOSIS: Stage IV (T4, N2, M1 a) non-small cell lung cancer, adenocarcinoma presented with multifocal disease involving the right upper lobe, right lower lobe as well as suspicious lower paratracheal lymphadenopathy and groundglass nodules in the left lung diagnosed in May 2022. The patient had molecular studies performed by [facility] and that showed no detectable mutation but this is likely secondary to low-level circulating free tumor DNA.  PRIOR THERAPY: None  CURRENT THERAPY: Palliative systemic chemotherapy with carboplatin for AUC of 5, Alimta 500 Mg/M2 and Avastin 15 Mg/KG every 3 weeks.  The patient is not a great candidate for immunotherapy because of her history of active systemic lupus erythematosus.  She is status post 10 cycles. Starting from cycle #6, the patient will start maintenance Alimta and Avastin.  INTERVAL HISTORY: Kirsten Williams 51 y.o. female returns to the clinic today for a follow-up visit. The patient is feeling fairly well today without any concerning complaints. She is tolerating her treatment well without any concerning adverse side effects except she has some fatigue though week following treatment.  She denies any fever or chills.  She has occasional night sweats which is not new for her about 2-3 days after her treatment. Her appetite has improved but she was initially followed by member the nutritionist team while she was first diagnosed.  She states she [place] well now. She denies any significant shortness of breath or hemoptysis.  She denies any chest pain. She has an occasional mild cough which produces clear sputum.  She sometimes has a sensation of tightness around her diaphragm the first week following treatment.  She has mild nausea which is controlled with her antiemetic. She denies vomiting. She generally has chronic diarrhea which is normal for  her. She had a little bit of abdominal cramping yesterday so she took 3 tylenol. She denies any abnormal bleeding or bruising except for some gingival bleeding if bruising her tooth.  She is prone to headaches and has a history of migraines. She states she sometimes has a headache but attributes that to a change in the weather recently. She recently had a follow up with her eye doctor who recommended eyedrops with systane.  She is here today for evaluation before starting cycle #11.  MEDICAL HISTORY: Past Medical History:  Diagnosis Date   Adenocarcinoma, lung, right ([place]) 05/03/2021   LUCA BURSTON is a 51 y.o. female with a history of lung nodules dating back to 2019 who is referred in consultation with Cyndi Bender, PA-C for assessment and management. She is a former smoker having quit in 2016. 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 She missed her screening CT last year and imaging was ordered last month. CT chest from 02-16-2021 reveals pro   Anxiety    GERD (gastroesophageal reflux disease)    SLE (systemic lupus erythematosus) ([place]) 05/03/2021   SLE (systemic lupus erythematosus) ([place]) 05/03/2021    ALLERGIES:  is allergic to carboplatin.  MEDICATIONS:  Current Outpatient Medications  Medication Sig Dispense Refill   acetaminophen (TYLENOL) 325 MG tablet Take 975 mg by mouth every 6 (six) hours as needed for moderate pain or headache.     Calcium Carbonate-Vit D-Min (CALCIUM 1200 PO) Take 1 capsule by mouth daily.     cholecalciferol (VITAMIN D3) 25 MCG (1000 UNIT) tablet Take 2,000 Units by mouth daily.  cyclobenzaprine (FLEXERIL) 10 MG tablet Take 10 mg by mouth at bedtime as needed for muscle spasms.     dexamethasone (DECADRON) 4 MG tablet 4 mg p.o. twice daily the day before, day of and day after the chemotherapy every 3 weeks. 40 tablet 1   dicyclomine (BENTYL) 10 MG capsule Take 10 mg by mouth every 6 (six) hours as needed.     diphenhydrAMINE  (BENADRYL) 25 MG tablet Take 25 mg by mouth daily as needed for allergies.     folic acid (FOLVITE) 1 MG tablet TAKE 1 TABLET(1 MG) BY MOUTH DAILY 30 tablet 4   hydrocortisone 1 % lotion Apply 1 application topically 2 (two) times daily. 118 mL 0   hydroxychloroquine (PLAQUENIL) 200 MG tablet Take 200 mg by mouth 2 (two) times daily.     Multiple Vitamins-Minerals (MULTI ADULT GUMMIES) CHEW Chew 2 tablets by mouth daily.     omeprazole (PRILOSEC) 40 MG capsule Take 40 mg by mouth daily as needed (acid reflux).     ondansetron (ZOFRAN) 4 MG tablet Take 8 mg by mouth 2 (two) times daily as needed for vomiting or nausea.     oxyCODONE-acetaminophen (PERCOCET/ROXICET) 5-325 MG tablet Take 1 tablet by mouth every 8 (eight) hours as needed for severe pain. 30 tablet 0   prochlorperazine (COMPAZINE) 10 MG tablet Take 1 tablet (10 mg total) by mouth every 6 (six) hours as needed. 30 tablet 2   rizatriptan (MAXALT-MLT) 10 MG disintegrating tablet Take 10 mg by mouth 2 (two) times daily as needed.     tetrahydrozoline 0.05 % ophthalmic solution Place 1 drop into both eyes once. Systane Eye Drops once daily both eyes     triamcinolone ointment (KENALOG) 0.5 % Apply topically 3 (three) times daily.     No current facility-administered medications for this visit.    SURGICAL HISTORY:  Past Surgical History:  Procedure Laterality Date   ABDOMINAL HYSTERECTOMY     BRONCHIAL BIOPSY  04/24/2021   Procedure: BRONCHIAL BIOPSIES;  Surgeon: Collene Gobble, MD;  Location: [facility] ENDOSCOPY;  Service: Pulmonary;;   BRONCHIAL BRUSHINGS  04/24/2021   Procedure: BRONCHIAL BRUSHINGS;  Surgeon: Collene Gobble, MD;  Location: [facility] ENDOSCOPY;  Service: Pulmonary;;   BRONCHIAL NEEDLE ASPIRATION BIOPSY  04/24/2021   Procedure: BRONCHIAL NEEDLE ASPIRATION BIOPSIES;  Surgeon: Collene Gobble, MD;  Location: [facility];  Service: Pulmonary;;   BRONCHIAL WASHINGS  04/24/2021   Procedure: BRONCHIAL WASHINGS;  Surgeon: Collene Gobble, MD;  Location: [facility];  Service: Pulmonary;;   CESAREAN SECTION  11/09/1990   DILATION AND CURETTAGE OF UTERUS Bilateral 09/09/1996   FIDUCIAL MARKER PLACEMENT  04/24/2021   Procedure: FIDUCIAL MARKER PLACEMENT;  Surgeon: Collene Gobble, MD;  Location: [facility] ENDOSCOPY;  Service: Pulmonary;;   TONSILLECTOMY  12/10/1977   VIDEO BRONCHOSCOPY WITH ENDOBRONCHIAL NAVIGATION Bilateral 04/24/2021   Procedure: VIDEO BRONCHOSCOPY WITH ENDOBRONCHIAL NAVIGATION;  Surgeon: Collene Gobble, MD;  Location: [facility] ENDOSCOPY;  Service: Pulmonary;  Laterality: Bilateral;    REVIEW OF SYSTEMS:   Constitutional: Positive for fatigue 1 week following treatment. Negative for appetite change, chills, fever and unexpected weight change.  HENT: Negative for mouth sores, nosebleeds, sore throat and trouble swallowing.   Eyes: Negative for eye problems and icterus.  Respiratory: Positive for mild cough. Negative for hemoptysis, shortness of breath and wheezing.   Cardiovascular: Negative for chest pain and leg swelling.  Gastrointestinal: Positive for occasional nausea. Positive for chronic diarrhea. Positive for mild abdominal cramping  yesterday, none today.  Genitourinary: Negative for bladder incontinence, difficulty urinating, dysuria, frequency and hematuria.   Musculoskeletal:  Negative for back pain, gait problem, neck pain and neck stiffness.  Skin: Negative for rash or itching.  Neurological: Negative for dizziness, extremity weakness, gait problem, headaches, light-headedness and seizures.  Hematological: Negative for adenopathy. Does not bruise/bleed easily.  Psychiatric/Behavioral: Negative for confusion, depression and sleep disturbance. The patient is not nervous/anxious.    PHYSICAL EXAMINATION:  Blood pressure 136/78, pulse 77, temperature (!) 97 F (36.1 C), temperature source Tympanic, resp. rate 17, weight 117 lb (53.1 kg), SpO2 99 %.  ECOG PERFORMANCE STATUS: 1  Physical Exam   Constitutional: Oriented to person, place, and time and well-developed, well-nourished, and in no distress.  HENT:  Head: Normocephalic and atraumatic.  Mouth/Throat: Oropharynx is clear and moist. No oropharyngeal exudate.  Eyes: Conjunctivae are normal. Right eye exhibits no discharge. Left eye exhibits no discharge. No scleral icterus.  Neck: Normal range of motion. Neck supple.  Cardiovascular: Normal rate, regular rhythm, normal heart sounds and intact distal pulses.   Pulmonary/Chest: Effort normal and breath sounds normal. No respiratory distress. No wheezes. No rales.  Abdominal: Soft. Bowel sounds are normal. Exhibits no distension and no mass. There is no tenderness.  Musculoskeletal: Normal range of motion. Exhibits no edema.  Lymphadenopathy:    No cervical adenopathy.  Neurological: Alert and oriented to person, place, and time. Exhibits normal muscle tone. Gait normal. Coordination normal.  Skin: Skin is warm and dry. No rash noted. Not diaphoretic. No erythema. No pallor.  Psychiatric: Mood, memory and judgment normal.  Vitals reviewed.  LABORATORY DATA: Lab Results  Component Value Date   WBC 14.4 (H) 01/17/2022   HGB 13.7 01/17/2022   HCT 40.9 01/17/2022   MCV 96.9 01/17/2022   PLT 244 01/17/2022      Chemistry      Component Value Date/Time   NA 137 01/17/2022 1017   NA 140 03/13/2021 0000   K 4.2 01/17/2022 1017   CL 107 01/17/2022 1017   CO2 22 01/17/2022 1017   BUN 16 01/17/2022 1017   BUN 10 03/13/2021 0000   CREATININE 0.72 01/17/2022 1017   GLU 107 03/13/2021 0000      Component Value Date/Time   CALCIUM 10.4 (H) 01/17/2022 1017   ALKPHOS 117 01/17/2022 1017   AST 68 (H) 01/17/2022 1017   ALT 111 (H) 01/17/2022 1017   BILITOT 0.3 01/17/2022 1017       RADIOGRAPHIC STUDIES:  CT Chest W Contrast  Result Date: 12/22/2021 CLINICAL DATA:  Primary Cancer Type: Lung Imaging Indication: Assess response to therapy Interval therapy since last  imaging? Yes Initial Cancer Diagnosis Date: 04/24/2021; Established by: Biopsy-proven Detailed Pathology: Stage IV non-small cell lung cancer, adenocarcinoma. Primary Tumor location:  Right upper and right lower lobe. Surgeries: Hysterectomy. Chemotherapy: Yes; Ongoing? Yes; Most recent administration: 12/06/2021 Immunotherapy? No Radiation therapy? No EXAM: CT CHEST, ABDOMEN, AND PELVIS WITH CONTRAST TECHNIQUE: Multidetector CT imaging of the chest, abdomen and pelvis was performed following the standard protocol during bolus administration of intravenous contrast. RADIATION DOSE REDUCTION: This exam was performed according to the departmental dose-optimization program which includes automated exposure control, adjustment of the mA and/or kV according to patient size and/or use of iterative reconstruction technique. CONTRAST:  169mL OMNIPAQUE IOHEXOL 300 MG/ML  SOLN COMPARISON:  Most recent CT chest, abdomen and pelvis 10/20/2021. 03/06/2021 PET-CT. FINDINGS: CT CHEST FINDINGS Cardiovascular: Heart size is normal. There is no significant pericardial  fluid, thickening or pericardial calcification. Atherosclerotic calcifications in the thoracic aorta. No definite coronary artery calcifications. Mediastinum/Nodes: No pathologically enlarged mediastinal or hilar lymph nodes. Esophagus is unremarkable in appearance. No axillary lymphadenopathy. Lungs/Pleura: Again noted are multiple tiny 2-5 mm solid-appearing pulmonary nodules scattered throughout the lungs bilaterally, which are stable in size and number. In addition, there are multiple sub solid lesions in the lungs bilaterally which are grossly similar compared to the recent prior study. Specific examples include a sub solid right lower lobe lesion (axial image 61 of series 4) which is predominantly cystic in appearance and measures 1.5 x 1.1 cm (unchanged), a 2.1 x 1.3 cm lesion (axial image 40 of series 4) in the periphery of the left upper lobe (unchanged), and  several other smaller lesions which are also grossly unchanged. No new suspicious appearing pulmonary nodules or masses are otherwise noted. No acute consolidative airspace disease. No pleural effusions. Mild diffuse bronchial wall thickening with very mild centrilobular and paraseptal emphysema. Musculoskeletal: There are no aggressive appearing lytic or blastic lesions noted in the visualized portions of the skeleton. CT ABDOMEN PELVIS FINDINGS Hepatobiliary: No suspicious cystic or solid hepatic lesions. No intra or extrahepatic biliary ductal dilatation. Gallbladder is normal in appearance. Pancreas: No pancreatic mass. No pancreatic ductal dilatation. No pancreatic or peripancreatic fluid collections or inflammatory changes. Spleen: Unremarkable. Adrenals/Urinary Tract: Low-attenuation adrenal nodules bilaterally, similar to prior examinations, previously not hypermetabolic on prior PET-CT and characterized as benign adenomas, measuring 1.6 x 2.0 cm on the right and 2.8 x 1.4 cm on the left. Bilateral kidneys are normal in appearance. No hydroureteronephrosis. Urinary bladder is normal in appearance. Stomach/Bowel: The appearance of the stomach is normal. No pathologic dilatation of small bowel or colon. Normal appendix. Vascular/Lymphatic: Aortic atherosclerosis, without evidence of aneurysm or dissection in the abdominal or pelvic vasculature. No lymphadenopathy noted in the abdomen or pelvis. Reproductive: Status post hysterectomy. Ovaries are not confidently identified may be surgically absent or atrophic. Other: No significant volume of ascites.  No pneumoperitoneum. Musculoskeletal: There are no aggressive appearing lytic or blastic lesions noted in the visualized portions of the skeleton. IMPRESSION: 1. Stable number and size of pulmonary nodules, as above, which remain highly concerning for multifocal primary bronchogenic adenocarcinoma. 2. No significant lymphadenopathy or definite signs of metastatic  disease identified in the abdomen or pelvis. 3. Mild diffuse bronchial wall thickening with very mild centrilobular and paraseptal emphysema; imaging findings suggestive of underlying COPD. 4. Aortic atherosclerosis. 5. Bilateral adrenal adenomas again noted. 6. Additional incidental findings, similar to prior studies, as above. Electronically Signed   By: Vinnie Langton M.D.   On: 12/22/2021 10:52   CT Abdomen Pelvis W Contrast  Result Date: 12/22/2021 CLINICAL DATA:  Primary Cancer Type: Lung Imaging Indication: Assess response to therapy Interval therapy since last imaging? Yes Initial Cancer Diagnosis Date: 04/24/2021; Established by: Biopsy-proven Detailed Pathology: Stage IV non-small cell lung cancer, adenocarcinoma. Primary Tumor location:  Right upper and right lower lobe. Surgeries: Hysterectomy. Chemotherapy: Yes; Ongoing? Yes; Most recent administration: 12/06/2021 Immunotherapy? No Radiation therapy? No EXAM: CT CHEST, ABDOMEN, AND PELVIS WITH CONTRAST TECHNIQUE: Multidetector CT imaging of the chest, abdomen and pelvis was performed following the standard protocol during bolus administration of intravenous contrast. RADIATION DOSE REDUCTION: This exam was performed according to the departmental dose-optimization program which includes automated exposure control, adjustment of the mA and/or kV according to patient size and/or use of iterative reconstruction technique. CONTRAST:  184mL OMNIPAQUE IOHEXOL 300 MG/ML  SOLN COMPARISON:  Most recent CT chest, abdomen and pelvis 10/20/2021. 03/06/2021 PET-CT. FINDINGS: CT CHEST FINDINGS Cardiovascular: Heart size is normal. There is no significant pericardial fluid, thickening or pericardial calcification. Atherosclerotic calcifications in the thoracic aorta. No definite coronary artery calcifications. Mediastinum/Nodes: No pathologically enlarged mediastinal or hilar lymph nodes. Esophagus is unremarkable in appearance. No axillary lymphadenopathy.  Lungs/Pleura: Again noted are multiple tiny 2-5 mm solid-appearing pulmonary nodules scattered throughout the lungs bilaterally, which are stable in size and number. In addition, there are multiple sub solid lesions in the lungs bilaterally which are grossly similar compared to the recent prior study. Specific examples include a sub solid right lower lobe lesion (axial image 61 of series 4) which is predominantly cystic in appearance and measures 1.5 x 1.1 cm (unchanged), a 2.1 x 1.3 cm lesion (axial image 40 of series 4) in the periphery of the left upper lobe (unchanged), and several other smaller lesions which are also grossly unchanged. No new suspicious appearing pulmonary nodules or masses are otherwise noted. No acute consolidative airspace disease. No pleural effusions. Mild diffuse bronchial wall thickening with very mild centrilobular and paraseptal emphysema. Musculoskeletal: There are no aggressive appearing lytic or blastic lesions noted in the visualized portions of the skeleton. CT ABDOMEN PELVIS FINDINGS Hepatobiliary: No suspicious cystic or solid hepatic lesions. No intra or extrahepatic biliary ductal dilatation. Gallbladder is normal in appearance. Pancreas: No pancreatic mass. No pancreatic ductal dilatation. No pancreatic or peripancreatic fluid collections or inflammatory changes. Spleen: Unremarkable. Adrenals/Urinary Tract: Low-attenuation adrenal nodules bilaterally, similar to prior examinations, previously not hypermetabolic on prior PET-CT and characterized as benign adenomas, measuring 1.6 x 2.0 cm on the right and 2.8 x 1.4 cm on the left. Bilateral kidneys are normal in appearance. No hydroureteronephrosis. Urinary bladder is normal in appearance. Stomach/Bowel: The appearance of the stomach is normal. No pathologic dilatation of small bowel or colon. Normal appendix. Vascular/Lymphatic: Aortic atherosclerosis, without evidence of aneurysm or dissection in the abdominal or pelvic  vasculature. No lymphadenopathy noted in the abdomen or pelvis. Reproductive: Status post hysterectomy. Ovaries are not confidently identified may be surgically absent or atrophic. Other: No significant volume of ascites.  No pneumoperitoneum. Musculoskeletal: There are no aggressive appearing lytic or blastic lesions noted in the visualized portions of the skeleton. IMPRESSION: 1. Stable number and size of pulmonary nodules, as above, which remain highly concerning for multifocal primary bronchogenic adenocarcinoma. 2. No significant lymphadenopathy or definite signs of metastatic disease identified in the abdomen or pelvis. 3. Mild diffuse bronchial wall thickening with very mild centrilobular and paraseptal emphysema; imaging findings suggestive of underlying COPD. 4. Aortic atherosclerosis. 5. Bilateral adrenal adenomas again noted. 6. Additional incidental findings, similar to prior studies, as above. Electronically Signed   By: Vinnie Langton M.D.   On: 12/22/2021 10:52     ASSESSMENT/PLAN:  This is a very pleasant 51 year old Caucasian female diagnosed with stage IV (T4, N2, M1 a) non-small cell lung cancer, adenocarcinoma presented with multifocal disease involving the right upper lobe, right lower lobe as well as suspicious lower paratracheal lymphadenopathy and groundglass nodules in the left lung diagnosed in May 2022. The patient had molecular studies performed by [facility] and that showed no detectable mutation but this is likely secondary to low-level circulating free tumor DNA.    Since the patient has a history of systemic lupus erythematosus, she is not a candidate for immunotherapy.    The patient is currently undergoing systemic chemotherapy with carboplatin for an AUC 5, Alimta 500 mg  per metered square, and and Avastin 1500 mg/kg IV every 3 weeks.  She is status post 10 cycles.  Starting from cycle #7, the patient has been on maintenance treatment with Alimta and Avastin.   Labs  were reviewed. Her LFTs are mildly elevated today. She took several tylenol yesterday due to abdominal cramping. Her abdominal symptoms have resolved. Recommend that she proceed with cycle #11 today as scheduled. She was advised to try to limit her tylenol use and we will continue to monitor her labs closely.    We will see her back for a follow up visit in 3 weeks for evaluation before starting cycle #12.   She recently was diagnosed with osteoporosis and is going to see a specialist. I let her know that it is ok to continue with their recommendations.   The patient states she may have slight increase in frequency of headaches. She attributes her symptoms to change in weather. If any further new or worsening headaches, then we can consider repeat imaging. Will monitor for now. She states she has not been taking her migraine medication recently.    The patient was advised to call immediately if she has any concerning symptoms in the interval. The patient voices understanding of current disease status and treatment options and is in agreement with the current care plan. All questions were answered. The patient knows to call the clinic with any problems, questions or concerns. We can certainly see the patient much sooner if necessary    No orders of the defined types were placed in this encounter.     The total time spent in the appointment was 20-29 minutes.   Johnye Kist L Leiann Sporer, PA-C 01/17/22

## 2022-01-17 ENCOUNTER — Inpatient Hospital Stay: Payer: Commercial Managed Care - PPO | Attending: Hematology and Oncology

## 2022-01-17 ENCOUNTER — Other Ambulatory Visit: Payer: Self-pay

## 2022-01-17 ENCOUNTER — Inpatient Hospital Stay: Payer: Commercial Managed Care - PPO | Admitting: Physician Assistant

## 2022-01-17 ENCOUNTER — Inpatient Hospital Stay: Payer: Commercial Managed Care - PPO

## 2022-01-17 ENCOUNTER — Encounter: Payer: Self-pay | Admitting: Physician Assistant

## 2022-01-17 VITALS — BP 136/78 | HR 77 | Temp 97.0°F | Resp 17 | Wt 117.0 lb

## 2022-01-17 DIAGNOSIS — C3411 Malignant neoplasm of upper lobe, right bronchus or lung: Secondary | ICD-10-CM | POA: Insufficient documentation

## 2022-01-17 DIAGNOSIS — Z5112 Encounter for antineoplastic immunotherapy: Secondary | ICD-10-CM | POA: Diagnosis not present

## 2022-01-17 DIAGNOSIS — Z5111 Encounter for antineoplastic chemotherapy: Secondary | ICD-10-CM

## 2022-01-17 DIAGNOSIS — M329 Systemic lupus erythematosus, unspecified: Secondary | ICD-10-CM | POA: Insufficient documentation

## 2022-01-17 DIAGNOSIS — C3491 Malignant neoplasm of unspecified part of right bronchus or lung: Secondary | ICD-10-CM

## 2022-01-17 LAB — CBC WITH DIFFERENTIAL (CANCER CENTER ONLY)
Abs Immature Granulocytes: 0.07 10*3/uL (ref 0.00–0.07)
Basophils Absolute: 0 10*3/uL (ref 0.0–0.1)
Basophils Relative: 0 %
Eosinophils Absolute: 0 10*3/uL (ref 0.0–0.5)
Eosinophils Relative: 0 %
HCT: 40.9 % (ref 36.0–46.0)
Hemoglobin: 13.7 g/dL (ref 12.0–15.0)
Immature Granulocytes: 1 %
Lymphocytes Relative: 7 %
Lymphs Abs: 1 10*3/uL (ref 0.7–4.0)
MCH: 32.5 pg (ref 26.0–34.0)
MCHC: 33.5 g/dL (ref 30.0–36.0)
MCV: 96.9 fL (ref 80.0–100.0)
Monocytes Absolute: 0.6 10*3/uL (ref 0.1–1.0)
Monocytes Relative: 5 %
Neutro Abs: 12.6 10*3/uL — ABNORMAL HIGH (ref 1.7–7.7)
Neutrophils Relative %: 87 %
Platelet Count: 244 10*3/uL (ref 150–400)
RBC: 4.22 MIL/uL (ref 3.87–5.11)
RDW: 14.5 % (ref 11.5–15.5)
WBC Count: 14.4 10*3/uL — ABNORMAL HIGH (ref 4.0–10.5)
nRBC: 0 % (ref 0.0–0.2)

## 2022-01-17 LAB — CMP (CANCER CENTER ONLY)
ALT: 111 U/L — ABNORMAL HIGH (ref 0–44)
AST: 68 U/L — ABNORMAL HIGH (ref 15–41)
Albumin: 4.6 g/dL (ref 3.5–5.0)
Alkaline Phosphatase: 117 U/L (ref 38–126)
Anion gap: 8 (ref 5–15)
BUN: 16 mg/dL (ref 6–20)
CO2: 22 mmol/L (ref 22–32)
Calcium: 10.4 mg/dL — ABNORMAL HIGH (ref 8.9–10.3)
Chloride: 107 mmol/L (ref 98–111)
Creatinine: 0.72 mg/dL (ref 0.44–1.00)
GFR, Estimated: >60 mL/min (ref >60)
Glucose, Bld: 107 mg/dL — ABNORMAL HIGH (ref 70–99)
Potassium: 4.2 mmol/L (ref 3.5–5.1)
Sodium: 137 mmol/L (ref 135–145)
Total Bilirubin: 0.3 mg/dL (ref 0.3–1.2)
Total Protein: 7.9 g/dL (ref 6.5–8.1)

## 2022-01-17 LAB — TOTAL PROTEIN, URINE DIPSTICK: Protein, ur: NEGATIVE mg/dL

## 2022-01-17 MED ORDER — SODIUM CHLORIDE 0.9 % IV SOLN
15.0000 mg/kg | Freq: Once | INTRAVENOUS | Status: AC
  Administered 2022-01-17: 800 mg via INTRAVENOUS
  Filled 2022-01-17: qty 32

## 2022-01-17 MED ORDER — CYANOCOBALAMIN 1000 MCG/ML IJ SOLN
1000.0000 ug | Freq: Once | INTRAMUSCULAR | Status: AC
  Administered 2022-01-17: 1000 ug via INTRAMUSCULAR
  Filled 2022-01-17: qty 1

## 2022-01-17 MED ORDER — SODIUM CHLORIDE 0.9 % IV SOLN: Freq: Once | INTRAVENOUS | Status: AC

## 2022-01-17 MED ORDER — PROCHLORPERAZINE MALEATE 10 MG PO TABS
10.0000 mg | ORAL_TABLET | Freq: Once | ORAL | Status: AC
  Administered 2022-01-17: 10 mg via ORAL
  Filled 2022-01-17: qty 1

## 2022-01-17 MED ORDER — SODIUM CHLORIDE 0.9 % IV SOLN
500.0000 mg/m2 | Freq: Once | INTRAVENOUS | Status: AC
  Administered 2022-01-17: 800 mg via INTRAVENOUS
  Filled 2022-01-17: qty 20

## 2022-01-17 NOTE — Progress Notes (Signed)
Okay to treat with ALT 111 U/L per Cassie PA

## 2022-02-05 NOTE — Progress Notes (Signed)
[facility] OFFICE PROGRESS NOTE  Cyndi Bender, PA-C [place] [address]  DIAGNOSIS: Stage IV (T4, N2, M1 a) non-small cell lung cancer, adenocarcinoma presented with multifocal disease involving the right upper lobe, right lower lobe as well as suspicious lower paratracheal lymphadenopathy and groundglass nodules in the left lung diagnosed in May 2022. The patient had molecular studies performed by [facility] and that showed no detectable mutation but this is likely secondary to low-level circulating free tumor DNA.  PRIOR THERAPY: None  CURRENT THERAPY: Palliative systemic chemotherapy with carboplatin for AUC of 5, Alimta 500 Mg/M2 and Avastin 15 Mg/KG every 3 weeks.  The patient is not a great candidate for immunotherapy because of her history of active systemic lupus erythematosus.  She is status post 11 cycles. Starting from cycle #6, the patient will start maintenance Alimta and Avastin.    INTERVAL HISTORY: Kirsten Williams 51 y.o. female returns to the clinic today for a follow-up visit accompanied by her husband. The patient is feeling fairly well today without any concerning complaints except she sometimes continues to have left upper quadrant/left rib pain. She uses her oxycodone sparingly. She tries to take tylenol and will reserve oxycodone if needed. This was last prescribed in December. She is running low. She also is going to see an orthopedic provider on Thursday. She had an old injury in her right foot with some sharp shooting pains. However, since her last infusion, she noticed some increased swelling and pain.   She is tolerating her treatment well without any concerning adverse side effects except she has some fatigue though week following treatment.  She denies any fever or chills.  She has occasional night sweats which is not new for her about 2-3 days after her treatment. Her appetite is "up and down", she gained a few pounds since her  last appointment. She denies any significant shortness of breath or hemoptysis. She denies any chest pain. She has an occasional mild cough which produces clear sputum.  She sometimes has a sensation of tightness around her diaphragm the first week following treatment.  She has mild nausea which is controlled with her antiemetic, but denies any nausea in the interval since her last infusion.  She denies vomiting. She generally has chronic diarrhea which is normal for her. She denies any abnormal bleeding or bruising.  She is prone to headaches and has a history of migraines, but denies any significant headaches in the interval. She has some blotchy skin darkening on her back. Denies pain or significant itching. Her husband is wondering what this is. She is here today for evaluation before starting cycle #12     MEDICAL HISTORY: Past Medical History:  Diagnosis Date   Adenocarcinoma, lung, right (Kirsten Williams) 05/03/2021   Kirsten Williams is a 51 y.o. female with a history of lung nodules dating back to 2019 who is referred in consultation with Cyndi Bender, PA-C for assessment and management. She is a former smoker having quit in 2016. 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 She missed her screening CT last year and imaging was ordered last month. CT chest from 02-16-2021 reveals pro   Anxiety    GERD (gastroesophageal reflux disease)    SLE (systemic lupus erythematosus) ([facility]) 05/03/2021   SLE (systemic lupus erythematosus) ([facility]) 05/03/2021    ALLERGIES:  is allergic to carboplatin.  MEDICATIONS:  Current Outpatient Medications  Medication Sig Dispense Refill   acetaminophen (  TYLENOL) 325 MG tablet Take 975 mg by mouth every 6 (six) hours as needed for moderate pain or headache.     Calcium Carbonate-Vit D-Min (CALCIUM 1200 PO) Take 1 capsule by mouth daily.     cholecalciferol (VITAMIN D3) 25 MCG (1000 UNIT) tablet Take 2,000 Units by mouth daily.     cyclobenzaprine  (FLEXERIL) 10 MG tablet Take 10 mg by mouth at bedtime as needed for muscle spasms.     dexamethasone (DECADRON) 4 MG tablet 4 mg p.o. twice daily the day before, day of and day after the chemotherapy every 3 weeks. 40 tablet 1   dicyclomine (BENTYL) 10 MG capsule Take 10 mg by mouth every 6 (six) hours as needed.     diphenhydrAMINE (BENADRYL) 25 MG tablet Take 25 mg by mouth daily as needed for allergies.     folic acid (FOLVITE) 1 MG tablet TAKE 1 TABLET(1 MG) BY MOUTH DAILY 30 tablet 4   hydrocortisone 1 % lotion Apply 1 application topically 2 (two) times daily. 118 mL 0   hydroxychloroquine (PLAQUENIL) 200 MG tablet Take 200 mg by mouth 2 (two) times daily.     Multiple Vitamins-Minerals (MULTI ADULT GUMMIES) CHEW Chew 2 tablets by mouth daily.     omeprazole (PRILOSEC) 40 MG capsule Take 40 mg by mouth daily as needed (acid reflux).     ondansetron (ZOFRAN) 4 MG tablet Take 8 mg by mouth 2 (two) times daily as needed for vomiting or nausea.     oxyCODONE-acetaminophen (PERCOCET/ROXICET) 5-325 MG tablet Take 1 tablet by mouth every 8 (eight) hours as needed for severe pain. 30 tablet 0   prochlorperazine (COMPAZINE) 10 MG tablet Take 1 tablet (10 mg total) by mouth every 6 (six) hours as needed. 30 tablet 2   rizatriptan (MAXALT-MLT) 10 MG disintegrating tablet Take 10 mg by mouth 2 (two) times daily as needed.     tetrahydrozoline 0.05 % ophthalmic solution Place 1 drop into both eyes once. Systane Eye Drops once daily both eyes     triamcinolone ointment (KENALOG) 0.5 % Apply topically 3 (three) times daily.     No current facility-administered medications for this visit.    SURGICAL HISTORY:  Past Surgical History:  Procedure Laterality Date   ABDOMINAL HYSTERECTOMY     BRONCHIAL BIOPSY  04/24/2021   Procedure: BRONCHIAL BIOPSIES;  Surgeon: Collene Gobble, MD;  Location: [facility] ENDOSCOPY;  Service: Pulmonary;;   BRONCHIAL BRUSHINGS  04/24/2021   Procedure: BRONCHIAL BRUSHINGS;  Surgeon:  Collene Gobble, MD;  Location: [facility] ENDOSCOPY;  Service: Pulmonary;;   BRONCHIAL NEEDLE ASPIRATION BIOPSY  04/24/2021   Procedure: BRONCHIAL NEEDLE ASPIRATION BIOPSIES;  Surgeon: Collene Gobble, MD;  Location: [facility];  Service: Pulmonary;;   BRONCHIAL WASHINGS  04/24/2021   Procedure: BRONCHIAL WASHINGS;  Surgeon: Collene Gobble, MD;  Location: [facility];  Service: Pulmonary;;   CESAREAN SECTION  11/09/1990   DILATION AND CURETTAGE OF UTERUS Bilateral 09/09/1996   FIDUCIAL MARKER PLACEMENT  04/24/2021   Procedure: FIDUCIAL MARKER PLACEMENT;  Surgeon: Collene Gobble, MD;  Location: [facility] ENDOSCOPY;  Service: Pulmonary;;   TONSILLECTOMY  12/10/1977   VIDEO BRONCHOSCOPY WITH ENDOBRONCHIAL NAVIGATION Bilateral 04/24/2021   Procedure: VIDEO BRONCHOSCOPY WITH ENDOBRONCHIAL NAVIGATION;  Surgeon: Collene Gobble, MD;  Location: [facility] ENDOSCOPY;  Service: Pulmonary;  Laterality: Bilateral;    REVIEW OF SYSTEMS:   Constitutional: Positive for fatigue 1 week following treatment. Negative for appetite change, chills, fever and unexpected weight change.  HENT:  Negative for mouth sores, nosebleeds, sore throat and trouble swallowing.   Eyes: Negative for eye problems and icterus.  Respiratory: Positive for mild cough. Negative for hemoptysis, shortness of breath and wheezing.   Cardiovascular: Negative for chest pain and leg swelling.  Gastrointestinal: Positive for occasional nausea. Positive for chronic diarrhea. Positive for occasional left lower rib/LUQ pain.  Genitourinary: Negative for bladder incontinence, difficulty urinating, dysuria, frequency and hematuria.   Musculoskeletal:  Positive for occasional sharp shooting pain down her foot. Negative for back pain, gait problem, neck pain and neck stiffness.  Skin: Negative for rash or itching.  Neurological: Negative for dizziness, extremity weakness, gait problem, headaches, light-headedness and seizures.  Hematological: Negative for adenopathy.  Does not bruise/bleed easily.  Psychiatric/Behavioral: Negative for confusion, depression and sleep disturbance. The patient is not nervous/anxious.      PHYSICAL EXAMINATION:  Blood pressure (!) 141/86, pulse 94, temperature 97.8 F (36.6 C), temperature source Tympanic, resp. rate 17, weight 119 lb 1.6 oz (54 kg), SpO2 99 %.  ECOG PERFORMANCE STATUS: 1  Physical Exam  Constitutional: Oriented to person, place, and time and well-developed, well-nourished, and in no distress.  HENT:  Head: Normocephalic and atraumatic.  Mouth/Throat: Oropharynx is clear and moist. No oropharyngeal exudate.  Eyes: Conjunctivae are normal. Right eye exhibits no discharge. Left eye exhibits no discharge. No scleral icterus.  Neck: Normal range of motion. Neck supple.  Cardiovascular: Normal rate, regular rhythm, normal heart sounds and intact distal pulses.   Pulmonary/Chest: Effort normal and breath sounds normal. No respiratory distress. No wheezes. No rales.  Abdominal: Soft. Bowel sounds are normal. Exhibits no distension and no mass. There is no tenderness.  Musculoskeletal: Normal range of motion. Exhibits no edema.  Lymphadenopathy:    No cervical adenopathy.  Neurological: Alert and oriented to person, place, and time. Exhibits normal muscle tone. Gait normal. Coordination normal.  Skin: Skin is warm and dry. Positive for skin darkening on back. No pain or itching. No raised rashes. No rash noted. Not diaphoretic. No erythema. No pallor.  Psychiatric: Mood, memory and judgment normal.  Vitals reviewed.  LABORATORY DATA: Lab Results  Component Value Date   WBC 11.5 (H) 02/07/2022   HGB 13.7 02/07/2022   HCT 40.6 02/07/2022   MCV 96.9 02/07/2022   PLT 259 02/07/2022      Chemistry      Component Value Date/Time   NA 137 02/07/2022 1028   NA 140 03/13/2021 0000   K 3.5 02/07/2022 1028   CL 101 02/07/2022 1028   CO2 23 02/07/2022 1028   BUN 13 02/07/2022 1028   BUN 10 03/13/2021 0000    CREATININE 0.79 02/07/2022 1028   GLU 107 03/13/2021 0000      Component Value Date/Time   CALCIUM 10.1 02/07/2022 1028   ALKPHOS 107 02/07/2022 1028   AST 29 02/07/2022 1028   ALT 58 (H) 02/07/2022 1028   BILITOT 0.3 02/07/2022 1028       RADIOGRAPHIC STUDIES:  No results found.   ASSESSMENT/PLAN:  This is a very pleasant 51 year old Caucasian female diagnosed with stage IV (T4, N2, M1 a) non-small cell lung cancer, adenocarcinoma presented with multifocal disease involving the right upper lobe, right lower lobe as well as suspicious lower paratracheal lymphadenopathy and groundglass nodules in the left lung diagnosed in May 2022. The patient had molecular studies performed by [facility] and that showed no detectable mutation but this is likely secondary to low-level circulating free tumor DNA.  Since the patient has a history of systemic lupus erythematosus, she is not a candidate for immunotherapy.   The patient is currently undergoing systemic chemotherapy with carboplatin for an AUC 5, Alimta 500 mg per metered square, and and Avastin 1500 mg/kg IV every 3 weeks.  She is status post 11 cycles.  Starting from cycle #7, the patient has been on maintenance treatment with Alimta and Avastin.  I will arrange for a restaging CT scan of the chest, abdomen, and pelvis prior to starting her next cycle of treatment. We will also see if there is an etiology of her occasional left rib/LUQ discomfort. No clear signs of an etiology on her previous imaging studies.   We will see her back for a follow up visit in 3 weeks for evaluation and to review her scan before starting cycle #13.   For her right foot swelling/pain, I will arrange for ultrasound of the right lower extremity to rule out DVT.   I will refill her oxycodone.   She was asking about a port-a-cath today. I discussed I would be happy to arrange for that if interested; however, I would have to time it in between her  treatments as Avastin can cause delayed wound healing. She will continue to think about it and will let us know if she wants to proceed.   She has some skin darkening on her back that is blotchy appearing. No pain or itching. It may be from her Alimta which can cause skin darkening. We will monitor this for now.   The patient and her husband had questions regarding prognosis and length of treatment. Discussed we continue on treatment as long as there is stability. If it appears treatment is not working, then we would have to discuss changing treatment. Also, re-discussed, in her situation we would probably re-test for molecular studies to see if she has a targeted mutation. Her first test did not have enough circulating tumor to obtain any results.   The patient was advised to call immediately if she has any concerning symptoms in the interval. The patient voices understanding of current disease status and treatment options and is in agreement with the current care plan. All questions were answered. The patient knows to call the clinic with any problems, questions or concerns. We can certainly see the patient much sooner if necessary        Orders Placed This Encounter  Procedures   CT Chest W Contrast    Standing Status:   Future    Standing Expiration Date:   02/07/2023    Order Specific Question:   If indicated for the ordered procedure, I authorize the administration of contrast media per Radiology protocol    Answer:   Yes    Order Specific Question:   Is patient pregnant?    Answer:   No    Order Specific Question:   Preferred imaging location?    Answer:   [facility]   CT Abdomen Pelvis W Contrast    Standing Status:   Future    Standing Expiration Date:   02/07/2023    Order Specific Question:   If indicated for the ordered procedure, I authorize the administration of contrast media per Radiology protocol    Answer:   Yes    Order Specific Question:   Is patient  pregnant?    Answer:   No    Order Specific Question:   Preferred imaging location?    Answer:  [facility]    Order Specific Question:   Is Oral Contrast requested for this exam?    Answer:   Yes, Per Radiology protocol     The total time spent in the appointment was 30-39 minutes.   Domnique Vanegas L Royer Cristobal, PA-C 02/07/22

## 2022-02-07 ENCOUNTER — Inpatient Hospital Stay: Payer: Commercial Managed Care - PPO | Admitting: Physician Assistant

## 2022-02-07 ENCOUNTER — Inpatient Hospital Stay: Payer: Commercial Managed Care - PPO

## 2022-02-07 ENCOUNTER — Inpatient Hospital Stay: Payer: Commercial Managed Care - PPO | Attending: Hematology and Oncology

## 2022-02-07 ENCOUNTER — Other Ambulatory Visit: Payer: Self-pay

## 2022-02-07 VITALS — BP 141/86 | HR 94 | Temp 97.8°F | Resp 17 | Wt 119.1 lb

## 2022-02-07 DIAGNOSIS — Z5111 Encounter for antineoplastic chemotherapy: Secondary | ICD-10-CM | POA: Diagnosis present

## 2022-02-07 DIAGNOSIS — M329 Systemic lupus erythematosus, unspecified: Secondary | ICD-10-CM | POA: Insufficient documentation

## 2022-02-07 DIAGNOSIS — C3491 Malignant neoplasm of unspecified part of right bronchus or lung: Secondary | ICD-10-CM | POA: Diagnosis not present

## 2022-02-07 DIAGNOSIS — Z5112 Encounter for antineoplastic immunotherapy: Secondary | ICD-10-CM | POA: Diagnosis not present

## 2022-02-07 DIAGNOSIS — G893 Neoplasm related pain (acute) (chronic): Secondary | ICD-10-CM | POA: Diagnosis not present

## 2022-02-07 DIAGNOSIS — C3411 Malignant neoplasm of upper lobe, right bronchus or lung: Secondary | ICD-10-CM | POA: Insufficient documentation

## 2022-02-07 LAB — CBC WITH DIFFERENTIAL (CANCER CENTER ONLY)
Abs Immature Granulocytes: 0.03 10*3/uL (ref 0.00–0.07)
Basophils Absolute: 0 10*3/uL (ref 0.0–0.1)
Basophils Relative: 0 %
Eosinophils Absolute: 0 10*3/uL (ref 0.0–0.5)
Eosinophils Relative: 0 %
HCT: 40.6 % (ref 36.0–46.0)
Hemoglobin: 13.7 g/dL (ref 12.0–15.0)
Immature Granulocytes: 0 %
Lymphocytes Relative: 8 %
Lymphs Abs: 0.9 10*3/uL (ref 0.7–4.0)
MCH: 32.7 pg (ref 26.0–34.0)
MCHC: 33.7 g/dL (ref 30.0–36.0)
MCV: 96.9 fL (ref 80.0–100.0)
Monocytes Absolute: 0.4 10*3/uL (ref 0.1–1.0)
Monocytes Relative: 3 %
Neutro Abs: 10.2 10*3/uL — ABNORMAL HIGH (ref 1.7–7.7)
Neutrophils Relative %: 89 %
Platelet Count: 259 10*3/uL (ref 150–400)
RBC: 4.19 MIL/uL (ref 3.87–5.11)
RDW: 14.5 % (ref 11.5–15.5)
WBC Count: 11.5 10*3/uL — ABNORMAL HIGH (ref 4.0–10.5)
nRBC: 0 % (ref 0.0–0.2)

## 2022-02-07 LAB — TOTAL PROTEIN, URINE DIPSTICK: Protein, ur: NEGATIVE mg/dL

## 2022-02-07 LAB — CMP (CANCER CENTER ONLY)
ALT: 58 U/L — ABNORMAL HIGH (ref 0–44)
AST: 29 U/L (ref 15–41)
Albumin: 4.4 g/dL (ref 3.5–5.0)
Alkaline Phosphatase: 107 U/L (ref 38–126)
Anion gap: 13 (ref 5–15)
BUN: 13 mg/dL (ref 6–20)
CO2: 23 mmol/L (ref 22–32)
Calcium: 10.1 mg/dL (ref 8.9–10.3)
Chloride: 101 mmol/L (ref 98–111)
Creatinine: 0.79 mg/dL (ref 0.44–1.00)
GFR, Estimated: >60 mL/min (ref >60)
Glucose, Bld: 177 mg/dL — ABNORMAL HIGH (ref 70–99)
Potassium: 3.5 mmol/L (ref 3.5–5.1)
Sodium: 137 mmol/L (ref 135–145)
Total Bilirubin: 0.3 mg/dL (ref 0.3–1.2)
Total Protein: 7.7 g/dL (ref 6.5–8.1)

## 2022-02-07 MED ORDER — SODIUM CHLORIDE 0.9 % IV SOLN
15.0000 mg/kg | Freq: Once | INTRAVENOUS | Status: AC
  Administered 2022-02-07: 800 mg via INTRAVENOUS
  Filled 2022-02-07: qty 32

## 2022-02-07 MED ORDER — SODIUM CHLORIDE 0.9 % IV SOLN
500.0000 mg/m2 | Freq: Once | INTRAVENOUS | Status: AC
  Administered 2022-02-07: 800 mg via INTRAVENOUS
  Filled 2022-02-07: qty 20

## 2022-02-07 MED ORDER — PROCHLORPERAZINE MALEATE 10 MG PO TABS
10.0000 mg | ORAL_TABLET | Freq: Once | ORAL | Status: AC
  Administered 2022-02-07: 10 mg via ORAL
  Filled 2022-02-07: qty 1

## 2022-02-07 MED ORDER — OXYCODONE-ACETAMINOPHEN 5-325 MG PO TABS: 1.0000 | ORAL_TABLET | Freq: Three times a day (TID) | ORAL | 0 refills | Status: DC | PRN

## 2022-02-07 MED ORDER — SODIUM CHLORIDE 0.9 % IV SOLN: Freq: Once | INTRAVENOUS | Status: AC

## 2022-02-07 NOTE — Patient Instructions (Signed)
Kirsten Williams   ?Discharge Instructions: ?Thank you for choosing [facility] to provide your oncology and hematology care.  ? ?If you have a lab appointment with the [facility], please go directly to the [facility] and check in at the registration area. ?  ?Wear comfortable clothing and clothing appropriate for easy access to any Portacath or PICC line.  ? ?We strive to give you quality time with your provider. You may need to reschedule your appointment if you arrive late (15 or more minutes).  Arriving late affects you and other patients whose appointments are after yours.  Also, if you miss three or more appointments without notifying the office, you may be dismissed from the clinic at the provider?s discretion.    ?  ?For prescription refill requests, have your pharmacy contact our office and allow 72 hours for refills to be completed.   ? ?Today you received the following chemotherapy and/or immunotherapy agents : bevacizumab-awwb and pemetrexed    ?  ?To help prevent nausea and vomiting after your treatment, we encourage you to take your nausea medication as directed. ? ?BELOW ARE SYMPTOMS THAT SHOULD BE REPORTED IMMEDIATELY: ?*FEVER GREATER THAN 100.4 F (38 ?C) OR HIGHER ?*CHILLS OR SWEATING ?*NAUSEA AND VOMITING THAT IS NOT CONTROLLED WITH YOUR NAUSEA MEDICATION ?*UNUSUAL SHORTNESS OF BREATH ?*UNUSUAL BRUISING OR BLEEDING ?*URINARY PROBLEMS (pain or burning when urinating, or frequent urination) ?*BOWEL PROBLEMS (unusual diarrhea, constipation, pain near the anus) ?TENDERNESS IN MOUTH AND THROAT WITH OR WITHOUT PRESENCE OF ULCERS (sore throat, sores in mouth, or a toothache) ?UNUSUAL RASH, SWELLING OR PAIN  ?UNUSUAL VAGINAL DISCHARGE OR ITCHING  ? ?Items with * indicate a potential emergency and should be followed up as soon as possible or go to the Emergency Department if any problems should occur. ? ?Please show the CHEMOTHERAPY ALERT CARD or IMMUNOTHERAPY  ALERT CARD at check-in to the Emergency Department and triage nurse. ? ?Should you have questions after your visit or need to cancel or reschedule your appointment, please contact [facility]  Dept: [phone]  and follow the prompts.  Office hours are 8:00 a.m. to 4:30 p.m. Monday - Friday. Please note that voicemails left after 4:00 p.m. may not be returned until the following business day.  We are closed weekends and major holidays. You have access to a nurse at all times for urgent questions. Please call the main number to the clinic Dept: [phone] and follow the prompts. ? ? ?For any non-urgent questions, you may also contact your provider using MyChart. We now offer e-Visits for anyone 80 and older to request care online for non-urgent symptoms. For details visit [email or web address]. ?  ?Also download the MyChart app! Go to the app store, search "MyChart", open the app, select [facility], and log in with your MyChart username and password. ? ?Due to Covid, a mask is required upon entering the hospital/clinic. If you do not have a mask, one will be given to you upon arrival. For doctor visits, patients may have 1 support person aged 35 or older with them. For treatment visits, patients cannot have anyone with them due to current Covid guidelines and our immunocompromised population.  ? ?

## 2022-02-09 ENCOUNTER — Other Ambulatory Visit: Payer: Self-pay

## 2022-02-09 ENCOUNTER — Ambulatory Visit (HOSPITAL_COMMUNITY)
Admission: RE | Admit: 2022-02-09 | Discharge: 2022-02-09 | Disposition: A | Discharge status: Home / Self Care | Payer: Commercial Managed Care - PPO | Source: Ambulatory Visit | Attending: Physician Assistant | Admitting: Physician Assistant

## 2022-02-09 ENCOUNTER — Telehealth: Payer: Self-pay | Admitting: Physician Assistant

## 2022-02-09 DIAGNOSIS — C3491 Malignant neoplasm of unspecified part of right bronchus or lung: Secondary | ICD-10-CM | POA: Insufficient documentation

## 2022-02-09 NOTE — Progress Notes (Signed)
Right lower extremity venous duplex has been completed. ?Preliminary results can be found in CV Proc through chart review.  ?Results were given to [facility] PA. ? ?02/09/22 8:53 AM ?Kirsten Williams RVT   ?

## 2022-02-09 NOTE — Telephone Encounter (Signed)
The patient's Doppler ultrasound from this morning was negative for DVT.  I called the patient and left a voicemail of this information.  She did tell me a few days ago that she has an old injury in the right foot/ankle.  She is expected to see orthopedic provider in the near future.  I recommend that she follow-up with them for further evaluation regarding her foot pain and swelling.  In the meantime, encouraged her to elevate, use compression, and ice if needed.  I left our callback number if she has any questions regarding this voicemail. ?

## 2022-02-15 DIAGNOSIS — M069 Rheumatoid arthritis, unspecified: Secondary | ICD-10-CM | POA: Insufficient documentation

## 2022-02-15 DIAGNOSIS — C3491 Malignant neoplasm of unspecified part of right bronchus or lung: Secondary | ICD-10-CM | POA: Insufficient documentation

## 2022-02-23 ENCOUNTER — Ambulatory Visit (HOSPITAL_COMMUNITY)
Admission: RE | Admit: 2022-02-23 | Discharge: 2022-02-23 | Disposition: A | Discharge status: Home / Self Care | Payer: Commercial Managed Care - PPO | Source: Ambulatory Visit | Attending: Physician Assistant | Admitting: Physician Assistant

## 2022-02-23 ENCOUNTER — Other Ambulatory Visit: Payer: Self-pay

## 2022-02-23 DIAGNOSIS — C3491 Malignant neoplasm of unspecified part of right bronchus or lung: Secondary | ICD-10-CM | POA: Diagnosis present

## 2022-02-23 MED ORDER — IOHEXOL 300 MG/ML  SOLN
100.0000 mL | Freq: Once | INTRAMUSCULAR | Status: AC | PRN
  Administered 2022-02-23: 100 mL via INTRAVENOUS

## 2022-02-23 MED ORDER — SODIUM CHLORIDE (PF) 0.9 % IJ SOLN
INTRAMUSCULAR | Status: AC
  Filled 2022-02-23: qty 50

## 2022-02-28 ENCOUNTER — Encounter: Payer: Self-pay | Admitting: *Deleted

## 2022-02-28 ENCOUNTER — Inpatient Hospital Stay: Payer: Commercial Managed Care - PPO

## 2022-02-28 ENCOUNTER — Other Ambulatory Visit: Payer: Self-pay

## 2022-02-28 ENCOUNTER — Inpatient Hospital Stay: Payer: Commercial Managed Care - PPO | Admitting: Internal Medicine

## 2022-02-28 VITALS — BP 137/82 | HR 93 | Temp 97.2°F | Resp 17 | Wt 119.2 lb

## 2022-02-28 DIAGNOSIS — C3491 Malignant neoplasm of unspecified part of right bronchus or lung: Secondary | ICD-10-CM

## 2022-02-28 DIAGNOSIS — Z5111 Encounter for antineoplastic chemotherapy: Secondary | ICD-10-CM

## 2022-02-28 DIAGNOSIS — Z5112 Encounter for antineoplastic immunotherapy: Secondary | ICD-10-CM | POA: Diagnosis not present

## 2022-02-28 LAB — CMP (CANCER CENTER ONLY)
ALT: 91 U/L — ABNORMAL HIGH (ref 0–44)
AST: 51 U/L — ABNORMAL HIGH (ref 15–41)
Albumin: 4.4 g/dL (ref 3.5–5.0)
Alkaline Phosphatase: 107 U/L (ref 38–126)
Anion gap: 11 (ref 5–15)
BUN: 14 mg/dL (ref 6–20)
CO2: 23 mmol/L (ref 22–32)
Calcium: 10.4 mg/dL — ABNORMAL HIGH (ref 8.9–10.3)
Chloride: 102 mmol/L (ref 98–111)
Creatinine: 0.75 mg/dL (ref 0.44–1.00)
GFR, Estimated: >60 mL/min (ref >60)
Glucose, Bld: 163 mg/dL — ABNORMAL HIGH (ref 70–99)
Potassium: 3.9 mmol/L (ref 3.5–5.1)
Sodium: 136 mmol/L (ref 135–145)
Total Bilirubin: 0.3 mg/dL (ref 0.3–1.2)
Total Protein: 7.5 g/dL (ref 6.5–8.1)

## 2022-02-28 LAB — CBC WITH DIFFERENTIAL (CANCER CENTER ONLY)
Abs Immature Granulocytes: 0.04 10*3/uL (ref 0.00–0.07)
Basophils Absolute: 0 10*3/uL (ref 0.0–0.1)
Basophils Relative: 0 %
Eosinophils Absolute: 0 10*3/uL (ref 0.0–0.5)
Eosinophils Relative: 0 %
HCT: 41 % (ref 36.0–46.0)
Hemoglobin: 13.7 g/dL (ref 12.0–15.0)
Immature Granulocytes: 0 %
Lymphocytes Relative: 8 %
Lymphs Abs: 1.1 10*3/uL (ref 0.7–4.0)
MCH: 32.3 pg (ref 26.0–34.0)
MCHC: 33.4 g/dL (ref 30.0–36.0)
MCV: 96.7 fL (ref 80.0–100.0)
Monocytes Absolute: 1.1 10*3/uL — ABNORMAL HIGH (ref 0.1–1.0)
Monocytes Relative: 8 %
Neutro Abs: 11.1 10*3/uL — ABNORMAL HIGH (ref 1.7–7.7)
Neutrophils Relative %: 84 %
Platelet Count: 277 10*3/uL (ref 150–400)
RBC: 4.24 MIL/uL (ref 3.87–5.11)
RDW: 14.4 % (ref 11.5–15.5)
WBC Count: 13.3 10*3/uL — ABNORMAL HIGH (ref 4.0–10.5)
nRBC: 0 % (ref 0.0–0.2)

## 2022-02-28 LAB — TOTAL PROTEIN, URINE DIPSTICK: Protein, ur: 30 mg/dL — AB

## 2022-02-28 MED ORDER — PROCHLORPERAZINE MALEATE 10 MG PO TABS
10.0000 mg | ORAL_TABLET | Freq: Once | ORAL | Status: AC
  Administered 2022-02-28: 10 mg via ORAL
  Filled 2022-02-28: qty 1

## 2022-02-28 MED ORDER — SODIUM CHLORIDE 0.9 % IV SOLN: Freq: Once | INTRAVENOUS | Status: AC

## 2022-02-28 MED ORDER — SODIUM CHLORIDE 0.9 % IV SOLN
15.0000 mg/kg | Freq: Once | INTRAVENOUS | Status: AC
  Administered 2022-02-28: 800 mg via INTRAVENOUS
  Filled 2022-02-28: qty 32

## 2022-02-28 MED ORDER — SODIUM CHLORIDE 0.9 % IV SOLN
500.0000 mg/m2 | Freq: Once | INTRAVENOUS | Status: AC
  Administered 2022-02-28: 800 mg via INTRAVENOUS
  Filled 2022-02-28: qty 20

## 2022-02-28 NOTE — Progress Notes (Signed)
Per Dr. Julien Nordmann, ok to treat with ALT of 91 U/L today. ?

## 2022-02-28 NOTE — Progress Notes (Signed)
Oncology Nurse Navigator Documentation ? ? ?  02/28/2022  ? 10:00 AM 08/02/2021  ? 10:00 AM  ?Oncology Nurse Navigator Flowsheets  ?Abnormal Finding Date  02/16/2021  ?Confirmed Diagnosis Date  04/24/2021  ?Diagnosis Status  Confirmed Diagnosis Complete  ?Planned Course of Treatment  Chemotherapy  ?Phase of Treatment  Chemo  ?Chemotherapy Actual Start Date:  06/21/2021  ?Navigator Follow Up Date:  08/02/2021  ?Navigator Follow Up Reason:  Follow-up Appointment  ?Navigator Location [facility]  ?Referral Date to RadOnc/MedOnc  05/31/2021  ?Navigator Encounter Type Treatment Treatment  ?Treatment Initiated Date  06/21/2021  ?Patient Visit Type MedOnc MedOnc  ?Treatment Phase Treatment Treatment  ?Barriers/Navigation Needs Education Education  ?Education Other Other  ?Interventions Education;Psycho-Social Support Education;Psycho-Social Support  ?Acuity Level 2-Minimal Needs (1-2 Barriers Identified) Level 2-Minimal Needs (1-2 Barriers Identified)  ?Education Method Verbal Verbal  ?Time Spent with Patient 30 30  ?  ?

## 2022-02-28 NOTE — Progress Notes (Signed)
?    [facility] ?[phone]   [phone] ? ?OFFICE PROGRESS NOTE ? ?Cyndi Bender, PA-C ?[address] ?[place] [address] ? ?DIAGNOSIS:  Stage IV (T4, N2, M1 a) non-small cell lung cancer, adenocarcinoma presented with multifocal disease involving the right upper lobe, right lower lobe as well as suspicious lower paratracheal lymphadenopathy and groundglass nodules in the left lung diagnosed in May 2022. ?The patient had molecular studies performed by [facility] and that showed no detectable mutation but this is likely secondary to low-level circulating free tumor DNA. ? ?PRIOR THERAPY: None. ? ?CURRENT THERAPY: palliative systemic chemotherapy with carboplatin for AUC of 5, Alimta 500 Mg/M2 and Avastin 15 Mg/KG every 3 weeks.  The patient is not a great candidate for immunotherapy because of her history of active systemic lupus erythematosus.  She is status post 12 cycles. ? ?INTERVAL HISTORY: ?Kirsten [facility] 51 y.o. female returns to the clinic today for follow-up visit.  The patient is feeling fine today with no concerning complaints except for intermittent pain at the left lower rib cage.  She denied having any shortness of breath, cough or hemoptysis.  She has no nausea, vomiting, diarrhea or constipation.  She has no headache or visual changes.  She denied having any recent weight loss or night sweats.  She has been tolerating her treatment with Alimta and Avastin fairly well.  The patient had repeat CT scan of the chest, abdomen pelvis performed recently and she is here for evaluation and discussion of her scan results. ? ?MEDICAL HISTORY: ?Past Medical History:  ?Diagnosis Date  ? Adenocarcinoma, lung, right ([place]) 05/03/2021  ? Kirsten [facility] is a 51 y.o. female with a history of lung nodules dating back to 2019 who is referred in consultation with Cyndi Bender, PA-C for assessment and management. She is a former smoker having quit in 2016. To date, nodules  have been stable as well as likely adrenal adenomas. She missed her screening CT last year and imaging was ordered last month. CT chest from 02-16-2021 reveals pro  ? Anxiety   ? GERD (gastroesophageal reflux disease)   ? SLE (systemic lupus erythematosus) ([place]) 05/03/2021  ? SLE (systemic lupus erythematosus) ([place]) 05/03/2021  ? ? ?ALLERGIES:  is allergic to carboplatin. ? ?MEDICATIONS:  ?Current Outpatient Medications  ?Medication Sig Dispense Refill  ? acetaminophen (TYLENOL) 325 MG tablet Take 975 mg by mouth every 6 (six) hours as needed for moderate pain or headache.    ? B Complex Vitamins (B COMPLEX 50 PO) Take by mouth.    ? Calcium Carbonate-Vit D-Min (CALCIUM 1200 PO) Take 1 capsule by mouth daily.    ? cholecalciferol (VITAMIN D3) 25 MCG (1000 UNIT) tablet Take 2,000 Units by mouth daily.    ? cyclobenzaprine (FLEXERIL) 10 MG tablet Take 10 mg by mouth at bedtime as needed for muscle spasms.    ? dexamethasone (DECADRON) 4 MG tablet 4 mg p.o. twice daily the day before, day of and day after the chemotherapy every 3 weeks. 40 tablet 1  ? diphenhydrAMINE (BENADRYL) 25 MG tablet Take 25 mg by mouth daily as needed for allergies.    ? folic acid (FOLVITE) 1 MG tablet TAKE 1 TABLET(1 MG) BY MOUTH DAILY 30 tablet 4  ? hydrocortisone 1 % lotion Apply 1 application topically 2 (two) times daily. 118 mL 0  ? hydroxychloroquine (PLAQUENIL) 200 MG tablet Take 200 mg by mouth 2 (two) times daily.    ?  Multiple Vitamins-Minerals (MULTI ADULT GUMMIES) CHEW Chew 2 tablets by mouth daily.    ? omeprazole (PRILOSEC) 40 MG capsule Take 40 mg by mouth daily as needed (acid reflux).    ? Probiotic Product (PROBIOTIC-10 PO) Take by mouth.    ? rizatriptan (MAXALT-MLT) 10 MG disintegrating tablet Take 10 mg by mouth 2 (two) times daily as needed.    ? tetrahydrozoline 0.05 % ophthalmic solution Place 1 drop into both eyes once. Systane Eye Drops once daily both eyes    ? triamcinolone ointment (KENALOG) 0.5 % Apply topically 3  (three) times daily.    ? dicyclomine (BENTYL) 10 MG capsule Take 10 mg by mouth every 6 (six) hours as needed. (Patient not taking: Reported on 02/28/2022)    ? oxyCODONE-acetaminophen (PERCOCET/ROXICET) 5-325 MG tablet Take 1 tablet by mouth every 8 (eight) hours as needed for severe pain. (Patient not taking: Reported on 02/28/2022) 30 tablet 0  ? prochlorperazine (COMPAZINE) 10 MG tablet Take 1 tablet (10 mg total) by mouth every 6 (six) hours as needed. (Patient not taking: Reported on 02/28/2022) 30 tablet 2  ? ?No current facility-administered medications for this visit.  ? ? ?SURGICAL HISTORY:  ?Past Surgical History:  ?Procedure Laterality Date  ? ABDOMINAL HYSTERECTOMY    ? BRONCHIAL BIOPSY  04/24/2021  ? Procedure: BRONCHIAL BIOPSIES;  Surgeon: Collene Gobble, MD;  Location[facility] ENDOSCOPY;  Service: Pulmonary;;  ? BRONCHIAL BRUSHINGS  04/24/2021  ? Procedure: BRONCHIAL BRUSHINGS;  Surgeon: Collene Gobble, MD;  Location: [facility] ENDOSCOPY;  Service: Pulmonary;;  ? BRONCHIAL NEEDLE ASPIRATION BIOPSY  04/24/2021  ? Procedure: BRONCHIAL NEEDLE ASPIRATION BIOPSIES;  Surgeon: Collene Gobble, MD;  Location: [facility] ENDOSCOPY;  Service: Pulmonary;;  ? BRONCHIAL WASHINGS  04/24/2021  ? Procedure: BRONCHIAL WASHINGS;  Surgeon: Collene Gobble, MD;  Location: [facility] ENDOSCOPY;  Service: Pulmonary;;  ? CESAREAN SECTION  11/09/1990  ? DILATION AND CURETTAGE OF UTERUS Bilateral 09/09/1996  ? FIDUCIAL MARKER PLACEMENT  04/24/2021  ? Procedure: FIDUCIAL MARKER PLACEMENT;  Surgeon: Collene Gobble, MD;  Location: [facility] ENDOSCOPY;  Service: Pulmonary;;  ? TONSILLECTOMY  12/10/1977  ? VIDEO BRONCHOSCOPY WITH ENDOBRONCHIAL NAVIGATION Bilateral 04/24/2021  ? Procedure: VIDEO BRONCHOSCOPY WITH ENDOBRONCHIAL NAVIGATION;  Surgeon: Collene Gobble, MD;  Location: [facility] ENDOSCOPY;  Service: Pulmonary;  Laterality: Bilateral;  ? ? ?REVIEW OF SYSTEMS:  Constitutional: positive for fatigue ?Eyes: negative ?Ears, nose, mouth, throat, and face:  negative ?Respiratory: negative ?Cardiovascular: negative ?Gastrointestinal: negative ?Genitourinary:negative ?Integument/breast: negative ?Hematologic/lymphatic: negative ?Musculoskeletal:negative ?Neurological: negative ?Behavioral/Psych: negative ?Endocrine: negative ?Allergic/Immunologic: negative  ? ?PHYSICAL EXAMINATION: General appearance: alert, cooperative, fatigued, and no distress ?Head: Normocephalic, without obvious abnormality, atraumatic ?Neck: no adenopathy, no JVD, supple, symmetrical, trachea midline, and thyroid not enlarged, symmetric, no tenderness/mass/nodules ?Lymph nodes: Cervical, supraclavicular, and axillary nodes normal. ?Resp: clear to auscultation bilaterally ?Back: symmetric, no curvature. ROM normal. No CVA tenderness. ?Cardio: regular rate and rhythm, S1, S2 normal, no murmur, click, rub or gallop ?GI: soft, non-tender; bowel sounds normal; no masses,  no organomegaly ?Extremities: extremities normal, atraumatic, no cyanosis or edema ?Neurologic: Alert and oriented X 3, normal strength and tone. Normal symmetric reflexes. Normal coordination and gait ? ?ECOG PERFORMANCE STATUS: 1 - Symptomatic but completely ambulatory ? ?Blood pressure 137/82, pulse 93, temperature (!) 97.2 ?F (36.2 ?C), temperature source Tympanic, resp. rate 17, weight 119 lb 4 oz (54.1 kg), SpO2 100 %. ? ?LABORATORY DATA: ?Lab Results  ?Component Value Date  ? WBC 13.3 (H) 02/28/2022  ? HGB  13.7 02/28/2022  ? HCT 41.0 02/28/2022  ? MCV 96.7 02/28/2022  ? PLT 277 02/28/2022  ? ? ?  Chemistry   ?   ?Component Value Date/Time  ? NA 137 02/07/2022 1028  ? NA 140 03/13/2021 0000  ? K 3.5 02/07/2022 1028  ? CL 101 02/07/2022 1028  ? CO2 23 02/07/2022 1028  ? BUN 13 02/07/2022 1028  ? BUN 10 03/13/2021 0000  ? CREATININE 0.79 02/07/2022 1028  ? GLU 107 03/13/2021 0000  ?    ?Component Value Date/Time  ? CALCIUM 10.1 02/07/2022 1028  ? ALKPHOS 107 02/07/2022 1028  ? AST 29 02/07/2022 1028  ? ALT 58 (H) 02/07/2022 1028  ?  BILITOT 0.3 02/07/2022 1028  ?  ? ? ? ?RADIOGRAPHIC STUDIES: ?CT Chest W Contrast ? ?Result Date: 02/23/2022 ?CLINICAL DATA:  * Tracking Code: BO * Primary Cancer Type: Lung Imaging Indication: Assess response to thera

## 2022-03-01 ENCOUNTER — Other Ambulatory Visit: Payer: Self-pay

## 2022-03-01 DIAGNOSIS — M810 Age-related osteoporosis without current pathological fracture: Secondary | ICD-10-CM

## 2022-03-01 HISTORY — DX: Age-related osteoporosis without current pathological fracture: M81.0

## 2022-03-02 ENCOUNTER — Telehealth: Payer: Self-pay | Admitting: Pharmacy Technician

## 2022-03-02 NOTE — Telephone Encounter (Signed)
Auth Submission: pending ?Payer: urm ?Medication & CPT/J Code(s) submitted: Reclast (Zolendronic acid) J3489 ?Route of submission (phone, fax, portal): portal ?Auth type: Buy/Bill ?Units/visits requested: 1 visit ?Reference number: trrans id# [id number] ?Aerial id [phone] ? ? ?Will update once we receive a response. ? ?  ?

## 2022-03-15 NOTE — Telephone Encounter (Signed)
Auth Submission: Denied - PENDING RECONSIDERATION REQUEST ?requesting additional info (dexa scan/labs) ?Additional info was faxed on 03/12/22:  ([phone]) and additional info has also been uploaded to the patients chart via portal. ?Awaiting response ?Ref# [id number]

## 2022-03-17 NOTE — Progress Notes (Signed)
[facility] ?OFFICE PROGRESS NOTE ? ?Cyndi Bender, PA-C ?[address] ?[place] [address] ? ?DIAGNOSIS: Stage IV (T4, N2, M1 a) non-small cell lung cancer, adenocarcinoma presented with multifocal disease involving the right upper lobe, right lower lobe as well as suspicious lower paratracheal lymphadenopathy and groundglass nodules in the left lung diagnosed in May 2022. ?The patient had molecular studies performed by [facility] and that showed no detectable mutation but this is likely secondary to low-level circulating free tumor DNA. ? ?PRIOR THERAPY: None ? ?CURRENT THERAPY: Palliative systemic chemotherapy with carboplatin for AUC of 5, Alimta 500 Mg/M2 and Avastin 15 Mg/KG every 3 weeks.  The patient is not a great candidate for immunotherapy in the event that she has a genetic mutation.  She is status post 13 cycles. Starting from cycle #6, the patient will start maintenance Alimta and Avastin. ? ?INTERVAL HISTORY: ?MARQUE BANGO 51 y.o. female returns to the clinic today for a follow-up visit. She is feeling well today without any concerning complaints. Since I saw her last, she saw an orthopedic provider for her chronic right foot pain and swelling due to an old injury. They are going to due a nerve conduction study in September 5th, 2023. She also had a few days of having "knots" in her stomach with associated nausea after her last infusion. She reached out to her GI provider and they are going to see her on the 19th to set up an endoscopy per patient report. She denies any vomiting in the interval since last being seen. The patient wanted to clarify that a diagnosis on her chart is incorrect. The patient does not have SLE. She has cutaneous lupus. I will remove the diagnosis code of SLE from her chart. ? ?She is tolerating her treatment well without any concerning adverse side effects except she has some fatigue though week following treatment.  She denies any fever or chills.   She has occasional night sweats which is not new for her about 2-3 days after her treatment. Her appetite is "up and down", she gained a few pounds since her last appointment. She denies any significant shortness of breath or hemoptysis. She denies any chest pain. She has an occasional mild cough which produces clear sputum.  She sometimes has a sensation of tightness around her diaphragm the first week following treatment. She generally has chronic diarrhea which is normal for her. She denies any abnormal bleeding or bruising.  She is prone to headaches and has a history of migraines, but denies any significant headaches in the interval. She is here today for a follow up visit before starting cycle #14.  ? ?MEDICAL HISTORY: ?Past Medical History:  ?Diagnosis Date  ? Adenocarcinoma, lung, right ([place]) 05/03/2021  ? EVETTE DICLEMENTE is a 51 y.o. female with a history of lung nodules dating back to 2019 who is referred in consultation with Cyndi Bender, PA-C for assessment and management. She is a former smoker having quit in 2016. 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 She missed her screening CT last year and imaging was ordered last month. CT chest from 02-16-2021 reveals pro  ? Anxiety   ? GERD (gastroesophageal reflux disease)   ? SLE (systemic lupus erythematosus) ([place]) 05/03/2021  ? SLE (systemic lupus erythematosus) ([place]) 05/03/2021  ? ? ?ALLERGIES:  is allergic to carboplatin. ? ?MEDICATIONS:  ?Current Outpatient Medications  ?Medication Sig Dispense Refill  ? acetaminophen (TYLENOL) 325 MG tablet  Take 975 mg by mouth every 6 (six) hours as needed for moderate pain or headache.    ? B Complex Vitamins (B COMPLEX 50 PO) Take by mouth.    ? Calcium Carbonate-Vit D-Min (CALCIUM 1200 PO) Take 1 capsule by mouth daily.    ? cholecalciferol (VITAMIN D3) 25 MCG (1000 UNIT) tablet Take 2,000 Units by mouth daily.    ? cyclobenzaprine (FLEXERIL) 10 MG tablet Take 10 mg by mouth at bedtime  as needed for muscle spasms.    ? dexamethasone (DECADRON) 4 MG tablet 4 mg p.o. twice daily the day before, day of and day after the chemotherapy every 3 weeks. 40 tablet 2  ? dicyclomine (BENTYL) 10 MG capsule Take 10 mg by mouth every 6 (six) hours as needed. (Patient not taking: Reported on 02/28/2022)    ? diphenhydrAMINE (BENADRYL) 25 MG tablet Take 25 mg by mouth daily as needed for allergies.    ? folic acid (FOLVITE) 1 MG tablet TAKE 1 TABLET(1 MG) BY MOUTH DAILY 30 tablet 4  ? hydrocortisone 1 % lotion Apply 1 application topically 2 (two) times daily. 118 mL 0  ? hydroxychloroquine (PLAQUENIL) 200 MG tablet Take 200 mg by mouth 2 (two) times daily.    ? Multiple Vitamins-Minerals (MULTI ADULT GUMMIES) CHEW Chew 2 tablets by mouth daily.    ? omeprazole (PRILOSEC) 40 MG capsule Take 40 mg by mouth daily as needed (acid reflux).    ? oxyCODONE-acetaminophen (PERCOCET/ROXICET) 5-325 MG tablet Take 1 tablet by mouth every 8 (eight) hours as needed for severe pain. (Patient not taking: Reported on 02/28/2022) 30 tablet 0  ? Probiotic Product (PROBIOTIC-10 PO) Take by mouth.    ? prochlorperazine (COMPAZINE) 10 MG tablet Take 1 tablet (10 mg total) by mouth every 6 (six) hours as needed. (Patient not taking: Reported on 02/28/2022) 30 tablet 2  ? rizatriptan (MAXALT-MLT) 10 MG disintegrating tablet Take 10 mg by mouth 2 (two) times daily as needed.    ? tetrahydrozoline 0.05 % ophthalmic solution Place 1 drop into both eyes once. Systane Eye Drops once daily both eyes    ? triamcinolone ointment (KENALOG) 0.5 % Apply topically 3 (three) times daily.    ? ?No current facility-administered medications for this visit.  ? ? ?SURGICAL HISTORY:  ?Past Surgical History:  ?Procedure Laterality Date  ? ABDOMINAL HYSTERECTOMY    ? BRONCHIAL BIOPSY  04/24/2021  ? Procedure: BRONCHIAL BIOPSIES;  Surgeon: Collene Gobble, MD;  Location: [facility] ENDOSCOPY;  Service: Pulmonary;;  ? BRONCHIAL BRUSHINGS  04/24/2021  ? Procedure:  BRONCHIAL BRUSHINGS;  Surgeon: Collene Gobble, MD;  Location: [facility] ENDOSCOPY;  Service: Pulmonary;;  ? BRONCHIAL NEEDLE ASPIRATION BIOPSY  04/24/2021  ? Procedure: BRONCHIAL NEEDLE ASPIRATION BIOPSIES;  Surgeon: Collene Gobble, MD;  Location: [facility] ENDOSCOPY;  Service: Pulmonary;;  ? BRONCHIAL WASHINGS  04/24/2021  ? Procedure: BRONCHIAL WASHINGS;  Surgeon: Collene Gobble, MD;  Location: [facility] ENDOSCOPY;  Service: Pulmonary;;  ? CESAREAN SECTION  11/09/1990  ? DILATION AND CURETTAGE OF UTERUS Bilateral 09/09/1996  ? FIDUCIAL MARKER PLACEMENT  04/24/2021  ? Procedure: FIDUCIAL MARKER PLACEMENT;  Surgeon: Collene Gobble, MD;  Location[facility] ENDOSCOPY;  Service: Pulmonary;;  ? TONSILLECTOMY  12/10/1977  ? VIDEO BRONCHOSCOPY WITH ENDOBRONCHIAL NAVIGATION Bilateral 04/24/2021  ? Procedure: VIDEO BRONCHOSCOPY WITH ENDOBRONCHIAL NAVIGATION;  Surgeon: Collene Gobble, MD;  Location: [facility] ENDOSCOPY;  Service: Pulmonary;  Laterality: Bilateral;  ? ? ?REVIEW OF SYSTEMS:   ?Constitutional: Positive for fatigue 1  week following treatment. Negative for appetite change, chills, fever and unexpected weight change.  ?HENT: Negative for mouth sores, nosebleeds, sore throat and trouble swallowing.   ?Eyes: Negative for eye problems and icterus.  ?Respiratory: Positive for mild cough. Negative for hemoptysis, shortness of breath and wheezing.   ?Cardiovascular: Negative for chest pain and leg swelling.  ?Gastrointestinal: Positive for occasional nausea. Positive for chronic diarrhea. Had some epigastric discomfort for a few days following her last infusion.  ?Genitourinary: Negative for bladder incontinence, difficulty urinating, dysuria, frequency and hematuria.   ?Musculoskeletal:  Positive for occasional sharp shooting pain down her foot. Negative for back pain, gait problem, neck pain and neck stiffness.  ?Skin: Negative for rash or itching.  ?Neurological: Negative for dizziness, extremity weakness, gait problem, headaches, light-headedness  and seizures.  ?Hematological: Negative for adenopathy. Does not bruise/bleed easily.  ?Psychiatric/Behavioral: Negative for confusion, depression and sleep disturbance. The patient is not nervous/anxious.  ? ?

## 2022-03-20 ENCOUNTER — Encounter: Payer: Self-pay | Admitting: Neurology

## 2022-03-20 ENCOUNTER — Other Ambulatory Visit: Payer: Self-pay

## 2022-03-20 DIAGNOSIS — R202 Paresthesia of skin: Secondary | ICD-10-CM

## 2022-03-21 ENCOUNTER — Inpatient Hospital Stay: Payer: Commercial Managed Care - PPO | Attending: Hematology and Oncology

## 2022-03-21 ENCOUNTER — Inpatient Hospital Stay (HOSPITAL_BASED_OUTPATIENT_CLINIC_OR_DEPARTMENT_OTHER): Payer: Commercial Managed Care - PPO | Admitting: Physician Assistant

## 2022-03-21 ENCOUNTER — Other Ambulatory Visit: Payer: Self-pay

## 2022-03-21 ENCOUNTER — Inpatient Hospital Stay: Payer: Commercial Managed Care - PPO

## 2022-03-21 VITALS — BP 140/90 | HR 96 | Temp 97.8°F | Resp 19 | Ht 61.0 in | Wt 120.5 lb

## 2022-03-21 DIAGNOSIS — Z5112 Encounter for antineoplastic immunotherapy: Secondary | ICD-10-CM | POA: Diagnosis present

## 2022-03-21 DIAGNOSIS — Z5111 Encounter for antineoplastic chemotherapy: Secondary | ICD-10-CM | POA: Insufficient documentation

## 2022-03-21 DIAGNOSIS — C3411 Malignant neoplasm of upper lobe, right bronchus or lung: Secondary | ICD-10-CM | POA: Insufficient documentation

## 2022-03-21 DIAGNOSIS — C3491 Malignant neoplasm of unspecified part of right bronchus or lung: Secondary | ICD-10-CM | POA: Diagnosis not present

## 2022-03-21 DIAGNOSIS — L932 Other local lupus erythematosus: Secondary | ICD-10-CM | POA: Diagnosis not present

## 2022-03-21 HISTORY — DX: Other local lupus erythematosus: L93.2

## 2022-03-21 LAB — CBC WITH DIFFERENTIAL (CANCER CENTER ONLY)
Abs Immature Granulocytes: 0.03 10*3/uL (ref 0.00–0.07)
Basophils Absolute: 0 10*3/uL (ref 0.0–0.1)
Basophils Relative: 0 %
Eosinophils Absolute: 0 10*3/uL (ref 0.0–0.5)
Eosinophils Relative: 0 %
HCT: 41.1 % (ref 36.0–46.0)
Hemoglobin: 13.7 g/dL (ref 12.0–15.0)
Immature Granulocytes: 0 %
Lymphocytes Relative: 12 %
Lymphs Abs: 1.5 10*3/uL (ref 0.7–4.0)
MCH: 32.2 pg (ref 26.0–34.0)
MCHC: 33.3 g/dL (ref 30.0–36.0)
MCV: 96.7 fL (ref 80.0–100.0)
Monocytes Absolute: 1.3 10*3/uL — ABNORMAL HIGH (ref 0.1–1.0)
Monocytes Relative: 10 %
Neutro Abs: 9.7 10*3/uL — ABNORMAL HIGH (ref 1.7–7.7)
Neutrophils Relative %: 78 %
Platelet Count: 294 10*3/uL (ref 150–400)
RBC: 4.25 MIL/uL (ref 3.87–5.11)
RDW: 14.6 % (ref 11.5–15.5)
WBC Count: 12.6 10*3/uL — ABNORMAL HIGH (ref 4.0–10.5)
nRBC: 0 % (ref 0.0–0.2)

## 2022-03-21 LAB — CMP (CANCER CENTER ONLY)
ALT: 48 U/L — ABNORMAL HIGH (ref 0–44)
AST: 26 U/L (ref 15–41)
Albumin: 4.3 g/dL (ref 3.5–5.0)
Alkaline Phosphatase: 102 U/L (ref 38–126)
Anion gap: 10 (ref 5–15)
BUN: 13 mg/dL (ref 6–20)
CO2: 23 mmol/L (ref 22–32)
Calcium: 10 mg/dL (ref 8.9–10.3)
Chloride: 104 mmol/L (ref 98–111)
Creatinine: 0.75 mg/dL (ref 0.44–1.00)
GFR, Estimated: >60 mL/min (ref >60)
Glucose, Bld: 154 mg/dL — ABNORMAL HIGH (ref 70–99)
Potassium: 3.9 mmol/L (ref 3.5–5.1)
Sodium: 137 mmol/L (ref 135–145)
Total Bilirubin: 0.2 mg/dL — ABNORMAL LOW (ref 0.3–1.2)
Total Protein: 7.8 g/dL (ref 6.5–8.1)

## 2022-03-21 LAB — TOTAL PROTEIN, URINE DIPSTICK: Protein, ur: 30 mg/dL — AB

## 2022-03-21 MED ORDER — SODIUM CHLORIDE 0.9 % IV SOLN: Freq: Once | INTRAVENOUS | Status: AC

## 2022-03-21 MED ORDER — SODIUM CHLORIDE 0.9 % IV SOLN
500.0000 mg/m2 | Freq: Once | INTRAVENOUS | Status: AC
  Administered 2022-03-21: 800 mg via INTRAVENOUS
  Filled 2022-03-21: qty 20

## 2022-03-21 MED ORDER — SODIUM CHLORIDE 0.9 % IV SOLN
15.0000 mg/kg | Freq: Once | INTRAVENOUS | Status: AC
  Administered 2022-03-21: 800 mg via INTRAVENOUS
  Filled 2022-03-21: qty 32

## 2022-03-21 MED ORDER — PROCHLORPERAZINE MALEATE 10 MG PO TABS
10.0000 mg | ORAL_TABLET | Freq: Once | ORAL | Status: AC
  Administered 2022-03-21: 10 mg via ORAL
  Filled 2022-03-21: qty 1

## 2022-03-21 MED ORDER — CYANOCOBALAMIN 1000 MCG/ML IJ SOLN
1000.0000 ug | Freq: Once | INTRAMUSCULAR | Status: AC
  Administered 2022-03-21: 1000 ug via INTRAMUSCULAR
  Filled 2022-03-21: qty 1

## 2022-03-21 MED ORDER — DEXAMETHASONE 4 MG PO TABS: ORAL_TABLET | ORAL | 2 refills | Status: DC

## 2022-03-21 NOTE — Patient Instructions (Signed)
[facility]  Discharge Instructions: ?Thank you for choosing [facility] to provide your oncology and hematology care.  ? ?If you have a lab appointment with the [facility], please go directly to the [facility] and check in at the registration area. ?  ?Wear comfortable clothing and clothing appropriate for easy access to any Portacath or PICC line.  ? ?We strive to give you quality time with your provider. You may need to reschedule your appointment if you arrive late (15 or more minutes).  Arriving late affects you and other patients whose appointments are after yours.  Also, if you miss three or more appointments without notifying the office, you may be dismissed from the clinic at the provider?s discretion.    ?  ?For prescription refill requests, have your pharmacy contact our office and allow 72 hours for refills to be completed.   ? ?Today you received the following chemotherapy and/or immunotherapy agents: Bevacizumab, Alimta.     ?  ?To help prevent nausea and vomiting after your treatment, we encourage you to take your nausea medication as directed. ? ?BELOW ARE SYMPTOMS THAT SHOULD BE REPORTED IMMEDIATELY: ?*FEVER GREATER THAN 100.4 F (38 ?C) OR HIGHER ?*CHILLS OR SWEATING ?*NAUSEA AND VOMITING THAT IS NOT CONTROLLED WITH YOUR NAUSEA MEDICATION ?*UNUSUAL SHORTNESS OF BREATH ?*UNUSUAL BRUISING OR BLEEDING ?*URINARY PROBLEMS (pain or burning when urinating, or frequent urination) ?*BOWEL PROBLEMS (unusual diarrhea, constipation, pain near the anus) ?TENDERNESS IN MOUTH AND THROAT WITH OR WITHOUT PRESENCE OF ULCERS (sore throat, sores in mouth, or a toothache) ?UNUSUAL RASH, SWELLING OR PAIN  ?UNUSUAL VAGINAL DISCHARGE OR ITCHING  ? ?Items with * indicate a potential emergency and should be followed up as soon as possible or go to the Emergency Department if any problems should occur. ? ?Please show the CHEMOTHERAPY ALERT CARD or IMMUNOTHERAPY ALERT CARD at  check-in to the Emergency Department and triage nurse. ? ?Should you have questions after your visit or need to cancel or reschedule your appointment, please contact [facility]  Dept: [phone]  and follow the prompts.  Office hours are 8:00 a.m. to 4:30 p.m. Monday - Friday. Please note that voicemails left after 4:00 p.m. may not be returned until the following business day.  We are closed weekends and major holidays. You have access to a nurse at all times for urgent questions. Please call the main number to the clinic Dept: [phone] and follow the prompts. ? ? ?For any non-urgent questions, you may also contact your provider using MyChart. We now offer e-Visits for anyone 23 and older to request care online for non-urgent symptoms. For details visit [email or web address]. ?  ?Also download the MyChart app! Go to the app store, search "MyChart", open the app, select [facility], and log in with your MyChart username and password. ? ?Due to Covid, a mask is required upon entering the hospital/clinic. If you do not have a mask, one will be given to you upon arrival. For doctor visits, patients may have 1 support person aged 68 or older with them. For treatment visits, patients cannot have anyone with them due to current Covid guidelines and our immunocompromised population.  ? ?

## 2022-03-21 NOTE — Telephone Encounter (Signed)
Auth Submission: denied ?Payer: [employer]MR ?Medication & CPT/J Code(s) submitted: Reclast (Zolendronic acid) J3489 ?Route of submission (phone, fax, portal): PORTAL ?Auth type: Buy/Bill ?Units/visits requested: X1 DOSE ?Reference number: [id number] ?Denied due to no documentation of oral exam. ? ?Left v/m with [facility] [employer] ?Phone: [phone] ?Brandy/RN ?MD: Teresa Coombs ? ? ?

## 2022-03-23 ENCOUNTER — Other Ambulatory Visit: Payer: Self-pay | Admitting: Pharmacy Technician

## 2022-03-23 NOTE — Telephone Encounter (Signed)
F/u: Reclast. ?Spoke with Haily/receptionist from [employer] patient has decided not to receive Reclast. ?Referral will be cancelled ?Phone: [phone]

## 2022-04-02 ENCOUNTER — Encounter: Payer: Self-pay | Admitting: Internal Medicine

## 2022-04-09 ENCOUNTER — Other Ambulatory Visit: Payer: Self-pay

## 2022-04-09 MED ORDER — FOLIC ACID 1 MG PO TABS: ORAL_TABLET | ORAL | 4 refills | Status: DC

## 2022-04-11 ENCOUNTER — Other Ambulatory Visit: Payer: Self-pay

## 2022-04-11 ENCOUNTER — Encounter: Payer: Self-pay | Admitting: Internal Medicine

## 2022-04-11 ENCOUNTER — Inpatient Hospital Stay: Payer: Commercial Managed Care - PPO

## 2022-04-11 ENCOUNTER — Inpatient Hospital Stay: Payer: Commercial Managed Care - PPO | Admitting: Internal Medicine

## 2022-04-11 ENCOUNTER — Inpatient Hospital Stay: Payer: Commercial Managed Care - PPO | Attending: Hematology and Oncology

## 2022-04-11 VITALS — BP 145/81 | HR 97 | Temp 97.6°F | Resp 18 | Wt 122.5 lb

## 2022-04-11 DIAGNOSIS — C3411 Malignant neoplasm of upper lobe, right bronchus or lung: Secondary | ICD-10-CM | POA: Insufficient documentation

## 2022-04-11 DIAGNOSIS — Z5112 Encounter for antineoplastic immunotherapy: Secondary | ICD-10-CM | POA: Insufficient documentation

## 2022-04-11 DIAGNOSIS — C3491 Malignant neoplasm of unspecified part of right bronchus or lung: Secondary | ICD-10-CM

## 2022-04-11 DIAGNOSIS — M329 Systemic lupus erythematosus, unspecified: Secondary | ICD-10-CM | POA: Diagnosis not present

## 2022-04-11 DIAGNOSIS — Z5111 Encounter for antineoplastic chemotherapy: Secondary | ICD-10-CM | POA: Insufficient documentation

## 2022-04-11 LAB — CMP (CANCER CENTER ONLY)
ALT: 55 U/L — ABNORMAL HIGH (ref 0–44)
AST: 38 U/L (ref 15–41)
Albumin: 4.1 g/dL (ref 3.5–5.0)
Alkaline Phosphatase: 105 U/L (ref 38–126)
Anion gap: 11 (ref 5–15)
BUN: 17 mg/dL (ref 6–20)
CO2: 22 mmol/L (ref 22–32)
Calcium: 10.2 mg/dL (ref 8.9–10.3)
Chloride: 106 mmol/L (ref 98–111)
Creatinine: 0.71 mg/dL (ref 0.44–1.00)
GFR, Estimated: >60 mL/min (ref >60)
Glucose, Bld: 154 mg/dL — ABNORMAL HIGH (ref 70–99)
Potassium: 3.8 mmol/L (ref 3.5–5.1)
Sodium: 139 mmol/L (ref 135–145)
Total Bilirubin: 0.5 mg/dL (ref 0.3–1.2)
Total Protein: 8 g/dL (ref 6.5–8.1)

## 2022-04-11 LAB — CBC WITH DIFFERENTIAL (CANCER CENTER ONLY)
Abs Immature Granulocytes: 0.03 10*3/uL (ref 0.00–0.07)
Basophils Absolute: 0 10*3/uL (ref 0.0–0.1)
Basophils Relative: 0 %
Eosinophils Absolute: 0 10*3/uL (ref 0.0–0.5)
Eosinophils Relative: 0 %
HCT: 40.4 % (ref 36.0–46.0)
Hemoglobin: 13.4 g/dL (ref 12.0–15.0)
Immature Granulocytes: 0 %
Lymphocytes Relative: 8 %
Lymphs Abs: 0.9 10*3/uL (ref 0.7–4.0)
MCH: 32.2 pg (ref 26.0–34.0)
MCHC: 33.2 g/dL (ref 30.0–36.0)
MCV: 97.1 fL (ref 80.0–100.0)
Monocytes Absolute: 0.9 10*3/uL (ref 0.1–1.0)
Monocytes Relative: 8 %
Neutro Abs: 10.3 10*3/uL — ABNORMAL HIGH (ref 1.7–7.7)
Neutrophils Relative %: 84 %
Platelet Count: 292 10*3/uL (ref 150–400)
RBC: 4.16 MIL/uL (ref 3.87–5.11)
RDW: 15.3 % (ref 11.5–15.5)
WBC Count: 12.2 10*3/uL — ABNORMAL HIGH (ref 4.0–10.5)
nRBC: 0 % (ref 0.0–0.2)

## 2022-04-11 LAB — TOTAL PROTEIN, URINE DIPSTICK: Protein, ur: 30 mg/dL — AB

## 2022-04-11 MED ORDER — PROCHLORPERAZINE MALEATE 10 MG PO TABS
10.0000 mg | ORAL_TABLET | Freq: Once | ORAL | Status: AC
  Administered 2022-04-11: 10 mg via ORAL
  Filled 2022-04-11: qty 1

## 2022-04-11 MED ORDER — SODIUM CHLORIDE 0.9 % IV SOLN: Freq: Once | INTRAVENOUS | Status: AC

## 2022-04-11 MED ORDER — SODIUM CHLORIDE 0.9 % IV SOLN
15.0000 mg/kg | Freq: Once | INTRAVENOUS | Status: AC
  Administered 2022-04-11: 800 mg via INTRAVENOUS
  Filled 2022-04-11: qty 32

## 2022-04-11 MED ORDER — SODIUM CHLORIDE 0.9 % IV SOLN
500.0000 mg/m2 | Freq: Once | INTRAVENOUS | Status: AC
  Administered 2022-04-11: 800 mg via INTRAVENOUS
  Filled 2022-04-11: qty 20

## 2022-04-11 NOTE — Patient Instructions (Signed)
[facility]  Discharge Instructions: ?Thank you for choosing [facility] to provide your oncology and hematology care.  ? ?If you have a lab appointment with the [facility], please go directly to the [facility] and check in at the registration area. ?  ?Wear comfortable clothing and clothing appropriate for easy access to any Portacath or PICC line.  ? ?We strive to give you quality time with your provider. You may need to reschedule your appointment if you arrive late (15 or more minutes).  Arriving late affects you and other patients whose appointments are after yours.  Also, if you miss three or more appointments without notifying the office, you may be dismissed from the clinic at the provider?s discretion.    ?  ?For prescription refill requests, have your pharmacy contact our office and allow 72 hours for refills to be completed.   ? ?Today you received the following chemotherapy and/or immunotherapy agents: MVASI/Alimta    ?  ?To help prevent nausea and vomiting after your treatment, we encourage you to take your nausea medication as directed. ? ?BELOW ARE SYMPTOMS THAT SHOULD BE REPORTED IMMEDIATELY: ?*FEVER GREATER THAN 100.4 F (38 ?C) OR HIGHER ?*CHILLS OR SWEATING ?*NAUSEA AND VOMITING THAT IS NOT CONTROLLED WITH YOUR NAUSEA MEDICATION ?*UNUSUAL SHORTNESS OF BREATH ?*UNUSUAL BRUISING OR BLEEDING ?*URINARY PROBLEMS (pain or burning when urinating, or frequent urination) ?*BOWEL PROBLEMS (unusual diarrhea, constipation, pain near the anus) ?TENDERNESS IN MOUTH AND THROAT WITH OR WITHOUT PRESENCE OF ULCERS (sore throat, sores in mouth, or a toothache) ?UNUSUAL RASH, SWELLING OR PAIN  ?UNUSUAL VAGINAL DISCHARGE OR ITCHING  ? ?Items with * indicate a potential emergency and should be followed up as soon as possible or go to the Emergency Department if any problems should occur. ? ?Please show the CHEMOTHERAPY ALERT CARD or IMMUNOTHERAPY ALERT CARD at check-in  to the Emergency Department and triage nurse. ? ?Should you have questions after your visit or need to cancel or reschedule your appointment, please contact [facility]  Dept: [phone]  and follow the prompts.  Office hours are 8:00 a.m. to 4:30 p.m. Monday - Friday. Please note that voicemails left after 4:00 p.m. may not be returned until the following business day.  We are closed weekends and major holidays. You have access to a nurse at all times for urgent questions. Please call the main number to the clinic Dept: [phone] and follow the prompts. ? ? ?For any non-urgent questions, you may also contact your provider using MyChart. We now offer e-Visits for anyone 64 and older to request care online for non-urgent symptoms. For details visit [email or web address]. ?  ?Also download the MyChart app! Go to the app store, search "MyChart", open the app, select , and log in with your MyChart username and password. ? ?Due to Covid, a mask is required upon entering the hospital/clinic. If you do not have a mask, one will be given to you upon arrival. For doctor visits, patients may have 1 support person aged 61 or older with them. For treatment visits, patients cannot have anyone with them due to current Covid guidelines and our immunocompromised population.  ? ?

## 2022-04-11 NOTE — Progress Notes (Signed)
?    [facility] ?[phone]   [phone] ? ?OFFICE PROGRESS NOTE ? ?Cyndi Bender, PA-C ?[address] ?[place] [address] ? ?DIAGNOSIS:  Stage IV (T4, N2, M1 a) non-small cell lung cancer, adenocarcinoma presented with multifocal disease involving the right upper lobe, right lower lobe as well as suspicious lower paratracheal lymphadenopathy and groundglass nodules in the left lung diagnosed in May 2022. ?The patient had molecular studies performed by [facility] [employer] and that showed no detectable mutation but this is likely secondary to low-level circulating free tumor DNA. ? ?PRIOR THERAPY: None. ? ?CURRENT THERAPY: palliative systemic chemotherapy with carboplatin for AUC of 5, Alimta 500 Mg/M2 and Avastin 15 Mg/KG every 3 weeks.  The patient is not a great candidate for immunotherapy because of her history of active systemic lupus erythematosus.  She is status post 14 cycles. ? ?INTERVAL HISTORY: ?Kirsten Williams 51 y.o. female returns to the clinic today for follow-up visit.  The patient is feeling fine today with no concerning complaints.  She denied having any chest pain, shortness of breath, cough or hemoptysis.  She has no nausea, vomiting, diarrhea or constipation.  She has no headache or visual changes.  She denied having any recent weight loss or night sweats.  She has been tolerating her treatment with maintenance Alimta and Avastin fairly well.  She is here today for evaluation before starting cycle #15. ? ?MEDICAL HISTORY: ?Past Medical History:  ?Diagnosis Date  ? Adenocarcinoma, lung, right (Kirsten Williams) 05/03/2021  ? Kirsten Williams is a 51 y.o. female with a history of lung nodules dating back to 2019 who is referred in consultation with Cyndi Bender, PA-C for assessment and management. She is a former smoker having quit in 2016. 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 She missed her screening CT last year and imaging was ordered last  month. CT chest from 02-16-2021 reveals pro  ? Anxiety   ? GERD (gastroesophageal reflux disease)   ? SLE (systemic lupus erythematosus) ([facility]) 05/03/2021  ? SLE (systemic lupus erythematosus) ([facility]) 05/03/2021  ? ? ?ALLERGIES:  is allergic to carboplatin. ? ?MEDICATIONS:  ?Current Outpatient Medications  ?Medication Sig Dispense Refill  ? acetaminophen (TYLENOL) 325 MG tablet Take 975 mg by mouth every 6 (six) hours as needed for moderate pain or headache.    ? B Complex Vitamins (B COMPLEX 50 PO) Take by mouth.    ? Calcium Carbonate-Vit D-Min (CALCIUM 1200 PO) Take 1 capsule by mouth daily.    ? cholecalciferol (VITAMIN D3) 25 MCG (1000 UNIT) tablet Take 2,000 Units by mouth daily.    ? cyclobenzaprine (FLEXERIL) 10 MG tablet Take 10 mg by mouth at bedtime as needed for muscle spasms.    ? dexamethasone (DECADRON) 4 MG tablet 4 mg p.o. twice daily the day before, day of and day after the chemotherapy every 3 weeks. 40 tablet 2  ? diphenhydrAMINE (BENADRYL) 25 MG tablet Take 25 mg by mouth daily as needed for allergies.    ? famotidine (PEPCID) 20 MG tablet Take 20 mg by mouth at bedtime.    ? folic acid (FOLVITE) 1 MG tablet TAKE 1 TABLET(1 MG) BY MOUTH DAILY 30 tablet 4  ? gabapentin (NEURONTIN) 100 MG capsule Take 100 mg by mouth 3 (three) times daily.    ? hydrocortisone 1 % lotion Apply 1 application topically 2 (two) times daily. 118 mL 0  ? hydroxychloroquine (PLAQUENIL) 200 MG tablet Take  200 mg by mouth 2 (two) times daily.    ? Multiple Vitamins-Minerals (MULTI ADULT GUMMIES) CHEW Chew 2 tablets by mouth daily.    ? omeprazole (PRILOSEC) 40 MG capsule Take 40 mg by mouth daily as needed (acid reflux).    ? Probiotic Product (PROBIOTIC-10 PO) Take by mouth.    ? rizatriptan (MAXALT-MLT) 10 MG disintegrating tablet Take 10 mg by mouth 2 (two) times daily as needed.    ? tetrahydrozoline 0.05 % ophthalmic solution Place 1 drop into both eyes once. Systane Eye Drops once daily both eyes    ? triamcinolone  ointment (KENALOG) 0.5 % Apply topically 3 (three) times daily.    ? dicyclomine (BENTYL) 10 MG capsule Take 10 mg by mouth every 6 (six) hours as needed. (Patient not taking: Reported on 02/28/2022)    ? oxyCODONE-acetaminophen (PERCOCET/ROXICET) 5-325 MG tablet Take 1 tablet by mouth every 8 (eight) hours as needed for severe pain. (Patient not taking: Reported on 02/28/2022) 30 tablet 0  ? prochlorperazine (COMPAZINE) 10 MG tablet Take 1 tablet (10 mg total) by mouth every 6 (six) hours as needed. (Patient not taking: Reported on 02/28/2022) 30 tablet 2  ? ?No current facility-administered medications for this visit.  ? ? ?SURGICAL HISTORY:  ?Past Surgical History:  ?Procedure Laterality Date  ? ABDOMINAL HYSTERECTOMY    ? BRONCHIAL BIOPSY  04/24/2021  ? Procedure: BRONCHIAL BIOPSIES;  Surgeon: Collene Gobble, MD;  Location: [facility] ENDOSCOPY;  Service: Pulmonary;;  ? BRONCHIAL BRUSHINGS  04/24/2021  ? Procedure: BRONCHIAL BRUSHINGS;  Surgeon: Collene Gobble, MD;  Location: [facility] ENDOSCOPY;  Service: Pulmonary;;  ? BRONCHIAL NEEDLE ASPIRATION BIOPSY  04/24/2021  ? Procedure: BRONCHIAL NEEDLE ASPIRATION BIOPSIES;  Surgeon: Collene Gobble, MD;  Location: [facility] ENDOSCOPY;  Service: Pulmonary;;  ? BRONCHIAL WASHINGS  04/24/2021  ? Procedure: BRONCHIAL WASHINGS;  Surgeon: Collene Gobble, MD;  Location: [facility] ENDOSCOPY;  Service: Pulmonary;;  ? CESAREAN SECTION  11/09/1990  ? DILATION AND CURETTAGE OF UTERUS Bilateral 09/09/1996  ? FIDUCIAL MARKER PLACEMENT  04/24/2021  ? Procedure: FIDUCIAL MARKER PLACEMENT;  Surgeon: Collene Gobble, MD;  Location: [facility] ENDOSCOPY;  Service: Pulmonary;;  ? TONSILLECTOMY  12/10/1977  ? VIDEO BRONCHOSCOPY WITH ENDOBRONCHIAL NAVIGATION Bilateral 04/24/2021  ? Procedure: VIDEO BRONCHOSCOPY WITH ENDOBRONCHIAL NAVIGATION;  Surgeon: Collene Gobble, MD;  Location: [facility] ENDOSCOPY;  Service: Pulmonary;  Laterality: Bilateral;  ? ? ?REVIEW OF SYSTEMS:  A comprehensive review of systems was negative.  ? ?PHYSICAL  EXAMINATION: General appearance: alert, cooperative, and no distress ?Head: Normocephalic, without obvious abnormality, atraumatic ?Neck: no adenopathy, no JVD, supple, symmetrical, trachea midline, and thyroid not enlarged, symmetric, no tenderness/mass/nodules ?Lymph nodes: Cervical, supraclavicular, and axillary nodes normal. ?Resp: clear to auscultation bilaterally ?Back: symmetric, no curvature. ROM normal. No CVA tenderness. ?Cardio: regular rate and rhythm, S1, S2 normal, no murmur, click, rub or gallop ?GI: soft, non-tender; bowel sounds normal; no masses,  no organomegaly ?Extremities: extremities normal, atraumatic, no cyanosis or edema ? ?ECOG PERFORMANCE STATUS: 1 - Symptomatic but completely ambulatory ? ?Blood pressure (!) 145/81, pulse 97, temperature 97.6 ?F (36.4 ?C), temperature source Tympanic, resp. rate 18, weight 122 lb 8 oz (55.6 kg), SpO2 97 %. ? ?LABORATORY DATA: ?Lab Results  ?Component Value Date  ? WBC 12.2 (H) 04/11/2022  ? HGB 13.4 04/11/2022  ? HCT 40.4 04/11/2022  ? MCV 97.1 04/11/2022  ? PLT 292 04/11/2022  ? ? ?  Chemistry   ?   ?Component Value Date/Time  ?  NA 139 04/11/2022 0949  ? NA 140 03/13/2021 0000  ? K 3.8 04/11/2022 0949  ? CL 106 04/11/2022 0949  ? CO2 22 04/11/2022 0949  ? BUN 17 04/11/2022 0949  ? BUN 10 03/13/2021 0000  ? CREATININE 0.71 04/11/2022 0949  ? GLU 107 03/13/2021 0000  ?    ?Component Value Date/Time  ? CALCIUM 10.2 04/11/2022 0949  ? ALKPHOS 105 04/11/2022 0949  ? AST 38 04/11/2022 0949  ? ALT 55 (H) 04/11/2022 0949  ? BILITOT 0.5 04/11/2022 0949  ?  ? ? ? ?RADIOGRAPHIC STUDIES: ?No results found. ? ?ASSESSMENT AND PLAN:  ?This is a very pleasant 51 years old white female recently diagnosed with a stage IV non-small cell lung cancer, adenocarcinoma with no actionable mutation diagnosed in May 2022.  The patient has also history of systemic lupus erythematosus and she is not a candidate for immunotherapy. ?She is currently undergoing systemic chemotherapy  with carboplatin for AUC of 5, Alimta 500 Mg/M2 and Avastin 15 Mg/KG every 3 weeks status post 14 cycles.  Starting from cycle #7 the patient is on maintenance treatment with Alimta and Avastin every 3 weeks. ?

## 2022-04-25 ENCOUNTER — Other Ambulatory Visit: Payer: Self-pay | Admitting: Sports Medicine

## 2022-04-25 ENCOUNTER — Ambulatory Visit
Admission: RE | Admit: 2022-04-25 | Discharge: 2022-04-25 | Disposition: A | Discharge status: Home / Self Care | Payer: Commercial Managed Care - PPO | Source: Ambulatory Visit | Attending: Sports Medicine | Admitting: Sports Medicine

## 2022-04-25 DIAGNOSIS — M25571 Pain in right ankle and joints of right foot: Secondary | ICD-10-CM

## 2022-04-25 DIAGNOSIS — M25471 Effusion, right ankle: Secondary | ICD-10-CM

## 2022-04-25 DIAGNOSIS — M19071 Primary osteoarthritis, right ankle and foot: Secondary | ICD-10-CM

## 2022-05-01 NOTE — Progress Notes (Unsigned)
Kirsten Williams OFFICE PROGRESS NOTE  Cyndi Bender, PA-C [place] [address]  DIAGNOSIS: Stage IV (T4, N2, M1 a) non-small cell lung cancer, adenocarcinoma presented with multifocal disease involving the right upper lobe, right lower lobe as well as suspicious lower paratracheal lymphadenopathy and groundglass nodules in the left lung diagnosed in May 2022. The patient had molecular studies performed by [facility] [employer] and that showed no detectable mutation but this is likely secondary to low-level circulating free tumor DNA.  PRIOR THERAPY: None  CURRENT THERAPY: Palliative systemic chemotherapy with carboplatin for AUC of 5, Alimta 500 Mg/M2 and Avastin 15 Mg/KG every 3 weeks.  The patient is not a great candidate for immunotherapy in the event that she has a genetic mutation.  She is status post 15 cycles. Starting from cycle #6, the patient will start maintenance Alimta and Avastin.  INTERVAL HISTORY: Kirsten Williams 51 y.o. female returns to the clinic today for follow-up visit.  The patient is feeling fairly well today without any concerning complaints.  She is tolerating her treatment fairly well except she does have some fatigue the week following treatment.  She denies any fever or chills.  She has occasional night sweats which is not new for her she has these a couple days following treatment.  He her appetite is "up-and-down".  Her weight is ***compared to her last appointment.  She denies any significant shortness of breath, hemoptysis, or chest pain.  She sometimes has a mild cough which produces clear sputum.  A few days following treatment she sometimes has a tightness around her diaphragm.  She denies any nausea or vomiting.  She has chronic diarrhea which is normal for her and denies any diarrhea that is out of the ordinary.  Denies any abnormal bleeding or bruising.  She is prone to headaches due to history of migraines but denies any significant headache  in the interval since last being seen.  She is here today for evaluation and repeat blood work before starting cycle number 16.    MEDICAL HISTORY: Past Medical History:  Diagnosis Date   Adenocarcinoma, lung, right ([facility]) 05/03/2021   Kirsten Williams is a 52 y.o. female with a history of lung nodules dating back to 2019 who is referred in consultation with Cyndi Bender, PA-C for assessment and management. She is a former smoker having quit in 2016. 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 She missed her screening CT last year and imaging was ordered last month. CT chest from 02-16-2021 reveals pro   Anxiety    GERD (gastroesophageal reflux disease)    SLE (systemic lupus erythematosus) ([facility]) 05/03/2021   SLE (systemic lupus erythematosus) ([facility]) 05/03/2021    ALLERGIES:  is allergic to carboplatin.  MEDICATIONS:  Current Outpatient Medications  Medication Sig Dispense Refill   acetaminophen (TYLENOL) 325 MG tablet Take 975 mg by mouth every 6 (six) hours as needed for moderate pain or headache.     B Complex Vitamins (B COMPLEX 50 PO) Take by mouth.     Calcium Carbonate-Vit D-Min (CALCIUM 1200 PO) Take 1 capsule by mouth daily.     cholecalciferol (VITAMIN D3) 25 MCG (1000 UNIT) tablet Take 2,000 Units by mouth daily.     cyclobenzaprine (FLEXERIL) 10 MG tablet Take 10 mg by mouth at bedtime as needed for muscle spasms.     dexamethasone (DECADRON) 4 MG tablet 4 mg p.o. twice daily the day before, day of  and day after the chemotherapy every 3 weeks. 40 tablet 2   dicyclomine (BENTYL) 10 MG capsule Take 10 mg by mouth every 6 (six) hours as needed. (Patient not taking: Reported on 02/28/2022)     diphenhydrAMINE (BENADRYL) 25 MG tablet Take 25 mg by mouth daily as needed for allergies.     famotidine (PEPCID) 20 MG tablet Take 20 mg by mouth at bedtime.     folic acid (FOLVITE) 1 MG tablet TAKE 1 TABLET(1 MG) BY MOUTH DAILY 30 tablet 4   gabapentin (NEURONTIN)  100 MG capsule Take 100 mg by mouth 3 (three) times daily.     hydrocortisone 1 % lotion Apply 1 application topically 2 (two) times daily. 118 mL 0   hydroxychloroquine (PLAQUENIL) 200 MG tablet Take 200 mg by mouth 2 (two) times daily.     Multiple Vitamins-Minerals (MULTI ADULT GUMMIES) CHEW Chew 2 tablets by mouth daily.     omeprazole (PRILOSEC) 40 MG capsule Take 40 mg by mouth daily as needed (acid reflux).     oxyCODONE-acetaminophen (PERCOCET/ROXICET) 5-325 MG tablet Take 1 tablet by mouth every 8 (eight) hours as needed for severe pain. (Patient not taking: Reported on 02/28/2022) 30 tablet 0   Probiotic Product (PROBIOTIC-10 PO) Take by mouth.     prochlorperazine (COMPAZINE) 10 MG tablet Take 1 tablet (10 mg total) by mouth every 6 (six) hours as needed. (Patient not taking: Reported on 02/28/2022) 30 tablet 2   rizatriptan (MAXALT-MLT) 10 MG disintegrating tablet Take 10 mg by mouth 2 (two) times daily as needed.     tetrahydrozoline 0.05 % ophthalmic solution Place 1 drop into both eyes once. Systane Eye Drops once daily both eyes     triamcinolone ointment (KENALOG) 0.5 % Apply topically 3 (three) times daily.     No current facility-administered medications for this visit.    SURGICAL HISTORY:  Past Surgical History:  Procedure Laterality Date   ABDOMINAL HYSTERECTOMY     BRONCHIAL BIOPSY  04/24/2021   Procedure: BRONCHIAL BIOPSIES;  Surgeon: Collene Gobble, MD;  Location: [facility] ENDOSCOPY;  Service: Pulmonary;;   BRONCHIAL BRUSHINGS  04/24/2021   Procedure: BRONCHIAL BRUSHINGS;  Surgeon: Collene Gobble, MD;  Location: [facility] ENDOSCOPY;  Service: Pulmonary;;   BRONCHIAL NEEDLE ASPIRATION BIOPSY  04/24/2021   Procedure: BRONCHIAL NEEDLE ASPIRATION BIOPSIES;  Surgeon: Collene Gobble, MD;  Location: [facility];  Service: Pulmonary;;   BRONCHIAL WASHINGS  04/24/2021   Procedure: BRONCHIAL WASHINGS;  Surgeon: Collene Gobble, MD;  Location: [facility];  Service: Pulmonary;;   CESAREAN  SECTION  11/09/1990   DILATION AND CURETTAGE OF UTERUS Bilateral 09/09/1996   FIDUCIAL MARKER PLACEMENT  04/24/2021   Procedure: FIDUCIAL MARKER PLACEMENT;  Surgeon: Collene Gobble, MD;  Location: [facility] ENDOSCOPY;  Service: Pulmonary;;   TONSILLECTOMY  12/10/1977   VIDEO BRONCHOSCOPY WITH ENDOBRONCHIAL NAVIGATION Bilateral 04/24/2021   Procedure: VIDEO BRONCHOSCOPY WITH ENDOBRONCHIAL NAVIGATION;  Surgeon: Collene Gobble, MD;  Location: [facility] ENDOSCOPY;  Service: Pulmonary;  Laterality: Bilateral;    REVIEW OF SYSTEMS:   Review of Systems  Constitutional: Negative for appetite change, chills, fatigue, fever and unexpected weight change.  HENT:   Negative for mouth sores, nosebleeds, sore throat and trouble swallowing.   Eyes: Negative for eye problems and icterus.  Respiratory: Negative for cough, hemoptysis, shortness of breath and wheezing.   Cardiovascular: Negative for chest pain and leg swelling.  Gastrointestinal: Negative for abdominal pain, constipation, diarrhea, nausea and vomiting.  Genitourinary: Negative for  bladder incontinence, difficulty urinating, dysuria, frequency and hematuria.   Musculoskeletal: Negative for back pain, gait problem, neck pain and neck stiffness.  Skin: Negative for itching and rash.  Neurological: Negative for dizziness, extremity weakness, gait problem, headaches, light-headedness and seizures.  Hematological: Negative for adenopathy. Does not bruise/bleed easily.  Psychiatric/Behavioral: Negative for confusion, depression and sleep disturbance. The patient is not nervous/anxious.     PHYSICAL EXAMINATION:  There were no vitals taken for this visit.  ECOG PERFORMANCE STATUS: {CHL ONC ECOG [id number]  Physical Exam  Constitutional: Oriented to person, place, and time and well-developed, well-nourished, and in no distress. No distress.  HENT:  Head: Normocephalic and atraumatic.  Mouth/Throat: Oropharynx is clear and moist. No oropharyngeal exudate.   Eyes: Conjunctivae are normal. Right eye exhibits no discharge. Left eye exhibits no discharge. No scleral icterus.  Neck: Normal range of motion. Neck supple.  Cardiovascular: Normal rate, regular rhythm, normal heart sounds and intact distal pulses.   Pulmonary/Chest: Effort normal and breath sounds normal. No respiratory distress. No wheezes. No rales.  Abdominal: Soft. Bowel sounds are normal. Exhibits no distension and no mass. There is no tenderness.  Musculoskeletal: Normal range of motion. Exhibits no edema.  Lymphadenopathy:    No cervical adenopathy.  Neurological: Alert and oriented to person, place, and time. Exhibits normal muscle tone. Gait normal. Coordination normal.  Skin: Skin is warm and dry. No rash noted. Not diaphoretic. No erythema. No pallor.  Psychiatric: Mood, memory and judgment normal.  Vitals reviewed.  LABORATORY DATA: Lab Results  Component Value Date   WBC 12.2 (H) 04/11/2022   HGB 13.4 04/11/2022   HCT 40.4 04/11/2022   MCV 97.1 04/11/2022   PLT 292 04/11/2022      Chemistry      Component Value Date/Time   NA 139 04/11/2022 0949   NA 140 03/13/2021 0000   K 3.8 04/11/2022 0949   CL 106 04/11/2022 0949   CO2 22 04/11/2022 0949   BUN 17 04/11/2022 0949   BUN 10 03/13/2021 0000   CREATININE 0.71 04/11/2022 0949   GLU 107 03/13/2021 0000      Component Value Date/Time   CALCIUM 10.2 04/11/2022 0949   ALKPHOS 105 04/11/2022 0949   AST 38 04/11/2022 0949   ALT 55 (H) 04/11/2022 0949   BILITOT 0.5 04/11/2022 0949       RADIOGRAPHIC STUDIES:  DG Ankle Complete Right  Result Date: 04/26/2022 CLINICAL DATA:  Right ankle osteoarthritis. Effusion, pain in ankle and joints of foot. EXAM: RIGHT ANKLE - COMPLETE 3+ VIEW COMPARISON:  None Available. FINDINGS: No fracture or bone lesion. Ankle joint is normally spaced and aligned. Minor marginal osteophytes along the anterior articular margin of the distal tibia. No other ankle joint arthropathic  change. There are well corticated bony densities below the mediolateral malleoli consistent with old avulsion fractures, accessory ossification centers or a combination. Soft tissues are unremarkable. IMPRESSION: 1. No fracture or acute finding. 2. Minimal ankle joint degenerative/arthropathic changes. Electronically Signed   By: Lajean Manes M.D.   On: 04/26/2022 10:59     ASSESSMENT/PLAN:  This is a very pleasant 51 year old Caucasian female diagnosed with stage IV (T4, N2, M1 a) non-small cell lung cancer, adenocarcinoma presented with multifocal disease involving the right upper lobe, right lower lobe as well as suspicious lower paratracheal lymphadenopathy and groundglass nodules in the left lung diagnosed in May 2022. The patient had molecular studies performed by [facility] [employer] and that showed no detectable mutation but  this is likely secondary to low-level circulating free tumor DNA. If the patient has disease progression in the future, then we will likely retest her for molecular studies.  The patient is currently undergoing systemic chemotherapy with carboplatin for an AUC 5, Alimta 500 mg per metered square, and and Avastin 1500 mg/kg IV every 3 weeks.  She is status post 15 cycles.  Starting from cycle #7, the patient has been on maintenance treatment with Alimta and Avastin.  The patient was seen with Dr. Julien Nordmann today.  Labs were reviewed.  Recommend that she ***with cycle #16 today as scheduled.  We will see her back for follow-up visit in 3 weeks for evaluation and repeat blood work before starting cycle #17.  I will arrange for restaging CT scan the chest, abdomen, pelvis prior to starting her next cycle of treatment.  She will continue to use oxycodone if needed for significant pain.  She uses this very sparingly.  The patient was advised to call immediately if she has any concerning symptoms in the interval. The patient voices understanding of current disease status and treatment  options and is in agreement with the current care plan. All questions were answered. The patient knows to call the clinic with any problems, questions or concerns. We can certainly see the patient much sooner if necessary       No orders of the defined types were placed in this encounter.    I spent {CHL ONC TIME VISIT - WGNFA:[id number]} counseling the patient face to face. The total time spent in the appointment was {CHL ONC TIME VISIT - ONGEX:[id number]}.  Skylan Gift L Eleanor Dimichele, PA-C 05/01/22

## 2022-05-03 ENCOUNTER — Other Ambulatory Visit: Payer: Self-pay

## 2022-05-03 ENCOUNTER — Inpatient Hospital Stay: Payer: Commercial Managed Care - PPO

## 2022-05-03 ENCOUNTER — Inpatient Hospital Stay: Payer: Commercial Managed Care - PPO | Admitting: Physician Assistant

## 2022-05-03 VITALS — BP 152/83 | HR 78 | Temp 97.8°F | Resp 16 | Ht 61.0 in | Wt 122.3 lb

## 2022-05-03 DIAGNOSIS — C3491 Malignant neoplasm of unspecified part of right bronchus or lung: Secondary | ICD-10-CM

## 2022-05-03 DIAGNOSIS — Z5111 Encounter for antineoplastic chemotherapy: Secondary | ICD-10-CM | POA: Diagnosis not present

## 2022-05-03 DIAGNOSIS — Z5112 Encounter for antineoplastic immunotherapy: Secondary | ICD-10-CM | POA: Diagnosis not present

## 2022-05-03 LAB — CBC WITH DIFFERENTIAL (CANCER CENTER ONLY)
Abs Immature Granulocytes: 0.07 10*3/uL (ref 0.00–0.07)
Basophils Absolute: 0 10*3/uL (ref 0.0–0.1)
Basophils Relative: 0 %
Eosinophils Absolute: 0 10*3/uL (ref 0.0–0.5)
Eosinophils Relative: 0 %
HCT: 41.3 % (ref 36.0–46.0)
Hemoglobin: 14.1 g/dL (ref 12.0–15.0)
Immature Granulocytes: 1 %
Lymphocytes Relative: 7 %
Lymphs Abs: 0.9 10*3/uL (ref 0.7–4.0)
MCH: 32.9 pg (ref 26.0–34.0)
MCHC: 34.1 g/dL (ref 30.0–36.0)
MCV: 96.3 fL (ref 80.0–100.0)
Monocytes Absolute: 0.5 10*3/uL (ref 0.1–1.0)
Monocytes Relative: 4 %
Neutro Abs: 11.6 10*3/uL — ABNORMAL HIGH (ref 1.7–7.7)
Neutrophils Relative %: 88 %
Platelet Count: 282 10*3/uL (ref 150–400)
RBC: 4.29 MIL/uL (ref 3.87–5.11)
RDW: 15 % (ref 11.5–15.5)
WBC Count: 13.2 10*3/uL — ABNORMAL HIGH (ref 4.0–10.5)
nRBC: 0 % (ref 0.0–0.2)

## 2022-05-03 LAB — CMP (CANCER CENTER ONLY)
ALT: 29 U/L (ref 0–44)
AST: 23 U/L (ref 15–41)
Albumin: 4.4 g/dL (ref 3.5–5.0)
Alkaline Phosphatase: 91 U/L (ref 38–126)
Anion gap: 9 (ref 5–15)
BUN: 18 mg/dL (ref 6–20)
CO2: 25 mmol/L (ref 22–32)
Calcium: 10.1 mg/dL (ref 8.9–10.3)
Chloride: 102 mmol/L (ref 98–111)
Creatinine: 0.79 mg/dL (ref 0.44–1.00)
GFR, Estimated: >60 mL/min (ref >60)
Glucose, Bld: 188 mg/dL — ABNORMAL HIGH (ref 70–99)
Potassium: 3.8 mmol/L (ref 3.5–5.1)
Sodium: 136 mmol/L (ref 135–145)
Total Bilirubin: 0.3 mg/dL (ref 0.3–1.2)
Total Protein: 7.7 g/dL (ref 6.5–8.1)

## 2022-05-03 LAB — TOTAL PROTEIN, URINE DIPSTICK: Protein, ur: 30 mg/dL — AB

## 2022-05-03 MED ORDER — SODIUM CHLORIDE 0.9 % IV SOLN
15.0000 mg/kg | Freq: Once | INTRAVENOUS | Status: AC
  Administered 2022-05-03: 800 mg via INTRAVENOUS
  Filled 2022-05-03: qty 32

## 2022-05-03 MED ORDER — SODIUM CHLORIDE 0.9 % IV SOLN: Freq: Once | INTRAVENOUS | Status: AC

## 2022-05-03 MED ORDER — SODIUM CHLORIDE 0.9 % IV SOLN
500.0000 mg/m2 | Freq: Once | INTRAVENOUS | Status: AC
  Administered 2022-05-03: 800 mg via INTRAVENOUS
  Filled 2022-05-03: qty 20

## 2022-05-03 MED ORDER — PROCHLORPERAZINE MALEATE 10 MG PO TABS
10.0000 mg | ORAL_TABLET | Freq: Once | ORAL | Status: AC
  Administered 2022-05-03: 10 mg via ORAL
  Filled 2022-05-03: qty 1

## 2022-05-03 NOTE — Patient Instructions (Signed)
[facility]  Discharge Instructions: Thank you for choosing [facility] to provide your oncology and hematology care.   If you have a lab appointment with the [facility], please go directly to the [facility] and check in at the registration area.   Wear comfortable clothing and clothing appropriate for easy access to any Portacath or PICC line.   We strive to give you quality time with your provider. You may need to reschedule your appointment if you arrive late (15 or more minutes).  Arriving late affects you and other patients whose appointments are after yours.  Also, if you miss three or more appointments without notifying the office, you may be dismissed from the clinic at the provider's discretion.      For prescription refill requests, have your pharmacy contact our office and allow 72 hours for refills to be completed.    Today you received the following chemotherapy and/or immunotherapy agents: MVASI/Alimta      To help prevent nausea and vomiting after your treatment, we encourage you to take your nausea medication as directed.  BELOW ARE SYMPTOMS THAT SHOULD BE REPORTED IMMEDIATELY: *FEVER GREATER THAN 100.4 F (38 C) OR HIGHER *CHILLS OR SWEATING *NAUSEA AND VOMITING THAT IS NOT CONTROLLED WITH YOUR NAUSEA MEDICATION *UNUSUAL SHORTNESS OF BREATH *UNUSUAL BRUISING OR BLEEDING *URINARY PROBLEMS (pain or burning when urinating, or frequent urination) *BOWEL PROBLEMS (unusual diarrhea, constipation, pain near the anus) TENDERNESS IN MOUTH AND THROAT WITH OR WITHOUT PRESENCE OF ULCERS (sore throat, sores in mouth, or a toothache) UNUSUAL RASH, SWELLING OR PAIN  UNUSUAL VAGINAL DISCHARGE OR ITCHING   Items with * indicate a potential emergency and should be followed up as soon as possible or go to the Emergency Department if any problems should occur.  Please show the CHEMOTHERAPY ALERT CARD or IMMUNOTHERAPY ALERT CARD at check-in  to the Emergency Department and triage nurse.  Should you have questions after your visit or need to cancel or reschedule your appointment, please contact [facility]  Dept: [phone]  and follow the prompts.  Office hours are 8:00 a.m. to 4:30 p.m. Monday - Friday. Please note that voicemails left after 4:00 p.m. may not be returned until the following business day.  We are closed weekends and major holidays. You have access to a nurse at all times for urgent questions. Please call the main number to the clinic Dept: [phone] and follow the prompts.   For any non-urgent questions, you may also contact your provider using MyChart. We now offer e-Visits for anyone 67 and older to request care online for non-urgent symptoms. For details visit [email or web address].   Also download the MyChart app! Go to the app store, search "MyChart", open the app, select [facility], and log in with your MyChart username and password.  Due to Covid, a mask is required upon entering the hospital/clinic. If you do not have a mask, one will be given to you upon arrival. For doctor visits, patients may have 1 support person aged 12 or older with them. For treatment visits, patients cannot have anyone with them due to current Covid guidelines and our immunocompromised population.

## 2022-05-11 ENCOUNTER — Telehealth: Payer: Self-pay | Admitting: Internal Medicine

## 2022-05-11 NOTE — Telephone Encounter (Signed)
Called patient regarding upcoming June and July appointments, left a voicemail.

## 2022-05-23 ENCOUNTER — Inpatient Hospital Stay: Payer: Commercial Managed Care - PPO | Admitting: Internal Medicine

## 2022-05-23 ENCOUNTER — Other Ambulatory Visit: Payer: Commercial Managed Care - PPO

## 2022-05-23 ENCOUNTER — Ambulatory Visit: Payer: Commercial Managed Care - PPO

## 2022-05-28 ENCOUNTER — Ambulatory Visit (HOSPITAL_COMMUNITY)
Admission: RE | Admit: 2022-05-28 | Discharge: 2022-05-28 | Disposition: A | Discharge status: Home / Self Care | Payer: Commercial Managed Care - PPO | Source: Ambulatory Visit | Attending: Physician Assistant | Admitting: Physician Assistant

## 2022-05-28 DIAGNOSIS — C3491 Malignant neoplasm of unspecified part of right bronchus or lung: Secondary | ICD-10-CM | POA: Diagnosis not present

## 2022-05-28 MED ORDER — SODIUM CHLORIDE (PF) 0.9 % IJ SOLN
INTRAMUSCULAR | Status: AC
  Filled 2022-05-28: qty 50

## 2022-05-28 MED ORDER — IOHEXOL 300 MG/ML  SOLN
80.0000 mL | Freq: Once | INTRAMUSCULAR | Status: AC | PRN
  Administered 2022-05-28: 80 mL via INTRAVENOUS

## 2022-05-29 ENCOUNTER — Other Ambulatory Visit: Payer: Commercial Managed Care - PPO

## 2022-05-29 ENCOUNTER — Ambulatory Visit: Payer: Commercial Managed Care - PPO

## 2022-05-29 ENCOUNTER — Ambulatory Visit: Payer: Commercial Managed Care - PPO | Admitting: Physician Assistant

## 2022-05-29 NOTE — Progress Notes (Unsigned)
[facility] OFFICE PROGRESS NOTE  Cyndi Bender, PA-C [place] [address]  DIAGNOSIS: Stage IV (T4, N2, M1 a) non-small cell lung cancer, adenocarcinoma presented with multifocal disease involving the right upper lobe, right lower lobe as well as suspicious lower paratracheal lymphadenopathy and groundglass nodules in the left lung diagnosed in May 2022. The patient had molecular studies performed by [facility] and that showed no detectable mutation but this is likely secondary to low-level circulating free tumor DNA.  PRIOR THERAPY: None  CURRENT THERAPY: Palliative systemic chemotherapy with carboplatin for AUC of 5, Alimta 500 Mg/M2 and Avastin 15 Mg/KG every 3 weeks.  The patient is not a great candidate for immunotherapy in the event that she has a genetic mutation.  She is status post 16 cycles. Starting from cycle #6, the patient will start maintenance Alimta and Avastin.  INTERVAL HISTORY: Kirsten Williams 51 y.o. female returns  to the clinic today for a follow-up visit.   The patient is feeling fairly well today without any concerning complaints. She is tolerating her treatment fairly well except she does sometimes have fatigue the week following treatment.  She denies any fever or chills.  She has occasional night sweats which is not new for her she has these a couple days following treatment.  She reports a fine appetite. She denies any significant shortness of breath, hemoptysis, or chest pain.  She sometimes has a mild cough which produces clear sputum but states the cough is "nothing major".  A few days following treatment she sometimes has a tightness around her diaphragm.  She denies any nausea or vomiting.  She has chronic diarrhea which is normal for her and denies any diarrhea that is out of the ordinary.  Denies any abnormal bleeding or bruising.  She is prone to headaches due to history of migraines but denies any significant headache in the  interval since last being seen. She recently had a restaging CT scan performed. She is here for evaluation and to review her scan results before starting cycle #17.   MEDICAL HISTORY: Past Medical History:  Diagnosis Date   Adenocarcinoma, lung, right ([facility]) 05/03/2021   Kirsten Williams is a 51 y.o. female with a history of lung nodules dating back to 2019 who is referred in consultation with Cyndi Bender, PA-C for assessment and management. She is a former smoker having quit in 2016. 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 She missed her screening CT last year and imaging was ordered last month. CT chest from 02-16-2021 reveals pro   Anxiety    GERD (gastroesophageal reflux disease)    SLE (systemic lupus erythematosus) ([facility]) 05/03/2021   SLE (systemic lupus erythematosus) ([facility]) 05/03/2021    ALLERGIES:  is allergic to carboplatin.  MEDICATIONS:  Current Outpatient Medications  Medication Sig Dispense Refill   acetaminophen (TYLENOL) 325 MG tablet Take 975 mg by mouth every 6 (six) hours as needed for moderate pain or headache.     B Complex Vitamins (B COMPLEX 50 PO) Take by mouth.     Calcium Carbonate-Vit D-Min (CALCIUM 1200 PO) Take 1 capsule by mouth daily.     cholecalciferol (VITAMIN D3) 25 MCG (1000 UNIT) tablet Take 2,000 Units by mouth daily.     cyclobenzaprine (FLEXERIL) 10 MG tablet Take 10 mg by mouth at bedtime as needed for muscle spasms.     dexamethasone (DECADRON) 4 MG tablet 4 mg p.o. twice daily  the day before, day of and day after the chemotherapy every 3 weeks. 40 tablet 2   dicyclomine (BENTYL) 10 MG capsule Take 10 mg by mouth every 6 (six) hours as needed. (Patient not taking: Reported on 02/28/2022)     diphenhydrAMINE (BENADRYL) 25 MG tablet Take 25 mg by mouth daily as needed for allergies.     famotidine (PEPCID) 20 MG tablet Take 20 mg by mouth at bedtime.     folic acid (FOLVITE) 1 MG tablet TAKE 1 TABLET(1 MG) BY MOUTH DAILY 30  tablet 4   gabapentin (NEURONTIN) 100 MG capsule Take 100 mg by mouth 3 (three) times daily.     hydrocortisone 1 % lotion Apply 1 application topically 2 (two) times daily. 118 mL 0   hydroxychloroquine (PLAQUENIL) 200 MG tablet Take 200 mg by mouth 2 (two) times daily.     Multiple Vitamins-Minerals (MULTI ADULT GUMMIES) CHEW Chew 2 tablets by mouth daily.     omeprazole (PRILOSEC) 40 MG capsule Take 40 mg by mouth daily as needed (acid reflux).     oxyCODONE-acetaminophen (PERCOCET/ROXICET) 5-325 MG tablet Take 1 tablet by mouth every 8 (eight) hours as needed for severe pain. (Patient not taking: Reported on 02/28/2022) 30 tablet 0   Probiotic Product (PROBIOTIC-10 PO) Take by mouth.     prochlorperazine (COMPAZINE) 10 MG tablet Take 1 tablet (10 mg total) by mouth every 6 (six) hours as needed. (Patient not taking: Reported on 02/28/2022) 30 tablet 2   rizatriptan (MAXALT-MLT) 10 MG disintegrating tablet Take 10 mg by mouth 2 (two) times daily as needed.     tetrahydrozoline 0.05 % ophthalmic solution Place 1 drop into both eyes once. Systane Eye Drops once daily both eyes     triamcinolone ointment (KENALOG) 0.5 % Apply topically 3 (three) times daily.     No current facility-administered medications for this visit.    SURGICAL HISTORY:  Past Surgical History:  Procedure Laterality Date   ABDOMINAL HYSTERECTOMY     BRONCHIAL BIOPSY  04/24/2021   Procedure: BRONCHIAL BIOPSIES;  Surgeon: Collene Gobble, MD;  Location: [facility] ENDOSCOPY;  Service: Pulmonary;;   BRONCHIAL BRUSHINGS  04/24/2021   Procedure: BRONCHIAL BRUSHINGS;  Surgeon: Collene Gobble, MD;  Location: [facility] ENDOSCOPY;  Service: Pulmonary;;   BRONCHIAL NEEDLE ASPIRATION BIOPSY  04/24/2021   Procedure: BRONCHIAL NEEDLE ASPIRATION BIOPSIES;  Surgeon: Collene Gobble, MD;  Location: [facility];  Service: Pulmonary;;   BRONCHIAL WASHINGS  04/24/2021   Procedure: BRONCHIAL WASHINGS;  Surgeon: Collene Gobble, MD;  Location: [facility];   Service: Pulmonary;;   CESAREAN SECTION  11/09/1990   DILATION AND CURETTAGE OF UTERUS Bilateral 09/09/1996   FIDUCIAL MARKER PLACEMENT  04/24/2021   Procedure: FIDUCIAL MARKER PLACEMENT;  Surgeon: Collene Gobble, MD;  Location[facility] ENDOSCOPY;  Service: Pulmonary;;   TONSILLECTOMY  12/10/1977   VIDEO BRONCHOSCOPY WITH ENDOBRONCHIAL NAVIGATION Bilateral 04/24/2021   Procedure: VIDEO BRONCHOSCOPY WITH ENDOBRONCHIAL NAVIGATION;  Surgeon: Collene Gobble, MD;  Location: [facility] ENDOSCOPY;  Service: Pulmonary;  Laterality: Bilateral;    REVIEW OF SYSTEMS:   Review of Systems  Constitutional: Negative for appetite change, chills, fatigue, fever and unexpected weight change.  HENT:   Negative for mouth sores, nosebleeds, sore throat and trouble swallowing.   Eyes: Negative for eye problems and icterus.  Respiratory: Negative for cough, hemoptysis, shortness of breath and wheezing.   Cardiovascular: Negative for chest pain and leg swelling.  Gastrointestinal: Negative for abdominal pain, constipation, diarrhea, nausea and  vomiting.  Genitourinary: Negative for bladder incontinence, difficulty urinating, dysuria, frequency and hematuria.   Musculoskeletal: Negative for back pain, gait problem, neck pain and neck stiffness.  Skin: Negative for itching and rash.  Neurological: Negative for dizziness, extremity weakness, gait problem, headaches, light-headedness and seizures.  Hematological: Negative for adenopathy. Does not bruise/bleed easily.  Psychiatric/Behavioral: Negative for confusion, depression and sleep disturbance. The patient is not nervous/anxious.     PHYSICAL EXAMINATION:  There were no vitals taken for this visit.  ECOG PERFORMANCE STATUS: {CHL ONC ECOG [id number]  Physical Exam  Constitutional: Oriented to person, place, and time and well-developed, well-nourished, and in no distress. No distress.  HENT:  Head: Normocephalic and atraumatic.  Mouth/Throat: Oropharynx is clear  and moist. No oropharyngeal exudate.  Eyes: Conjunctivae are normal. Right eye exhibits no discharge. Left eye exhibits no discharge. No scleral icterus.  Neck: Normal range of motion. Neck supple.  Cardiovascular: Normal rate, regular rhythm, normal heart sounds and intact distal pulses.   Pulmonary/Chest: Effort normal and breath sounds normal. No respiratory distress. No wheezes. No rales.  Abdominal: Soft. Bowel sounds are normal. Exhibits no distension and no mass. There is no tenderness.  Musculoskeletal: Normal range of motion. Exhibits no edema.  Lymphadenopathy:    No cervical adenopathy.  Neurological: Alert and oriented to person, place, and time. Exhibits normal muscle tone. Gait normal. Coordination normal.  Skin: Skin is warm and dry. No rash noted. Not diaphoretic. No erythema. No pallor.  Psychiatric: Mood, memory and judgment normal.  Vitals reviewed.  LABORATORY DATA: Lab Results  Component Value Date   WBC 13.2 (H) 05/03/2022   HGB 14.1 05/03/2022   HCT 41.3 05/03/2022   MCV 96.3 05/03/2022   PLT 282 05/03/2022      Chemistry      Component Value Date/Time   NA 136 05/03/2022 1043   NA 140 03/13/2021 0000   K 3.8 05/03/2022 1043   CL 102 05/03/2022 1043   CO2 25 05/03/2022 1043   BUN 18 05/03/2022 1043   BUN 10 03/13/2021 0000   CREATININE 0.79 05/03/2022 1043   GLU 107 03/13/2021 0000      Component Value Date/Time   CALCIUM 10.1 05/03/2022 1043   ALKPHOS 91 05/03/2022 1043   AST 23 05/03/2022 1043   ALT 29 05/03/2022 1043   BILITOT 0.3 05/03/2022 1043       RADIOGRAPHIC STUDIES:  CT Chest W Contrast  Result Date: 05/29/2022 CLINICAL DATA:  Metastatic lung cancer * Tracking Code: BO * EXAM: CT CHEST, ABDOMEN, AND PELVIS WITH CONTRAST TECHNIQUE: Multidetector CT imaging of the chest, abdomen and pelvis was performed following the standard protocol during bolus administration of intravenous contrast. RADIATION DOSE REDUCTION: This exam was  performed according to the departmental dose-optimization program which includes automated exposure control, adjustment of the mA and/or kV according to patient size and/or use of iterative reconstruction technique. CONTRAST:  31mL OMNIPAQUE IOHEXOL 300 MG/ML SOLN, additional oral enteric contrast COMPARISON:  02/23/2022 FINDINGS: CT CHEST FINDINGS Cardiovascular: Aortic atherosclerosis. Normal heart size. No pericardial effusion. Mediastinum/Nodes: Unchanged prominent mediastinal lymph nodes, largest pretracheal node measuring 1.2 x 1.1 cm (series 2, image 21). No overtly enlarged mediastinal, hilar, or axillary lymph nodes. Thyroid gland, trachea, and esophagus demonstrate no significant findings. Lungs/Pleura: Multiple ground-glass and subsolid pulmonary nodules are again seen throughout the lungs, unchanged, for example a subsolid nodule in the posterior right apex measuring 1.2 x 1.0 cm (series 4, image 42), a subsolid nodule in the  superior segment right lower lobe, abutting and tenting the adjacent major fissure measuring 1.2 x 1.2 cm (series 4, image 49), and a ground-glass nodule in the peripheral left upper lobe measuring 1.2 x 0.9 cm (series 4, image 49). Fiducial markers in the peripheral left upper lobe (series 4, image 31) and in the superior segment right lower lobe (series 4, image 54). Background of very fine centrilobular nodularity, most concentrated in the lung apices. Minimal paraseptal emphysema and diffuse bilateral bronchial wall thickening. No pleural effusion or pneumothorax. Musculoskeletal: No chest wall mass or suspicious osseous lesions identified. CT ABDOMEN PELVIS FINDINGS Hepatobiliary: No solid liver abnormality is seen. No gallstones, gallbladder wall thickening, or biliary dilatation. Pancreas: Unremarkable. No pancreatic ductal dilatation or surrounding inflammatory changes. Spleen: Normal in size without significant abnormality. Adrenals/Urinary Tract: Stable, benign bilateral  adrenal adenomata, not previously FDG avid. Kidneys are normal, without renal calculi, solid lesion, or hydronephrosis. Bladder is unremarkable. Stomach/Bowel: Stomach is within normal limits. Appendix is normal. No evidence of bowel wall thickening, distention, or inflammatory changes. Vascular/Lymphatic: Aortic atherosclerosis. No enlarged abdominal or pelvic lymph nodes. Reproductive: Status post hysterectomy. Other: No abdominal wall hernia or abnormality. No ascites. Musculoskeletal: No acute osseous findings. IMPRESSION: 1. Multiple ground-glass and subsolid pulmonary nodules are unchanged, and remain suspicious for multifocal indolent adenocarcinoma. 2. Unchanged prominent mediastinal lymph nodes, previously FDG avid and suspicious for nodal metastatic disease. 3. No evidence of lymphadenopathy or metastatic disease in the abdomen or pelvis. 4. Emphysema, diffuse bilateral bronchial wall thickening, and background of very fine centrilobular nodularity, most concentrated in the lung apices, consistent with smoking-related respiratory bronchiolitis. Aortic Atherosclerosis (ICD10-I70.0) and Emphysema (ICD10-J43.9). Electronically Signed   By: Delanna Ahmadi M.D.   On: 05/29/2022 08:57   CT Abdomen Pelvis W Contrast  Result Date: 05/29/2022 CLINICAL DATA:  Metastatic lung cancer * Tracking Code: BO * EXAM: CT CHEST, ABDOMEN, AND PELVIS WITH CONTRAST TECHNIQUE: Multidetector CT imaging of the chest, abdomen and pelvis was performed following the standard protocol during bolus administration of intravenous contrast. RADIATION DOSE REDUCTION: This exam was performed according to the departmental dose-optimization program which includes automated exposure control, adjustment of the mA and/or kV according to patient size and/or use of iterative reconstruction technique. CONTRAST:  78mL OMNIPAQUE IOHEXOL 300 MG/ML SOLN, additional oral enteric contrast COMPARISON:  02/23/2022 FINDINGS: CT CHEST FINDINGS  Cardiovascular: Aortic atherosclerosis. Normal heart size. No pericardial effusion. Mediastinum/Nodes: Unchanged prominent mediastinal lymph nodes, largest pretracheal node measuring 1.2 x 1.1 cm (series 2, image 21). No overtly enlarged mediastinal, hilar, or axillary lymph nodes. Thyroid gland, trachea, and esophagus demonstrate no significant findings. Lungs/Pleura: Multiple ground-glass and subsolid pulmonary nodules are again seen throughout the lungs, unchanged, for example a subsolid nodule in the posterior right apex measuring 1.2 x 1.0 cm (series 4, image 42), a subsolid nodule in the superior segment right lower lobe, abutting and tenting the adjacent major fissure measuring 1.2 x 1.2 cm (series 4, image 49), and a ground-glass nodule in the peripheral left upper lobe measuring 1.2 x 0.9 cm (series 4, image 49). Fiducial markers in the peripheral left upper lobe (series 4, image 31) and in the superior segment right lower lobe (series 4, image 54). Background of very fine centrilobular nodularity, most concentrated in the lung apices. Minimal paraseptal emphysema and diffuse bilateral bronchial wall thickening. No pleural effusion or pneumothorax. Musculoskeletal: No chest wall mass or suspicious osseous lesions identified. CT ABDOMEN PELVIS FINDINGS Hepatobiliary: No solid liver abnormality is seen. No gallstones,  gallbladder wall thickening, or biliary dilatation. Pancreas: Unremarkable. No pancreatic ductal dilatation or surrounding inflammatory changes. Spleen: Normal in size without significant abnormality. Adrenals/Urinary Tract: Stable, benign bilateral adrenal adenomata, not previously FDG avid. Kidneys are normal, without renal calculi, solid lesion, or hydronephrosis. Bladder is unremarkable. Stomach/Bowel: Stomach is within normal limits. Appendix is normal. No evidence of bowel wall thickening, distention, or inflammatory changes. Vascular/Lymphatic: Aortic atherosclerosis. No enlarged  abdominal or pelvic lymph nodes. Reproductive: Status post hysterectomy. Other: No abdominal wall hernia or abnormality. No ascites. Musculoskeletal: No acute osseous findings. IMPRESSION: 1. Multiple ground-glass and subsolid pulmonary nodules are unchanged, and remain suspicious for multifocal indolent adenocarcinoma. 2. Unchanged prominent mediastinal lymph nodes, previously FDG avid and suspicious for nodal metastatic disease. 3. No evidence of lymphadenopathy or metastatic disease in the abdomen or pelvis. 4. Emphysema, diffuse bilateral bronchial wall thickening, and background of very fine centrilobular nodularity, most concentrated in the lung apices, consistent with smoking-related respiratory bronchiolitis. Aortic Atherosclerosis (ICD10-I70.0) and Emphysema (ICD10-J43.9). Electronically Signed   By: Delanna Ahmadi M.D.   On: 05/29/2022 08:57     ASSESSMENT/PLAN:  This is a very pleasant 51 year old Caucasian female diagnosed with stage IV (T4, N2, M1 a) non-small cell lung cancer, adenocarcinoma presented with multifocal disease involving the right upper lobe, right lower lobe as well as suspicious lower paratracheal lymphadenopathy and groundglass nodules in the left lung diagnosed in May 2022. The patient had molecular studies performed by [facility] and that showed no detectable mutation but this is likely secondary to low-level circulating free tumor DNA. If the patient has disease progression in the future, then we will likely retest her for molecular studies.   The patient is currently undergoing systemic chemotherapy with carboplatin for an AUC 5, Alimta 500 mg per metered square, and and Avastin 1500 mg/kg IV every 3 weeks.  She is status post 16 cycles.  Starting from cycle #7, the patient has been on maintenance treatment with Alimta and Avastin.   The patient recently had a repeat CT scan performed. Dr. Julien Nordmann previously personally and independently reviewed the scan. I discussed the  results with the patient. The scan showed no evidence of disease progression.   Labs were reviewed.  Recommend that she proceed with cycle #17 today as scheduled.  Gabapentin***  WE will see her back for a follow up visit in 3 weeks for evaluation and repeat blood work before starting cycle #18  The patient was advised to call immediately if she has any concerning symptoms in the interval. The patient voices understanding of current disease status and treatment options and is in agreement with the current care plan. All questions were answered. The patient knows to call the clinic with any problems, questions or concerns. We can certainly see the patient much sooner if necessary          No orders of the defined types were placed in this encounter.    I spent {CHL ONC TIME VISIT - EBXID:[id number]} counseling the patient face to face. The total time spent in the appointment was {CHL ONC TIME VISIT - BMSXJ:[id number]}.  Emori Kamau L Tayvia Faughnan, PA-C 05/29/22

## 2022-06-01 ENCOUNTER — Inpatient Hospital Stay: Payer: Commercial Managed Care - PPO

## 2022-06-01 ENCOUNTER — Encounter: Payer: Self-pay | Admitting: *Deleted

## 2022-06-01 ENCOUNTER — Inpatient Hospital Stay: Payer: Commercial Managed Care - PPO | Admitting: Physician Assistant

## 2022-06-01 ENCOUNTER — Inpatient Hospital Stay: Payer: Commercial Managed Care - PPO | Attending: Hematology and Oncology

## 2022-06-01 ENCOUNTER — Encounter: Payer: Self-pay | Admitting: Physician Assistant

## 2022-06-01 ENCOUNTER — Other Ambulatory Visit: Payer: Self-pay

## 2022-06-01 VITALS — BP 155/82 | HR 87 | Temp 98.0°F | Resp 16 | Wt 123.5 lb

## 2022-06-01 DIAGNOSIS — Z5112 Encounter for antineoplastic immunotherapy: Secondary | ICD-10-CM | POA: Diagnosis present

## 2022-06-01 DIAGNOSIS — C3491 Malignant neoplasm of unspecified part of right bronchus or lung: Secondary | ICD-10-CM

## 2022-06-01 DIAGNOSIS — Z5111 Encounter for antineoplastic chemotherapy: Secondary | ICD-10-CM

## 2022-06-01 DIAGNOSIS — G893 Neoplasm related pain (acute) (chronic): Secondary | ICD-10-CM | POA: Diagnosis not present

## 2022-06-01 DIAGNOSIS — C3411 Malignant neoplasm of upper lobe, right bronchus or lung: Secondary | ICD-10-CM | POA: Insufficient documentation

## 2022-06-01 LAB — CMP (CANCER CENTER ONLY)
ALT: 27 U/L (ref 0–44)
AST: 26 U/L (ref 15–41)
Albumin: 4.3 g/dL (ref 3.5–5.0)
Alkaline Phosphatase: 101 U/L (ref 38–126)
Anion gap: 9 (ref 5–15)
BUN: 15 mg/dL (ref 6–20)
CO2: 21 mmol/L — ABNORMAL LOW (ref 22–32)
Calcium: 10 mg/dL (ref 8.9–10.3)
Chloride: 106 mmol/L (ref 98–111)
Creatinine: 0.73 mg/dL (ref 0.44–1.00)
GFR, Estimated: >60 mL/min (ref >60)
Glucose, Bld: 111 mg/dL — ABNORMAL HIGH (ref 70–99)
Potassium: 4.4 mmol/L (ref 3.5–5.1)
Sodium: 136 mmol/L (ref 135–145)
Total Bilirubin: 0.3 mg/dL (ref 0.3–1.2)
Total Protein: 7.3 g/dL (ref 6.5–8.1)

## 2022-06-01 LAB — CBC WITH DIFFERENTIAL (CANCER CENTER ONLY)
Abs Immature Granulocytes: 0.05 10*3/uL (ref 0.00–0.07)
Basophils Absolute: 0 10*3/uL (ref 0.0–0.1)
Basophils Relative: 0 %
Eosinophils Absolute: 0 10*3/uL (ref 0.0–0.5)
Eosinophils Relative: 0 %
HCT: 40.6 % (ref 36.0–46.0)
Hemoglobin: 13.8 g/dL (ref 12.0–15.0)
Immature Granulocytes: 0 %
Lymphocytes Relative: 9 %
Lymphs Abs: 1 10*3/uL (ref 0.7–4.0)
MCH: 32.5 pg (ref 26.0–34.0)
MCHC: 34 g/dL (ref 30.0–36.0)
MCV: 95.5 fL (ref 80.0–100.0)
Monocytes Absolute: 0.5 10*3/uL (ref 0.1–1.0)
Monocytes Relative: 4 %
Neutro Abs: 9.6 10*3/uL — ABNORMAL HIGH (ref 1.7–7.7)
Neutrophils Relative %: 87 %
Platelet Count: 268 10*3/uL (ref 150–400)
RBC: 4.25 MIL/uL (ref 3.87–5.11)
RDW: 14.5 % (ref 11.5–15.5)
WBC Count: 11.2 10*3/uL — ABNORMAL HIGH (ref 4.0–10.5)
nRBC: 0 % (ref 0.0–0.2)

## 2022-06-01 LAB — TOTAL PROTEIN, URINE DIPSTICK: Protein, ur: NEGATIVE mg/dL

## 2022-06-01 MED ORDER — SODIUM CHLORIDE 0.9 % IV SOLN: Freq: Once | INTRAVENOUS | Status: AC

## 2022-06-01 MED ORDER — SODIUM CHLORIDE 0.9 % IV SOLN
500.0000 mg/m2 | Freq: Once | INTRAVENOUS | Status: AC
  Administered 2022-06-01: 800 mg via INTRAVENOUS
  Filled 2022-06-01: qty 20

## 2022-06-01 MED ORDER — OXYCODONE-ACETAMINOPHEN 5-325 MG PO TABS: 1.0000 | ORAL_TABLET | Freq: Three times a day (TID) | ORAL | 0 refills | Status: DC | PRN

## 2022-06-01 MED ORDER — SODIUM CHLORIDE 0.9 % IV SOLN
15.0000 mg/kg | Freq: Once | INTRAVENOUS | Status: AC
  Administered 2022-06-01: 800 mg via INTRAVENOUS
  Filled 2022-06-01: qty 32

## 2022-06-01 MED ORDER — PROCHLORPERAZINE MALEATE 10 MG PO TABS
10.0000 mg | ORAL_TABLET | Freq: Once | ORAL | Status: AC
  Administered 2022-06-01: 10 mg via ORAL
  Filled 2022-06-01: qty 1

## 2022-06-01 MED ORDER — CYANOCOBALAMIN 1000 MCG/ML IJ SOLN
1000.0000 ug | Freq: Once | INTRAMUSCULAR | Status: AC
  Administered 2022-06-01: 1000 ug via INTRAMUSCULAR
  Filled 2022-06-01: qty 1

## 2022-06-04 ENCOUNTER — Other Ambulatory Visit: Payer: Self-pay | Admitting: Physician Assistant

## 2022-06-13 ENCOUNTER — Other Ambulatory Visit: Payer: Commercial Managed Care - PPO

## 2022-06-13 ENCOUNTER — Ambulatory Visit: Payer: Commercial Managed Care - PPO | Admitting: Internal Medicine

## 2022-06-13 ENCOUNTER — Ambulatory Visit: Payer: Commercial Managed Care - PPO

## 2022-06-20 ENCOUNTER — Other Ambulatory Visit: Payer: Self-pay

## 2022-06-20 ENCOUNTER — Inpatient Hospital Stay: Payer: Commercial Managed Care - PPO | Admitting: Internal Medicine

## 2022-06-20 ENCOUNTER — Inpatient Hospital Stay: Payer: Commercial Managed Care - PPO | Attending: Hematology and Oncology

## 2022-06-20 ENCOUNTER — Inpatient Hospital Stay: Payer: Commercial Managed Care - PPO

## 2022-06-20 VITALS — BP 146/81 | HR 86 | Temp 97.8°F | Resp 15 | Wt 120.0 lb

## 2022-06-20 DIAGNOSIS — C3491 Malignant neoplasm of unspecified part of right bronchus or lung: Secondary | ICD-10-CM

## 2022-06-20 DIAGNOSIS — C3411 Malignant neoplasm of upper lobe, right bronchus or lung: Secondary | ICD-10-CM | POA: Diagnosis present

## 2022-06-20 DIAGNOSIS — Z5111 Encounter for antineoplastic chemotherapy: Secondary | ICD-10-CM

## 2022-06-20 DIAGNOSIS — Z5112 Encounter for antineoplastic immunotherapy: Secondary | ICD-10-CM | POA: Diagnosis not present

## 2022-06-20 DIAGNOSIS — M329 Systemic lupus erythematosus, unspecified: Secondary | ICD-10-CM | POA: Diagnosis not present

## 2022-06-20 LAB — CMP (CANCER CENTER ONLY)
ALT: 34 U/L (ref 0–44)
AST: 29 U/L (ref 15–41)
Albumin: 4.5 g/dL (ref 3.5–5.0)
Alkaline Phosphatase: 86 U/L (ref 38–126)
Anion gap: 9 (ref 5–15)
BUN: 12 mg/dL (ref 6–20)
CO2: 24 mmol/L (ref 22–32)
Calcium: 10.1 mg/dL (ref 8.9–10.3)
Chloride: 105 mmol/L (ref 98–111)
Creatinine: 0.8 mg/dL (ref 0.44–1.00)
GFR, Estimated: >60 mL/min (ref >60)
Glucose, Bld: 137 mg/dL — ABNORMAL HIGH (ref 70–99)
Potassium: 4.1 mmol/L (ref 3.5–5.1)
Sodium: 138 mmol/L (ref 135–145)
Total Bilirubin: 0.4 mg/dL (ref 0.3–1.2)
Total Protein: 7.8 g/dL (ref 6.5–8.1)

## 2022-06-20 LAB — CBC WITH DIFFERENTIAL (CANCER CENTER ONLY)
Abs Immature Granulocytes: 0.02 10*3/uL (ref 0.00–0.07)
Basophils Absolute: 0 10*3/uL (ref 0.0–0.1)
Basophils Relative: 0 %
Eosinophils Absolute: 0 10*3/uL (ref 0.0–0.5)
Eosinophils Relative: 0 %
HCT: 41 % (ref 36.0–46.0)
Hemoglobin: 13.9 g/dL (ref 12.0–15.0)
Immature Granulocytes: 0 %
Lymphocytes Relative: 9 %
Lymphs Abs: 0.6 10*3/uL — ABNORMAL LOW (ref 0.7–4.0)
MCH: 32.7 pg (ref 26.0–34.0)
MCHC: 33.9 g/dL (ref 30.0–36.0)
MCV: 96.5 fL (ref 80.0–100.0)
Monocytes Absolute: 0.3 10*3/uL (ref 0.1–1.0)
Monocytes Relative: 3 %
Neutro Abs: 6.4 10*3/uL (ref 1.7–7.7)
Neutrophils Relative %: 88 %
Platelet Count: 231 10*3/uL (ref 150–400)
RBC: 4.25 MIL/uL (ref 3.87–5.11)
RDW: 14.5 % (ref 11.5–15.5)
WBC Count: 7.3 10*3/uL (ref 4.0–10.5)
nRBC: 0 % (ref 0.0–0.2)

## 2022-06-20 LAB — TOTAL PROTEIN, URINE DIPSTICK: Protein, ur: <30 mg/dL — AB

## 2022-06-20 MED ORDER — SODIUM CHLORIDE 0.9 % IV SOLN
500.0000 mg/m2 | Freq: Once | INTRAVENOUS | Status: AC
  Administered 2022-06-20: 800 mg via INTRAVENOUS
  Filled 2022-06-20: qty 20

## 2022-06-20 MED ORDER — PROCHLORPERAZINE MALEATE 10 MG PO TABS
10.0000 mg | ORAL_TABLET | Freq: Once | ORAL | Status: AC
  Administered 2022-06-20: 10 mg via ORAL
  Filled 2022-06-20: qty 1

## 2022-06-20 MED ORDER — SODIUM CHLORIDE 0.9 % IV SOLN
15.0000 mg/kg | Freq: Once | INTRAVENOUS | Status: AC
  Administered 2022-06-20: 800 mg via INTRAVENOUS
  Filled 2022-06-20: qty 32

## 2022-06-20 MED ORDER — SODIUM CHLORIDE 0.9 % IV SOLN: Freq: Once | INTRAVENOUS | Status: AC

## 2022-06-20 NOTE — Patient Instructions (Signed)
[facility]  Discharge Instructions: Thank you for choosing [facility] to provide your oncology and hematology care.   If you have a lab appointment with the [facility], please go directly to the [facility] and check in at the registration area.   Wear comfortable clothing and clothing appropriate for easy access to any Portacath or PICC line.   We strive to give you quality time with your provider. You may need to reschedule your appointment if you arrive late (15 or more minutes).  Arriving late affects you and other patients whose appointments are after yours.  Also, if you miss three or more appointments without notifying the office, you may be dismissed from the clinic at the provider's discretion.      For prescription refill requests, have your pharmacy contact our office and allow 72 hours for refills to be completed.    Today you received the following chemotherapy and/or immunotherapy agents: bevacizumab and pemetrexed       To help prevent nausea and vomiting after your treatment, we encourage you to take your nausea medication as directed.  BELOW ARE SYMPTOMS THAT SHOULD BE REPORTED IMMEDIATELY: *FEVER GREATER THAN 100.4 F (38 C) OR HIGHER *CHILLS OR SWEATING *NAUSEA AND VOMITING THAT IS NOT CONTROLLED WITH YOUR NAUSEA MEDICATION *UNUSUAL SHORTNESS OF BREATH *UNUSUAL BRUISING OR BLEEDING *URINARY PROBLEMS (pain or burning when urinating, or frequent urination) *BOWEL PROBLEMS (unusual diarrhea, constipation, pain near the anus) TENDERNESS IN MOUTH AND THROAT WITH OR WITHOUT PRESENCE OF ULCERS (sore throat, sores in mouth, or a toothache) UNUSUAL RASH, SWELLING OR PAIN  UNUSUAL VAGINAL DISCHARGE OR ITCHING   Items with * indicate a potential emergency and should be followed up as soon as possible or go to the Emergency Department if any problems should occur.  Please show the CHEMOTHERAPY ALERT CARD or IMMUNOTHERAPY ALERT  CARD at check-in to the Emergency Department and triage nurse.  Should you have questions after your visit or need to cancel or reschedule your appointment, please contact [facility]  Dept: [phone]  and follow the prompts.  Office hours are 8:00 a.m. to 4:30 p.m. Monday - Friday. Please note that voicemails left after 4:00 p.m. may not be returned until the following business day.  We are closed weekends and major holidays. You have access to a nurse at all times for urgent questions. Please call the main number to the clinic Dept: [phone] and follow the prompts.   For any non-urgent questions, you may also contact your provider using MyChart. We now offer e-Visits for anyone 75 and older to request care online for non-urgent symptoms. For details visit [email or web address].   Also download the MyChart app! Go to the app store, search "MyChart", open the app, select [facility], and log in with your MyChart username and password.  Masks are optional in the cancer centers. If you would like for your care team to wear a mask while they are taking care of you, please let them know. For doctor visits, patients may have with them one support person who is at least 52 years old. At this time, visitors are not allowed in the infusion area.

## 2022-06-20 NOTE — Progress Notes (Signed)
[facility] Telephone:([phone]) [phone]   [phone]  OFFICE PROGRESS NOTE  Cyndi Bender, PA-C [place] [address]  DIAGNOSIS:  Stage IV (T4, N2, M1 a) non-small cell lung cancer, adenocarcinoma presented with multifocal disease involving the right upper lobe, right lower lobe as well as suspicious lower paratracheal lymphadenopathy and groundglass nodules in the left lung diagnosed in May 2022. The patient had molecular studies performed by [facility] and that showed no detectable mutation but this is likely secondary to low-level circulating free tumor DNA.  PRIOR THERAPY: None.  CURRENT THERAPY: palliative systemic chemotherapy with carboplatin for AUC of 5, Alimta 500 Mg/M2 and Avastin 15 Mg/KG every 3 weeks.  The patient is not a great candidate for immunotherapy because of her history of active systemic lupus erythematosus.  Starting from cycle #7 the patient is on maintenance treatment with Alimta and Avastin every 3 weeks.  She is status post 17 cycles.  INTERVAL HISTORY: Kirsten Williams 50 y.o. female returns to the clinic today for follow-up visit.  The patient is feeling fine today with no concerning complaints.  She denied having any chest pain, shortness of breath, cough or hemoptysis.  She has no nausea, vomiting, diarrhea or constipation.  She denied having any significant weight loss or night sweats.  She has no fever or chills.  She continues to tolerate her treatment with maintenance Alimta and Avastin fairly well.  She is here today for evaluation before starting cycle #18.  MEDICAL HISTORY: Past Medical History:  Diagnosis Date   Adenocarcinoma, lung, right ([facility]) 05/03/2021   Kirsten Williams is a 51 y.o. female with a history of lung nodules dating back to 2019 who is referred in consultation with Cyndi Bender, PA-C for assessment and management. She is a former smoker having quit in 2016. To date, nodules have been stable as  well as likely adrenal adenomas. She missed her screening CT last year and imaging was ordered last month. CT chest from 02-16-2021 reveals pro   Anxiety    GERD (gastroesophageal reflux disease)    SLE (systemic lupus erythematosus) ([facility]) 05/03/2021   SLE (systemic lupus erythematosus) ([facility]) 05/03/2021    ALLERGIES:  is allergic to clindamycin/lincomycin and carboplatin.  MEDICATIONS:  Current Outpatient Medications  Medication Sig Dispense Refill   acetaminophen (TYLENOL) 325 MG tablet Take 975 mg by mouth every 6 (six) hours as needed for moderate pain or headache.     B Complex Vitamins (B COMPLEX 50 PO) Take by mouth.     Calcium Carbonate-Vit D-Min (CALCIUM 1200 PO) Take 1 capsule by mouth daily.     cholecalciferol (VITAMIN D3) 25 MCG (1000 UNIT) tablet Take 2,000 Units by mouth daily.     cyclobenzaprine (FLEXERIL) 10 MG tablet Take 10 mg by mouth at bedtime as needed for muscle spasms.     dexamethasone (DECADRON) 4 MG tablet 4 mg p.o. twice daily the day before, day of and day after the chemotherapy every 3 weeks. 40 tablet 2   dicyclomine (BENTYL) 10 MG capsule Take 10 mg by mouth every 6 (six) hours as needed. (Patient not taking: Reported on 02/28/2022)     diphenhydrAMINE (BENADRYL) 25 MG tablet Take 25 mg by mouth daily as needed for allergies.     famotidine (PEPCID) 20 MG tablet Take 20 mg by mouth at bedtime.     folic acid (FOLVITE) 1 MG tablet TAKE 1 TABLET(1 MG) BY MOUTH DAILY 30  tablet 4   gabapentin (NEURONTIN) 100 MG capsule Take 100 mg by mouth 3 (three) times daily.     hydrocortisone 1 % lotion Apply 1 application topically 2 (two) times daily. 118 mL 0   hydroxychloroquine (PLAQUENIL) 200 MG tablet Take 200 mg by mouth 2 (two) times daily.     Multiple Vitamins-Minerals (MULTI ADULT GUMMIES) CHEW Chew 2 tablets by mouth daily.     omeprazole (PRILOSEC) 40 MG capsule Take 40 mg by mouth daily as needed (acid reflux).     oxyCODONE-acetaminophen (PERCOCET/ROXICET)  5-325 MG tablet Take 1 tablet by mouth every 8 (eight) hours as needed for severe pain. 30 tablet 0   Probiotic Product (PROBIOTIC-10 PO) Take by mouth.     prochlorperazine (COMPAZINE) 10 MG tablet Take 1 tablet (10 mg total) by mouth every 6 (six) hours as needed. (Patient not taking: Reported on 02/28/2022) 30 tablet 2   rizatriptan (MAXALT-MLT) 10 MG disintegrating tablet Take 10 mg by mouth 2 (two) times daily as needed.     tetrahydrozoline 0.05 % ophthalmic solution Place 1 drop into both eyes once. Systane Eye Drops once daily both eyes     triamcinolone ointment (KENALOG) 0.5 % Apply topically 3 (three) times daily.     No current facility-administered medications for this visit.    SURGICAL HISTORY:  Past Surgical History:  Procedure Laterality Date   ABDOMINAL HYSTERECTOMY     BRONCHIAL BIOPSY  04/24/2021   Procedure: BRONCHIAL BIOPSIES;  Surgeon: Collene Gobble, MD;  Location: [facility] ENDOSCOPY;  Service: Pulmonary;;   BRONCHIAL BRUSHINGS  04/24/2021   Procedure: BRONCHIAL BRUSHINGS;  Surgeon: Collene Gobble, MD;  Location: [facility] ENDOSCOPY;  Service: Pulmonary;;   BRONCHIAL NEEDLE ASPIRATION BIOPSY  04/24/2021   Procedure: BRONCHIAL NEEDLE ASPIRATION BIOPSIES;  Surgeon: Collene Gobble, MD;  Location: [facility];  Service: Pulmonary;;   BRONCHIAL WASHINGS  04/24/2021   Procedure: BRONCHIAL WASHINGS;  Surgeon: Collene Gobble, MD;  Location: [facility];  Service: Pulmonary;;   CESAREAN SECTION  11/09/1990   DILATION AND CURETTAGE OF UTERUS Bilateral 09/09/1996   FIDUCIAL MARKER PLACEMENT  04/24/2021   Procedure: FIDUCIAL MARKER PLACEMENT;  Surgeon: Collene Gobble, MD;  Location: [facility] ENDOSCOPY;  Service: Pulmonary;;   TONSILLECTOMY  12/10/1977   VIDEO BRONCHOSCOPY WITH ENDOBRONCHIAL NAVIGATION Bilateral 04/24/2021   Procedure: VIDEO BRONCHOSCOPY WITH ENDOBRONCHIAL NAVIGATION;  Surgeon: Collene Gobble, MD;  Location: [facility] ENDOSCOPY;  Service: Pulmonary;  Laterality: Bilateral;    REVIEW  OF SYSTEMS:  A comprehensive review of systems was negative.   PHYSICAL EXAMINATION: General appearance: alert, cooperative, and no distress Head: Normocephalic, without obvious abnormality, atraumatic Neck: no adenopathy, no JVD, supple, symmetrical, trachea midline, and thyroid not enlarged, symmetric, no tenderness/mass/nodules Lymph nodes: Cervical, supraclavicular, and axillary nodes normal. Resp: clear to auscultation bilaterally Back: symmetric, no curvature. ROM normal. No CVA tenderness. Cardio: regular rate and rhythm, S1, S2 normal, no murmur, click, rub or gallop GI: soft, non-tender; bowel sounds normal; no masses,  no organomegaly Extremities: extremities normal, atraumatic, no cyanosis or edema  ECOG PERFORMANCE STATUS: 1 - Symptomatic but completely ambulatory  Blood pressure (!) 146/81, pulse 86, temperature 97.8 F (36.6 C), temperature source Oral, resp. rate 15, weight 120 lb (54.4 kg), SpO2 96 %.  LABORATORY DATA: Lab Results  Component Value Date   WBC 7.3 06/20/2022   HGB 13.9 06/20/2022   HCT 41.0 06/20/2022   MCV 96.5 06/20/2022   PLT 231 06/20/2022  Chemistry      Component Value Date/Time   NA 136 06/01/2022 0752   NA 140 03/13/2021 0000   K 4.4 06/01/2022 0752   CL 106 06/01/2022 0752   CO2 21 (L) 06/01/2022 0752   BUN 15 06/01/2022 0752   BUN 10 03/13/2021 0000   CREATININE 0.73 06/01/2022 0752   GLU 107 03/13/2021 0000      Component Value Date/Time   CALCIUM 10.0 06/01/2022 0752   ALKPHOS 101 06/01/2022 0752   AST 26 06/01/2022 0752   ALT 27 06/01/2022 0752   BILITOT 0.3 06/01/2022 0752       RADIOGRAPHIC STUDIES: CT Chest W Contrast  Result Date: 05/29/2022 CLINICAL DATA:  Metastatic lung cancer * Tracking Code: BO * EXAM: CT CHEST, ABDOMEN, AND PELVIS WITH CONTRAST TECHNIQUE: Multidetector CT imaging of the chest, abdomen and pelvis was performed following the standard protocol during bolus administration of intravenous  contrast. RADIATION DOSE REDUCTION: This exam was performed according to the departmental dose-optimization program which includes automated exposure control, adjustment of the mA and/or kV according to patient size and/or use of iterative reconstruction technique. CONTRAST:  59mL OMNIPAQUE IOHEXOL 300 MG/ML SOLN, additional oral enteric contrast COMPARISON:  02/23/2022 FINDINGS: CT CHEST FINDINGS Cardiovascular: Aortic atherosclerosis. Normal heart size. No pericardial effusion. Mediastinum/Nodes: Unchanged prominent mediastinal lymph nodes, largest pretracheal node measuring 1.2 x 1.1 cm (series 2, image 21). No overtly enlarged mediastinal, hilar, or axillary lymph nodes. Thyroid gland, trachea, and esophagus demonstrate no significant findings. Lungs/Pleura: Multiple ground-glass and subsolid pulmonary nodules are again seen throughout the lungs, unchanged, for example a subsolid nodule in the posterior right apex measuring 1.2 x 1.0 cm (series 4, image 42), a subsolid nodule in the superior segment right lower lobe, abutting and tenting the adjacent major fissure measuring 1.2 x 1.2 cm (series 4, image 49), and a ground-glass nodule in the peripheral left upper lobe measuring 1.2 x 0.9 cm (series 4, image 49). Fiducial markers in the peripheral left upper lobe (series 4, image 31) and in the superior segment right lower lobe (series 4, image 54). Background of very fine centrilobular nodularity, most concentrated in the lung apices. Minimal paraseptal emphysema and diffuse bilateral bronchial wall thickening. No pleural effusion or pneumothorax. Musculoskeletal: No chest wall mass or suspicious osseous lesions identified. CT ABDOMEN PELVIS FINDINGS Hepatobiliary: No solid liver abnormality is seen. No gallstones, gallbladder wall thickening, or biliary dilatation. Pancreas: Unremarkable. No pancreatic ductal dilatation or surrounding inflammatory changes. Spleen: Normal in size without significant abnormality.  Adrenals/Urinary Tract: Stable, benign bilateral adrenal adenomata, not previously FDG avid. Kidneys are normal, without renal calculi, solid lesion, or hydronephrosis. Bladder is unremarkable. Stomach/Bowel: Stomach is within normal limits. Appendix is normal. No evidence of bowel wall thickening, distention, or inflammatory changes. Vascular/Lymphatic: Aortic atherosclerosis. No enlarged abdominal or pelvic lymph nodes. Reproductive: Status post hysterectomy. Other: No abdominal wall hernia or abnormality. No ascites. Musculoskeletal: No acute osseous findings. IMPRESSION: 1. Multiple ground-glass and subsolid pulmonary nodules are unchanged, and remain suspicious for multifocal indolent adenocarcinoma. 2. Unchanged prominent mediastinal lymph nodes, previously FDG avid and suspicious for nodal metastatic disease. 3. No evidence of lymphadenopathy or metastatic disease in the abdomen or pelvis. 4. Emphysema, diffuse bilateral bronchial wall thickening, and background of very fine centrilobular nodularity, most concentrated in the lung apices, consistent with smoking-related respiratory bronchiolitis. Aortic Atherosclerosis (ICD10-I70.0) and Emphysema (ICD10-J43.9). Electronically Signed   By: Delanna Ahmadi M.D.   On: 05/29/2022 08:57   CT Abdomen Pelvis W  Contrast  Result Date: 05/29/2022 CLINICAL DATA:  Metastatic lung cancer * Tracking Code: BO * EXAM: CT CHEST, ABDOMEN, AND PELVIS WITH CONTRAST TECHNIQUE: Multidetector CT imaging of the chest, abdomen and pelvis was performed following the standard protocol during bolus administration of intravenous contrast. RADIATION DOSE REDUCTION: This exam was performed according to the departmental dose-optimization program which includes automated exposure control, adjustment of the mA and/or kV according to patient size and/or use of iterative reconstruction technique. CONTRAST:  19mL OMNIPAQUE IOHEXOL 300 MG/ML SOLN, additional oral enteric contrast COMPARISON:   02/23/2022 FINDINGS: CT CHEST FINDINGS Cardiovascular: Aortic atherosclerosis. Normal heart size. No pericardial effusion. Mediastinum/Nodes: Unchanged prominent mediastinal lymph nodes, largest pretracheal node measuring 1.2 x 1.1 cm (series 2, image 21). No overtly enlarged mediastinal, hilar, or axillary lymph nodes. Thyroid gland, trachea, and esophagus demonstrate no significant findings. Lungs/Pleura: Multiple ground-glass and subsolid pulmonary nodules are again seen throughout the lungs, unchanged, for example a subsolid nodule in the posterior right apex measuring 1.2 x 1.0 cm (series 4, image 42), a subsolid nodule in the superior segment right lower lobe, abutting and tenting the adjacent major fissure measuring 1.2 x 1.2 cm (series 4, image 49), and a ground-glass nodule in the peripheral left upper lobe measuring 1.2 x 0.9 cm (series 4, image 49). Fiducial markers in the peripheral left upper lobe (series 4, image 31) and in the superior segment right lower lobe (series 4, image 54). Background of very fine centrilobular nodularity, most concentrated in the lung apices. Minimal paraseptal emphysema and diffuse bilateral bronchial wall thickening. No pleural effusion or pneumothorax. Musculoskeletal: No chest wall mass or suspicious osseous lesions identified. CT ABDOMEN PELVIS FINDINGS Hepatobiliary: No solid liver abnormality is seen. No gallstones, gallbladder wall thickening, or biliary dilatation. Pancreas: Unremarkable. No pancreatic ductal dilatation or surrounding inflammatory changes. Spleen: Normal in size without significant abnormality. Adrenals/Urinary Tract: Stable, benign bilateral adrenal adenomata, not previously FDG avid. Kidneys are normal, without renal calculi, solid lesion, or hydronephrosis. Bladder is unremarkable. Stomach/Bowel: Stomach is within normal limits. Appendix is normal. No evidence of bowel wall thickening, distention, or inflammatory changes. Vascular/Lymphatic:  Aortic atherosclerosis. No enlarged abdominal or pelvic lymph nodes. Reproductive: Status post hysterectomy. Other: No abdominal wall hernia or abnormality. No ascites. Musculoskeletal: No acute osseous findings. IMPRESSION: 1. Multiple ground-glass and subsolid pulmonary nodules are unchanged, and remain suspicious for multifocal indolent adenocarcinoma. 2. Unchanged prominent mediastinal lymph nodes, previously FDG avid and suspicious for nodal metastatic disease. 3. No evidence of lymphadenopathy or metastatic disease in the abdomen or pelvis. 4. Emphysema, diffuse bilateral bronchial wall thickening, and background of very fine centrilobular nodularity, most concentrated in the lung apices, consistent with smoking-related respiratory bronchiolitis. Aortic Atherosclerosis (ICD10-I70.0) and Emphysema (ICD10-J43.9). Electronically Signed   By: Delanna Ahmadi M.D.   On: 05/29/2022 08:57    ASSESSMENT AND PLAN:  This is a very pleasant 51 years old white female recently diagnosed with a stage IV non-small cell lung cancer, adenocarcinoma with no actionable mutation diagnosed in May 2022.  The patient has also history of systemic lupus erythematosus and she is not a candidate for immunotherapy. She is currently undergoing systemic chemotherapy with carboplatin for AUC of 5, Alimta 500 Mg/M2 and Avastin 15 Mg/KG every 3 weeks status post 17 cycles.  Starting from cycle #7 the patient is on maintenance treatment with Alimta and Avastin every 3 weeks. The patient has been tolerating her treatment fairly well with no significant adverse effects. I recommended for her to proceed with  cycle #18 today as planned. She will come back for follow-up visit in 3 weeks for evaluation before the next cycle of her treatment. The patient was advised to call immediately if she has any other concerning symptoms in the interval.  The patient voices understanding of current disease status and treatment options and is in agreement  with the current care plan.  All questions were answered. The patient knows to call the clinic with any problems, questions or concerns. We can certainly see the patient much sooner if necessary.  Disclaimer: This note was dictated with voice recognition software. Similar sounding words can inadvertently be transcribed and may not be corrected upon review.

## 2022-07-02 ENCOUNTER — Other Ambulatory Visit: Payer: Self-pay

## 2022-07-11 ENCOUNTER — Inpatient Hospital Stay (HOSPITAL_BASED_OUTPATIENT_CLINIC_OR_DEPARTMENT_OTHER): Payer: Commercial Managed Care - PPO | Admitting: Internal Medicine

## 2022-07-11 ENCOUNTER — Inpatient Hospital Stay: Payer: Commercial Managed Care - PPO | Attending: Hematology and Oncology

## 2022-07-11 ENCOUNTER — Other Ambulatory Visit: Payer: Self-pay

## 2022-07-11 ENCOUNTER — Other Ambulatory Visit: Payer: Self-pay | Admitting: Internal Medicine

## 2022-07-11 ENCOUNTER — Inpatient Hospital Stay: Payer: Commercial Managed Care - PPO

## 2022-07-11 VITALS — BP 156/84 | HR 100 | Temp 97.8°F | Resp 17 | Wt 121.1 lb

## 2022-07-11 DIAGNOSIS — C3491 Malignant neoplasm of unspecified part of right bronchus or lung: Secondary | ICD-10-CM

## 2022-07-11 DIAGNOSIS — Z5112 Encounter for antineoplastic immunotherapy: Secondary | ICD-10-CM | POA: Diagnosis present

## 2022-07-11 DIAGNOSIS — Z5111 Encounter for antineoplastic chemotherapy: Secondary | ICD-10-CM | POA: Diagnosis present

## 2022-07-11 DIAGNOSIS — C3411 Malignant neoplasm of upper lobe, right bronchus or lung: Secondary | ICD-10-CM | POA: Insufficient documentation

## 2022-07-11 DIAGNOSIS — G893 Neoplasm related pain (acute) (chronic): Secondary | ICD-10-CM

## 2022-07-11 LAB — CMP (CANCER CENTER ONLY)
ALT: 29 U/L (ref 0–44)
AST: 26 U/L (ref 15–41)
Albumin: 4.5 g/dL (ref 3.5–5.0)
Alkaline Phosphatase: 99 U/L (ref 38–126)
Anion gap: 11 (ref 5–15)
BUN: 7 mg/dL (ref 6–20)
CO2: 24 mmol/L (ref 22–32)
Calcium: 9.9 mg/dL (ref 8.9–10.3)
Chloride: 104 mmol/L (ref 98–111)
Creatinine: 0.76 mg/dL (ref 0.44–1.00)
GFR, Estimated: >60 mL/min (ref >60)
Glucose, Bld: 183 mg/dL — ABNORMAL HIGH (ref 70–99)
Potassium: 3.6 mmol/L (ref 3.5–5.1)
Sodium: 139 mmol/L (ref 135–145)
Total Bilirubin: 0.3 mg/dL (ref 0.3–1.2)
Total Protein: 7.9 g/dL (ref 6.5–8.1)

## 2022-07-11 LAB — CBC WITH DIFFERENTIAL (CANCER CENTER ONLY)
Abs Immature Granulocytes: 0.02 10*3/uL (ref 0.00–0.07)
Basophils Absolute: 0 10*3/uL (ref 0.0–0.1)
Basophils Relative: 0 %
Eosinophils Absolute: 0 10*3/uL (ref 0.0–0.5)
Eosinophils Relative: 0 %
HCT: 45.7 % (ref 36.0–46.0)
Hemoglobin: 15.1 g/dL — ABNORMAL HIGH (ref 12.0–15.0)
Immature Granulocytes: 0 %
Lymphocytes Relative: 8 %
Lymphs Abs: 0.6 10*3/uL — ABNORMAL LOW (ref 0.7–4.0)
MCH: 32.1 pg (ref 26.0–34.0)
MCHC: 33 g/dL (ref 30.0–36.0)
MCV: 97.2 fL (ref 80.0–100.0)
Monocytes Absolute: 0.1 10*3/uL (ref 0.1–1.0)
Monocytes Relative: 1 %
Neutro Abs: 7.3 10*3/uL (ref 1.7–7.7)
Neutrophils Relative %: 91 %
Platelet Count: 224 10*3/uL (ref 150–400)
RBC: 4.7 MIL/uL (ref 3.87–5.11)
RDW: 14.6 % (ref 11.5–15.5)
WBC Count: 8 10*3/uL (ref 4.0–10.5)
nRBC: 0 % (ref 0.0–0.2)

## 2022-07-11 LAB — TOTAL PROTEIN, URINE DIPSTICK: Protein, ur: NEGATIVE mg/dL

## 2022-07-11 MED ORDER — SODIUM CHLORIDE 0.9 % IV SOLN
15.0000 mg/kg | Freq: Once | INTRAVENOUS | Status: AC
  Administered 2022-07-11: 800 mg via INTRAVENOUS
  Filled 2022-07-11: qty 32

## 2022-07-11 MED ORDER — SODIUM CHLORIDE 0.9 % IV SOLN: Freq: Once | INTRAVENOUS | Status: AC

## 2022-07-11 MED ORDER — OXYCODONE-ACETAMINOPHEN 5-325 MG PO TABS: 1.0000 | ORAL_TABLET | Freq: Three times a day (TID) | ORAL | 0 refills | Status: DC | PRN

## 2022-07-11 MED ORDER — PROCHLORPERAZINE MALEATE 10 MG PO TABS
10.0000 mg | ORAL_TABLET | Freq: Once | ORAL | Status: AC
  Administered 2022-07-11: 10 mg via ORAL
  Filled 2022-07-11: qty 1

## 2022-07-11 MED ORDER — SODIUM CHLORIDE 0.9 % IV SOLN
500.0000 mg/m2 | Freq: Once | INTRAVENOUS | Status: AC
  Administered 2022-07-11: 800 mg via INTRAVENOUS
  Filled 2022-07-11: qty 20

## 2022-07-11 NOTE — Progress Notes (Signed)
[facility] [phone]   [phone]  OFFICE PROGRESS NOTE  Kirsten Bender, PA-C [place] [address]  DIAGNOSIS:  Stage IV (T4, N2, M1 a) non-small cell lung cancer, adenocarcinoma presented with multifocal disease involving the right upper lobe, right lower lobe as well as suspicious lower paratracheal lymphadenopathy and groundglass nodules in the left lung diagnosed in May 2022. The patient had molecular studies performed by [facility] and that showed no detectable mutation but this is likely secondary to low-level circulating free tumor DNA.  PRIOR THERAPY: None.  CURRENT THERAPY: palliative systemic chemotherapy with carboplatin for AUC of 5, Alimta 500 Mg/M2 and Avastin 15 Mg/KG every 3 weeks.  The patient is not a great candidate for immunotherapy because of her history of active systemic lupus erythematosus.  Starting from cycle #7 the patient is on maintenance treatment with Alimta and Avastin every 3 weeks.  She is status post 18 cycles.  INTERVAL HISTORY: Kirsten Williams 51 y.o. female returns to the clinic today for follow-up visit.  The patient has no complaints today except for mild fatigue.  She denied having any current chest pain except for occasional pain on the left side of the chest under her breast but no shortness of breath, cough or hemoptysis.  She denied having any fever or chills.  She has no nausea, vomiting, diarrhea or constipation.  She has no recent weight loss or night sweats.  She has no headache or visual changes.  She is here today for evaluation before starting cycle #19 of her treatment.   MEDICAL HISTORY: Past Medical History:  Diagnosis Date   Adenocarcinoma, lung, right (Kirsten Williams) 05/03/2021   Kirsten Williams is a 51 y.o. female with a history of lung nodules dating back to 2019 who is referred in consultation with Kirsten Bender, PA-C for assessment and management. She is a former smoker having  quit in 2016. 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 She missed her screening CT last year and imaging was ordered last month. CT chest from 02-16-2021 reveals pro   Anxiety    GERD (gastroesophageal reflux disease)    SLE (systemic lupus erythematosus) ([place]) 05/03/2021   SLE (systemic lupus erythematosus) ([place]) 05/03/2021    ALLERGIES:  is allergic to clindamycin/lincomycin and carboplatin.  MEDICATIONS:  Current Outpatient Medications  Medication Sig Dispense Refill   acetaminophen (TYLENOL) 325 MG tablet Take 975 mg by mouth every 6 (six) hours as needed for moderate pain or headache.     B Complex Vitamins (B COMPLEX 50 PO) Take by mouth.     Calcium Carbonate-Vit D-Min (CALCIUM 1200 PO) Take 1 capsule by mouth daily.     cholecalciferol (VITAMIN D3) 25 MCG (1000 UNIT) tablet Take 2,000 Units by mouth daily.     cyclobenzaprine (FLEXERIL) 10 MG tablet Take 10 mg by mouth at bedtime as needed for muscle spasms.     dexamethasone (DECADRON) 4 MG tablet 4 mg p.o. twice daily the day before, day of and day after the chemotherapy every 3 weeks. 40 tablet 2   dicyclomine (BENTYL) 10 MG capsule Take 10 mg by mouth every 6 (six) hours as needed. (Patient not taking: Reported on 02/28/2022)     diphenhydrAMINE (BENADRYL) 25 MG tablet Take 25 mg by mouth daily as needed for allergies.     famotidine (PEPCID) 20 MG tablet Take 20 mg by mouth at bedtime.  folic acid (FOLVITE) 1 MG tablet TAKE 1 TABLET(1 MG) BY MOUTH DAILY 30 tablet 4   gabapentin (NEURONTIN) 100 MG capsule Take 100 mg by mouth 3 (three) times daily.     hydrocortisone 1 % lotion Apply 1 application topically 2 (two) times daily. 118 mL 0   hydroxychloroquine (PLAQUENIL) 200 MG tablet Take 200 mg by mouth 2 (two) times daily.     Multiple Vitamins-Minerals (MULTI ADULT GUMMIES) CHEW Chew 2 tablets by mouth daily.     omeprazole (PRILOSEC) 40 MG capsule Take 40 mg by mouth daily as needed (acid reflux).      oxyCODONE-acetaminophen (PERCOCET/ROXICET) 5-325 MG tablet Take 1 tablet by mouth every 8 (eight) hours as needed for severe pain. 30 tablet 0   Probiotic Product (PROBIOTIC-10 PO) Take by mouth.     prochlorperazine (COMPAZINE) 10 MG tablet Take 1 tablet (10 mg total) by mouth every 6 (six) hours as needed. (Patient not taking: Reported on 02/28/2022) 30 tablet 2   rizatriptan (MAXALT-MLT) 10 MG disintegrating tablet Take 10 mg by mouth 2 (two) times daily as needed.     tetrahydrozoline 0.05 % ophthalmic solution Place 1 drop into both eyes once. Systane Eye Drops once daily both eyes     triamcinolone ointment (KENALOG) 0.5 % Apply topically 3 (three) times daily.     No current facility-administered medications for this visit.    SURGICAL HISTORY:  Past Surgical History:  Procedure Laterality Date   ABDOMINAL HYSTERECTOMY     BRONCHIAL BIOPSY  04/24/2021   Procedure: BRONCHIAL BIOPSIES;  Surgeon: Collene Gobble, MD;  Location: [facility] ENDOSCOPY;  Service: Pulmonary;;   BRONCHIAL BRUSHINGS  04/24/2021   Procedure: BRONCHIAL BRUSHINGS;  Surgeon: Collene Gobble, MD;  Location: [facility] ENDOSCOPY;  Service: Pulmonary;;   BRONCHIAL NEEDLE ASPIRATION BIOPSY  04/24/2021   Procedure: BRONCHIAL NEEDLE ASPIRATION BIOPSIES;  Surgeon: Collene Gobble, MD;  Location: [facility];  Service: Pulmonary;;   BRONCHIAL WASHINGS  04/24/2021   Procedure: BRONCHIAL WASHINGS;  Surgeon: Collene Gobble, MD;  Location: [facility];  Service: Pulmonary;;   CESAREAN SECTION  11/09/1990   DILATION AND CURETTAGE OF UTERUS Bilateral 09/09/1996   FIDUCIAL MARKER PLACEMENT  04/24/2021   Procedure: FIDUCIAL MARKER PLACEMENT;  Surgeon: Collene Gobble, MD;  Location: [facility] ENDOSCOPY;  Service: Pulmonary;;   TONSILLECTOMY  12/10/1977   VIDEO BRONCHOSCOPY WITH ENDOBRONCHIAL NAVIGATION Bilateral 04/24/2021   Procedure: VIDEO BRONCHOSCOPY WITH ENDOBRONCHIAL NAVIGATION;  Surgeon: Collene Gobble, MD;  Location: [facility] ENDOSCOPY;  Service:  Pulmonary;  Laterality: Bilateral;    REVIEW OF SYSTEMS:  A comprehensive review of systems was negative except for: Constitutional: positive for fatigue   PHYSICAL EXAMINATION: General appearance: alert, cooperative, and no distress Head: Normocephalic, without obvious abnormality, atraumatic Neck: no adenopathy, no JVD, supple, symmetrical, trachea midline, and thyroid not enlarged, symmetric, no tenderness/mass/nodules Lymph nodes: Cervical, supraclavicular, and axillary nodes normal. Resp: clear to auscultation bilaterally Back: symmetric, no curvature. ROM normal. No CVA tenderness. Cardio: regular rate and rhythm, S1, S2 normal, no murmur, click, rub or gallop GI: soft, non-tender; bowel sounds normal; no masses,  no organomegaly Extremities: extremities normal, atraumatic, no cyanosis or edema  ECOG PERFORMANCE STATUS: 1 - Symptomatic but completely ambulatory  Blood pressure (!) 156/84, pulse 100, temperature 97.8 F (36.6 C), temperature source Oral, resp. rate 17, weight 121 lb 2 oz (54.9 kg), SpO2 98 %.  LABORATORY DATA: Lab Results  Component Value Date   WBC 7.3 06/20/2022   HGB  13.9 06/20/2022   HCT 41.0 06/20/2022   MCV 96.5 06/20/2022   PLT 231 06/20/2022      Chemistry      Component Value Date/Time   NA 138 06/20/2022 0746   NA 140 03/13/2021 0000   K 4.1 06/20/2022 0746   CL 105 06/20/2022 0746   CO2 24 06/20/2022 0746   BUN 12 06/20/2022 0746   BUN 10 03/13/2021 0000   CREATININE 0.80 06/20/2022 0746   GLU 107 03/13/2021 0000      Component Value Date/Time   CALCIUM 10.1 06/20/2022 0746   ALKPHOS 86 06/20/2022 0746   AST 29 06/20/2022 0746   ALT 34 06/20/2022 0746   BILITOT 0.4 06/20/2022 0746       RADIOGRAPHIC STUDIES: No results found.  ASSESSMENT AND PLAN:  This is a very pleasant 51 years old white female recently diagnosed with a stage IV non-small cell lung cancer, adenocarcinoma with no actionable mutation diagnosed in May 2022.   The patient has also history of systemic lupus erythematosus and she is not a candidate for immunotherapy. She is currently undergoing systemic chemotherapy with carboplatin for AUC of 5, Alimta 500 Mg/M2 and Avastin 15 Mg/KG every 3 weeks status post 18 cycles.  Starting from cycle #7 the patient is on maintenance treatment with Alimta and Avastin every 3 weeks. The patient has been tolerating her treatment with maintenance Alimta and Avastin fairly well. I recommended for her to proceed with cycle #19 today as planned. I will see her back for follow-up visit in 3 weeks for evaluation before the next cycle of her treatment. I will consider her for repeat imaging studies after the next cycle of her treatment. She was advised to call immediately if she has any concerning symptoms in the interval. The patient voices understanding of current disease status and treatment options and is in agreement with the current care plan.  All questions were answered. The patient knows to call the clinic with any problems, questions or concerns. We can certainly see the patient much sooner if necessary.  Disclaimer: This note was dictated with voice recognition software. Similar sounding words can inadvertently be transcribed and may not be corrected upon review.

## 2022-07-11 NOTE — Patient Instructions (Signed)
[facility]  Discharge Instructions: Thank you for choosing [facility] to provide your oncology and hematology care.   If you have a lab appointment with the [facility], please go directly to the [facility] and check in at the registration area.   Wear comfortable clothing and clothing appropriate for easy access to any Portacath or PICC line.   We strive to give you quality time with your provider. You may need to reschedule your appointment if you arrive late (15 or more minutes).  Arriving late affects you and other patients whose appointments are after yours.  Also, if you miss three or more appointments without notifying the office, you may be dismissed from the clinic at the provider's discretion.      For prescription refill requests, have your pharmacy contact our office and allow 72 hours for refills to be completed.    Today you received the following chemotherapy and/or immunotherapy agents: bevacizumab and pemetrexed       To help prevent nausea and vomiting after your treatment, we encourage you to take your nausea medication as directed.  BELOW ARE SYMPTOMS THAT SHOULD BE REPORTED IMMEDIATELY: *FEVER GREATER THAN 100.4 F (38 C) OR HIGHER *CHILLS OR SWEATING *NAUSEA AND VOMITING THAT IS NOT CONTROLLED WITH YOUR NAUSEA MEDICATION *UNUSUAL SHORTNESS OF BREATH *UNUSUAL BRUISING OR BLEEDING *URINARY PROBLEMS (pain or burning when urinating, or frequent urination) *BOWEL PROBLEMS (unusual diarrhea, constipation, pain near the anus) TENDERNESS IN MOUTH AND THROAT WITH OR WITHOUT PRESENCE OF ULCERS (sore throat, sores in mouth, or a toothache) UNUSUAL RASH, SWELLING OR PAIN  UNUSUAL VAGINAL DISCHARGE OR ITCHING   Items with * indicate a potential emergency and should be followed up as soon as possible or go to the Emergency Department if any problems should occur.  Please show the CHEMOTHERAPY ALERT CARD or IMMUNOTHERAPY ALERT  CARD at check-in to the Emergency Department and triage nurse.  Should you have questions after your visit or need to cancel or reschedule your appointment, please contact [facility]  Dept: [phone]  and follow the prompts.  Office hours are 8:00 a.m. to 4:30 p.m. Monday - Friday. Please note that voicemails left after 4:00 p.m. may not be returned until the following business day.  We are closed weekends and major holidays. You have access to a nurse at all times for urgent questions. Please call the main number to the clinic Dept: [phone] and follow the prompts.   For any non-urgent questions, you may also contact your provider using MyChart. We now offer e-Visits for anyone 55 and older to request care online for non-urgent symptoms. For details visit [email or web address].   Also download the MyChart app! Go to the app store, search "MyChart", open the app, select [facility], and log in with your MyChart username and password.  Masks are optional in the cancer centers. If you would like for your care team to wear a mask while they are taking care of you, please let them know. For doctor visits, patients may have with them one support person who is at least 52 years old. At this time, visitors are not allowed in the infusion area.

## 2022-07-12 ENCOUNTER — Other Ambulatory Visit: Payer: Self-pay

## 2022-07-13 ENCOUNTER — Telehealth: Payer: Self-pay | Admitting: Physician Assistant

## 2022-07-13 ENCOUNTER — Telehealth: Payer: Self-pay

## 2022-07-13 NOTE — Telephone Encounter (Signed)
Pt called stating her pharmacy, [facility], advised her rx of Oxycodone needs a PA.  Kirsten Link, RN completed the PA and I have called pts pharmacy to advise of this. They advised they were still having issues getting the request to complete stating a 2nd PA for "excessive use" needed to be done. I again contacted Kirsten Link, RN who advised the completed PA included this. She has also contacted the pts insurance company and they advised nothing else was needed and completed a sample claim with no issues. They advised the pharmacy need to back all the way out the system and start fresh but if the pharmacy continues to have issues, they will need to call them on the pharmacy line to be walked through the process.  I have called pts pharmacy back and advised of this information. They advised they are still receiving an error and will contact pts insurance.  I have called the pt back and advised of all of this so she is aware given she is entering the weekend. Pt expressed understanding of this information.

## 2022-07-13 NOTE — Telephone Encounter (Signed)
Scheduled per 08/02 los, patient has been called and notified of upcoming appointments.

## 2022-07-26 NOTE — Progress Notes (Signed)
[facility] OFFICE PROGRESS NOTE  Cyndi Bender, PA-C [place] [address]  DIAGNOSIS:  Stage IV (T4, N2, M1 a) non-small cell lung cancer, adenocarcinoma presented with multifocal disease involving the right upper lobe, right lower lobe as well as suspicious lower paratracheal lymphadenopathy and groundglass nodules in the left lung diagnosed in May 2022. The patient had molecular studies performed by [facility] and that showed no detectable mutation but this is likely secondary to low-level circulating free tumor DNA.  PRIOR THERAPY: None  CURRENT THERAPY:  Palliative systemic chemotherapy with carboplatin for AUC of 5, Alimta 500 Mg/M2 and Avastin 15 Mg/KG every 3 weeks.  The patient is not a great candidate for immunotherapy in the event that she has a genetic mutation.  She is status post 19 cycles. Starting from cycle #6, the patient will start maintenance Alimta and Avastin.  INTERVAL HISTORY: Kirsten Williams 51 y.o. female returns to the clinic today for a follow-up visit.   The patient is feeling fairly well today without any concerning complaints except she did devlope a slight rash on her shoulders and chest recently. She reports this is her "chemo rash". She sees dermatology who gave her a cream which helps with the itch. She is unsure if this is triamcinolone cream or not. Otherwise she feels well today.  She is tolerating her treatment fairly well except she does sometimes have fatigue the week following treatment. She is very motivated and is trying to be active and eat healthy. She is trying to take vitamins. She has been going to the gym with her daughters. She denies any fever or chills.  She has occasional night sweats which is not new for her she has these intermittently. She reports a fine appetite. She denies any significant shortness of breath unless with strenuous activity. Denies  or chest pain.  A few days following treatment she sometimes  has a tightness around her diaphragm. Denies any abnormal bleeding or bruising.  She is prone to headaches due to history of migraines but did not report any recent headaches. Denies any changes with her bowel habits. She is seeing neurology on 08/14/22 for the tingling in her right foot. She is here for evaluation and to review her scan results before starting cycle #20.     MEDICAL HISTORY: Past Medical History:  Diagnosis Date   Adenocarcinoma, lung, right ([facility]) 05/03/2021   Kirsten Williams is a 51 y.o. female with a history of lung nodules dating back to 2019 who is referred in consultation with Cyndi Bender, PA-C for assessment and management. She is a former smoker having quit in 2016. 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 She missed her screening CT last year and imaging was ordered last month. CT chest from 02-16-2021 reveals pro   Anxiety    GERD (gastroesophageal reflux disease)    SLE (systemic lupus erythematosus) ([facility]) 05/03/2021   SLE (systemic lupus erythematosus) ([place]) 05/03/2021    ALLERGIES:  is allergic to clindamycin/lincomycin and carboplatin.  MEDICATIONS:  Current Outpatient Medications  Medication Sig Dispense Refill   B Complex Vitamins (B COMPLEX 50 PO) Take by mouth.     Calcium Carbonate-Vit D-Min (CALCIUM 1200 PO) Take 1 capsule by mouth daily.     cholecalciferol (VITAMIN D3) 25 MCG (1000 UNIT) tablet Take 2,000 Units by mouth daily.     cyclobenzaprine (FLEXERIL) 10 MG tablet Take 10 mg by mouth at bedtime as  needed for muscle spasms.     dexamethasone (DECADRON) 4 MG tablet 4 mg p.o. twice daily the day before, day of and day after the chemotherapy every 3 weeks. 40 tablet 2   dicyclomine (BENTYL) 10 MG capsule Take 10 mg by mouth every 6 (six) hours as needed. (Patient not taking: Reported on 02/28/2022)     diphenhydrAMINE (BENADRYL) 25 MG tablet Take 25 mg by mouth daily as needed for allergies.     famotidine (PEPCID) 20 MG  tablet Take 20 mg by mouth at bedtime.     folic acid (FOLVITE) 1 MG tablet TAKE 1 TABLET(1 MG) BY MOUTH DAILY 30 tablet 4   gabapentin (NEURONTIN) 100 MG capsule Take 100 mg by mouth 3 (three) times daily.     hydrocortisone 1 % lotion Apply 1 application topically 2 (two) times daily. 118 mL 0   hydroxychloroquine (PLAQUENIL) 200 MG tablet Take 200 mg by mouth 2 (two) times daily.     Multiple Vitamins-Minerals (MULTI ADULT GUMMIES) CHEW Chew 2 tablets by mouth daily.     omeprazole (PRILOSEC) 40 MG capsule Take 40 mg by mouth daily as needed (acid reflux).     oxyCODONE-acetaminophen (PERCOCET/ROXICET) 5-325 MG tablet Take 1 tablet by mouth every 8 (eight) hours as needed for severe pain. 30 tablet 0   Probiotic Product (PROBIOTIC-10 PO) Take by mouth.     prochlorperazine (COMPAZINE) 10 MG tablet Take 1 tablet (10 mg total) by mouth every 6 (six) hours as needed. (Patient not taking: Reported on 02/28/2022) 30 tablet 2   rizatriptan (MAXALT-MLT) 10 MG disintegrating tablet Take 10 mg by mouth 2 (two) times daily as needed.     tetrahydrozoline 0.05 % ophthalmic solution Place 1 drop into both eyes once. Systane Eye Drops once daily both eyes     triamcinolone ointment (KENALOG) 0.5 % Apply topically 3 (three) times daily.     No current facility-administered medications for this visit.    SURGICAL HISTORY:  Past Surgical History:  Procedure Laterality Date   ABDOMINAL HYSTERECTOMY     BRONCHIAL BIOPSY  04/24/2021   Procedure: BRONCHIAL BIOPSIES;  Surgeon: Collene Gobble, MD;  Location: [facility] ENDOSCOPY;  Service: Pulmonary;;   BRONCHIAL BRUSHINGS  04/24/2021   Procedure: BRONCHIAL BRUSHINGS;  Surgeon: Collene Gobble, MD;  Location: [facility] ENDOSCOPY;  Service: Pulmonary;;   BRONCHIAL NEEDLE ASPIRATION BIOPSY  04/24/2021   Procedure: BRONCHIAL NEEDLE ASPIRATION BIOPSIES;  Surgeon: Collene Gobble, MD;  Location: [facility];  Service: Pulmonary;;   BRONCHIAL WASHINGS  04/24/2021   Procedure:  BRONCHIAL WASHINGS;  Surgeon: Collene Gobble, MD;  Location: [facility];  Service: Pulmonary;;   CESAREAN SECTION  11/09/1990   DILATION AND CURETTAGE OF UTERUS Bilateral 09/09/1996   FIDUCIAL MARKER PLACEMENT  04/24/2021   Procedure: FIDUCIAL MARKER PLACEMENT;  Surgeon: Collene Gobble, MD;  Location: [facility] ENDOSCOPY;  Service: Pulmonary;;   TONSILLECTOMY  12/10/1977   VIDEO BRONCHOSCOPY WITH ENDOBRONCHIAL NAVIGATION Bilateral 04/24/2021   Procedure: VIDEO BRONCHOSCOPY WITH ENDOBRONCHIAL NAVIGATION;  Surgeon: Collene Gobble, MD;  Location: [facility] ENDOSCOPY;  Service: Pulmonary;  Laterality: Bilateral;    REVIEW OF SYSTEMS:   Constitutional: Positive for fatigue 1 week following treatment. Negative for appetite change, chills, fever and unexpected weight change.  HENT: Negative for mouth sores, nosebleeds, sore throat and trouble swallowing.   Eyes: Negative for eye problems and icterus.  Respiratory: Negative for hemoptysis, cough, shortness of breath and wheezing.   Cardiovascular: Negative for chest pain and  leg swelling.  Gastrointestinal: Positive for nausea and vomiting with clindamycin use . negative for diarrhea, abdominal pain, or constipation.  Genitourinary: Negative for bladder incontinence, difficulty urinating, dysuria, frequency and hematuria.   Musculoskeletal:  Positive for occasional sharp shooting pain down her foot. Negative for back pain, gait problem, neck pain and neck stiffness.  Skin: Positive for rash and itching on chest/shoulders.  Neurological: Negative for dizziness, extremity weakness, gait problem, headaches, light-headedness and seizures.  Hematological: Negative for adenopathy. Does not bruise/bleed easily.  Psychiatric/Behavioral: Negative for confusion, depression and sleep disturbance. The patient is not nervous/anxious.      PHYSICAL EXAMINATION:  There were no vitals taken for this visit.  ECOG PERFORMANCE STATUS: 1  Physical Exam  Constitutional:  Oriented to person, place, and time and well-developed, well-nourished, and in no distress.  HENT:  Head: Normocephalic and atraumatic.  Mouth/Throat: Oropharynx is clear and moist. No oropharyngeal exudate.  Eyes: Conjunctivae are normal. Right eye exhibits no discharge. Left eye exhibits no discharge. No scleral icterus.  Neck: Normal range of motion. Neck supple.  Cardiovascular: Normal rate, regular rhythm, normal heart sounds and intact distal pulses.   Pulmonary/Chest: Effort normal and breath sounds normal. No respiratory distress. No wheezes. No rales.  Abdominal: Soft. Bowel sounds are normal. Exhibits no distension and no mass. There is no tenderness.  Musculoskeletal: Normal range of motion. Exhibits no edema.  Lymphadenopathy:    No cervical adenopathy.  Neurological: Alert and oriented to person, place, and time. Exhibits normal muscle tone. Gait normal. Coordination normal.  Skin: Skin is warm and dry. Positive for scattered rash on her chest/shoulders. Not diaphoretic. No erythema. No pallor.  Psychiatric: Mood, memory and judgment normal.  Vitals reviewed.  LABORATORY DATA: Lab Results  Component Value Date   WBC 8.0 07/11/2022   HGB 15.1 (H) 07/11/2022   HCT 45.7 07/11/2022   MCV 97.2 07/11/2022   PLT 224 07/11/2022      Chemistry      Component Value Date/Time   NA 139 07/11/2022 0934   NA 140 03/13/2021 0000   K 3.6 07/11/2022 0934   CL 104 07/11/2022 0934   CO2 24 07/11/2022 0934   BUN 7 07/11/2022 0934   BUN 10 03/13/2021 0000   CREATININE 0.76 07/11/2022 0934   GLU 107 03/13/2021 0000      Component Value Date/Time   CALCIUM 9.9 07/11/2022 0934   ALKPHOS 99 07/11/2022 0934   AST 26 07/11/2022 0934   ALT 29 07/11/2022 0934   BILITOT 0.3 07/11/2022 0934       RADIOGRAPHIC STUDIES:  No results found.   ASSESSMENT/PLAN:  This is a very pleasant 51 year old Caucasian female diagnosed with stage IV (T4, N2, M1 a) non-small cell lung cancer,  adenocarcinoma presented with multifocal disease involving the right upper lobe, right lower lobe as well as suspicious lower paratracheal lymphadenopathy and groundglass nodules in the left lung diagnosed in May 2022. The patient had molecular studies performed by [facility] and that showed no detectable mutation but this is likely secondary to low-level circulating free tumor DNA. If the patient has disease progression in the future, then we will likely retest her for molecular studies.   The patient is currently undergoing systemic chemotherapy with carboplatin for an AUC 5, Alimta 500 mg per metered square, and and Avastin 1500 mg/kg IV every 3 weeks.  She is status post 19 cycles.  Starting from cycle #7, the patient has been on maintenance treatment with Alimta and  Avastin.  Labs were reviewed.  Recommend that she proceed with cycle #20 today as scheduled.  I will arrange for restaging CT scan of the chest, abdomen, pelvis prior to starting her next cycle of treatment.  Patient reports some fullness in her neck.  I will arrange for TSH to be drawn at her next lab visit.  The patient will see neurology on 08/14/2022 for the tingling in her right foot.  She will continue to use her cream prescribed by her dermatologist for her rash.  Overall, her rash is manageable with the cream.  We will see her back for follow-up visit in 3 weeks for evaluation and repeat blood work before starting cycle #21.  Her calcium is slightly high today at 10.7.  The patient mentions today she has been trying to be healthy with being active and eating good-sized portions of a variety of healthy foods.  She is also taking her "vitamins".  We will relay to the patient to avoid calcium rich foods/vitamins at this time due to her elevated calcium.  The patient was advised to call immediately if she has any concerning symptoms in the interval. The patient voices understanding of current disease status and treatment  options and is in agreement with the current care plan. All questions were answered. The patient knows to call the clinic with any problems, questions or concerns. We can certainly see the patient much sooner if necessary        No orders of the defined types were placed in this encounter.    The total time spent in the appointment was 20-29 minutes   Harvey Matlack L Raeana Blinn, PA-C 07/26/22

## 2022-08-01 ENCOUNTER — Inpatient Hospital Stay: Payer: Commercial Managed Care - PPO

## 2022-08-01 ENCOUNTER — Other Ambulatory Visit: Payer: Self-pay | Admitting: Physician Assistant

## 2022-08-01 ENCOUNTER — Other Ambulatory Visit: Payer: Self-pay

## 2022-08-01 ENCOUNTER — Inpatient Hospital Stay (HOSPITAL_BASED_OUTPATIENT_CLINIC_OR_DEPARTMENT_OTHER): Payer: Commercial Managed Care - PPO | Admitting: Physician Assistant

## 2022-08-01 VITALS — BP 148/84 | HR 84 | Temp 97.5°F | Resp 16 | Wt 120.4 lb

## 2022-08-01 DIAGNOSIS — Z5112 Encounter for antineoplastic immunotherapy: Secondary | ICD-10-CM | POA: Diagnosis not present

## 2022-08-01 DIAGNOSIS — C3491 Malignant neoplasm of unspecified part of right bronchus or lung: Secondary | ICD-10-CM

## 2022-08-01 DIAGNOSIS — Z5111 Encounter for antineoplastic chemotherapy: Secondary | ICD-10-CM | POA: Diagnosis not present

## 2022-08-01 DIAGNOSIS — E079 Disorder of thyroid, unspecified: Secondary | ICD-10-CM

## 2022-08-01 LAB — CBC WITH DIFFERENTIAL (CANCER CENTER ONLY)
Abs Immature Granulocytes: 0.02 10*3/uL (ref 0.00–0.07)
Basophils Absolute: 0 10*3/uL (ref 0.0–0.1)
Basophils Relative: 0 %
Eosinophils Absolute: 0 10*3/uL (ref 0.0–0.5)
Eosinophils Relative: 0 %
HCT: 39.9 % (ref 36.0–46.0)
Hemoglobin: 13.5 g/dL (ref 12.0–15.0)
Immature Granulocytes: 0 %
Lymphocytes Relative: 9 %
Lymphs Abs: 1 10*3/uL (ref 0.7–4.0)
MCH: 32.5 pg (ref 26.0–34.0)
MCHC: 33.8 g/dL (ref 30.0–36.0)
MCV: 96.1 fL (ref 80.0–100.0)
Monocytes Absolute: 0.5 10*3/uL (ref 0.1–1.0)
Monocytes Relative: 5 %
Neutro Abs: 9.4 10*3/uL — ABNORMAL HIGH (ref 1.7–7.7)
Neutrophils Relative %: 86 %
Platelet Count: 264 10*3/uL (ref 150–400)
RBC: 4.15 MIL/uL (ref 3.87–5.11)
RDW: 14.6 % (ref 11.5–15.5)
WBC Count: 10.9 10*3/uL — ABNORMAL HIGH (ref 4.0–10.5)
nRBC: 0 % (ref 0.0–0.2)

## 2022-08-01 LAB — CMP (CANCER CENTER ONLY)
ALT: 21 U/L (ref 0–44)
AST: 22 U/L (ref 15–41)
Albumin: 4.3 g/dL (ref 3.5–5.0)
Alkaline Phosphatase: 97 U/L (ref 38–126)
Anion gap: 9 (ref 5–15)
BUN: 11 mg/dL (ref 6–20)
CO2: 25 mmol/L (ref 22–32)
Calcium: 10.7 mg/dL — ABNORMAL HIGH (ref 8.9–10.3)
Chloride: 104 mmol/L (ref 98–111)
Creatinine: 0.62 mg/dL (ref 0.44–1.00)
GFR, Estimated: >60 mL/min (ref >60)
Glucose, Bld: 161 mg/dL — ABNORMAL HIGH (ref 70–99)
Potassium: 4 mmol/L (ref 3.5–5.1)
Sodium: 138 mmol/L (ref 135–145)
Total Bilirubin: 0.3 mg/dL (ref 0.3–1.2)
Total Protein: 7.3 g/dL (ref 6.5–8.1)

## 2022-08-01 LAB — TOTAL PROTEIN, URINE DIPSTICK: Protein, ur: NEGATIVE mg/dL

## 2022-08-01 MED ORDER — CYANOCOBALAMIN 1000 MCG/ML IJ SOLN
1000.0000 ug | Freq: Once | INTRAMUSCULAR | Status: AC
  Administered 2022-08-01: 1000 ug via INTRAMUSCULAR
  Filled 2022-08-01: qty 1

## 2022-08-01 MED ORDER — SODIUM CHLORIDE 0.9 % IV SOLN
15.0000 mg/kg | Freq: Once | INTRAVENOUS | Status: AC
  Administered 2022-08-01: 800 mg via INTRAVENOUS
  Filled 2022-08-01: qty 32

## 2022-08-01 MED ORDER — SODIUM CHLORIDE 0.9 % IV SOLN
10.0000 mg | Freq: Once | INTRAVENOUS | Status: AC
  Administered 2022-08-01: 10 mg via INTRAVENOUS
  Filled 2022-08-01: qty 10

## 2022-08-01 MED ORDER — SODIUM CHLORIDE 0.9 % IV SOLN
500.0000 mg/m2 | Freq: Once | INTRAVENOUS | Status: AC
  Administered 2022-08-01: 800 mg via INTRAVENOUS
  Filled 2022-08-01: qty 20

## 2022-08-01 MED ORDER — PROCHLORPERAZINE MALEATE 10 MG PO TABS
10.0000 mg | ORAL_TABLET | Freq: Four times a day (QID) | ORAL | Status: DC | PRN
  Administered 2022-08-01: 10 mg via ORAL
  Filled 2022-08-01: qty 1

## 2022-08-01 MED ORDER — SODIUM CHLORIDE 0.9 % IV SOLN: Freq: Once | INTRAVENOUS | Status: AC

## 2022-08-01 NOTE — Progress Notes (Signed)
The following biosimilar Mvasi (bevacizumab-awwb) has been selected for use in this patient per insurance.  Henreitta Leber, PharmD 08/01/22 @1600 

## 2022-08-01 NOTE — Patient Instructions (Signed)
[facility] ONCOLOGY  Discharge Instructions: Thank you for choosing [facility] to provide your oncology and hematology care.   If you have a lab appointment with the [facility], please go directly to the [facility] and check in at the registration area.   Wear comfortable clothing and clothing appropriate for easy access to any Portacath or PICC line.   We strive to give you quality time with your provider. You may need to reschedule your appointment if you arrive late (15 or more minutes).  Arriving late affects you and other patients whose appointments are after yours.  Also, if you miss three or more appointments without notifying the office, you may be dismissed from the clinic at the provider's discretion.      For prescription refill requests, have your pharmacy contact our office and allow 72 hours for refills to be completed.    Today you received the following chemotherapy and/or immunotherapy agents bevacizumab, pemetrexed      To help prevent nausea and vomiting after your treatment, we encourage you to take your nausea medication as directed.  BELOW ARE SYMPTOMS THAT SHOULD BE REPORTED IMMEDIATELY: *FEVER GREATER THAN 100.4 F (38 C) OR HIGHER *CHILLS OR SWEATING *NAUSEA AND VOMITING THAT IS NOT CONTROLLED WITH YOUR NAUSEA MEDICATION *UNUSUAL SHORTNESS OF BREATH *UNUSUAL BRUISING OR BLEEDING *URINARY PROBLEMS (pain or burning when urinating, or frequent urination) *BOWEL PROBLEMS (unusual diarrhea, constipation, pain near the anus) TENDERNESS IN MOUTH AND THROAT WITH OR WITHOUT PRESENCE OF ULCERS (sore throat, sores in mouth, or a toothache) UNUSUAL RASH, SWELLING OR PAIN  UNUSUAL VAGINAL DISCHARGE OR ITCHING   Items with * indicate a potential emergency and should be followed up as soon as possible or go to the Emergency Department if any problems should occur.  Please show the CHEMOTHERAPY ALERT CARD or IMMUNOTHERAPY ALERT CARD at  check-in to the Emergency Department and triage nurse.  Should you have questions after your visit or need to cancel or reschedule your appointment, please contact [facility]  Dept: [phone]  and follow the prompts.  Office hours are 8:00 a.m. to 4:30 p.m. Monday - Friday. Please note that voicemails left after 4:00 p.m. may not be returned until the following business day.  We are closed weekends and major holidays. You have access to a nurse at all times for urgent questions. Please call the main number to the clinic Dept: [phone] and follow the prompts.   For any non-urgent questions, you may also contact your provider using MyChart. We now offer e-Visits for anyone 36 and older to request care online for non-urgent symptoms. For details visit [email or web address].   Also download the MyChart app! Go to the app store, search "MyChart", open the app, select [facility], and log in with your MyChart username and password.  Masks are optional in the cancer centers. If you would like for your care team to wear a mask while they are taking care of you, please let them know. You may have one support person who is at least 52 years old accompany you for your appointments.

## 2022-08-10 ENCOUNTER — Other Ambulatory Visit: Payer: Self-pay

## 2022-08-14 ENCOUNTER — Ambulatory Visit (INDEPENDENT_AMBULATORY_CARE_PROVIDER_SITE_OTHER): Payer: Commercial Managed Care - PPO | Admitting: Neurology

## 2022-08-14 DIAGNOSIS — M5417 Radiculopathy, lumbosacral region: Secondary | ICD-10-CM

## 2022-08-14 DIAGNOSIS — R202 Paresthesia of skin: Secondary | ICD-10-CM

## 2022-08-14 NOTE — Procedures (Signed)
[facility] Neurology  [address]  [place], [place] [address] Tel: [phone] Fax:  [phone] Test Date:  08/14/2022  Patient: Kirsten Williams DOB: [birth year] Physician: Narda Amber, DO  Sex: Female Height: 5\' 1"  Ref Phys: Teresa Coombs, DO  ID#: [id number]   Technician:    Patient Complaints: This is a 51 year old female referred for evaluation of bilateral leg pain, worse on the right.  NCV & EMG Findings: Extensive electrodiagnostic testing of the right lower extremity and additional studies of the left shows:  Bilateral sural and superficial peroneal sensory responses are within normal limits, however, right superficial peroneal sensory response is asymmetrically reduced as compared to the left. Right peroneal motor response at the extensor digitorum brevis is reduced (1.8 mV), and normal at the tibialis anterior.  Right tibial motor response also shows reduced amplitude (2.8 mV).  Left peroneal and tibial motor responses are within normal limits. Bilateral tibial H reflex studies are within normal limits. Chronic motor axonal loss changes are seen affecting the right anterior tibialis and flexor digitorum longus muscles, without accompanied active denervation.  Impression: Chronic L5 radiculopathy affecting the right lower extremity, mild. There is no evidence of a large fiber sensorimotor polyneuropathy affecting the lower extremities.   ___________________________ Narda Amber, DO    Nerve Conduction Studies Anti Sensory Summary Table   Stim Site NR Peak (ms) Norm Peak (ms) O-P Amp (V) Norm O-P Amp  Left Sup Peroneal Anti Sensory (Ant Lat Mall)  32C  12 cm    2.5 <4.6 18.4 >4  Right Sup Peroneal Anti Sensory (Ant Lat Mall)  32C  12 cm    2.9 <4.6 4.3 >4  Left Sural Anti Sensory (Lat Mall)  32C  Calf    4.3 <4.6 7.2 >4  Right Sural Anti Sensory (Lat Mall)  32C  Calf    3.5 <4.6 7.6 >4   Motor Summary Table   Stim Site NR Onset (ms) Norm  Onset (ms) O-P Amp (mV) Norm O-P Amp Site1 Site2 Delta-0 (ms) Dist (cm) Vel (m/s) Norm Vel (m/s)  Left Peroneal Motor (Ext Dig Brev)  32C  Ankle    4.3 <6.0 2.7 >2.5 B Fib Ankle 5.9 31.0 53 >40  B Fib    10.2  2.3  Poplt B Fib 1.7 8.0 47 >40  Poplt    11.9  2.2         Right Peroneal Motor (Ext Dig Brev)  32C  Ankle    5.1 <6.0 1.8 >2.5 B Fib Ankle 6.9 32.0 46 >40  B Fib    12.0  1.3  Poplt B Fib 1.4 8.0 57 >40  Poplt    13.4  1.3         Right Peroneal TA Motor (Tib Ant)  32C  Fib Head    2.6 <4.5 6.7 >3 Poplit Fib Head 1.2 7.0 58 >40  Poplit    3.8  6.6         Left Tibial Motor (Abd Hall Brev)  32C  Ankle    4.8 <6.0 5.5 >4 Knee Ankle 7.5 38.0 51 >40  Knee    12.3  4.5         Right Tibial Motor (Abd Hall Brev)  32C  Ankle    4.0 <6.0 2.8 >4 Knee Ankle 7.0 39.0 56 >40  Knee    11.0  1.9          H Reflex Studies   NR H-Lat (ms)  Lat Norm (ms) L-R H-Lat (ms)  Left Tibial (Gastroc)  32C     30.61 <35 0.00  Right Tibial (Gastroc)  32C     30.61 <35 0.00   EMG   Side Muscle Ins Act Fibs Fasc Recrt Dur. Amp. Poly. Activation Comment  Right AntTibialis Nml Nml Nml 1- 1+ 1+ 1+ Nml N/A  Right Gastroc Nml Nml Nml Nml Nml Nml Nml Nml N/A  Right Flex Dig Long Nml Nml Nml 1- 1+ 1+ 1+ Nml N/A  Right RectFemoris Nml Nml Nml Nml Nml Nml Nml Nml N/A  Right GluteusMed Nml Nml Nml Nml Nml Nml Nml Nml N/A  Right BicepsFemS Nml Nml Nml Nml Nml Nml Nml Nml N/A  Left AntTibialis Nml Nml Nml Nml Nml Nml Nml Nml N/A  Left Gastroc Nml Nml Nml Nml Nml Nml Nml Nml N/A  Left Flex Dig Long Nml Nml Nml Nml Nml Nml Nml Nml N/A  Left RectFemoris Nml Nml Nml Nml Nml Nml Nml Nml N/A      Waveforms:

## 2022-08-20 ENCOUNTER — Ambulatory Visit (HOSPITAL_COMMUNITY)
Admission: RE | Admit: 2022-08-20 | Discharge: 2022-08-20 | Disposition: A | Discharge status: Home / Self Care | Payer: Commercial Managed Care - PPO | Source: Ambulatory Visit | Attending: Physician Assistant | Admitting: Physician Assistant

## 2022-08-20 DIAGNOSIS — C3491 Malignant neoplasm of unspecified part of right bronchus or lung: Secondary | ICD-10-CM | POA: Diagnosis present

## 2022-08-20 MED ORDER — SODIUM CHLORIDE (PF) 0.9 % IJ SOLN
INTRAMUSCULAR | Status: AC
  Filled 2022-08-20: qty 50

## 2022-08-20 MED ORDER — IOHEXOL 300 MG/ML  SOLN
100.0000 mL | Freq: Once | INTRAMUSCULAR | Status: AC | PRN
  Administered 2022-08-20: 80 mL via INTRAVENOUS

## 2022-08-22 MED FILL — Dexamethasone Sodium Phosphate Inj 100 MG/10ML: INTRAMUSCULAR | Qty: 1 | Status: AC

## 2022-08-22 NOTE — Progress Notes (Unsigned)
Kirsten Williams OFFICE PROGRESS NOTE  Kirsten Williams [place] [address]  DIAGNOSIS:  Stage IV (T4, N2, M1 a) non-small cell lung cancer, adenocarcinoma presented with multifocal disease involving the right upper lobe, right lower lobe as well as suspicious lower paratracheal lymphadenopathy and groundglass nodules in the left lung diagnosed in May 2022. The patient had molecular studies performed by [facility] and that showed no detectable mutation but this is likely secondary to low-level circulating free tumor DNA.  PRIOR THERAPY: None  CURRENT THERAPY:  Palliative systemic chemotherapy with carboplatin for AUC of 5, Alimta 500 Mg/M2 and Avastin 15 Mg/KG every 3 weeks.  The patient is not a great candidate for immunotherapy in the event that she has a genetic mutation.  She is status post 20 cycles. Starting from cycle #6, the patient will start maintenance Alimta and Avastin.  INTERVAL HISTORY: Kirsten Williams 51 y.o. female returns to the clinic today for a follow-up visit accompanied by her husband. The patient is feeling fairly well today without any concerning complaints. She is tolerating her treatment fairly well except she does sometimes have fatigue the week following treatment. She is very motivated and is trying to be active and eat healthy. She has been going to the gym with her daughters. She was also trying to take vitamin supplements to be healthy but her calcium was a little elevated at her last appointment and she was told to discontinue her vitamins. She cut back on her vitamins every 2-3 days but continues to take calcium. She also likes to eat dairy products. She mentions her thyroid looks a little swollen in her neck. She denies any fever or chills.  She has occasional night sweats which is not new for her she has these intermittently. She reports a fine appetite. She denies any significant shortness of breath unless with strenuous activity.  Denies chest pain. She reports she sometimes coughs up dark/black appearing in her phlegm. She denies overt bleeding. She estimates this happed on two occassions last week.  A few days following treatment she sometimes has a tightness around her diaphragm.  She is prone to headaches due to history of migraines but did not report any changes in her headaches. Denies any changes with her bowel habits. She sometimes develops a rash for which her dermatologist gave her cream which controlling her rash. She is seeing neurology for tingling in her right foot. She recently had a restaging CT scan. She is here for evaluation and repeat blood work before starting cycle #21.    MEDICAL HISTORY: Past Medical History:  Diagnosis Date   Adenocarcinoma, lung, right ([place]) 05/03/2021   Kirsten Williams is a 51 y.o. female with a history of lung nodules dating back to 2019 who is referred in consultation with Kirsten Williams for assessment and management. She is a former smoker having quit in 2016. 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 She missed her screening CT last year and imaging was ordered last month. CT chest from 02-16-2021 reveals pro   Anxiety    GERD (gastroesophageal reflux disease)    SLE (systemic lupus erythematosus) ([place]) 05/03/2021   SLE (systemic lupus erythematosus) ([place]) 05/03/2021    ALLERGIES:  is allergic to clindamycin/lincomycin and carboplatin.  MEDICATIONS:  Current Outpatient Medications  Medication Sig Dispense Refill   B Complex Vitamins (B COMPLEX 50 PO) Take by mouth.     Calcium Carbonate-Vit D-Min (  CALCIUM 1200 PO) Take 1 capsule by mouth daily.     cholecalciferol (VITAMIN D3) 25 MCG (1000 UNIT) tablet Take 2,000 Units by mouth daily.     cyclobenzaprine (FLEXERIL) 10 MG tablet Take 10 mg by mouth at bedtime as needed for muscle spasms.     dexamethasone (DECADRON) 4 MG tablet 4 mg p.o. twice daily the day before, day of and day after the  chemotherapy every 3 weeks. 40 tablet 2   dicyclomine (BENTYL) 10 MG capsule Take 10 mg by mouth every 6 (six) hours as needed. (Patient not taking: Reported on 02/28/2022)     diphenhydrAMINE (BENADRYL) 25 MG tablet Take 25 mg by mouth daily as needed for allergies.     famotidine (PEPCID) 20 MG tablet Take 20 mg by mouth at bedtime.     folic acid (FOLVITE) 1 MG tablet TAKE 1 TABLET(1 MG) BY MOUTH DAILY 30 tablet 4   gabapentin (NEURONTIN) 100 MG capsule Take 100 mg by mouth 3 (three) times daily.     hydrocortisone 1 % lotion Apply 1 application topically 2 (two) times daily. 118 mL 0   hydroxychloroquine (PLAQUENIL) 200 MG tablet Take 200 mg by mouth 2 (two) times daily.     Multiple Vitamins-Minerals (MULTI ADULT GUMMIES) CHEW Chew 2 tablets by mouth daily.     omeprazole (PRILOSEC) 40 MG capsule Take 40 mg by mouth daily as needed (acid reflux).     oxyCODONE-acetaminophen (PERCOCET/ROXICET) 5-325 MG tablet Take 1 tablet by mouth every 8 (eight) hours as needed for severe pain. 30 tablet 0   Probiotic Product (PROBIOTIC-10 PO) Take by mouth.     prochlorperazine (COMPAZINE) 10 MG tablet Take 1 tablet (10 mg total) by mouth every 6 (six) hours as needed. (Patient not taking: Reported on 02/28/2022) 30 tablet 2   rizatriptan (MAXALT-MLT) 10 MG disintegrating tablet Take 10 mg by mouth 2 (two) times daily as needed.     tetrahydrozoline 0.05 % ophthalmic solution Place 1 drop into both eyes once. Systane Eye Drops once daily both eyes     triamcinolone ointment (KENALOG) 0.5 % Apply topically 3 (three) times daily.     No current facility-administered medications for this visit.    SURGICAL HISTORY:  Past Surgical History:  Procedure Laterality Date   ABDOMINAL HYSTERECTOMY     BRONCHIAL BIOPSY  04/24/2021   Procedure: BRONCHIAL BIOPSIES;  Surgeon: Collene Gobble, MD;  Location: [facility] ENDOSCOPY;  Service: Pulmonary;;   BRONCHIAL BRUSHINGS  04/24/2021   Procedure: BRONCHIAL BRUSHINGS;   Surgeon: Collene Gobble, MD;  Location: [facility] ENDOSCOPY;  Service: Pulmonary;;   BRONCHIAL NEEDLE ASPIRATION BIOPSY  04/24/2021   Procedure: BRONCHIAL NEEDLE ASPIRATION BIOPSIES;  Surgeon: Collene Gobble, MD;  Location: [facility];  Service: Pulmonary;;   BRONCHIAL WASHINGS  04/24/2021   Procedure: BRONCHIAL WASHINGS;  Surgeon: Collene Gobble, MD;  Location: [place];  Service: Pulmonary;;   CESAREAN SECTION  11/09/1990   DILATION AND CURETTAGE OF UTERUS Bilateral 09/09/1996   FIDUCIAL MARKER PLACEMENT  04/24/2021   Procedure: FIDUCIAL MARKER PLACEMENT;  Surgeon: Collene Gobble, MD;  Location: [facility] ENDOSCOPY;  Service: Pulmonary;;   TONSILLECTOMY  12/10/1977   VIDEO BRONCHOSCOPY WITH ENDOBRONCHIAL NAVIGATION Bilateral 04/24/2021   Procedure: VIDEO BRONCHOSCOPY WITH ENDOBRONCHIAL NAVIGATION;  Surgeon: Collene Gobble, MD;  Location: [facility] ENDOSCOPY;  Service: Pulmonary;  Laterality: Bilateral;    REVIEW OF SYSTEMS:   Constitutional: Positive for fatigue 1 week following treatment. Negative for appetite change, chills, fever  and unexpected weight change.  HENT: Negative for mouth sores, nosebleeds, sore throat and trouble swallowing.   Eyes: Negative for eye problems and icterus.  Respiratory: Positive for occasional dark phlegm. Negative for hemoptysis, cough, shortness of breath and wheezing.   Cardiovascular: Negative for chest pain and leg swelling.  Gastrointestinal: negative for nausea, diarrhea, abdominal pain, or constipation.  Genitourinary: Negative for bladder incontinence, difficulty urinating, dysuria, frequency and hematuria.   Musculoskeletal:  Positive for occasional sharp shooting pain down her foot. Negative for back pain, gait problem, neck pain and neck stiffness.  Skin: Positive for rash and itching on chest/shoulders.  Neurological: Negative for dizziness, extremity weakness, gait problem, headaches, light-headedness and seizures.  Hematological: Negative for adenopathy.  Does not bruise/bleed easily.  Psychiatric/Behavioral: Negative for confusion, depression and sleep disturbance. The patient is not nervous/anxious.    PHYSICAL EXAMINATION:  There were no vitals taken for this visit.  ECOG PERFORMANCE STATUS: 1  Physical Exam  Constitutional: Oriented to person, place, and time and well-developed, well-nourished, and in no distress. HENT:  Head: Normocephalic and atraumatic.  Mouth/Throat: Oropharynx is clear and moist. No oropharyngeal exudate.  Eyes: Conjunctivae are normal. Right eye exhibits no discharge. Left eye exhibits no discharge. No scleral icterus.  Neck: Normal range of motion. Neck supple.  Cardiovascular: Normal rate, regular rhythm, normal heart sounds and intact distal pulses.   Pulmonary/Chest: Effort normal and breath sounds normal. No respiratory distress. No wheezes. No rales.  Abdominal: Soft. Bowel sounds are normal. Exhibits no distension and no mass. There is no tenderness.  Musculoskeletal: Normal range of motion. Exhibits no edema.  Lymphadenopathy:    No cervical adenopathy. Mild swelling over thyroid. Neurological: Alert and oriented to person, place, and time. Exhibits normal muscle tone. Gait normal. Coordination normal.  Skin: Skin is warm and dry. Mild rash on right shoulder. Not diaphoretic. No erythema. No pallor.  Psychiatric: Mood, memory and judgment normal.  Vitals reviewed.  LABORATORY DATA: Lab Results  Component Value Date   WBC 10.9 (H) 08/01/2022   HGB 13.5 08/01/2022   HCT 39.9 08/01/2022   MCV 96.1 08/01/2022   PLT 264 08/01/2022      Chemistry      Component Value Date/Time   NA 138 08/01/2022 0949   NA 140 03/13/2021 0000   K 4.0 08/01/2022 0949   CL 104 08/01/2022 0949   CO2 25 08/01/2022 0949   BUN 11 08/01/2022 0949   BUN 10 03/13/2021 0000   CREATININE 0.62 08/01/2022 0949   GLU 107 03/13/2021 0000      Component Value Date/Time   CALCIUM 10.7 (H) 08/01/2022 0949   ALKPHOS 97  08/01/2022 0949   AST 22 08/01/2022 0949   ALT 21 08/01/2022 0949   BILITOT 0.3 08/01/2022 0949       RADIOGRAPHIC STUDIES:  CT Chest W Contrast  Result Date: 08/20/2022 CLINICAL DATA:  Stage IV non-small cell lung cancer (adenocarcinoma) on palliative systemic chemotherapy. Restaging. * Tracking Code: BO * EXAM: CT CHEST, ABDOMEN, AND PELVIS WITH CONTRAST TECHNIQUE: Multidetector CT imaging of the chest, abdomen and pelvis was performed following the standard protocol during bolus administration of intravenous contrast. RADIATION DOSE REDUCTION: This exam was performed according to the departmental dose-optimization program which includes automated exposure control, adjustment of the mA and/or kV according to patient size and/or use of iterative reconstruction technique. CONTRAST:  63mL OMNIPAQUE IOHEXOL 300 MG/ML  SOLN COMPARISON:  05/28/2022 CT chest, abdomen and pelvis. FINDINGS: CT CHEST FINDINGS Cardiovascular:  Normal heart size. No significant pericardial effusion/thickening. Atherosclerotic nonaneurysmal thoracic aorta. Normal caliber pulmonary arteries. No central pulmonary emboli. Mediastinum/Nodes: No discrete thyroid nodules. Unremarkable esophagus. No axillary adenopathy. Mildly enlarged 1.0 cm right paratracheal node (series 2/image 19), stable. No new pathologically enlarged mediastinal nodes. No hilar adenopathy. Lungs/Pleura: No pneumothorax. No pleural effusion. No acute consolidative airspace disease. Part solid 2.1 x 1.3 cm superior segment right lower lobe nodule (series 4/image 47), previously 2.1 x 1.3 cm using similar measurement technique, stable. Part solid 1.3 x 0.9 cm posterior right upper lobe nodule (series 4/image 23), previously 1.3 x 0.9 cm, stable. Ground-glass 2.1 x 1.4 cm peripheral left upper lobe nodule (series 4/image 28), previously 2.1 x 1.4 cm, stable. A few scattered small solid pulmonary nodules in both lungs, largest 0.4 cm in the anterior left lower lobe  (series 4/image 106), all stable. No new significant pulmonary nodules. Musculoskeletal: No aggressive appearing focal osseous lesions. Minimal thoracic spondylosis. CT ABDOMEN PELVIS FINDINGS Hepatobiliary: Normal liver with no liver mass. Normal gallbladder with no radiopaque cholelithiasis. No biliary ductal dilatation. Pancreas: Normal, with no mass or duct dilation. Spleen: Normal size. No mass. Adrenals/Urinary Tract: Stable right adrenal 2.2 cm nodule with density 49 HU and stable 1.8 cm left adrenal nodule with density 44 HU, previously characterized as benign adenomas. Normal kidneys with no hydronephrosis and no renal mass. Normal bladder. Stomach/Bowel: Normal non-distended stomach. Normal caliber small bowel with no small bowel wall thickening. Normal appendix. Oral contrast transits to the distal colon. Normal large bowel with no diverticulosis, large bowel wall thickening or pericolonic fat stranding. Vascular/Lymphatic: Atherosclerotic nonaneurysmal abdominal aorta. Patent portal, splenic, hepatic and renal veins. No pathologically enlarged lymph nodes in the abdomen or pelvis. Reproductive: Status post hysterectomy, with no abnormal findings at the vaginal cuff. No adnexal mass. Other: No pneumoperitoneum, ascites or focal fluid collection. Musculoskeletal: No aggressive appearing focal osseous lesions. Mild lumbar spondylosis. IMPRESSION: 1. Stable CT exam. Part-solid, groundglass and tiny solid bilateral pulmonary nodules are all stable. 2. Mild right paratracheal lymphadenopathy is stable. 3. No new or progressive metastatic disease in the chest, abdomen or pelvis. 4. Stable bilateral adrenal adenomas, for which no follow-up imaging is recommended. 5. Aortic Atherosclerosis (ICD10-I70.0). Electronically Signed   By: Ilona Sorrel M.D.   On: 08/20/2022 12:27   CT Abdomen Pelvis W Contrast  Result Date: 08/20/2022 CLINICAL DATA:  Stage IV non-small cell lung cancer (adenocarcinoma) on palliative  systemic chemotherapy. Restaging. * Tracking Code: BO * EXAM: CT CHEST, ABDOMEN, AND PELVIS WITH CONTRAST TECHNIQUE: Multidetector CT imaging of the chest, abdomen and pelvis was performed following the standard protocol during bolus administration of intravenous contrast. RADIATION DOSE REDUCTION: This exam was performed according to the departmental dose-optimization program which includes automated exposure control, adjustment of the mA and/or kV according to patient size and/or use of iterative reconstruction technique. CONTRAST:  90mL OMNIPAQUE IOHEXOL 300 MG/ML  SOLN COMPARISON:  05/28/2022 CT chest, abdomen and pelvis. FINDINGS: CT CHEST FINDINGS Cardiovascular: Normal heart size. No significant pericardial effusion/thickening. Atherosclerotic nonaneurysmal thoracic aorta. Normal caliber pulmonary arteries. No central pulmonary emboli. Mediastinum/Nodes: No discrete thyroid nodules. Unremarkable esophagus. No axillary adenopathy. Mildly enlarged 1.0 cm right paratracheal node (series 2/image 19), stable. No new pathologically enlarged mediastinal nodes. No hilar adenopathy. Lungs/Pleura: No pneumothorax. No pleural effusion. No acute consolidative airspace disease. Part solid 2.1 x 1.3 cm superior segment right lower lobe nodule (series 4/image 47), previously 2.1 x 1.3 cm using similar measurement technique, stable. Part solid  1.3 x 0.9 cm posterior right upper lobe nodule (series 4/image 23), previously 1.3 x 0.9 cm, stable. Ground-glass 2.1 x 1.4 cm peripheral left upper lobe nodule (series 4/image 28), previously 2.1 x 1.4 cm, stable. A few scattered small solid pulmonary nodules in both lungs, largest 0.4 cm in the anterior left lower lobe (series 4/image 106), all stable. No new significant pulmonary nodules. Musculoskeletal: No aggressive appearing focal osseous lesions. Minimal thoracic spondylosis. CT ABDOMEN PELVIS FINDINGS Hepatobiliary: Normal liver with no liver mass. Normal gallbladder with no  radiopaque cholelithiasis. No biliary ductal dilatation. Pancreas: Normal, with no mass or duct dilation. Spleen: Normal size. No mass. Adrenals/Urinary Tract: Stable right adrenal 2.2 cm nodule with density 49 HU and stable 1.8 cm left adrenal nodule with density 44 HU, previously characterized as benign adenomas. Normal kidneys with no hydronephrosis and no renal mass. Normal bladder. Stomach/Bowel: Normal non-distended stomach. Normal caliber small bowel with no small bowel wall thickening. Normal appendix. Oral contrast transits to the distal colon. Normal large bowel with no diverticulosis, large bowel wall thickening or pericolonic fat stranding. Vascular/Lymphatic: Atherosclerotic nonaneurysmal abdominal aorta. Patent portal, splenic, hepatic and renal veins. No pathologically enlarged lymph nodes in the abdomen or pelvis. Reproductive: Status post hysterectomy, with no abnormal findings at the vaginal cuff. No adnexal mass. Other: No pneumoperitoneum, ascites or focal fluid collection. Musculoskeletal: No aggressive appearing focal osseous lesions. Mild lumbar spondylosis. IMPRESSION: 1. Stable CT exam. Part-solid, groundglass and tiny solid bilateral pulmonary nodules are all stable. 2. Mild right paratracheal lymphadenopathy is stable. 3. No new or progressive metastatic disease in the chest, abdomen or pelvis. 4. Stable bilateral adrenal adenomas, for which no follow-up imaging is recommended. 5. Aortic Atherosclerosis (ICD10-I70.0). Electronically Signed   By: Ilona Sorrel M.D.   On: 08/20/2022 12:27   NCV with EMG(electromyography)  Result Date: 08/14/2022 Alda Berthold, DO     08/14/2022 12:54 PM [facility], [place]  [place], [place] [address] Tel: [phone] Fax:  [phone] Test Date:  08/14/2022 Patient: Kirsten Williams DOB: [birth year] Physician: Narda Amber, DO Sex: Female Height: 5\' 1"  Ref Phys: Teresa Coombs, DO ID#: [id number]   Technician:  Patient Complaints:  This is a 51 year old female referred for evaluation of bilateral leg pain, worse on the right. NCV & EMG Findings: Extensive electrodiagnostic testing of the right lower extremity and additional studies of the left shows: Bilateral sural and superficial peroneal sensory responses are within normal limits, however, right superficial peroneal sensory response is asymmetrically reduced as compared to the left. Right peroneal motor response at the extensor digitorum brevis is reduced (1.8 mV), and normal at the tibialis anterior.  Right tibial motor response also shows reduced amplitude (2.8 mV).  Left peroneal and tibial motor responses are within normal limits. Bilateral tibial H reflex studies are within normal limits. Chronic motor axonal loss changes are seen affecting the right anterior tibialis and flexor digitorum longus muscles, without accompanied active denervation. Impression: Chronic L5 radiculopathy affecting the right lower extremity, mild. There is no evidence of a large fiber sensorimotor polyneuropathy affecting the lower extremities. ___________________________ Narda Amber, DO Nerve Conduction Studies Anti Sensory Summary Table  Stim Site NR Peak (ms) Norm Peak (ms) O-P Amp (V) Norm O-P Amp Left Sup Peroneal Anti Sensory (Ant Lat Mall)  32C 12 cm    2.5 <4.6 18.4 >4 Right Sup Peroneal Anti Sensory (Ant Lat Mall)  32C 12 cm    2.9 <4.6 4.3 >4 Left Sural  Anti Sensory (Lat Mall)  32C Calf    4.3 <4.6 7.2 >4 Right Sural Anti Sensory (Lat Mall)  32C Calf    3.5 <4.6 7.6 >4 Motor Summary Table  Stim Site NR Onset (ms) Norm Onset (ms) O-P Amp (mV) Norm O-P Amp Site1 Site2 Delta-0 (ms) Dist (cm) Vel (m/s) Norm Vel (m/s) Left Peroneal Motor (Ext Dig Brev)  32C Ankle    4.3 <6.0 2.7 >2.5 B Fib Ankle 5.9 31.0 53 >40 B Fib    10.2  2.3  Poplt B Fib 1.7 8.0 47 >40 Poplt    11.9  2.2        Right Peroneal Motor (Ext Dig Brev)  32C Ankle    5.1 <6.0 1.8 >2.5 B Fib Ankle 6.9 32.0 46 >40 B Fib    12.0  1.3   Poplt B Fib 1.4 8.0 57 >40 Poplt    13.4  1.3        Right Peroneal TA Motor (Tib Ant)  32C Fib Head    2.6 <4.5 6.7 >3 Poplit Fib Head 1.2 7.0 58 >40 Poplit    3.8  6.6        Left Tibial Motor (Abd Hall Brev)  32C Ankle    4.8 <6.0 5.5 >4 Knee Ankle 7.5 38.0 51 >40 Knee    12.3  4.5        Right Tibial Motor (Abd Hall Brev)  32C Ankle    4.0 <6.0 2.8 >4 Knee Ankle 7.0 39.0 56 >40 Knee    11.0  1.9        H Reflex Studies  NR H-Lat (ms) Lat Norm (ms) L-R H-Lat (ms) Left Tibial (Gastroc)  32C    30.61 <35 0.00 Right Tibial (Gastroc)  32C    30.61 <35 0.00 EMG  Side Muscle Ins Act Fibs Fasc Recrt Dur. Amp. Poly. Activation Comment Right AntTibialis Nml Nml Nml 1- 1+ 1+ 1+ Nml N/A Right Gastroc Nml Nml Nml Nml Nml Nml Nml Nml N/A Right Flex Dig Long Nml Nml Nml 1- 1+ 1+ 1+ Nml N/A Right RectFemoris Nml Nml Nml Nml Nml Nml Nml Nml N/A Right GluteusMed Nml Nml Nml Nml Nml Nml Nml Nml N/A Right BicepsFemS Nml Nml Nml Nml Nml Nml Nml Nml N/A Left AntTibialis Nml Nml Nml Nml Nml Nml Nml Nml N/A Left Gastroc Nml Nml Nml Nml Nml Nml Nml Nml N/A Left Flex Dig Long Nml Nml Nml Nml Nml Nml Nml Nml N/A Left RectFemoris Nml Nml Nml Nml Nml Nml Nml Nml N/A Waveforms:                ASSESSMENT/PLAN:  This is a very pleasant 51 year old Caucasian female diagnosed with stage IV (T4, N2, M1 a) non-small cell lung cancer, adenocarcinoma presented with multifocal disease involving the right upper lobe, right lower lobe as well as suspicious lower paratracheal lymphadenopathy and groundglass nodules in the left lung diagnosed in May 2022. The patient had molecular studies performed by [facility] and that showed no detectable mutation but this is likely secondary to low-level circulating free tumor DNA. If the patient has disease progression in the future, then we will likely retest her for molecular studies.   The patient is currently undergoing systemic chemotherapy with carboplatin for an AUC 5, Alimta 500 mg per metered  square, and and Avastin 1500 mg/kg IV every 3 weeks.  She is status post 20 cycles.  Starting from cycle #7, the patient has been on  maintenance treatment with Alimta and Avastin.   Labs were reviewed.  Recommend that she proceed with cycle #21 today as scheduled.   The patient was seen with Dr. Julien Nordmann today.  The patient recently had a restaging CT scan performed.  Dr. Julien Nordmann personally independently reviewed the scan discussed results with the patient today.  The scan showed no evidence of disease progression.  We will see her back for follow-up visit in 3 weeks for evaluation and repeat blood work before starting cycle #22.  Her calcium continues to be high. Advised a low calcium diet, avoid any supplements, and drink plenty of fluids.   I have added a TSH to her labs at her next visit for the neck swelling. They are not able to run this on the labs drawn today, per lab.   Advised to monitor the dark phlegm closely. This occurred about 2x last week. If she experiences hemoptysis, advised to seek emergency evaluation. If she every has concern for hemoptysis, we will likely need to discontinue alimta.   She will continue to use her lotion prescribed by her dermatologist.   The patient was advised to call immediately if she has any concerning symptoms in the interval. The patient voices understanding of current disease status and treatment options and is in agreement with the current care plan. All questions were answered. The patient knows to call the clinic with any problems, questions or concerns. We can certainly see the patient much sooner if necessary   No orders of the defined types were placed in this encounter.    The total time spent in the appointment was ***  Rodriquez Thorner L Ezell Poke, Williams 08/22/22

## 2022-08-23 ENCOUNTER — Inpatient Hospital Stay: Payer: Commercial Managed Care - PPO | Attending: Hematology and Oncology

## 2022-08-23 ENCOUNTER — Inpatient Hospital Stay: Payer: Commercial Managed Care - PPO | Admitting: Physician Assistant

## 2022-08-23 ENCOUNTER — Inpatient Hospital Stay: Payer: Commercial Managed Care - PPO

## 2022-08-23 ENCOUNTER — Other Ambulatory Visit: Payer: Self-pay

## 2022-08-23 VITALS — BP 159/90 | HR 90 | Temp 98.1°F | Resp 17 | Ht 61.0 in | Wt 119.8 lb

## 2022-08-23 DIAGNOSIS — R221 Localized swelling, mass and lump, neck: Secondary | ICD-10-CM | POA: Insufficient documentation

## 2022-08-23 DIAGNOSIS — C3411 Malignant neoplasm of upper lobe, right bronchus or lung: Secondary | ICD-10-CM | POA: Diagnosis present

## 2022-08-23 DIAGNOSIS — C3491 Malignant neoplasm of unspecified part of right bronchus or lung: Secondary | ICD-10-CM | POA: Diagnosis not present

## 2022-08-23 DIAGNOSIS — Z5111 Encounter for antineoplastic chemotherapy: Secondary | ICD-10-CM | POA: Insufficient documentation

## 2022-08-23 DIAGNOSIS — Z5112 Encounter for antineoplastic immunotherapy: Secondary | ICD-10-CM | POA: Insufficient documentation

## 2022-08-23 DIAGNOSIS — R5383 Other fatigue: Secondary | ICD-10-CM

## 2022-08-23 DIAGNOSIS — M329 Systemic lupus erythematosus, unspecified: Secondary | ICD-10-CM | POA: Insufficient documentation

## 2022-08-23 HISTORY — DX: Hypercalcemia: E83.52

## 2022-08-23 LAB — CBC WITH DIFFERENTIAL (CANCER CENTER ONLY)
Abs Immature Granulocytes: 0.03 10*3/uL (ref 0.00–0.07)
Basophils Absolute: 0 10*3/uL (ref 0.0–0.1)
Basophils Relative: 0 %
Eosinophils Absolute: 0 10*3/uL (ref 0.0–0.5)
Eosinophils Relative: 0 %
HCT: 43.4 % (ref 36.0–46.0)
Hemoglobin: 14.3 g/dL (ref 12.0–15.0)
Immature Granulocytes: 0 %
Lymphocytes Relative: 12 %
Lymphs Abs: 1.1 10*3/uL (ref 0.7–4.0)
MCH: 32 pg (ref 26.0–34.0)
MCHC: 32.9 g/dL (ref 30.0–36.0)
MCV: 97.1 fL (ref 80.0–100.0)
Monocytes Absolute: 0.3 10*3/uL (ref 0.1–1.0)
Monocytes Relative: 4 %
Neutro Abs: 7.8 10*3/uL — ABNORMAL HIGH (ref 1.7–7.7)
Neutrophils Relative %: 84 %
Platelet Count: 278 10*3/uL (ref 150–400)
RBC: 4.47 MIL/uL (ref 3.87–5.11)
RDW: 15.5 % (ref 11.5–15.5)
WBC Count: 9.3 10*3/uL (ref 4.0–10.5)
nRBC: 0 % (ref 0.0–0.2)

## 2022-08-23 LAB — CMP (CANCER CENTER ONLY)
ALT: 42 U/L (ref 0–44)
AST: 40 U/L (ref 15–41)
Albumin: 4.5 g/dL (ref 3.5–5.0)
Alkaline Phosphatase: 103 U/L (ref 38–126)
Anion gap: 11 (ref 5–15)
BUN: 10 mg/dL (ref 6–20)
CO2: 24 mmol/L (ref 22–32)
Calcium: 11 mg/dL — ABNORMAL HIGH (ref 8.9–10.3)
Chloride: 104 mmol/L (ref 98–111)
Creatinine: 0.78 mg/dL (ref 0.44–1.00)
GFR, Estimated: >60 mL/min (ref >60)
Glucose, Bld: 155 mg/dL — ABNORMAL HIGH (ref 70–99)
Potassium: 3.6 mmol/L (ref 3.5–5.1)
Sodium: 139 mmol/L (ref 135–145)
Total Bilirubin: 0.3 mg/dL (ref 0.3–1.2)
Total Protein: 7.6 g/dL (ref 6.5–8.1)

## 2022-08-23 LAB — TOTAL PROTEIN, URINE DIPSTICK: Protein, ur: <30 mg/dL — AB

## 2022-08-23 MED ORDER — SODIUM CHLORIDE 0.9 % IV SOLN
500.0000 mg/m2 | Freq: Once | INTRAVENOUS | Status: AC
  Administered 2022-08-23: 800 mg via INTRAVENOUS
  Filled 2022-08-23: qty 20

## 2022-08-23 MED ORDER — SODIUM CHLORIDE 0.9 % IV SOLN: Freq: Once | INTRAVENOUS | Status: AC

## 2022-08-23 MED ORDER — SODIUM CHLORIDE 0.9 % IV SOLN
15.0000 mg/kg | Freq: Once | INTRAVENOUS | Status: AC
  Administered 2022-08-23: 800 mg via INTRAVENOUS
  Filled 2022-08-23: qty 32

## 2022-08-23 MED ORDER — CYANOCOBALAMIN 1000 MCG/ML IJ SOLN
1000.0000 ug | Freq: Once | INTRAMUSCULAR | Status: AC
  Administered 2022-08-23: 1000 ug via INTRAMUSCULAR
  Filled 2022-08-23: qty 1

## 2022-08-23 MED ORDER — SODIUM CHLORIDE 0.9 % IV SOLN
10.0000 mg | Freq: Once | INTRAVENOUS | Status: AC
  Administered 2022-08-23: 10 mg via INTRAVENOUS
  Filled 2022-08-23: qty 10

## 2022-08-23 MED ORDER — PROCHLORPERAZINE MALEATE 10 MG PO TABS
10.0000 mg | ORAL_TABLET | Freq: Four times a day (QID) | ORAL | Status: DC | PRN
  Administered 2022-08-23: 10 mg via ORAL
  Filled 2022-08-23: qty 1

## 2022-08-23 NOTE — Patient Instructions (Signed)
[facility]   Discharge Instructions: Thank you for choosing [facility] to provide your oncology and hematology care.   If you have a lab appointment with the [facility], please go directly to the [facility] and check in at the registration area.   Wear comfortable clothing and clothing appropriate for easy access to any Portacath or PICC line.   We strive to give you quality time with your provider. You may need to reschedule your appointment if you arrive late (15 or more minutes).  Arriving late affects you and other patients whose appointments are after yours.  Also, if you miss three or more appointments without notifying the office, you may be dismissed from the clinic at the provider's discretion.      For prescription refill requests, have your pharmacy contact our office and allow 72 hours for refills to be completed.    Today you received the following chemotherapy and/or immunotherapy agents: bevacizumab-awwb and pemetrexed      To help prevent nausea and vomiting after your treatment, we encourage you to take your nausea medication as directed.  BELOW ARE SYMPTOMS THAT SHOULD BE REPORTED IMMEDIATELY: *FEVER GREATER THAN 100.4 F (38 C) OR HIGHER *CHILLS OR SWEATING *NAUSEA AND VOMITING THAT IS NOT CONTROLLED WITH YOUR NAUSEA MEDICATION *UNUSUAL SHORTNESS OF BREATH *UNUSUAL BRUISING OR BLEEDING *URINARY PROBLEMS (pain or burning when urinating, or frequent urination) *BOWEL PROBLEMS (unusual diarrhea, constipation, pain near the anus) TENDERNESS IN MOUTH AND THROAT WITH OR WITHOUT PRESENCE OF ULCERS (sore throat, sores in mouth, or a toothache) UNUSUAL RASH, SWELLING OR PAIN  UNUSUAL VAGINAL DISCHARGE OR ITCHING   Items with * indicate a potential emergency and should be followed up as soon as possible or go to the Emergency Department if any problems should occur.  Please show the CHEMOTHERAPY ALERT CARD or IMMUNOTHERAPY  ALERT CARD at check-in to the Emergency Department and triage nurse.  Should you have questions after your visit or need to cancel or reschedule your appointment, please contact [facility]  Dept: [phone]  and follow the prompts.  Office hours are 8:00 a.m. to 4:30 p.m. Monday - Friday. Please note that voicemails left after 4:00 p.m. may not be returned until the following business day.  We are closed weekends and major holidays. You have access to a nurse at all times for urgent questions. Please call the main number to the clinic Dept: [phone] and follow the prompts.   For any non-urgent questions, you may also contact your provider using MyChart. We now offer e-Visits for anyone 6 and older to request care online for non-urgent symptoms. For details visit [email or web address].   Also download the MyChart app! Go to the app store, search "MyChart", open the app, select [facility], and log in with your MyChart username and password.  Masks are optional in the cancer centers. If you would like for your care team to wear a mask while they are taking care of you, please let them know. You may have one support person who is at least 52 years old accompany you for your appointments.

## 2022-08-24 ENCOUNTER — Other Ambulatory Visit: Payer: Self-pay

## 2022-08-24 ENCOUNTER — Encounter: Payer: Self-pay | Admitting: Internal Medicine

## 2022-08-28 ENCOUNTER — Other Ambulatory Visit: Payer: Self-pay | Admitting: Sports Medicine

## 2022-09-01 ENCOUNTER — Other Ambulatory Visit: Payer: Self-pay | Admitting: Internal Medicine

## 2022-09-04 ENCOUNTER — Other Ambulatory Visit: Payer: Self-pay

## 2022-09-10 ENCOUNTER — Other Ambulatory Visit: Payer: Self-pay

## 2022-09-11 ENCOUNTER — Other Ambulatory Visit: Payer: Commercial Managed Care - PPO

## 2022-09-11 ENCOUNTER — Ambulatory Visit: Payer: Commercial Managed Care - PPO | Admitting: Internal Medicine

## 2022-09-11 ENCOUNTER — Ambulatory Visit: Payer: Commercial Managed Care - PPO

## 2022-09-11 MED FILL — Dexamethasone Sodium Phosphate Inj 100 MG/10ML: INTRAMUSCULAR | Qty: 1 | Status: AC

## 2022-09-12 ENCOUNTER — Inpatient Hospital Stay: Payer: Commercial Managed Care - PPO | Attending: Hematology and Oncology

## 2022-09-12 ENCOUNTER — Encounter: Payer: Self-pay | Admitting: Internal Medicine

## 2022-09-12 ENCOUNTER — Other Ambulatory Visit: Payer: Self-pay

## 2022-09-12 ENCOUNTER — Inpatient Hospital Stay: Payer: Commercial Managed Care - PPO

## 2022-09-12 ENCOUNTER — Other Ambulatory Visit: Payer: Self-pay | Admitting: Physician Assistant

## 2022-09-12 ENCOUNTER — Telehealth: Payer: Self-pay

## 2022-09-12 ENCOUNTER — Inpatient Hospital Stay (HOSPITAL_BASED_OUTPATIENT_CLINIC_OR_DEPARTMENT_OTHER): Payer: Commercial Managed Care - PPO | Admitting: Internal Medicine

## 2022-09-12 VITALS — BP 158/85 | HR 87 | Temp 97.8°F | Resp 17 | Wt 118.4 lb

## 2022-09-12 DIAGNOSIS — Z79899 Other long term (current) drug therapy: Secondary | ICD-10-CM | POA: Diagnosis not present

## 2022-09-12 DIAGNOSIS — Z87891 Personal history of nicotine dependence: Secondary | ICD-10-CM | POA: Diagnosis not present

## 2022-09-12 DIAGNOSIS — Z5112 Encounter for antineoplastic immunotherapy: Secondary | ICD-10-CM | POA: Insufficient documentation

## 2022-09-12 DIAGNOSIS — R5383 Other fatigue: Secondary | ICD-10-CM

## 2022-09-12 DIAGNOSIS — C3491 Malignant neoplasm of unspecified part of right bronchus or lung: Secondary | ICD-10-CM | POA: Diagnosis not present

## 2022-09-12 DIAGNOSIS — Z5111 Encounter for antineoplastic chemotherapy: Secondary | ICD-10-CM | POA: Insufficient documentation

## 2022-09-12 DIAGNOSIS — M329 Systemic lupus erythematosus, unspecified: Secondary | ICD-10-CM | POA: Insufficient documentation

## 2022-09-12 DIAGNOSIS — R7989 Other specified abnormal findings of blood chemistry: Secondary | ICD-10-CM

## 2022-09-12 DIAGNOSIS — C3411 Malignant neoplasm of upper lobe, right bronchus or lung: Secondary | ICD-10-CM | POA: Diagnosis present

## 2022-09-12 DIAGNOSIS — Z7952 Long term (current) use of systemic steroids: Secondary | ICD-10-CM | POA: Diagnosis not present

## 2022-09-12 LAB — CBC WITH DIFFERENTIAL (CANCER CENTER ONLY)
Abs Immature Granulocytes: 0.02 10*3/uL (ref 0.00–0.07)
Basophils Absolute: 0 10*3/uL (ref 0.0–0.1)
Basophils Relative: 0 %
Eosinophils Absolute: 0 10*3/uL (ref 0.0–0.5)
Eosinophils Relative: 0 %
HCT: 44.5 % (ref 36.0–46.0)
Hemoglobin: 14.9 g/dL (ref 12.0–15.0)
Immature Granulocytes: 0 %
Lymphocytes Relative: 7 %
Lymphs Abs: 0.5 10*3/uL — ABNORMAL LOW (ref 0.7–4.0)
MCH: 32.6 pg (ref 26.0–34.0)
MCHC: 33.5 g/dL (ref 30.0–36.0)
MCV: 97.4 fL (ref 80.0–100.0)
Monocytes Absolute: 0.2 10*3/uL (ref 0.1–1.0)
Monocytes Relative: 3 %
Neutro Abs: 6.4 10*3/uL (ref 1.7–7.7)
Neutrophils Relative %: 90 %
Platelet Count: 220 10*3/uL (ref 150–400)
RBC: 4.57 MIL/uL (ref 3.87–5.11)
RDW: 15.5 % (ref 11.5–15.5)
WBC Count: 7.2 10*3/uL (ref 4.0–10.5)
nRBC: 0 % (ref 0.0–0.2)

## 2022-09-12 LAB — CMP (CANCER CENTER ONLY)
ALT: 25 U/L (ref 0–44)
AST: 26 U/L (ref 15–41)
Albumin: 4.4 g/dL (ref 3.5–5.0)
Alkaline Phosphatase: 104 U/L (ref 38–126)
Anion gap: 10 (ref 5–15)
BUN: 9 mg/dL (ref 6–20)
CO2: 26 mmol/L (ref 22–32)
Calcium: 9.4 mg/dL (ref 8.9–10.3)
Chloride: 105 mmol/L (ref 98–111)
Creatinine: 0.68 mg/dL (ref 0.44–1.00)
GFR, Estimated: >60 mL/min (ref >60)
Glucose, Bld: 107 mg/dL — ABNORMAL HIGH (ref 70–99)
Potassium: 4 mmol/L (ref 3.5–5.1)
Sodium: 141 mmol/L (ref 135–145)
Total Bilirubin: 0.4 mg/dL (ref 0.3–1.2)
Total Protein: 7.6 g/dL (ref 6.5–8.1)

## 2022-09-12 LAB — TOTAL PROTEIN, URINE DIPSTICK: Protein, ur: 30 mg/dL — AB

## 2022-09-12 LAB — TSH: TSH: 0.26 u[IU]/mL — ABNORMAL LOW (ref 0.350–4.500)

## 2022-09-12 MED ORDER — SODIUM CHLORIDE 0.9 % IV SOLN: Freq: Once | INTRAVENOUS | Status: AC

## 2022-09-12 MED ORDER — PROCHLORPERAZINE MALEATE 10 MG PO TABS
10.0000 mg | ORAL_TABLET | Freq: Once | ORAL | Status: AC
  Administered 2022-09-12: 10 mg via ORAL
  Filled 2022-09-12: qty 1

## 2022-09-12 MED ORDER — CYANOCOBALAMIN 1000 MCG/ML IJ SOLN
1000.0000 ug | Freq: Once | INTRAMUSCULAR | Status: AC
  Administered 2022-09-12: 1000 ug via INTRAMUSCULAR
  Filled 2022-09-12: qty 1

## 2022-09-12 MED ORDER — SODIUM CHLORIDE 0.9 % IV SOLN
10.0000 mg | Freq: Once | INTRAVENOUS | Status: AC
  Administered 2022-09-12: 10 mg via INTRAVENOUS
  Filled 2022-09-12: qty 10

## 2022-09-12 MED ORDER — SODIUM CHLORIDE 0.9 % IV SOLN
15.0000 mg/kg | Freq: Once | INTRAVENOUS | Status: AC
  Administered 2022-09-12: 800 mg via INTRAVENOUS
  Filled 2022-09-12: qty 32

## 2022-09-12 MED ORDER — SODIUM CHLORIDE 0.9 % IV SOLN
500.0000 mg/m2 | Freq: Once | INTRAVENOUS | Status: AC
  Administered 2022-09-12: 800 mg via INTRAVENOUS
  Filled 2022-09-12: qty 20

## 2022-09-12 NOTE — Telephone Encounter (Signed)
-----   Message from [place], PA-C sent at 09/12/2022  3:39 PM EDT ----- Ok I added TSH and T4 for her next lab draw. Please let her know.  ----- Message ----- From: Interface, [facility] Sent: 09/12/2022   2:13 PM EDT To: Tobe Sos Heilingoetter, PA-C

## 2022-09-12 NOTE — Progress Notes (Signed)
[facility] [phone]   [phone]  OFFICE PROGRESS NOTE  Cyndi Bender, PA-C [place] [address]  DIAGNOSIS:  Stage IV (T4, N2, M1 a) non-small cell lung cancer, adenocarcinoma presented with multifocal disease involving the right upper lobe, right lower lobe as well as suspicious lower paratracheal lymphadenopathy and groundglass nodules in the left lung diagnosed in May 2022. The patient had molecular studies performed by [facility] and that showed no detectable mutation but this is likely secondary to low-level circulating free tumor DNA.  PRIOR THERAPY: None.  CURRENT THERAPY: palliative systemic chemotherapy with carboplatin for AUC of 5, Alimta 500 Mg/M2 and Avastin 15 Mg/KG every 3 weeks.  The patient is not a great candidate for immunotherapy because of her history of active systemic lupus erythematosus.  Starting from cycle #7 the patient is on maintenance treatment with Alimta and Avastin every 3 weeks.  She is status post 21 cycles.  INTERVAL HISTORY: Kirsten Williams 51 y.o. female returns to the clinic today for follow-up visit.  The patient is feeling fine today with no concerning complaints.  She denied having any chest pain, shortness of breath, cough or hemoptysis.  She has no nausea, vomiting, diarrhea or constipation.  She has no headache or visual changes.  She denied having any recent weight loss or night sweats.  She continues to tolerate her maintenance treatment with Alimta and Avastin fairly well.  She is here today for evaluation before starting cycle #22.   MEDICAL HISTORY: Past Medical History:  Diagnosis Date   Adenocarcinoma, lung, right (Kirsten Williams) 05/03/2021   Kirsten Williams is a 51 y.o. female with a history of lung nodules dating back to 2019 who is referred in consultation with Cyndi Bender, PA-C for assessment and management. She is a former smoker having quit in 2016. To date, nodules have been  stable as well as likely adrenal adenomas. She missed her screening CT last year and imaging was ordered last month. CT chest from 02-16-2021 reveals pro   Anxiety    GERD (gastroesophageal reflux disease)    SLE (systemic lupus erythematosus) ([facility]) 05/03/2021   SLE (systemic lupus erythematosus) ([facility]) 05/03/2021    ALLERGIES:  is allergic to clindamycin/lincomycin and carboplatin.  MEDICATIONS:  Current Outpatient Medications  Medication Sig Dispense Refill   B Complex Vitamins (B COMPLEX 50 PO) Take by mouth.     Calcium Carbonate-Vit D-Min (CALCIUM 1200 PO) Take 1 capsule by mouth daily.     cholecalciferol (VITAMIN D3) 25 MCG (1000 UNIT) tablet Take 2,000 Units by mouth daily.     cyclobenzaprine (FLEXERIL) 10 MG tablet Take 10 mg by mouth at bedtime as needed for muscle spasms.     dexamethasone (DECADRON) 4 MG tablet 4 mg p.o. twice daily the day before, day of and day after the chemotherapy every 3 weeks. 40 tablet 2   dicyclomine (BENTYL) 10 MG capsule Take 10 mg by mouth every 6 (six) hours as needed. (Patient not taking: Reported on 02/28/2022)     diphenhydrAMINE (BENADRYL) 25 MG tablet Take 25 mg by mouth daily as needed for allergies.     famotidine (PEPCID) 20 MG tablet Take 20 mg by mouth at bedtime.     folic acid (FOLVITE) 1 MG tablet TAKE 1 TABLET(1 MG) BY MOUTH DAILY 30 tablet 4   gabapentin (NEURONTIN) 100 MG capsule Take 100 mg by mouth 3 (three) times daily.     hydrocortisone  1 % lotion Apply 1 application topically 2 (two) times daily. 118 mL 0   hydroxychloroquine (PLAQUENIL) 200 MG tablet Take 200 mg by mouth 2 (two) times daily.     Multiple Vitamins-Minerals (MULTI ADULT GUMMIES) CHEW Chew 2 tablets by mouth daily.     omeprazole (PRILOSEC) 40 MG capsule Take 40 mg by mouth daily as needed (acid reflux).     oxyCODONE-acetaminophen (PERCOCET/ROXICET) 5-325 MG tablet Take 1 tablet by mouth every 8 (eight) hours as needed for severe pain. 30 tablet 0   Probiotic  Product (PROBIOTIC-10 PO) Take by mouth.     prochlorperazine (COMPAZINE) 10 MG tablet Take 1 tablet (10 mg total) by mouth every 6 (six) hours as needed. (Patient not taking: Reported on 02/28/2022) 30 tablet 2   rizatriptan (MAXALT-MLT) 10 MG disintegrating tablet Take 10 mg by mouth 2 (two) times daily as needed.     tetrahydrozoline 0.05 % ophthalmic solution Place 1 drop into both eyes once. Systane Eye Drops once daily both eyes     triamcinolone ointment (KENALOG) 0.5 % Apply topically 3 (three) times daily.     No current facility-administered medications for this visit.    SURGICAL HISTORY:  Past Surgical History:  Procedure Laterality Date   ABDOMINAL HYSTERECTOMY     BRONCHIAL BIOPSY  04/24/2021   Procedure: BRONCHIAL BIOPSIES;  Surgeon: Collene Gobble, MD;  Location: [facility] ENDOSCOPY;  Service: Pulmonary;;   BRONCHIAL BRUSHINGS  04/24/2021   Procedure: BRONCHIAL BRUSHINGS;  Surgeon: Collene Gobble, MD;  Location: [facility] ENDOSCOPY;  Service: Pulmonary;;   BRONCHIAL NEEDLE ASPIRATION BIOPSY  04/24/2021   Procedure: BRONCHIAL NEEDLE ASPIRATION BIOPSIES;  Surgeon: Collene Gobble, MD;  Location: [facility];  Service: Pulmonary;;   BRONCHIAL WASHINGS  04/24/2021   Procedure: BRONCHIAL WASHINGS;  Surgeon: Collene Gobble, MD;  Location: [facility];  Service: Pulmonary;;   CESAREAN SECTION  11/09/1990   DILATION AND CURETTAGE OF UTERUS Bilateral 09/09/1996   FIDUCIAL MARKER PLACEMENT  04/24/2021   Procedure: FIDUCIAL MARKER PLACEMENT;  Surgeon: Collene Gobble, MD;  Location: [facility] ENDOSCOPY;  Service: Pulmonary;;   TONSILLECTOMY  12/10/1977   VIDEO BRONCHOSCOPY WITH ENDOBRONCHIAL NAVIGATION Bilateral 04/24/2021   Procedure: VIDEO BRONCHOSCOPY WITH ENDOBRONCHIAL NAVIGATION;  Surgeon: Collene Gobble, MD;  Location: [facility] ENDOSCOPY;  Service: Pulmonary;  Laterality: Bilateral;    REVIEW OF SYSTEMS:  A comprehensive review of systems was negative.   PHYSICAL EXAMINATION: General appearance:  alert, cooperative, and no distress Head: Normocephalic, without obvious abnormality, atraumatic Neck: no adenopathy, no JVD, supple, symmetrical, trachea midline, and thyroid not enlarged, symmetric, no tenderness/mass/nodules Lymph nodes: Cervical, supraclavicular, and axillary nodes normal. Resp: clear to auscultation bilaterally Back: symmetric, no curvature. ROM normal. No CVA tenderness. Cardio: regular rate and rhythm, S1, S2 normal, no murmur, click, rub or gallop GI: soft, non-tender; bowel sounds normal; no masses,  no organomegaly Extremities: extremities normal, atraumatic, no cyanosis or edema  ECOG PERFORMANCE STATUS: 1 - Symptomatic but completely ambulatory  Blood pressure (!) 158/85, pulse 87, temperature 97.8 F (36.6 C), temperature source Oral, resp. rate 17, weight 118 lb 7 oz (53.7 kg), SpO2 97 %.  LABORATORY DATA: Lab Results  Component Value Date   WBC 7.2 09/12/2022   HGB 14.9 09/12/2022   HCT 44.5 09/12/2022   MCV 97.4 09/12/2022   PLT 220 09/12/2022      Chemistry      Component Value Date/Time   NA 141 09/12/2022 1008   NA 140 03/13/2021 0000  K 4.0 09/12/2022 1008   CL 105 09/12/2022 1008   CO2 26 09/12/2022 1008   BUN 9 09/12/2022 1008   BUN 10 03/13/2021 0000   CREATININE 0.68 09/12/2022 1008   GLU 107 03/13/2021 0000      Component Value Date/Time   CALCIUM 9.4 09/12/2022 1008   ALKPHOS 104 09/12/2022 1008   AST 26 09/12/2022 1008   ALT 25 09/12/2022 1008   BILITOT 0.4 09/12/2022 1008       RADIOGRAPHIC STUDIES: CT Chest W Contrast  Result Date: 08/20/2022 CLINICAL DATA:  Stage IV non-small cell lung cancer (adenocarcinoma) on palliative systemic chemotherapy. Restaging. * Tracking Code: BO * EXAM: CT CHEST, ABDOMEN, AND PELVIS WITH CONTRAST TECHNIQUE: Multidetector CT imaging of the chest, abdomen and pelvis was performed following the standard protocol during bolus administration of intravenous contrast. RADIATION DOSE REDUCTION:  This exam was performed according to the departmental dose-optimization program which includes automated exposure control, adjustment of the mA and/or kV according to patient size and/or use of iterative reconstruction technique. CONTRAST:  105mL OMNIPAQUE IOHEXOL 300 MG/ML  SOLN COMPARISON:  05/28/2022 CT chest, abdomen and pelvis. FINDINGS: CT CHEST FINDINGS Cardiovascular: Normal heart size. No significant pericardial effusion/thickening. Atherosclerotic nonaneurysmal thoracic aorta. Normal caliber pulmonary arteries. No central pulmonary emboli. Mediastinum/Nodes: No discrete thyroid nodules. Unremarkable esophagus. No axillary adenopathy. Mildly enlarged 1.0 cm right paratracheal node (series 2/image 19), stable. No new pathologically enlarged mediastinal nodes. No hilar adenopathy. Lungs/Pleura: No pneumothorax. No pleural effusion. No acute consolidative airspace disease. Part solid 2.1 x 1.3 cm superior segment right lower lobe nodule (series 4/image 47), previously 2.1 x 1.3 cm using similar measurement technique, stable. Part solid 1.3 x 0.9 cm posterior right upper lobe nodule (series 4/image 23), previously 1.3 x 0.9 cm, stable. Ground-glass 2.1 x 1.4 cm peripheral left upper lobe nodule (series 4/image 28), previously 2.1 x 1.4 cm, stable. A few scattered small solid pulmonary nodules in both lungs, largest 0.4 cm in the anterior left lower lobe (series 4/image 106), all stable. No new significant pulmonary nodules. Musculoskeletal: No aggressive appearing focal osseous lesions. Minimal thoracic spondylosis. CT ABDOMEN PELVIS FINDINGS Hepatobiliary: Normal liver with no liver mass. Normal gallbladder with no radiopaque cholelithiasis. No biliary ductal dilatation. Pancreas: Normal, with no mass or duct dilation. Spleen: Normal size. No mass. Adrenals/Urinary Tract: Stable right adrenal 2.2 cm nodule with density 49 HU and stable 1.8 cm left adrenal nodule with density 44 HU, previously characterized as  benign adenomas. Normal kidneys with no hydronephrosis and no renal mass. Normal bladder. Stomach/Bowel: Normal non-distended stomach. Normal caliber small bowel with no small bowel wall thickening. Normal appendix. Oral contrast transits to the distal colon. Normal large bowel with no diverticulosis, large bowel wall thickening or pericolonic fat stranding. Vascular/Lymphatic: Atherosclerotic nonaneurysmal abdominal aorta. Patent portal, splenic, hepatic and renal veins. No pathologically enlarged lymph nodes in the abdomen or pelvis. Reproductive: Status post hysterectomy, with no abnormal findings at the vaginal cuff. No adnexal mass. Other: No pneumoperitoneum, ascites or focal fluid collection. Musculoskeletal: No aggressive appearing focal osseous lesions. Mild lumbar spondylosis. IMPRESSION: 1. Stable CT exam. Part-solid, groundglass and tiny solid bilateral pulmonary nodules are all stable. 2. Mild right paratracheal lymphadenopathy is stable. 3. No new or progressive metastatic disease in the chest, abdomen or pelvis. 4. Stable bilateral adrenal adenomas, for which no follow-up imaging is recommended. 5. Aortic Atherosclerosis (ICD10-I70.0). Electronically Signed   By: Ilona Sorrel M.D.   On: 08/20/2022 12:27   CT Abdomen  Pelvis W Contrast  Result Date: 08/20/2022 CLINICAL DATA:  Stage IV non-small cell lung cancer (adenocarcinoma) on palliative systemic chemotherapy. Restaging. * Tracking Code: BO * EXAM: CT CHEST, ABDOMEN, AND PELVIS WITH CONTRAST TECHNIQUE: Multidetector CT imaging of the chest, abdomen and pelvis was performed following the standard protocol during bolus administration of intravenous contrast. RADIATION DOSE REDUCTION: This exam was performed according to the departmental dose-optimization program which includes automated exposure control, adjustment of the mA and/or kV according to patient size and/or use of iterative reconstruction technique. CONTRAST:  59mL OMNIPAQUE IOHEXOL 300  MG/ML  SOLN COMPARISON:  05/28/2022 CT chest, abdomen and pelvis. FINDINGS: CT CHEST FINDINGS Cardiovascular: Normal heart size. No significant pericardial effusion/thickening. Atherosclerotic nonaneurysmal thoracic aorta. Normal caliber pulmonary arteries. No central pulmonary emboli. Mediastinum/Nodes: No discrete thyroid nodules. Unremarkable esophagus. No axillary adenopathy. Mildly enlarged 1.0 cm right paratracheal node (series 2/image 19), stable. No new pathologically enlarged mediastinal nodes. No hilar adenopathy. Lungs/Pleura: No pneumothorax. No pleural effusion. No acute consolidative airspace disease. Part solid 2.1 x 1.3 cm superior segment right lower lobe nodule (series 4/image 47), previously 2.1 x 1.3 cm using similar measurement technique, stable. Part solid 1.3 x 0.9 cm posterior right upper lobe nodule (series 4/image 23), previously 1.3 x 0.9 cm, stable. Ground-glass 2.1 x 1.4 cm peripheral left upper lobe nodule (series 4/image 28), previously 2.1 x 1.4 cm, stable. A few scattered small solid pulmonary nodules in both lungs, largest 0.4 cm in the anterior left lower lobe (series 4/image 106), all stable. No new significant pulmonary nodules. Musculoskeletal: No aggressive appearing focal osseous lesions. Minimal thoracic spondylosis. CT ABDOMEN PELVIS FINDINGS Hepatobiliary: Normal liver with no liver mass. Normal gallbladder with no radiopaque cholelithiasis. No biliary ductal dilatation. Pancreas: Normal, with no mass or duct dilation. Spleen: Normal size. No mass. Adrenals/Urinary Tract: Stable right adrenal 2.2 cm nodule with density 49 HU and stable 1.8 cm left adrenal nodule with density 44 HU, previously characterized as benign adenomas. Normal kidneys with no hydronephrosis and no renal mass. Normal bladder. Stomach/Bowel: Normal non-distended stomach. Normal caliber small bowel with no small bowel wall thickening. Normal appendix. Oral contrast transits to the distal colon. Normal  large bowel with no diverticulosis, large bowel wall thickening or pericolonic fat stranding. Vascular/Lymphatic: Atherosclerotic nonaneurysmal abdominal aorta. Patent portal, splenic, hepatic and renal veins. No pathologically enlarged lymph nodes in the abdomen or pelvis. Reproductive: Status post hysterectomy, with no abnormal findings at the vaginal cuff. No adnexal mass. Other: No pneumoperitoneum, ascites or focal fluid collection. Musculoskeletal: No aggressive appearing focal osseous lesions. Mild lumbar spondylosis. IMPRESSION: 1. Stable CT exam. Part-solid, groundglass and tiny solid bilateral pulmonary nodules are all stable. 2. Mild right paratracheal lymphadenopathy is stable. 3. No new or progressive metastatic disease in the chest, abdomen or pelvis. 4. Stable bilateral adrenal adenomas, for which no follow-up imaging is recommended. 5. Aortic Atherosclerosis (ICD10-I70.0). Electronically Signed   By: Ilona Sorrel M.D.   On: 08/20/2022 12:27   NCV with EMG(electromyography)  Result Date: 08/14/2022 Alda Berthold, DO     08/14/2022 12:54 PM [facility] [address]  [place], [place] [address] Tel: [phone] Fax:  [phone] Test Date:  08/14/2022 Patient: Kirsten Williams DOB: 1971-07-18 Physician: Narda Amber, DO Sex: Female Height: 5\' 1"  Ref Phys: Teresa Coombs, DO ID#: [id number]   Technician:  Patient Complaints: This is a 51 year old female referred for evaluation of bilateral leg pain, worse on the right. NCV & EMG Findings: Extensive  electrodiagnostic testing of the right lower extremity and additional studies of the left shows: Bilateral sural and superficial peroneal sensory responses are within normal limits, however, right superficial peroneal sensory response is asymmetrically reduced as compared to the left. Right peroneal motor response at the extensor digitorum brevis is reduced (1.8 mV), and normal at the tibialis anterior.  Right tibial motor response also  shows reduced amplitude (2.8 mV).  Left peroneal and tibial motor responses are within normal limits. Bilateral tibial H reflex studies are within normal limits. Chronic motor axonal loss changes are seen affecting the right anterior tibialis and flexor digitorum longus muscles, without accompanied active denervation. Impression: Chronic L5 radiculopathy affecting the right lower extremity, mild. There is no evidence of a large fiber sensorimotor polyneuropathy affecting the lower extremities. ___________________________ Narda Amber, DO Nerve Conduction Studies Anti Sensory Summary Table  Stim Site NR Peak (ms) Norm Peak (ms) O-P Amp (V) Norm O-P Amp Left Sup Peroneal Anti Sensory (Ant Lat Mall)  32C 12 cm    2.5 <4.6 18.4 >4 Right Sup Peroneal Anti Sensory (Ant Lat Mall)  32C 12 cm    2.9 <4.6 4.3 >4 Left Sural Anti Sensory (Lat Mall)  32C Calf    4.3 <4.6 7.2 >4 Right Sural Anti Sensory (Lat Mall)  32C Calf    3.5 <4.6 7.6 >4 Motor Summary Table  Stim Site NR Onset (ms) Norm Onset (ms) O-P Amp (mV) Norm O-P Amp Site1 Site2 Delta-0 (ms) Dist (cm) Vel (m/s) Norm Vel (m/s) Left Peroneal Motor (Ext Dig Brev)  32C Ankle    4.3 <6.0 2.7 >2.5 B Fib Ankle 5.9 31.0 53 >40 B Fib    10.2  2.3  Poplt B Fib 1.7 8.0 47 >40 Poplt    11.9  2.2        Right Peroneal Motor (Ext Dig Brev)  32C Ankle    5.1 <6.0 1.8 >2.5 B Fib Ankle 6.9 32.0 46 >40 B Fib    12.0  1.3  Poplt B Fib 1.4 8.0 57 >40 Poplt    13.4  1.3        Right Peroneal TA Motor (Tib Ant)  32C Fib Head    2.6 <4.5 6.7 >3 Poplit Fib Head 1.2 7.0 58 >40 Poplit    3.8  6.6        Left Tibial Motor (Abd Hall Brev)  32C Ankle    4.8 <6.0 5.5 >4 Knee Ankle 7.5 38.0 51 >40 Knee    12.3  4.5        Right Tibial Motor (Abd Hall Brev)  32C Ankle    4.0 <6.0 2.8 >4 Knee Ankle 7.0 39.0 56 >40 Knee    11.0  1.9        H Reflex Studies  NR H-Lat (ms) Lat Norm (ms) L-R H-Lat (ms) Left Tibial (Gastroc)  32C    30.61 <35 0.00 Right Tibial (Gastroc)  32C    30.61 <35 0.00  EMG  Side Muscle Ins Act Fibs Fasc Recrt Dur. Amp. Poly. Activation Comment Right AntTibialis Nml Nml Nml 1- 1+ 1+ 1+ Nml N/A Right Gastroc Nml Nml Nml Nml Nml Nml Nml Nml N/A Right Flex Dig Long Nml Nml Nml 1- 1+ 1+ 1+ Nml N/A Right RectFemoris Nml Nml Nml Nml Nml Nml Nml Nml N/A Right GluteusMed Nml Nml Nml Nml Nml Nml Nml Nml N/A Right BicepsFemS Nml Nml Nml Nml Nml Nml Nml Nml N/A Left AntTibialis Nml Nml Nml Nml  Nml Nml Nml Nml N/A Left Gastroc Nml Nml Nml Nml Nml Nml Nml Nml N/A Left Flex Dig Long Nml Nml Nml Nml Nml Nml Nml Nml N/A Left RectFemoris Nml Nml Nml Nml Nml Nml Nml Nml N/A Waveforms:               ASSESSMENT AND PLAN:  This is a very pleasant 51 years old white female recently diagnosed with a stage IV non-small cell lung cancer, adenocarcinoma with no actionable mutation diagnosed in May 2022.  The patient has also history of systemic lupus erythematosus and she is not a candidate for immunotherapy. She is currently undergoing systemic chemotherapy with carboplatin for AUC of 5, Alimta 500 Mg/M2 and Avastin 15 Mg/KG every 3 weeks status post 21 cycles.  Starting from cycle #7 the patient is on maintenance treatment with Alimta and Avastin every 3 weeks. The patient has been tolerating her maintenance therapy fairly well with no concerning adverse effects. I recommended for her to proceed with cycle #22 today as planned. I will see her back for follow-up visit in 3 weeks for evaluation before the next cycle of her treatment. She was advised to call immediately if she has any other concerning symptoms in the interval. The patient voices understanding of current disease status and treatment options and is in agreement with the current care plan.  All questions were answered. The patient knows to call the clinic with any problems, questions or concerns. We can certainly see the patient much sooner if necessary.  Disclaimer: This note was dictated with voice recognition software. Similar  sounding words can inadvertently be transcribed and may not be corrected upon review.

## 2022-09-12 NOTE — Telephone Encounter (Signed)
I attempted to call the pt. Her VM box is full. I was unable to leave a message.

## 2022-09-13 NOTE — Telephone Encounter (Signed)
I attempted again to call the pt. Her VM box is full.

## 2022-09-21 ENCOUNTER — Other Ambulatory Visit: Payer: Self-pay

## 2022-09-26 ENCOUNTER — Telehealth: Payer: Self-pay | Admitting: Internal Medicine

## 2022-09-26 NOTE — Telephone Encounter (Signed)
Called to speak with patient's husband regarding upcoming appointments, patient will be notified.

## 2022-09-26 NOTE — Telephone Encounter (Signed)
Called patient regarding all upcoming appointments, patient's voicemail is full. Calender will be mailed, I will attempt to contact again.

## 2022-10-02 MED FILL — Dexamethasone Sodium Phosphate Inj 100 MG/10ML: INTRAMUSCULAR | Qty: 1 | Status: AC

## 2022-10-03 ENCOUNTER — Encounter: Payer: Self-pay | Admitting: Internal Medicine

## 2022-10-03 ENCOUNTER — Inpatient Hospital Stay (HOSPITAL_BASED_OUTPATIENT_CLINIC_OR_DEPARTMENT_OTHER): Payer: Commercial Managed Care - PPO | Admitting: Internal Medicine

## 2022-10-03 ENCOUNTER — Inpatient Hospital Stay: Payer: Commercial Managed Care - PPO

## 2022-10-03 ENCOUNTER — Other Ambulatory Visit: Payer: Self-pay

## 2022-10-03 VITALS — BP 152/86 | HR 93 | Temp 97.7°F | Resp 17

## 2022-10-03 DIAGNOSIS — C3491 Malignant neoplasm of unspecified part of right bronchus or lung: Secondary | ICD-10-CM

## 2022-10-03 DIAGNOSIS — Z5112 Encounter for antineoplastic immunotherapy: Secondary | ICD-10-CM | POA: Diagnosis not present

## 2022-10-03 LAB — CBC WITH DIFFERENTIAL (CANCER CENTER ONLY)
Abs Immature Granulocytes: 0.03 10*3/uL (ref 0.00–0.07)
Basophils Absolute: 0 10*3/uL (ref 0.0–0.1)
Basophils Relative: 0 %
Eosinophils Absolute: 0 10*3/uL (ref 0.0–0.5)
Eosinophils Relative: 0 %
HCT: 41.7 % (ref 36.0–46.0)
Hemoglobin: 13.8 g/dL (ref 12.0–15.0)
Immature Granulocytes: 0 %
Lymphocytes Relative: 8 %
Lymphs Abs: 0.8 10*3/uL (ref 0.7–4.0)
MCH: 32.3 pg (ref 26.0–34.0)
MCHC: 33.1 g/dL (ref 30.0–36.0)
MCV: 97.7 fL (ref 80.0–100.0)
Monocytes Absolute: 0.5 10*3/uL (ref 0.1–1.0)
Monocytes Relative: 5 %
Neutro Abs: 7.7 10*3/uL (ref 1.7–7.7)
Neutrophils Relative %: 87 %
Platelet Count: 257 10*3/uL (ref 150–400)
RBC: 4.27 MIL/uL (ref 3.87–5.11)
RDW: 15 % (ref 11.5–15.5)
WBC Count: 8.9 10*3/uL (ref 4.0–10.5)
nRBC: 0 % (ref 0.0–0.2)

## 2022-10-03 LAB — CMP (CANCER CENTER ONLY)
ALT: 19 U/L (ref 0–44)
AST: 20 U/L (ref 15–41)
Albumin: 4.1 g/dL (ref 3.5–5.0)
Alkaline Phosphatase: 105 U/L (ref 38–126)
Anion gap: 10 (ref 5–15)
BUN: 6 mg/dL (ref 6–20)
CO2: 26 mmol/L (ref 22–32)
Calcium: 9.4 mg/dL (ref 8.9–10.3)
Chloride: 103 mmol/L (ref 98–111)
Creatinine: 0.72 mg/dL (ref 0.44–1.00)
GFR, Estimated: >60 mL/min (ref >60)
Glucose, Bld: 151 mg/dL — ABNORMAL HIGH (ref 70–99)
Potassium: 3.7 mmol/L (ref 3.5–5.1)
Sodium: 139 mmol/L (ref 135–145)
Total Bilirubin: 0.4 mg/dL (ref 0.3–1.2)
Total Protein: 7.4 g/dL (ref 6.5–8.1)

## 2022-10-03 LAB — TOTAL PROTEIN, URINE DIPSTICK: Protein, ur: <30 mg/dL

## 2022-10-03 MED ORDER — SODIUM CHLORIDE 0.9 % IV SOLN
15.0000 mg/kg | Freq: Once | INTRAVENOUS | Status: AC
  Administered 2022-10-03: 800 mg via INTRAVENOUS
  Filled 2022-10-03: qty 32

## 2022-10-03 MED ORDER — SODIUM CHLORIDE 0.9 % IV SOLN: Freq: Once | INTRAVENOUS | Status: AC

## 2022-10-03 MED ORDER — SODIUM CHLORIDE 0.9 % IV SOLN
10.0000 mg | Freq: Once | INTRAVENOUS | Status: AC
  Administered 2022-10-03: 10 mg via INTRAVENOUS
  Filled 2022-10-03: qty 10

## 2022-10-03 MED ORDER — SODIUM CHLORIDE 0.9 % IV SOLN
500.0000 mg/m2 | Freq: Once | INTRAVENOUS | Status: AC
  Administered 2022-10-03: 800 mg via INTRAVENOUS
  Filled 2022-10-03: qty 12

## 2022-10-03 MED ORDER — PROCHLORPERAZINE MALEATE 10 MG PO TABS
10.0000 mg | ORAL_TABLET | Freq: Once | ORAL | Status: AC
  Administered 2022-10-03: 10 mg via ORAL
  Filled 2022-10-03: qty 1

## 2022-10-03 NOTE — Progress Notes (Signed)
[facility] [phone]   [phone]  OFFICE PROGRESS NOTE  Cyndi Bender, PA-C [place] [address]  DIAGNOSIS:  Stage IV (T4, N2, M1 a) non-small cell lung cancer, adenocarcinoma presented with multifocal disease involving the right upper lobe, right lower lobe as well as suspicious lower paratracheal lymphadenopathy and groundglass nodules in the left lung diagnosed in May 2022. The patient had molecular studies performed by [facility] [employer] and that showed no detectable mutation but this is likely secondary to low-level circulating free tumor DNA.  PRIOR THERAPY: None.  CURRENT THERAPY: palliative systemic chemotherapy with carboplatin for AUC of 5, Alimta 500 Mg/M2 and Avastin 15 Mg/KG every 3 weeks.  The patient is not a great candidate for immunotherapy because of her history of active systemic lupus erythematosus.  Starting from cycle #7 the patient is on maintenance treatment with Alimta and Avastin every 3 weeks.  She is status post 22 cycles.  INTERVAL HISTORY: Kirsten Williams 51 y.o. female returns to the clinic today for follow-up visit.  The patient is feeling fine today with no concerning complaints except for occasional aching pain.  She denied having any current chest pain, shortness of breath, cough or hemoptysis.  She has no nausea, vomiting, diarrhea or constipation.  She has no headache or visual changes.  She denied having any recent weight loss or night sweats.  She is very active.  She is here today for evaluation before starting cycle #23.   MEDICAL HISTORY: Past Medical History:  Diagnosis Date   Adenocarcinoma, lung, right ([place]) 05/03/2021   Kirsten Williams is a 51 y.o. female with a history of lung nodules dating back to 2019 who is referred in consultation with Cyndi Bender, PA-C for assessment and management. She is a former smoker having quit in 2016. To date, nodules have been stable as well as likely  adrenal adenomas. She missed her screening CT last year and imaging was ordered last month. CT chest from 02-16-2021 reveals pro   Anxiety    GERD (gastroesophageal reflux disease)    SLE (systemic lupus erythematosus) ([place]) 05/03/2021   SLE (systemic lupus erythematosus) ([place]) 05/03/2021    ALLERGIES:  is allergic to clindamycin/lincomycin and carboplatin.  MEDICATIONS:  Current Outpatient Medications  Medication Sig Dispense Refill   B Complex Vitamins (B COMPLEX 50 PO) Take by mouth.     Calcium Carbonate-Vit D-Min (CALCIUM 1200 PO) Take 1 capsule by mouth daily.     cholecalciferol (VITAMIN D3) 25 MCG (1000 UNIT) tablet Take 2,000 Units by mouth daily.     cyclobenzaprine (FLEXERIL) 10 MG tablet Take 10 mg by mouth at bedtime as needed for muscle spasms.     dexamethasone (DECADRON) 4 MG tablet 4 mg p.o. twice daily the day before, day of and day after the chemotherapy every 3 weeks. 40 tablet 2   dicyclomine (BENTYL) 10 MG capsule Take 10 mg by mouth every 6 (six) hours as needed. (Patient not taking: Reported on 02/28/2022)     diphenhydrAMINE (BENADRYL) 25 MG tablet Take 25 mg by mouth daily as needed for allergies.     famotidine (PEPCID) 20 MG tablet Take 20 mg by mouth at bedtime.     folic acid (FOLVITE) 1 MG tablet TAKE 1 TABLET(1 MG) BY MOUTH DAILY 30 tablet 4   gabapentin (NEURONTIN) 100 MG capsule Take 100 mg by mouth 3 (three) times daily. (Patient not taking: Reported on 09/12/2022)  hydrocortisone 1 % lotion Apply 1 application topically 2 (two) times daily. 118 mL 0   hydroxychloroquine (PLAQUENIL) 200 MG tablet Take 200 mg by mouth 2 (two) times daily.     Multiple Vitamins-Minerals (MULTI ADULT GUMMIES) CHEW Chew 2 tablets by mouth daily.     omeprazole (PRILOSEC) 40 MG capsule Take 40 mg by mouth daily as needed (acid reflux).     oxyCODONE-acetaminophen (PERCOCET/ROXICET) 5-325 MG tablet Take 1 tablet by mouth every 8 (eight) hours as needed for severe pain. 30 tablet  0   Probiotic Product (PROBIOTIC-10 PO) Take by mouth.     prochlorperazine (COMPAZINE) 10 MG tablet Take 1 tablet (10 mg total) by mouth every 6 (six) hours as needed. (Patient not taking: Reported on 02/28/2022) 30 tablet 2   rizatriptan (MAXALT-MLT) 10 MG disintegrating tablet Take 10 mg by mouth 2 (two) times daily as needed.     tetrahydrozoline 0.05 % ophthalmic solution Place 1 drop into both eyes once. Systane Eye Drops once daily both eyes     triamcinolone ointment (KENALOG) 0.5 % Apply topically 3 (three) times daily.     No current facility-administered medications for this visit.    SURGICAL HISTORY:  Past Surgical History:  Procedure Laterality Date   ABDOMINAL HYSTERECTOMY     BRONCHIAL BIOPSY  04/24/2021   Procedure: BRONCHIAL BIOPSIES;  Surgeon: Collene Gobble, MD;  Location: [facility] ENDOSCOPY;  Service: Pulmonary;;   BRONCHIAL BRUSHINGS  04/24/2021   Procedure: BRONCHIAL BRUSHINGS;  Surgeon: Collene Gobble, MD;  Location[facility] ENDOSCOPY;  Service: Pulmonary;;   BRONCHIAL NEEDLE ASPIRATION BIOPSY  04/24/2021   Procedure: BRONCHIAL NEEDLE ASPIRATION BIOPSIES;  Surgeon: Collene Gobble, MD;  Location: [place];  Service: Pulmonary;;   BRONCHIAL WASHINGS  04/24/2021   Procedure: BRONCHIAL WASHINGS;  Surgeon: Collene Gobble, MD;  Location: [place];  Service: Pulmonary;;   CESAREAN SECTION  11/09/1990   DILATION AND CURETTAGE OF UTERUS Bilateral 09/09/1996   FIDUCIAL MARKER PLACEMENT  04/24/2021   Procedure: FIDUCIAL MARKER PLACEMENT;  Surgeon: Collene Gobble, MD;  Location: [facility] ENDOSCOPY;  Service: Pulmonary;;   TONSILLECTOMY  12/10/1977   VIDEO BRONCHOSCOPY WITH ENDOBRONCHIAL NAVIGATION Bilateral 04/24/2021   Procedure: VIDEO BRONCHOSCOPY WITH ENDOBRONCHIAL NAVIGATION;  Surgeon: Collene Gobble, MD;  Location: [facility] ENDOSCOPY;  Service: Pulmonary;  Laterality: Bilateral;    REVIEW OF SYSTEMS:  A comprehensive review of systems was negative.   PHYSICAL EXAMINATION: General  appearance: alert, cooperative, and no distress Head: Normocephalic, without obvious abnormality, atraumatic Neck: no adenopathy, no JVD, supple, symmetrical, trachea midline, and thyroid not enlarged, symmetric, no tenderness/mass/nodules Lymph nodes: Cervical, supraclavicular, and axillary nodes normal. Resp: clear to auscultation bilaterally Back: symmetric, no curvature. ROM normal. No CVA tenderness. Cardio: regular rate and rhythm, S1, S2 normal, no murmur, click, rub or gallop GI: soft, non-tender; bowel sounds normal; no masses,  no organomegaly Extremities: extremities normal, atraumatic, no cyanosis or edema  ECOG PERFORMANCE STATUS: 1 - Symptomatic but completely ambulatory  Blood pressure (!) 158/79, pulse 85, temperature (!) 97.5 F (36.4 C), temperature source Oral, resp. rate 17, height 5\' 1"  (1.549 m), weight 120 lb 5 oz (54.6 kg), SpO2 99 %.  LABORATORY DATA: Lab Results  Component Value Date   WBC 8.9 10/03/2022   HGB 13.8 10/03/2022   HCT 41.7 10/03/2022   MCV 97.7 10/03/2022   PLT 257 10/03/2022      Chemistry      Component Value Date/Time   NA 141 09/12/2022  1008   NA 140 03/13/2021 0000   K 4.0 09/12/2022 1008   CL 105 09/12/2022 1008   CO2 26 09/12/2022 1008   BUN 9 09/12/2022 1008   BUN 10 03/13/2021 0000   CREATININE 0.68 09/12/2022 1008   GLU 107 03/13/2021 0000      Component Value Date/Time   CALCIUM 9.4 09/12/2022 1008   ALKPHOS 104 09/12/2022 1008   AST 26 09/12/2022 1008   ALT 25 09/12/2022 1008   BILITOT 0.4 09/12/2022 1008       RADIOGRAPHIC STUDIES: No results found.  ASSESSMENT AND PLAN:  This is a very pleasant 51 years old white female recently diagnosed with a stage IV non-small cell lung cancer, adenocarcinoma with no actionable mutation diagnosed in May 2022.  The patient has also history of systemic lupus erythematosus and she is not a candidate for immunotherapy. She is currently undergoing systemic chemotherapy with  carboplatin for AUC of 5, Alimta 500 Mg/M2 and Avastin 15 Mg/KG every 3 weeks status post 22 cycles.  Starting from cycle #7 the patient is on maintenance treatment with Alimta and Avastin every 3 weeks. The patient is tolerating this treatment well with no concerning adverse effects. I recommended for her to proceed with cycle #23 today as planned. I will see her back for follow-up visit in 3 weeks for evaluation before starting cycle #24. She was advised to call immediately if she has any other concerning symptoms in the interval. The patient voices understanding of current disease status and treatment options and is in agreement with the current care plan.  All questions were answered. The patient knows to call the clinic with any problems, questions or concerns. We can certainly see the patient much sooner if necessary.  Disclaimer: This note was dictated with voice recognition software. Similar sounding words can inadvertently be transcribed and may not be corrected upon review.

## 2022-10-16 ENCOUNTER — Other Ambulatory Visit: Payer: Self-pay

## 2022-10-20 NOTE — Progress Notes (Unsigned)
[place] OFFICE PROGRESS NOTE  Cyndi Bender, PA-C [place] [address]  DIAGNOSIS:  Stage IV (T4, N2, M1 a) non-small cell lung cancer, adenocarcinoma presented with multifocal disease involving the right upper lobe, right lower lobe as well as suspicious lower paratracheal lymphadenopathy and groundglass nodules in the left lung diagnosed in May 2022. The patient had molecular studies performed by [facility] and that showed no detectable mutation but this is likely secondary to low-level circulating free tumor DNA.   PRIOR THERAPY: None   CURRENT THERAPY:  Palliative systemic chemotherapy with carboplatin for AUC of 5, Alimta 500 Mg/M2 and Avastin 15 Mg/KG every 3 weeks.  The patient is not a great candidate for immunotherapy in the event that she has a genetic mutation.  She is status post 23 cycles. Starting from cycle #6, the patient will start maintenance Alimta and Avastin.   INTERVAL HISTORY: Kirsten Williams 51 y.o. female returns  to the clinic today for a follow-up visit.  The patient has several concerns today.   The patient often gets a tight sensation around her diaphragm following treatment however, last week the patient started getting severe tightness in her diaphragm, pleuritic chest pain when she takes a deep breath, and the pain radiates to her back.  Characterizes it as pressure and sharp.  The pain is particularly worse on the right side.  Reports she has been coughing more than normal.  Has any hemoptysis.  The patient does have a history of constipation and had some mild constipation around that time but states that this pain is different than her typical constipation pressure.  Pain has been interfering with her ability to sleep.  She is been taking gabapentin and oxycodone.  She needs a refill of oxycodone today.  She also needs a refill of her antiemetic.  She is also been having some more nausea than what is normal for her.  She also  had some fluid aspirated from her right ankle in the past.  She sees her orthopedic provider on Friday for Prolia and she is going to show him her ankle because she has been having some slight ankle swelling and erythema.  Approximately 2 weeks ago, patient was picking up a box and then the following day had swelling and pain in her left upper extremity, hands, and wrist.  She describes this as a "ripping pain".  She was seen in the emergency room had several studies performed it was determined that she had a tendon injury.  She then followed up with her local doctor and she has been applying ice and using a brace.  The area has several bruises the patient states the swelling has gone down significantly.  She has decreased motion in her fingertips.  Do not have access to the records from the emergency room or the imaging.  She denies any fever or chills.  She has occasional night sweats. She reports a fine appetite. She is prone to headaches due to history of migraines but did not report any changes in her headaches. She sometimes develops a rash for which her dermatologist gave her cream which controlling her rash.  He also continues to feel that her thyroid is "puffy".  She is seeing neurology for tingling in her right foot.  She is here for evaluation and repeat blood work before starting cycle #24   MEDICAL HISTORY: Past Medical History:  Diagnosis Date   Adenocarcinoma, lung, right ([place]) 05/03/2021  Kirsten Williams is a 51 y.o. female with a history of lung nodules dating back to 2019 who is referred in consultation with Cyndi Bender, PA-C for assessment and management. She is a former smoker having quit in 2016. 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 She missed her screening CT last year and imaging was ordered last month. CT chest from 02-16-2021 reveals pro   Anxiety    GERD (gastroesophageal reflux disease)    SLE (systemic lupus erythematosus) ([place]) 05/03/2021    SLE (systemic lupus erythematosus) ([place]) 05/03/2021    ALLERGIES:  is allergic to clindamycin/lincomycin and carboplatin.  MEDICATIONS:  Current Outpatient Medications  Medication Sig Dispense Refill   B Complex Vitamins (B COMPLEX 50 PO) Take by mouth.     Calcium Carbonate-Vit D-Min (CALCIUM 1200 PO) Take 1 capsule by mouth daily.     cholecalciferol (VITAMIN D3) 25 MCG (1000 UNIT) tablet Take 2,000 Units by mouth daily.     cyclobenzaprine (FLEXERIL) 10 MG tablet Take 10 mg by mouth at bedtime as needed for muscle spasms.     dexamethasone (DECADRON) 4 MG tablet 4 mg p.o. twice daily the day before, day of and day after the chemotherapy every 3 weeks. 40 tablet 2   dicyclomine (BENTYL) 10 MG capsule Take 10 mg by mouth every 6 (six) hours as needed. (Patient not taking: Reported on 02/28/2022)     diphenhydrAMINE (BENADRYL) 25 MG tablet Take 25 mg by mouth daily as needed for allergies.     famotidine (PEPCID) 20 MG tablet Take 20 mg by mouth at bedtime.     folic acid (FOLVITE) 1 MG tablet TAKE 1 TABLET(1 MG) BY MOUTH DAILY 30 tablet 4   gabapentin (NEURONTIN) 100 MG capsule Take 100 mg by mouth 3 (three) times daily. (Patient not taking: Reported on 09/12/2022)     hydrocortisone 1 % lotion Apply 1 application topically 2 (two) times daily. 118 mL 0   hydroxychloroquine (PLAQUENIL) 200 MG tablet Take 200 mg by mouth 2 (two) times daily.     Multiple Vitamins-Minerals (MULTI ADULT GUMMIES) CHEW Chew 2 tablets by mouth daily.     omeprazole (PRILOSEC) 40 MG capsule Take 40 mg by mouth daily as needed (acid reflux).     oxyCODONE-acetaminophen (PERCOCET/ROXICET) 5-325 MG tablet Take 1 tablet by mouth every 8 (eight) hours as needed for severe pain. 30 tablet 0   Probiotic Product (PROBIOTIC-10 PO) Take by mouth.     prochlorperazine (COMPAZINE) 10 MG tablet Take 1 tablet (10 mg total) by mouth every 6 (six) hours as needed. (Patient not taking: Reported on 02/28/2022) 30 tablet 2    rizatriptan (MAXALT-MLT) 10 MG disintegrating tablet Take 10 mg by mouth 2 (two) times daily as needed.     tetrahydrozoline 0.05 % ophthalmic solution Place 1 drop into both eyes once. Systane Eye Drops once daily both eyes     triamcinolone ointment (KENALOG) 0.5 % Apply topically 3 (three) times daily.     No current facility-administered medications for this visit.    SURGICAL HISTORY:  Past Surgical History:  Procedure Laterality Date   ABDOMINAL HYSTERECTOMY     BRONCHIAL BIOPSY  04/24/2021   Procedure: BRONCHIAL BIOPSIES;  Surgeon: Collene Gobble, MD;  Location: [facility] ENDOSCOPY;  Service: Pulmonary;;   BRONCHIAL BRUSHINGS  04/24/2021   Procedure: BRONCHIAL BRUSHINGS;  Surgeon: Collene Gobble, MD;  Location: [facility] ENDOSCOPY;  Service: Pulmonary;;   BRONCHIAL NEEDLE ASPIRATION BIOPSY  04/24/2021  Procedure: BRONCHIAL NEEDLE ASPIRATION BIOPSIES;  Surgeon: Collene Gobble, MD;  Location: [place];  Service: Pulmonary;;   BRONCHIAL WASHINGS  04/24/2021   Procedure: BRONCHIAL WASHINGS;  Surgeon: Collene Gobble, MD;  Location: [place];  Service: Pulmonary;;   CESAREAN SECTION  11/09/1990   DILATION AND CURETTAGE OF UTERUS Bilateral 09/09/1996   FIDUCIAL MARKER PLACEMENT  04/24/2021   Procedure: FIDUCIAL MARKER PLACEMENT;  Surgeon: Collene Gobble, MD;  Location: [facility] ENDOSCOPY;  Service: Pulmonary;;   TONSILLECTOMY  12/10/1977   VIDEO BRONCHOSCOPY WITH ENDOBRONCHIAL NAVIGATION Bilateral 04/24/2021   Procedure: VIDEO BRONCHOSCOPY WITH ENDOBRONCHIAL NAVIGATION;  Surgeon: Collene Gobble, MD;  Location: [place] ENDOSCOPY;  Service: Pulmonary;  Laterality: Bilateral;    REVIEW OF SYSTEMS:   Constitutional: Positive for fatigue 1 week following treatment. Negative for appetite change, chills, fever and unexpected weight change.  HENT: Negative for mouth sores, nosebleeds, sore throat and trouble swallowing.   Eyes: Negative for eye problems and icterus.  Respiratory: Positive for pain with deep  breath. Positive for increased cough. Positive for some increase in shortness of breath. Negative for hemoptysis and wheezing.   Cardiovascular: Positive for mild right ankle swelling. Negative for chest pain.  Gastrointestinal: Positive for intermittent constipation. Positive for increased nausea. Positive for band-like sensation around diaphragm. Negative for diarrhea.  Genitourinary: Negative for bladder incontinence, difficulty urinating, dysuria, frequency and hematuria.   Musculoskeletal:  Positive for occasional sharp shooting pain down her foot.Positive for bruising and decreased ROM of left hand. Negative for back pain, gait problem, neck pain and neck stiffness.  Skin: Negative for rash or itching. .  Neurological: Negative for dizziness, extremity weakness, gait problem, headaches, light-headedness and seizures.  Hematological: Negative for adenopathy. Does not bruise/bleed easily.  Psychiatric/Behavioral: Negative for confusion, depression and sleep disturbance. The patient is not nervous/anxious.    PHYSICAL EXAMINATION:  There were no vitals taken for this visit.  ECOG PERFORMANCE STATUS: 1  Physical Exam  Constitutional: Oriented to person, place, and time and well-developed, well-nourished, and in no distress.  HENT:  Head: Normocephalic and atraumatic.  Mouth/Throat: Oropharynx is clear and moist. No oropharyngeal exudate.  Eyes: Conjunctivae are normal. Right eye exhibits no discharge. Left eye exhibits no discharge. No scleral icterus.  Neck: Normal range of motion. Neck supple.  Cardiovascular: Normal rate, regular rhythm, normal heart sounds and intact distal pulses.   Pulmonary/Chest: Effort normal and breath sounds normal. No respiratory distress. No wheezes. No rales. Inspiration was limited due to pain.  Abdominal: Soft. Bowel sounds are normal. Exhibits no distension and no mass. There is no tenderness.  Musculoskeletal: Normal range of motion. Mild right ankle  swelling.  Lymphadenopathy:    No cervical adenopathy.  Neurological: Alert and oriented to person, place, and time. Exhibits normal muscle tone. Gait normal. Coordination normal.  Skin: Skin is warm and dry. No rash noted. Not diaphoretic. No erythema. No pallor.  Psychiatric: Mood, memory and judgment normal.  Vitals reviewed.  LABORATORY DATA: Lab Results  Component Value Date   WBC 8.9 10/03/2022   HGB 13.8 10/03/2022   HCT 41.7 10/03/2022   MCV 97.7 10/03/2022   PLT 257 10/03/2022      Chemistry      Component Value Date/Time   NA 139 10/03/2022 0935   NA 140 03/13/2021 0000   K 3.7 10/03/2022 0935   CL 103 10/03/2022 0935   CO2 26 10/03/2022 0935   BUN 6 10/03/2022 0935   BUN 10 03/13/2021 0000  CREATININE 0.72 10/03/2022 0935   GLU 107 03/13/2021 0000      Component Value Date/Time   CALCIUM 9.4 10/03/2022 0935   ALKPHOS 105 10/03/2022 0935   AST 20 10/03/2022 0935   ALT 19 10/03/2022 0935   BILITOT 0.4 10/03/2022 0935       RADIOGRAPHIC STUDIES:  No results found.   ASSESSMENT/PLAN:  This is a very pleasant 51 year old Caucasian female diagnosed with stage IV (T4, N2, M1 a) non-small cell lung cancer, adenocarcinoma presented with multifocal disease involving the right upper lobe, right lower lobe as well as suspicious lower paratracheal lymphadenopathy and groundglass nodules in the left lung diagnosed in May 2022. The patient had molecular studies performed by [facility] and that showed no detectable mutation but this is likely secondary to low-level circulating free tumor DNA. If the patient has disease progression in the future, then we will likely retest her for molecular studies.   The patient is currently undergoing systemic chemotherapy with carboplatin for an AUC 5, Alimta 500 mg per metered square, and and Avastin 1500 mg/kg IV every 3 weeks.  She is status post 23 cycles.  Starting from cycle #7, the patient has been on maintenance treatment  with Alimta and Avastin.  Reviewed the patient's symptoms with Dr. Julien Nordmann.   Labs were reviewed.  Recommend that she proceed with cycle #24 today as scheduled.  Patient's oxygen is 100% on room air and her pulse is within normal limits.  However given that she is on Avastin and has known malignancy, she is at risk for thromboembolic events.  She also has some right ankle redness and mild swelling, though she has a history of knee fluid aspirated from this region in the past.  Patient states that her pain is frequent and interfering with her ability to sleep and has made her tearful on occasions.  She was feeling chest pressure with inspiration/pleuritic chest pain on exam today.  While her pain is improving, we will arrange for CT angio to restage her malignancy and to rule out blood clot.  Also arrange for Doppler ultrasound of the lower extremity  In the meantime, I refilled her oxycodone for pain. I have also refilled her compazine.   We will see her back for follow-up visit in 3 weeks for evaluation and repeat blood work before starting cycle #25.   We will recheck her thyroid function today due to the "puffiness of her thyroid.  If abnormal, I will refer her to an endocrinologist.  She will continue to use her lotion prescribed by her dermatologist.    The patient was advised to call immediately if she has any concerning symptoms in the interval. The patient voices understanding of current disease status and treatment options and is in agreement with the current care plan. All questions were answered. The patient knows to call the clinic with any problems, questions or concerns. We can certainly see the patient much sooner if necessary    No orders of the defined types were placed in this encounter.    The total time spent in the appointment was 30-39 minutes today  Drury Ardizzone L Donald Jacque, PA-C 10/20/22

## 2022-10-23 MED FILL — Dexamethasone Sodium Phosphate Inj 100 MG/10ML: INTRAMUSCULAR | Qty: 1 | Status: AC

## 2022-10-24 ENCOUNTER — Other Ambulatory Visit: Payer: Self-pay

## 2022-10-24 ENCOUNTER — Inpatient Hospital Stay: Payer: Commercial Managed Care - PPO | Attending: Hematology and Oncology

## 2022-10-24 ENCOUNTER — Inpatient Hospital Stay (HOSPITAL_BASED_OUTPATIENT_CLINIC_OR_DEPARTMENT_OTHER): Payer: Commercial Managed Care - PPO | Admitting: Physician Assistant

## 2022-10-24 ENCOUNTER — Inpatient Hospital Stay: Payer: Commercial Managed Care - PPO

## 2022-10-24 VITALS — BP 161/85 | HR 88 | Temp 98.0°F | Resp 16 | Ht 61.0 in | Wt 120.8 lb

## 2022-10-24 DIAGNOSIS — Z79899 Other long term (current) drug therapy: Secondary | ICD-10-CM | POA: Diagnosis not present

## 2022-10-24 DIAGNOSIS — C3411 Malignant neoplasm of upper lobe, right bronchus or lung: Secondary | ICD-10-CM | POA: Insufficient documentation

## 2022-10-24 DIAGNOSIS — Z5112 Encounter for antineoplastic immunotherapy: Secondary | ICD-10-CM | POA: Insufficient documentation

## 2022-10-24 DIAGNOSIS — C3491 Malignant neoplasm of unspecified part of right bronchus or lung: Secondary | ICD-10-CM

## 2022-10-24 DIAGNOSIS — R0789 Other chest pain: Secondary | ICD-10-CM | POA: Diagnosis not present

## 2022-10-24 DIAGNOSIS — R7989 Other specified abnormal findings of blood chemistry: Secondary | ICD-10-CM

## 2022-10-24 DIAGNOSIS — G893 Neoplasm related pain (acute) (chronic): Secondary | ICD-10-CM | POA: Diagnosis not present

## 2022-10-24 DIAGNOSIS — R0781 Pleurodynia: Secondary | ICD-10-CM | POA: Insufficient documentation

## 2022-10-24 DIAGNOSIS — M7989 Other specified soft tissue disorders: Secondary | ICD-10-CM

## 2022-10-24 DIAGNOSIS — Z5111 Encounter for antineoplastic chemotherapy: Secondary | ICD-10-CM

## 2022-10-24 HISTORY — DX: Pleurodynia: R07.81

## 2022-10-24 LAB — CBC WITH DIFFERENTIAL (CANCER CENTER ONLY)
Abs Immature Granulocytes: 0.03 10*3/uL (ref 0.00–0.07)
Basophils Absolute: 0 10*3/uL (ref 0.0–0.1)
Basophils Relative: 0 %
Eosinophils Absolute: 0 10*3/uL (ref 0.0–0.5)
Eosinophils Relative: 0 %
HCT: 44.1 % (ref 36.0–46.0)
Hemoglobin: 14.1 g/dL (ref 12.0–15.0)
Immature Granulocytes: 0 %
Lymphocytes Relative: 10 %
Lymphs Abs: 0.8 10*3/uL (ref 0.7–4.0)
MCH: 31.4 pg (ref 26.0–34.0)
MCHC: 32 g/dL (ref 30.0–36.0)
MCV: 98.2 fL (ref 80.0–100.0)
Monocytes Absolute: 0.3 10*3/uL (ref 0.1–1.0)
Monocytes Relative: 5 %
Neutro Abs: 6.1 10*3/uL (ref 1.7–7.7)
Neutrophils Relative %: 85 %
Platelet Count: 294 10*3/uL (ref 150–400)
RBC: 4.49 MIL/uL (ref 3.87–5.11)
RDW: 14.6 % (ref 11.5–15.5)
WBC Count: 7.3 10*3/uL (ref 4.0–10.5)
nRBC: 0 % (ref 0.0–0.2)

## 2022-10-24 LAB — TOTAL PROTEIN, URINE DIPSTICK: Protein, ur: 30 mg/dL — AB

## 2022-10-24 LAB — CMP (CANCER CENTER ONLY)
ALT: 24 U/L (ref 0–44)
AST: 29 U/L (ref 15–41)
Albumin: 3.8 g/dL (ref 3.5–5.0)
Alkaline Phosphatase: 118 U/L (ref 38–126)
Anion gap: 10 (ref 5–15)
BUN: 9 mg/dL (ref 6–20)
CO2: 24 mmol/L (ref 22–32)
Calcium: 9.7 mg/dL (ref 8.9–10.3)
Chloride: 103 mmol/L (ref 98–111)
Creatinine: 0.82 mg/dL (ref 0.44–1.00)
GFR, Estimated: >60 mL/min (ref >60)
Glucose, Bld: 175 mg/dL — ABNORMAL HIGH (ref 70–99)
Potassium: 4 mmol/L (ref 3.5–5.1)
Sodium: 137 mmol/L (ref 135–145)
Total Bilirubin: 0.5 mg/dL (ref 0.3–1.2)
Total Protein: 7.6 g/dL (ref 6.5–8.1)

## 2022-10-24 LAB — T4, FREE: Free T4: 0.79 ng/dL (ref 0.61–1.12)

## 2022-10-24 LAB — TSH: TSH: 0.53 u[IU]/mL (ref 0.350–4.500)

## 2022-10-24 MED ORDER — CYANOCOBALAMIN 1000 MCG/ML IJ SOLN
1000.0000 ug | Freq: Once | INTRAMUSCULAR | Status: AC
  Administered 2022-10-24: 1000 ug via INTRAMUSCULAR
  Filled 2022-10-24: qty 1

## 2022-10-24 MED ORDER — SODIUM CHLORIDE 0.9 % IV SOLN: Freq: Once | INTRAVENOUS | Status: AC

## 2022-10-24 MED ORDER — PROCHLORPERAZINE MALEATE 10 MG PO TABS
10.0000 mg | ORAL_TABLET | Freq: Four times a day (QID) | ORAL | Status: DC | PRN
  Administered 2022-10-24: 10 mg via ORAL
  Filled 2022-10-24: qty 1

## 2022-10-24 MED ORDER — SODIUM CHLORIDE 0.9 % IV SOLN
15.0000 mg/kg | Freq: Once | INTRAVENOUS | Status: AC
  Administered 2022-10-24: 800 mg via INTRAVENOUS
  Filled 2022-10-24: qty 32

## 2022-10-24 MED ORDER — SODIUM CHLORIDE 0.9 % IV SOLN
10.0000 mg | Freq: Once | INTRAVENOUS | Status: AC
  Administered 2022-10-24: 10 mg via INTRAVENOUS
  Filled 2022-10-24: qty 10

## 2022-10-24 MED ORDER — SODIUM CHLORIDE 0.9 % IV SOLN
500.0000 mg/m2 | Freq: Once | INTRAVENOUS | Status: AC
  Administered 2022-10-24: 800 mg via INTRAVENOUS
  Filled 2022-10-24: qty 20

## 2022-10-24 MED ORDER — PROCHLORPERAZINE MALEATE 10 MG PO TABS: 10.0000 mg | ORAL_TABLET | Freq: Four times a day (QID) | ORAL | 2 refills | Status: DC | PRN

## 2022-10-24 MED ORDER — OXYCODONE-ACETAMINOPHEN 5-325 MG PO TABS: 1.0000 | ORAL_TABLET | Freq: Three times a day (TID) | ORAL | 0 refills | Status: DC | PRN

## 2022-10-24 NOTE — Patient Instructions (Signed)
[facility]   Discharge Instructions: Thank you for choosing [facility] to provide your oncology and hematology care.   If you have a lab appointment with the [facility], please go directly to the [facility] and check in at the registration area.   Wear comfortable clothing and clothing appropriate for easy access to any Portacath or PICC line.   We strive to give you quality time with your provider. You may need to reschedule your appointment if you arrive late (15 or more minutes).  Arriving late affects you and other patients whose appointments are after yours.  Also, if you miss three or more appointments without notifying the office, you may be dismissed from the clinic at the provider's discretion.      For prescription refill requests, have your pharmacy contact our office and allow 72 hours for refills to be completed.    Today you received the following chemotherapy and/or immunotherapy agents: bevacizumab-awwb and pemetrexed      To help prevent nausea and vomiting after your treatment, we encourage you to take your nausea medication as directed.  BELOW ARE SYMPTOMS THAT SHOULD BE REPORTED IMMEDIATELY: *FEVER GREATER THAN 100.4 F (38 C) OR HIGHER *CHILLS OR SWEATING *NAUSEA AND VOMITING THAT IS NOT CONTROLLED WITH YOUR NAUSEA MEDICATION *UNUSUAL SHORTNESS OF BREATH *UNUSUAL BRUISING OR BLEEDING *URINARY PROBLEMS (pain or burning when urinating, or frequent urination) *BOWEL PROBLEMS (unusual diarrhea, constipation, pain near the anus) TENDERNESS IN MOUTH AND THROAT WITH OR WITHOUT PRESENCE OF ULCERS (sore throat, sores in mouth, or a toothache) UNUSUAL RASH, SWELLING OR PAIN  UNUSUAL VAGINAL DISCHARGE OR ITCHING   Items with * indicate a potential emergency and should be followed up as soon as possible or go to the Emergency Department if any problems should occur.  Please show the CHEMOTHERAPY ALERT CARD or IMMUNOTHERAPY  ALERT CARD at check-in to the Emergency Department and triage nurse.  Should you have questions after your visit or need to cancel or reschedule your appointment, please contact [facility]  Dept: [phone]  and follow the prompts.  Office hours are 8:00 a.m. to 4:30 p.m. Monday - Friday. Please note that voicemails left after 4:00 p.m. may not be returned until the following business day.  We are closed weekends and major holidays. You have access to a nurse at all times for urgent questions. Please call the main number to the clinic Dept: [phone] and follow the prompts.   For any non-urgent questions, you may also contact your provider using MyChart. We now offer e-Visits for anyone 47 and older to request care online for non-urgent symptoms. For details visit [email or web address].   Also download the MyChart app! Go to the app store, search "MyChart", open the app, select [facility], and log in with your MyChart username and password.  Masks are optional in the cancer centers. If you would like for your care team to wear a mask while they are taking care of you, please let them know. You may have one support person who is at least 52 years old accompany you for your appointments.

## 2022-10-25 ENCOUNTER — Telehealth: Payer: Self-pay

## 2022-10-25 ENCOUNTER — Ambulatory Visit (HOSPITAL_COMMUNITY)
Admission: RE | Admit: 2022-10-25 | Discharge: 2022-10-25 | Disposition: A | Discharge status: Home / Self Care | Payer: Commercial Managed Care - PPO | Source: Ambulatory Visit | Attending: Physician Assistant | Admitting: Physician Assistant

## 2022-10-25 ENCOUNTER — Telehealth: Payer: Self-pay | Admitting: Physician Assistant

## 2022-10-25 DIAGNOSIS — M7989 Other specified soft tissue disorders: Secondary | ICD-10-CM

## 2022-10-25 DIAGNOSIS — R0781 Pleurodynia: Secondary | ICD-10-CM

## 2022-10-25 DIAGNOSIS — C3491 Malignant neoplasm of unspecified part of right bronchus or lung: Secondary | ICD-10-CM | POA: Insufficient documentation

## 2022-10-25 DIAGNOSIS — G893 Neoplasm related pain (acute) (chronic): Secondary | ICD-10-CM

## 2022-10-25 MED ORDER — SODIUM CHLORIDE (PF) 0.9 % IJ SOLN
INTRAMUSCULAR | Status: AC
  Filled 2022-10-25: qty 50

## 2022-10-25 MED ORDER — IOHEXOL 350 MG/ML SOLN
100.0000 mL | Freq: Once | INTRAVENOUS | Status: AC | PRN
  Administered 2022-10-25: 100 mL via INTRAVENOUS

## 2022-10-25 NOTE — Telephone Encounter (Signed)
I called the patient and reviewed her scan results which are stable. She was appreciative of the call.

## 2022-10-25 NOTE — Progress Notes (Signed)
Right lower extremity venous duplex has been completed. Preliminary results can be found in CV Proc through chart review.  Results were given to Letona PA.  10/25/22 8:46 AM Carlos Levering RVT

## 2022-10-25 NOTE — Telephone Encounter (Signed)
This nurse reached out to patient per provider request and informed patient that her doppler was negative for blood clots. Advised to expect a call from central scheduling to set an appointment for her scan because insurance gave authorization.  Also made aware that her thyroid labs are on the lower end of normal.  Patient acknowledged understanding.  No further questions or concerns noted at this time.

## 2022-11-07 ENCOUNTER — Other Ambulatory Visit: Payer: Self-pay

## 2022-11-07 ENCOUNTER — Telehealth: Payer: Self-pay | Admitting: Pharmacy Technician

## 2022-11-07 NOTE — Telephone Encounter (Signed)
CHANGE IN SITE: [facility]  Auth Submission: APPROVED Payer: [employer] Medication & CPT/J Code(s) submitted: Prolia (Denosumab) [phone] Route of submission (phone, fax, portal): PHONE Phone [phone] Fax # Auth type: Buy/Bill Units/visits requested: X1 Reference number: [id number] Rep:  Max-C 11/07/22 - 2:11pm Approval from: 04/04/22 to 04/04/23

## 2022-11-09 ENCOUNTER — Other Ambulatory Visit: Payer: Self-pay

## 2022-11-12 ENCOUNTER — Other Ambulatory Visit: Payer: Self-pay | Admitting: Physician Assistant

## 2022-11-12 DIAGNOSIS — C3491 Malignant neoplasm of unspecified part of right bronchus or lung: Secondary | ICD-10-CM

## 2022-11-13 MED FILL — Dexamethasone Sodium Phosphate Inj 100 MG/10ML: INTRAMUSCULAR | Qty: 1 | Status: AC

## 2022-11-14 ENCOUNTER — Encounter: Payer: Self-pay | Admitting: Internal Medicine

## 2022-11-14 ENCOUNTER — Other Ambulatory Visit: Payer: Self-pay

## 2022-11-14 ENCOUNTER — Inpatient Hospital Stay (HOSPITAL_BASED_OUTPATIENT_CLINIC_OR_DEPARTMENT_OTHER): Payer: Commercial Managed Care - PPO | Admitting: Internal Medicine

## 2022-11-14 ENCOUNTER — Inpatient Hospital Stay: Payer: Commercial Managed Care - PPO

## 2022-11-14 ENCOUNTER — Inpatient Hospital Stay: Payer: Commercial Managed Care - PPO | Attending: Hematology and Oncology

## 2022-11-14 VITALS — BP 144/78 | HR 72 | Temp 98.2°F | Resp 15

## 2022-11-14 DIAGNOSIS — C3491 Malignant neoplasm of unspecified part of right bronchus or lung: Secondary | ICD-10-CM

## 2022-11-14 DIAGNOSIS — I10 Essential (primary) hypertension: Secondary | ICD-10-CM | POA: Diagnosis not present

## 2022-11-14 DIAGNOSIS — Z5112 Encounter for antineoplastic immunotherapy: Secondary | ICD-10-CM | POA: Diagnosis present

## 2022-11-14 DIAGNOSIS — M329 Systemic lupus erythematosus, unspecified: Secondary | ICD-10-CM | POA: Diagnosis not present

## 2022-11-14 DIAGNOSIS — C3411 Malignant neoplasm of upper lobe, right bronchus or lung: Secondary | ICD-10-CM | POA: Diagnosis present

## 2022-11-14 DIAGNOSIS — Z5111 Encounter for antineoplastic chemotherapy: Secondary | ICD-10-CM | POA: Diagnosis present

## 2022-11-14 LAB — CBC WITH DIFFERENTIAL (CANCER CENTER ONLY)
Abs Immature Granulocytes: 0.02 10*3/uL (ref 0.00–0.07)
Basophils Absolute: 0 10*3/uL (ref 0.0–0.1)
Basophils Relative: 0 %
Eosinophils Absolute: 0 10*3/uL (ref 0.0–0.5)
Eosinophils Relative: 0 %
HCT: 42.3 % (ref 36.0–46.0)
Hemoglobin: 14.2 g/dL (ref 12.0–15.0)
Immature Granulocytes: 0 %
Lymphocytes Relative: 9 %
Lymphs Abs: 0.7 10*3/uL (ref 0.7–4.0)
MCH: 32.8 pg (ref 26.0–34.0)
MCHC: 33.6 g/dL (ref 30.0–36.0)
MCV: 97.7 fL (ref 80.0–100.0)
Monocytes Absolute: 0.2 10*3/uL (ref 0.1–1.0)
Monocytes Relative: 2 %
Neutro Abs: 6.7 10*3/uL (ref 1.7–7.7)
Neutrophils Relative %: 89 %
Platelet Count: 224 10*3/uL (ref 150–400)
RBC: 4.33 MIL/uL (ref 3.87–5.11)
RDW: 15 % (ref 11.5–15.5)
WBC Count: 7.6 10*3/uL (ref 4.0–10.5)
nRBC: 0 % (ref 0.0–0.2)

## 2022-11-14 LAB — CMP (CANCER CENTER ONLY)
ALT: 24 U/L (ref 0–44)
AST: 24 U/L (ref 15–41)
Albumin: 4.2 g/dL (ref 3.5–5.0)
Alkaline Phosphatase: 127 U/L — ABNORMAL HIGH (ref 38–126)
Anion gap: 11 (ref 5–15)
BUN: 8 mg/dL (ref 6–20)
CO2: 23 mmol/L (ref 22–32)
Calcium: 10.5 mg/dL — ABNORMAL HIGH (ref 8.9–10.3)
Chloride: 103 mmol/L (ref 98–111)
Creatinine: 0.71 mg/dL (ref 0.44–1.00)
GFR, Estimated: >60 mL/min (ref >60)
Glucose, Bld: 190 mg/dL — ABNORMAL HIGH (ref 70–99)
Potassium: 4.1 mmol/L (ref 3.5–5.1)
Sodium: 137 mmol/L (ref 135–145)
Total Bilirubin: 0.4 mg/dL (ref 0.3–1.2)
Total Protein: 7.8 g/dL (ref 6.5–8.1)

## 2022-11-14 LAB — TOTAL PROTEIN, URINE DIPSTICK: Protein, ur: 100 mg/dL — AB

## 2022-11-14 MED ORDER — SODIUM CHLORIDE 0.9 % IV SOLN
10.0000 mg | Freq: Once | INTRAVENOUS | Status: AC
  Administered 2022-11-14: 10 mg via INTRAVENOUS
  Filled 2022-11-14: qty 10

## 2022-11-14 MED ORDER — SODIUM CHLORIDE 0.9 % IV SOLN
15.0000 mg/kg | Freq: Once | INTRAVENOUS | Status: AC
  Administered 2022-11-14: 800 mg via INTRAVENOUS
  Filled 2022-11-14: qty 32

## 2022-11-14 MED ORDER — SODIUM CHLORIDE 0.9 % IV SOLN: Freq: Once | INTRAVENOUS | Status: AC

## 2022-11-14 MED ORDER — SODIUM CHLORIDE 0.9% FLUSH: 10.0000 mL | INTRAVENOUS | Status: DC | PRN

## 2022-11-14 MED ORDER — SODIUM CHLORIDE 0.9 % IV SOLN
500.0000 mg/m2 | Freq: Once | INTRAVENOUS | Status: AC
  Administered 2022-11-14: 800 mg via INTRAVENOUS
  Filled 2022-11-14: qty 20

## 2022-11-14 MED ORDER — PROCHLORPERAZINE MALEATE 10 MG PO TABS
10.0000 mg | ORAL_TABLET | Freq: Once | ORAL | Status: AC
  Administered 2022-11-14: 10 mg via ORAL
  Filled 2022-11-14: qty 1

## 2022-11-14 NOTE — Progress Notes (Signed)
[facility] [phone]   [phone]  OFFICE PROGRESS NOTE  Cyndi Bender, PA-C [name]  DIAGNOSIS:  Stage IV (T4, N2, M1 a) non-small cell lung cancer, adenocarcinoma presented with multifocal disease involving the right upper lobe, right lower lobe as well as suspicious lower paratracheal lymphadenopathy and groundglass nodules in the left lung diagnosed in May 2022. The patient had molecular studies performed by [facility] and that showed no detectable mutation but this is likely secondary to low-level circulating free tumor DNA.  PRIOR THERAPY: None.  CURRENT THERAPY: palliative systemic chemotherapy with carboplatin for AUC of 5, Alimta 500 Mg/M2 and Avastin 15 Mg/KG every 3 weeks.  The patient is not a great candidate for immunotherapy because of her history of active systemic lupus erythematosus.  Starting from cycle #7 the patient is on maintenance treatment with Alimta and Avastin every 3 weeks.  She is status post 24 cycles.  INTERVAL HISTORY: Kirsten Williams 51 y.o. female returns to the clinic today for follow-up visit.  The patient is feeling fine today with no concerning complaints.  She had some mild jaw pain after her Prolia injection recently.  She did call her dentist and she will have an appointment for further evaluation.  She denied having any current chest pain, shortness of breath, cough or hemoptysis.  She has no nausea, vomiting, diarrhea or constipation.  She has no headache or visual changes.  She denied having any recent weight loss or night sweats.  She continues to tolerate her treatment with maintenance Alimta and Avastin fairly well.  The patient had repeat CT scan of the chest, abdomen and pelvis performed recently and she is here for evaluation and discussion of her scan results before starting cycle #25.  MEDICAL HISTORY: Past Medical History:  Diagnosis Date   Adenocarcinoma, lung,  right ([facility]) 05/03/2021   Kirsten Williams is a 51 y.o. female with a history of lung nodules dating back to 2019 who is referred in consultation with Cyndi Bender, PA-C for assessment and management. She is a former smoker having quit in 2016. 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 She missed her screening CT last year and imaging was ordered last month. CT chest from 02-16-2021 reveals pro   Anxiety    GERD (gastroesophageal reflux disease)    SLE (systemic lupus erythematosus) ([facility]) 05/03/2021   SLE (systemic lupus erythematosus) ([facility]) 05/03/2021    ALLERGIES:  is allergic to clindamycin/lincomycin and carboplatin.  MEDICATIONS:  Current Outpatient Medications  Medication Sig Dispense Refill   B Complex Vitamins (B COMPLEX 50 PO) Take by mouth.     Calcium Carbonate-Vit D-Min (CALCIUM 1200 PO) Take 1 capsule by mouth daily.     cholecalciferol (VITAMIN D3) 25 MCG (1000 UNIT) tablet Take 2,000 Units by mouth daily.     cyclobenzaprine (FLEXERIL) 10 MG tablet Take 10 mg by mouth at bedtime as needed for muscle spasms.     dexamethasone (DECADRON) 4 MG tablet 4 mg p.o. twice daily the day before, day of and day after the chemotherapy every 3 weeks. 40 tablet 2   dicyclomine (BENTYL) 10 MG capsule Take 10 mg by mouth every 6 (six) hours as needed. (Patient not taking: Reported on 02/28/2022)     diphenhydrAMINE (BENADRYL) 25 MG tablet Take 25 mg by mouth daily as needed for allergies.     famotidine (PEPCID) 20 MG tablet Take  20 mg by mouth at bedtime.     folic acid (FOLVITE) 1 MG tablet TAKE 1 TABLET(1 MG) BY MOUTH DAILY 30 tablet 4   gabapentin (NEURONTIN) 100 MG capsule Take 100 mg by mouth 3 (three) times daily. (Patient not taking: Reported on 09/12/2022)     hydrocortisone 1 % lotion Apply 1 application topically 2 (two) times daily. 118 mL 0   hydroxychloroquine (PLAQUENIL) 200 MG tablet Take 200 mg by mouth 2 (two) times daily.     Multiple Vitamins-Minerals  (MULTI ADULT GUMMIES) CHEW Chew 2 tablets by mouth daily.     omeprazole (PRILOSEC) 40 MG capsule Take 40 mg by mouth daily as needed (acid reflux).     oxyCODONE-acetaminophen (PERCOCET/ROXICET) 5-325 MG tablet Take 1 tablet by mouth every 8 (eight) hours as needed for severe pain. 30 tablet 0   Probiotic Product (PROBIOTIC-10 PO) Take by mouth.     prochlorperazine (COMPAZINE) 10 MG tablet TAKE 1 TABLET(10 MG) BY MOUTH EVERY 6 HOURS AS NEEDED 30 tablet 2   rizatriptan (MAXALT-MLT) 10 MG disintegrating tablet Take 10 mg by mouth 2 (two) times daily as needed.     tetrahydrozoline 0.05 % ophthalmic solution Place 1 drop into both eyes once. Systane Eye Drops once daily both eyes     triamcinolone ointment (KENALOG) 0.5 % Apply topically 3 (three) times daily.     No current facility-administered medications for this visit.    SURGICAL HISTORY:  Past Surgical History:  Procedure Laterality Date   ABDOMINAL HYSTERECTOMY     BRONCHIAL BIOPSY  04/24/2021   Procedure: BRONCHIAL BIOPSIES;  Surgeon: Collene Gobble, MD;  Location: [facility] ENDOSCOPY;  Service: Pulmonary;;   BRONCHIAL BRUSHINGS  04/24/2021   Procedure: BRONCHIAL BRUSHINGS;  Surgeon: Collene Gobble, MD;  Location: [facility] ENDOSCOPY;  Service: Pulmonary;;   BRONCHIAL NEEDLE ASPIRATION BIOPSY  04/24/2021   Procedure: BRONCHIAL NEEDLE ASPIRATION BIOPSIES;  Surgeon: Collene Gobble, MD;  Location: [facility];  Service: Pulmonary;;   BRONCHIAL WASHINGS  04/24/2021   Procedure: BRONCHIAL WASHINGS;  Surgeon: Collene Gobble, MD;  Location: [facility];  Service: Pulmonary;;   CESAREAN SECTION  11/09/1990   DILATION AND CURETTAGE OF UTERUS Bilateral 09/09/1996   FIDUCIAL MARKER PLACEMENT  04/24/2021   Procedure: FIDUCIAL MARKER PLACEMENT;  Surgeon: Collene Gobble, MD;  Location: [facility] ENDOSCOPY;  Service: Pulmonary;;   TONSILLECTOMY  12/10/1977   VIDEO BRONCHOSCOPY WITH ENDOBRONCHIAL NAVIGATION Bilateral 04/24/2021   Procedure: VIDEO BRONCHOSCOPY WITH  ENDOBRONCHIAL NAVIGATION;  Surgeon: Collene Gobble, MD;  Location: [facility] ENDOSCOPY;  Service: Pulmonary;  Laterality: Bilateral;    REVIEW OF SYSTEMS:  Constitutional: negative Eyes: negative Ears, nose, mouth, throat, and face: positive for jaw pain Respiratory: negative Cardiovascular: negative Gastrointestinal: negative Genitourinary:negative Integument/breast: negative Hematologic/lymphatic: negative Musculoskeletal:negative Neurological: negative Behavioral/Psych: negative Endocrine: negative Allergic/Immunologic: negative   PHYSICAL EXAMINATION: General appearance: alert, cooperative, and no distress Head: Normocephalic, without obvious abnormality, atraumatic Neck: no adenopathy, no JVD, supple, symmetrical, trachea midline, and thyroid not enlarged, symmetric, no tenderness/mass/nodules Lymph nodes: Cervical, supraclavicular, and axillary nodes normal. Resp: clear to auscultation bilaterally Back: symmetric, no curvature. ROM normal. No CVA tenderness. Cardio: regular rate and rhythm, S1, S2 normal, no murmur, click, rub or gallop GI: soft, non-tender; bowel sounds normal; no masses,  no organomegaly Extremities: extremities normal, atraumatic, no cyanosis or edema Neurologic: Alert and oriented X 3, normal strength and tone. Normal symmetric reflexes. Normal coordination and gait  ECOG PERFORMANCE STATUS: 1 - Symptomatic but completely ambulatory  Blood pressure (!) 155/92, pulse 77, temperature 98.7 F (37.1 C), temperature source Oral, resp. rate 16, weight 120 lb 3.2 oz (54.5 kg), SpO2 100 %.  LABORATORY DATA: Lab Results  Component Value Date   WBC 7.6 11/14/2022   HGB 14.2 11/14/2022   HCT 42.3 11/14/2022   MCV 97.7 11/14/2022   PLT 224 11/14/2022      Chemistry      Component Value Date/Time   NA 137 11/14/2022 0913   NA 140 03/13/2021 0000   K 4.1 11/14/2022 0913   CL 103 11/14/2022 0913   CO2 23 11/14/2022 0913   BUN 8 11/14/2022 0913   BUN 10  03/13/2021 0000   CREATININE 0.71 11/14/2022 0913   GLU 107 03/13/2021 0000      Component Value Date/Time   CALCIUM 10.5 (H) 11/14/2022 0913   ALKPHOS 127 (H) 11/14/2022 0913   AST 24 11/14/2022 0913   ALT 24 11/14/2022 0913   BILITOT 0.4 11/14/2022 0913       RADIOGRAPHIC STUDIES: VAS [place] LOWER EXTREMITY VENOUS (DVT)  Result Date: 10/26/2022  Lower Venous DVT Study Patient Name:  Kirsten Williams  Date of Exam:   10/25/2022 Medical Rec #: [id number]         Accession #:    [id number] Date of Birth: [birth year]         Patient Gender: F Patient Age:   13 years Exam Location:  [facility] Procedure:      VAS [place] LOWER EXTREMITY VENOUS (DVT) Referring Phys: Roosevelt Locks --------------------------------------------------------------------------------  Indications: Swelling.  Risk Factors: Cancer. Comparison Study: No prior studies. Performing Technologist: Oliver Hum RVT  Examination Guidelines: A complete evaluation includes B-mode imaging, spectral Doppler, color Doppler, and power Doppler as needed of all accessible portions of each vessel. Bilateral testing is considered an integral part of a complete examination. Limited examinations for reoccurring indications may be performed as noted. The reflux portion of the exam is performed with the patient in reverse Trendelenburg.  +---------+---------------+---------+-----------+----------+--------------+ RIGHT    CompressibilityPhasicitySpontaneityPropertiesThrombus Aging +---------+---------------+---------+-----------+----------+--------------+ CFV      Full           Yes      Yes                                 +---------+---------------+---------+-----------+----------+--------------+ SFJ      Full                                                        +---------+---------------+---------+-----------+----------+--------------+ FV Prox  Full                                                         +---------+---------------+---------+-----------+----------+--------------+ FV Mid   Full                                                        +---------+---------------+---------+-----------+----------+--------------+ FV DistalFull                                                        +---------+---------------+---------+-----------+----------+--------------+  PFV      Full                                                        +---------+---------------+---------+-----------+----------+--------------+ POP      Full           Yes      Yes                                 +---------+---------------+---------+-----------+----------+--------------+ PTV      Full                                                        +---------+---------------+---------+-----------+----------+--------------+ PERO     Full                                                        +---------+---------------+---------+-----------+----------+--------------+   +----+---------------+---------+-----------+----------+--------------+ LEFTCompressibilityPhasicitySpontaneityPropertiesThrombus Aging +----+---------------+---------+-----------+----------+--------------+ CFV Full           Yes      Yes                                 +----+---------------+---------+-----------+----------+--------------+     Summary: RIGHT: - There is no evidence of deep vein thrombosis in the lower extremity.  - No cystic structure found in the popliteal fossa.  LEFT: - No evidence of common femoral vein obstruction.  *See table(s) above for measurements and observations. Electronically signed by Deitra Mayo MD on 10/26/2022 at 3:01:13 PM.    Final    CT Angio Chest Pulmonary Embolism (PE) W or WO Contrast  Result Date: 10/25/2022 CLINICAL DATA:  History of lung cancer.  Pleuritic chest pain. * Tracking Code: BO * EXAM: CT ANGIOGRAPHY CHEST WITH CONTRAST TECHNIQUE: Multidetector CT imaging of the  chest was performed using the standard protocol during bolus administration of intravenous contrast. Multiplanar CT image reconstructions and MIPs were obtained to evaluate the vascular anatomy. RADIATION DOSE REDUCTION: This exam was performed according to the departmental dose-optimization program which includes automated exposure control, adjustment of the mA and/or kV according to patient size and/or use of iterative reconstruction technique. CONTRAST:  138mL OMNIPAQUE IOHEXOL 350 MG/ML SOLN COMPARISON:  CT chest 08/20/2022 FINDINGS: Cardiovascular: There is adequate opacification of the pulmonary arteries to the segmental level. There is no evidence of pulmonary embolism. The heart size is stable. There is no pericardial effusion. The thoracic aorta is unremarkable. Mediastinum/Nodes: The thyroid is unremarkable. The esophagus is grossly unremarkable. The 8-9 mm pretracheal lymph node is unchanged (5-104). There is no new or progressive mediastinal, hilar, or axillary lymphadenopathy. Lungs/Pleura: The trachea and central airways are patent. There is no focal consolidation or pulmonary edema. There is no pleural effusion or pneumothorax. Scattered calcified granulomas are stable. *The approximately 1.8 cm by 1.1 cm part solid nodule in the superior segment of the right lower lobe is not significantly  changed allowing for slight differences in measurement technique and slice selection (56-81). *The 1.3 cm x 0.9 cm part solid nodule in the right apex is unchanged (10-32). *The 2.0 cm by 1.4 cm ground-glass nodule in the left apex is unchanged (10-38). *The 3-4 mm subpleural nodule in the anterolateral left base is unchanged (10-108). *There are no new or enlarging nodules. Upper Abdomen: Assessed on the separately dictated CT abdomen/pelvis. Musculoskeletal: There is no acute osseous abnormality or suspicious osseous lesion. Review of the MIP images confirms the above findings. IMPRESSION: 1. No acute pulmonary  embolism or other acute cardiopulmonary pathology. 2. Scattered pulmonary nodules described above are stable since 08/20/2022. No new or enlarging nodules. 3. Stable 8-9 mm pretracheal lymph node. No new or enlarging lymphadenopathy in the chest. Electronically Signed   By: Valetta Mole M.D.   On: 10/25/2022 12:20   CT Abdomen Pelvis W Contrast  Result Date: 10/25/2022 CLINICAL DATA:  History of stage IV adenocarcinoma of the lung. EXAM: CT ABDOMEN AND PELVIS WITH CONTRAST TECHNIQUE: Multidetector CT imaging of the abdomen and pelvis was performed using the standard protocol following bolus administration of intravenous contrast. RADIATION DOSE REDUCTION: This exam was performed according to the departmental dose-optimization program which includes automated exposure control, adjustment of the mA and/or kV according to patient size and/or use of iterative reconstruction technique. CONTRAST:  147mL OMNIPAQUE IOHEXOL 350 MG/ML SOLN COMPARISON:  08/20/2022 FINDINGS: Lower chest: Insert lung bases Hepatobiliary: No hepatic lesions or intrahepatic biliary dilatation. The gallbladder is unremarkable. No common bile duct dilatation. Pancreas: No mass, inflammation or ductal dilatation. Spleen: Normal size.  No focal lesions. Adrenals/Urinary Tract: Stable bilateral benign adrenal gland adenomas. No further imaging evaluation or follow-up is necessary. The kidneys are unremarkable. The bladder is unremarkable. Stomach/Bowel: The stomach, duodenum, small bowel and colon are unremarkable. Low lying cecum deep in the pelvis. The appendix is normal. Vascular/Lymphatic: The aorta is normal in caliber. No dissection. The branch vessels are patent. The major venous structures are patent. No mesenteric or retroperitoneal mass or adenopathy. Small scattered lymph nodes are noted. Reproductive: Surgically absent. Other: No pelvic mass or adenopathy. No free pelvic fluid collections. No inguinal mass or adenopathy. No abdominal  wall hernia or subcutaneous lesions. Musculoskeletal: No significant bony findings. IMPRESSION: 1. No acute abdominal/pelvic findings, mass lesions or adenopathy. No findings to suggest metastatic disease. 2. Stable bilateral benign adrenal gland adenomas. Electronically Signed   By: Marijo Sanes M.D.   On: 10/25/2022 12:14    ASSESSMENT AND PLAN:  This is a very pleasant 51 years old white female recently diagnosed with a stage IV non-small cell lung cancer, adenocarcinoma with no actionable mutation diagnosed in May 2022.  The patient has also history of systemic lupus erythematosus and she is not a candidate for immunotherapy. She is currently undergoing systemic chemotherapy with carboplatin for AUC of 5, Alimta 500 Mg/M2 and Avastin 15 Mg/KG every 3 weeks status post 24 cycles.  Starting from cycle #7 the patient is on maintenance treatment with Alimta and Avastin every 3 weeks. The patient has been tolerating her treatment well with no concerning adverse effects. She had repeat CT scan of the chest, abdomen and pelvis performed recently.  I personally and independently reviewed the scan and discussed the result with the patient today. Her scan showed no concerning finding for disease progression. I recommended for her to continue her current treatment with maintenance Alimta and Avastin as planned. I will see her back for follow-up visit  in 3 weeks for evaluation before the next cycle of her treatment. The patient was advised to call immediately if she has any other concerning symptoms in the interval. The patient voices understanding of current disease status and treatment options and is in agreement with the current care plan.  All questions were answered. The patient knows to call the clinic with any problems, questions or concerns. We can certainly see the patient much sooner if necessary.  Disclaimer: This note was dictated with voice recognition software. Similar sounding words can  inadvertently be transcribed and may not be corrected upon review.

## 2022-11-14 NOTE — Progress Notes (Signed)
Per Dr. Leander Rams is ok to treat pt today with avastin and urine protein fo 100.

## 2022-11-14 NOTE — Patient Instructions (Signed)
[facility]   Discharge Instructions: Thank you for choosing [facility] to provide your oncology and hematology care.   If you have a lab appointment with the [facility], please go directly to the [facility] and check in at the registration area.   Wear comfortable clothing and clothing appropriate for easy access to any Portacath or PICC line.   We strive to give you quality time with your provider. You may need to reschedule your appointment if you arrive late (15 or more minutes).  Arriving late affects you and other patients whose appointments are after yours.  Also, if you miss three or more appointments without notifying the office, you may be dismissed from the clinic at the provider's discretion.      For prescription refill requests, have your pharmacy contact our office and allow 72 hours for refills to be completed.    Today you received the following chemotherapy and/or immunotherapy agents: bevacizumab-awwb and pemetrexed      To help prevent nausea and vomiting after your treatment, we encourage you to take your nausea medication as directed.  BELOW ARE SYMPTOMS THAT SHOULD BE REPORTED IMMEDIATELY: *FEVER GREATER THAN 100.4 F (38 C) OR HIGHER *CHILLS OR SWEATING *NAUSEA AND VOMITING THAT IS NOT CONTROLLED WITH YOUR NAUSEA MEDICATION *UNUSUAL SHORTNESS OF BREATH *UNUSUAL BRUISING OR BLEEDING *URINARY PROBLEMS (pain or burning when urinating, or frequent urination) *BOWEL PROBLEMS (unusual diarrhea, constipation, pain near the anus) TENDERNESS IN MOUTH AND THROAT WITH OR WITHOUT PRESENCE OF ULCERS (sore throat, sores in mouth, or a toothache) UNUSUAL RASH, SWELLING OR PAIN  UNUSUAL VAGINAL DISCHARGE OR ITCHING   Items with * indicate a potential emergency and should be followed up as soon as possible or go to the Emergency Department if any problems should occur.  Please show the CHEMOTHERAPY ALERT CARD or IMMUNOTHERAPY  ALERT CARD at check-in to the Emergency Department and triage nurse.  Should you have questions after your visit or need to cancel or reschedule your appointment, please contact [facility]  Dept: [phone]  and follow the prompts.  Office hours are 8:00 a.m. to 4:30 p.m. Monday - Friday. Please note that voicemails left after 4:00 p.m. may not be returned until the following business day.  We are closed weekends and major holidays. You have access to a nurse at all times for urgent questions. Please call the main number to the clinic Dept: [phone] and follow the prompts.   For any non-urgent questions, you may also contact your provider using MyChart. We now offer e-Visits for anyone 48 and older to request care online for non-urgent symptoms. For details visit [email or web address].   Also download the MyChart app! Go to the app store, search "MyChart", open the app, select , and log in with your MyChart username and password.  Masks are optional in the cancer centers. If you would like for your care team to wear a mask while they are taking care of you, please let them know. You may have one support person who is at least 52 years old accompany you for your appointments.

## 2022-11-23 ENCOUNTER — Telehealth: Payer: Self-pay

## 2022-11-23 ENCOUNTER — Other Ambulatory Visit: Payer: Self-pay | Admitting: Physician Assistant

## 2022-11-23 DIAGNOSIS — C3491 Malignant neoplasm of unspecified part of right bronchus or lung: Secondary | ICD-10-CM

## 2022-11-23 NOTE — Telephone Encounter (Signed)
This nurse reached out to patient related to message received about not having an appointment for her next infusion. This nurse left a message and assured the patient that this nurse is currently working on getting her scheduled for her next infusion, office visit and labs.  Advised that this nurse will give a call once her schedule has been updated.  No further questions or concerns at this time.

## 2022-11-30 ENCOUNTER — Telehealth: Payer: Self-pay | Admitting: Internal Medicine

## 2022-11-30 NOTE — Telephone Encounter (Signed)
Patient called to change appointments to earlier times for 12/27. After speaking with charge moved patient appointments and called patient. Patient notified.

## 2022-12-03 ENCOUNTER — Other Ambulatory Visit: Payer: Self-pay | Admitting: Physician Assistant

## 2022-12-03 DIAGNOSIS — C3491 Malignant neoplasm of unspecified part of right bronchus or lung: Secondary | ICD-10-CM

## 2022-12-04 ENCOUNTER — Other Ambulatory Visit: Payer: Self-pay

## 2022-12-04 DIAGNOSIS — C3491 Malignant neoplasm of unspecified part of right bronchus or lung: Secondary | ICD-10-CM

## 2022-12-05 ENCOUNTER — Other Ambulatory Visit: Payer: Commercial Managed Care - PPO

## 2022-12-05 ENCOUNTER — Ambulatory Visit: Payer: Commercial Managed Care - PPO | Admitting: Internal Medicine

## 2022-12-05 ENCOUNTER — Inpatient Hospital Stay: Payer: Commercial Managed Care - PPO

## 2022-12-05 ENCOUNTER — Inpatient Hospital Stay: Payer: Commercial Managed Care - PPO | Admitting: Internal Medicine

## 2022-12-05 ENCOUNTER — Ambulatory Visit: Payer: Commercial Managed Care - PPO

## 2022-12-05 ENCOUNTER — Other Ambulatory Visit: Payer: Self-pay

## 2022-12-05 VITALS — BP 166/90 | HR 90 | Resp 18

## 2022-12-05 VITALS — BP 169/97 | HR 80 | Temp 97.6°F | Resp 15 | Wt 120.2 lb

## 2022-12-05 DIAGNOSIS — C3491 Malignant neoplasm of unspecified part of right bronchus or lung: Secondary | ICD-10-CM | POA: Diagnosis not present

## 2022-12-05 DIAGNOSIS — Z5112 Encounter for antineoplastic immunotherapy: Secondary | ICD-10-CM | POA: Diagnosis not present

## 2022-12-05 LAB — CBC WITH DIFFERENTIAL/PLATELET
Abs Immature Granulocytes: 0.03 10*3/uL (ref 0.00–0.07)
Basophils Absolute: 0 10*3/uL (ref 0.0–0.1)
Basophils Relative: 0 %
Eosinophils Absolute: 0 10*3/uL (ref 0.0–0.5)
Eosinophils Relative: 0 %
HCT: 40.8 % (ref 36.0–46.0)
Hemoglobin: 13.1 g/dL (ref 12.0–15.0)
Immature Granulocytes: 0 %
Lymphocytes Relative: 11 %
Lymphs Abs: 0.8 10*3/uL (ref 0.7–4.0)
MCH: 31.4 pg (ref 26.0–34.0)
MCHC: 32.1 g/dL (ref 30.0–36.0)
MCV: 97.8 fL (ref 80.0–100.0)
Monocytes Absolute: 0.3 10*3/uL (ref 0.1–1.0)
Monocytes Relative: 3 %
Neutro Abs: 6.7 10*3/uL (ref 1.7–7.7)
Neutrophils Relative %: 86 %
Platelets: 323 10*3/uL (ref 150–400)
RBC: 4.17 MIL/uL (ref 3.87–5.11)
RDW: 15.4 % (ref 11.5–15.5)
WBC: 7.8 10*3/uL (ref 4.0–10.5)
nRBC: 0 % (ref 0.0–0.2)

## 2022-12-05 LAB — COMPREHENSIVE METABOLIC PANEL
ALT: 22 U/L (ref 0–44)
AST: 22 U/L (ref 15–41)
Albumin: 3.9 g/dL (ref 3.5–5.0)
Alkaline Phosphatase: 128 U/L — ABNORMAL HIGH (ref 38–126)
Anion gap: 8 (ref 5–15)
BUN: 9 mg/dL (ref 6–20)
CO2: 25 mmol/L (ref 22–32)
Calcium: 9.5 mg/dL (ref 8.9–10.3)
Chloride: 104 mmol/L (ref 98–111)
Creatinine, Ser: 0.71 mg/dL (ref 0.44–1.00)
GFR, Estimated: >60 mL/min (ref >60)
Glucose, Bld: 206 mg/dL — ABNORMAL HIGH (ref 70–99)
Potassium: 3.7 mmol/L (ref 3.5–5.1)
Sodium: 137 mmol/L (ref 135–145)
Total Bilirubin: 0.3 mg/dL (ref 0.3–1.2)
Total Protein: 7.9 g/dL (ref 6.5–8.1)

## 2022-12-05 LAB — TOTAL PROTEIN, URINE DIPSTICK: Protein, ur: 30 mg/dL — AB

## 2022-12-05 MED ORDER — SODIUM CHLORIDE 0.9 % IV SOLN
10.0000 mg | Freq: Once | INTRAVENOUS | Status: AC
  Administered 2022-12-05: 10 mg via INTRAVENOUS
  Filled 2022-12-05: qty 10

## 2022-12-05 MED ORDER — SODIUM CHLORIDE 0.9 % IV SOLN
15.0000 mg/kg | Freq: Once | INTRAVENOUS | Status: AC
  Administered 2022-12-05: 800 mg via INTRAVENOUS
  Filled 2022-12-05: qty 32

## 2022-12-05 MED ORDER — PROCHLORPERAZINE MALEATE 10 MG PO TABS
10.0000 mg | ORAL_TABLET | Freq: Four times a day (QID) | ORAL | Status: DC | PRN
  Administered 2022-12-05: 10 mg via ORAL
  Filled 2022-12-05: qty 1

## 2022-12-05 MED ORDER — SODIUM CHLORIDE 0.9 % IV SOLN: Freq: Once | INTRAVENOUS | Status: AC

## 2022-12-05 MED ORDER — SODIUM CHLORIDE 0.9 % IV SOLN
500.0000 mg/m2 | Freq: Once | INTRAVENOUS | Status: AC
  Administered 2022-12-05: 800 mg via INTRAVENOUS
  Filled 2022-12-05: qty 20

## 2022-12-05 MED ORDER — CYANOCOBALAMIN 1000 MCG/ML IJ SOLN
1000.0000 ug | Freq: Once | INTRAMUSCULAR | Status: AC
  Administered 2022-12-05: 1000 ug via INTRAMUSCULAR
  Filled 2022-12-05: qty 1

## 2022-12-05 NOTE — Progress Notes (Signed)
[facility] [phone]   [phone]  OFFICE PROGRESS NOTE  Cyndi Bender, PA-C [place] [address]  DIAGNOSIS:  Stage IV (T4, N2, M1 a) non-small cell lung cancer, adenocarcinoma presented with multifocal disease involving the right upper lobe, right lower lobe as well as suspicious lower paratracheal lymphadenopathy and groundglass nodules in the left lung diagnosed in May 2022. The patient had molecular studies performed by [facility] and that showed no detectable mutation but this is likely secondary to low-level circulating free tumor DNA.  PRIOR THERAPY: None.  CURRENT THERAPY: palliative systemic chemotherapy with carboplatin for AUC of 5, Alimta 500 Mg/M2 and Avastin 15 Mg/KG every 3 weeks.  The patient is not a great candidate for immunotherapy because of her history of active systemic lupus erythematosus.  Starting from cycle #7 the patient is on maintenance treatment with Alimta and Avastin every 3 weeks.  She is status post 25 cycles.  INTERVAL HISTORY: JORDAN CARAVEO 51 y.o. female returns to the clinic today for follow-up visit.  The patient is feeling much better today with no concerning complaints.  2 weeks ago she had flulike symptoms with a lot of cough and chest congestion but she recovered last week.  She denied having any chest pain except for some soreness on the right side from the previous cough.  She denied having any shortness of breath or hemoptysis.  She has no nausea, vomiting, diarrhea or constipation.  She has no headache or visual changes.  She is here today for evaluation before starting cycle #26 of her treatment.   MEDICAL HISTORY: Past Medical History:  Diagnosis Date   Adenocarcinoma, lung, right ([facility]) 05/03/2021   KATHLYN LEACHMAN is a 51 y.o. female with a history of lung nodules dating back to 2019 who is referred in consultation with Cyndi Bender, PA-C for assessment and management. She is  a former smoker having quit in 2016. 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 She missed her screening CT last year and imaging was ordered last month. CT chest from 02-16-2021 reveals pro   Anxiety    GERD (gastroesophageal reflux disease)    SLE (systemic lupus erythematosus) ([facility]) 05/03/2021   SLE (systemic lupus erythematosus) ([facility]) 05/03/2021    ALLERGIES:  is allergic to clindamycin/lincomycin and carboplatin.  MEDICATIONS:  Current Outpatient Medications  Medication Sig Dispense Refill   B Complex Vitamins (B COMPLEX 50 PO) Take by mouth.     Calcium Carbonate-Vit D-Min (CALCIUM 1200 PO) Take 1 capsule by mouth daily.     cholecalciferol (VITAMIN D3) 25 MCG (1000 UNIT) tablet Take 2,000 Units by mouth daily.     cyclobenzaprine (FLEXERIL) 10 MG tablet Take 10 mg by mouth at bedtime as needed for muscle spasms.     denosumab (PROLIA) 60 MG/ML SOSY injection Inject 60 mg into the skin every 6 (six) months.     dexamethasone (DECADRON) 4 MG tablet 4 mg p.o. twice daily the day before, day of and day after the chemotherapy every 3 weeks. 40 tablet 2   dicyclomine (BENTYL) 10 MG capsule Take 10 mg by mouth every 6 (six) hours as needed.     diphenhydrAMINE (BENADRYL) 25 MG tablet Take 25 mg by mouth daily as needed for allergies.     famotidine (PEPCID) 20 MG tablet Take 20 mg by mouth at bedtime.     folic acid (FOLVITE) 1 MG tablet TAKE  1 TABLET(1 MG) BY MOUTH DAILY 30 tablet 4   gabapentin (NEURONTIN) 100 MG capsule Take 100 mg by mouth 3 (three) times daily.     hydrocortisone 1 % lotion Apply 1 application topically 2 (two) times daily. 118 mL 0   hydroxychloroquine (PLAQUENIL) 200 MG tablet Take 200 mg by mouth 2 (two) times daily.     Multiple Vitamins-Minerals (MULTI ADULT GUMMIES) CHEW Chew 2 tablets by mouth daily.     omeprazole (PRILOSEC) 40 MG capsule Take 40 mg by mouth daily as needed (acid reflux).     oxyCODONE-acetaminophen (PERCOCET/ROXICET)  5-325 MG tablet Take 1 tablet by mouth every 8 (eight) hours as needed for severe pain. 30 tablet 0   Probiotic Product (PROBIOTIC-10 PO) Take by mouth.     prochlorperazine (COMPAZINE) 10 MG tablet TAKE 1 TABLET(10 MG) BY MOUTH EVERY 6 HOURS AS NEEDED 30 tablet 2   rizatriptan (MAXALT-MLT) 10 MG disintegrating tablet Take 10 mg by mouth 2 (two) times daily as needed.     tetrahydrozoline 0.05 % ophthalmic solution Place 1 drop into both eyes once. Systane Eye Drops once daily both eyes     triamcinolone ointment (KENALOG) 0.5 % Apply topically 3 (three) times daily.     No current facility-administered medications for this visit.    SURGICAL HISTORY:  Past Surgical History:  Procedure Laterality Date   ABDOMINAL HYSTERECTOMY     BRONCHIAL BIOPSY  04/24/2021   Procedure: BRONCHIAL BIOPSIES;  Surgeon: Collene Gobble, MD;  Location: [facility] ENDOSCOPY;  Service: Pulmonary;;   BRONCHIAL BRUSHINGS  04/24/2021   Procedure: BRONCHIAL BRUSHINGS;  Surgeon: Collene Gobble, MD;  Location: [facility] ENDOSCOPY;  Service: Pulmonary;;   BRONCHIAL NEEDLE ASPIRATION BIOPSY  04/24/2021   Procedure: BRONCHIAL NEEDLE ASPIRATION BIOPSIES;  Surgeon: Collene Gobble, MD;  Location: [facility];  Service: Pulmonary;;   BRONCHIAL WASHINGS  04/24/2021   Procedure: BRONCHIAL WASHINGS;  Surgeon: Collene Gobble, MD;  Location: [facility];  Service: Pulmonary;;   CESAREAN SECTION  11/09/1990   DILATION AND CURETTAGE OF UTERUS Bilateral 09/09/1996   FIDUCIAL MARKER PLACEMENT  04/24/2021   Procedure: FIDUCIAL MARKER PLACEMENT;  Surgeon: Collene Gobble, MD;  Location: [facility] ENDOSCOPY;  Service: Pulmonary;;   TONSILLECTOMY  12/10/1977   VIDEO BRONCHOSCOPY WITH ENDOBRONCHIAL NAVIGATION Bilateral 04/24/2021   Procedure: VIDEO BRONCHOSCOPY WITH ENDOBRONCHIAL NAVIGATION;  Surgeon: Collene Gobble, MD;  Location: [facility] ENDOSCOPY;  Service: Pulmonary;  Laterality: Bilateral;    REVIEW OF SYSTEMS:  A comprehensive review of systems was  negative.   PHYSICAL EXAMINATION: General appearance: alert, cooperative, and no distress Head: Normocephalic, without obvious abnormality, atraumatic Neck: no adenopathy, no JVD, supple, symmetrical, trachea midline, and thyroid not enlarged, symmetric, no tenderness/mass/nodules Lymph nodes: Cervical, supraclavicular, and axillary nodes normal. Resp: clear to auscultation bilaterally Back: symmetric, no curvature. ROM normal. No CVA tenderness. Cardio: regular rate and rhythm, S1, S2 normal, no murmur, click, rub or gallop GI: soft, non-tender; bowel sounds normal; no masses,  no organomegaly Extremities: extremities normal, atraumatic, no cyanosis or edema  ECOG PERFORMANCE STATUS: 1 - Symptomatic but completely ambulatory  Blood pressure (!) 169/97, pulse 80, temperature 97.6 F (36.4 C), temperature source Oral, resp. rate 15, weight 120 lb 3.2 oz (54.5 kg), SpO2 100 %.  LABORATORY DATA: Lab Results  Component Value Date   WBC 7.8 12/05/2022   HGB 13.1 12/05/2022   HCT 40.8 12/05/2022   MCV 97.8 12/05/2022   PLT 323 12/05/2022  Chemistry      Component Value Date/Time   NA 137 11/14/2022 0913   NA 140 03/13/2021 0000   K 4.1 11/14/2022 0913   CL 103 11/14/2022 0913   CO2 23 11/14/2022 0913   BUN 8 11/14/2022 0913   BUN 10 03/13/2021 0000   CREATININE 0.71 11/14/2022 0913   GLU 107 03/13/2021 0000      Component Value Date/Time   CALCIUM 10.5 (H) 11/14/2022 0913   ALKPHOS 127 (H) 11/14/2022 0913   AST 24 11/14/2022 0913   ALT 24 11/14/2022 0913   BILITOT 0.4 11/14/2022 0913       RADIOGRAPHIC STUDIES: No results found.  ASSESSMENT AND PLAN:  This is a very pleasant 51 years old white female recently diagnosed with a stage IV non-small cell lung cancer, adenocarcinoma with no actionable mutation diagnosed in May 2022.  The patient has also history of systemic lupus erythematosus and she is not a candidate for immunotherapy. She is currently undergoing  systemic chemotherapy with carboplatin for AUC of 5, Alimta 500 Mg/M2 and Avastin 15 Mg/KG every 3 weeks status post 25 cycles.  Starting from cycle #7 the patient is on maintenance treatment with Alimta and Avastin every 3 weeks. The patient continues to tolerate her treatment fairly well with no concerning adverse effects. I recommended for her to proceed with cycle #26 today as planned. I will see her back for follow-up visit in 3 weeks for evaluation before the next cycle of her treatment. For the hypertension she was advised to monitor it closely at home and to discuss with her primary care physician for any treatment adjustment. She was advised to call immediately if she has any other concerning symptoms in the interval. The patient voices understanding of current disease status and treatment options and is in agreement with the current care plan.  All questions were answered. The patient knows to call the clinic with any problems, questions or concerns. We can certainly see the patient much sooner if necessary.  Disclaimer: This note was dictated with voice recognition software. Similar sounding words can inadvertently be transcribed and may not be corrected upon review.

## 2022-12-05 NOTE — Patient Instructions (Signed)
[facility]   Discharge Instructions: Thank you for choosing [facility] to provide your oncology and hematology care.   If you have a lab appointment with the [facility], please go directly to the [facility] and check in at the registration area.   Wear comfortable clothing and clothing appropriate for easy access to any Portacath or PICC line.   We strive to give you quality time with your provider. You may need to reschedule your appointment if you arrive late (15 or more minutes).  Arriving late affects you and other patients whose appointments are after yours.  Also, if you miss three or more appointments without notifying the office, you may be dismissed from the clinic at the provider's discretion.      For prescription refill requests, have your pharmacy contact our office and allow 72 hours for refills to be completed.    Today you received the following chemotherapy and/or immunotherapy agents: bevacizumab-awwb and pemetrexed      To help prevent nausea and vomiting after your treatment, we encourage you to take your nausea medication as directed.  BELOW ARE SYMPTOMS THAT SHOULD BE REPORTED IMMEDIATELY: *FEVER GREATER THAN 100.4 F (38 C) OR HIGHER *CHILLS OR SWEATING *NAUSEA AND VOMITING THAT IS NOT CONTROLLED WITH YOUR NAUSEA MEDICATION *UNUSUAL SHORTNESS OF BREATH *UNUSUAL BRUISING OR BLEEDING *URINARY PROBLEMS (pain or burning when urinating, or frequent urination) *BOWEL PROBLEMS (unusual diarrhea, constipation, pain near the anus) TENDERNESS IN MOUTH AND THROAT WITH OR WITHOUT PRESENCE OF ULCERS (sore throat, sores in mouth, or a toothache) UNUSUAL RASH, SWELLING OR PAIN  UNUSUAL VAGINAL DISCHARGE OR ITCHING   Items with * indicate a potential emergency and should be followed up as soon as possible or go to the Emergency Department if any problems should occur.  Please show the CHEMOTHERAPY ALERT CARD or IMMUNOTHERAPY  ALERT CARD at check-in to the Emergency Department and triage nurse.  Should you have questions after your visit or need to cancel or reschedule your appointment, please contact [facility]  Dept: [phone]  and follow the prompts.  Office hours are 8:00 a.m. to 4:30 p.m. Monday - Friday. Please note that voicemails left after 4:00 p.m. may not be returned until the following business day.  We are closed weekends and major holidays. You have access to a nurse at all times for urgent questions. Please call the main number to the clinic Dept: [phone] and follow the prompts.   For any non-urgent questions, you may also contact your provider using MyChart. We now offer e-Visits for anyone 53 and older to request care online for non-urgent symptoms. For details visit [email or web address].   Also download the MyChart app! Go to the app store, search "MyChart", open the app, select [facility], and log in with your MyChart username and password.  Masks are optional in the cancer centers. If you would like for your care team to wear a mask while they are taking care of you, please let them know. You may have one support person who is at least 52 years old accompany you for your appointments.

## 2022-12-19 MED ORDER — LIDOCAINE HCL 1 % IJ SOLN
INTRAMUSCULAR | Status: AC
  Filled 2022-12-19: qty 20

## 2022-12-23 NOTE — Progress Notes (Signed)
[facility] OFFICE PROGRESS NOTE  [name], PA-C [address] [place] [address]  DIAGNOSIS: Stage IV (T4, N2, M1 a) non-small cell lung cancer, adenocarcinoma presented with multifocal disease involving the right upper lobe, right lower lobe as well as suspicious lower paratracheal lymphadenopathy and groundglass nodules in the left lung diagnosed in May 2022. The patient had molecular studies performed by [facility] and that showed no detectable mutation but this is likely secondary to low-level circulating free tumor DNA.   PRIOR THERAPY: None  CURRENT THERAPY:  Palliative systemic chemotherapy with carboplatin for AUC of 5, Alimta 500 Mg/M2 and Avastin 15 Mg/KG every 3 weeks.  The patient is not a great candidate for immunotherapy in the event that she has a genetic mutation.  She is status post 26 cycles. Starting from cycle #6, the patient will start maintenance Alimta and Avastin.   INTERVAL HISTORY: Kirsten Williams 52 y.o. female returns to the clinic today for a follow-up visit. The patient is feeling fairly well today without any concerning complaints. She mentions she went to the dentist and desperately needs dental implants/teeth extractions.  She is hoping to have this performed in the next 3 months or so.  She is wondering if she be able to get permission from the office to have this performed.   She is tolerating her treatment fairly well except she does sometimes have fatigue the week following treatment. She denies any fever or chills.  She has occasional night sweats which is not new for her she has these intermittently.  She reports a fine appetite. She denies any significant shortness of breath unless with strenuous activity. Denies chest pain. She reports she sometimes a mild cough but states "nothing major".  A few days following treatment she sometimes has a tightness around her diaphragm.  She is prone to headaches due to history of migraines.   Denies any changes with her bowel habits. She sometimes develops a rash for which her dermatologist gave her cream which controlling her rash. She is here for evaluation and repeat blood work before starting cycle #27  MEDICAL HISTORY: Past Medical History:  Diagnosis Date   Adenocarcinoma, lung, right (HCC) 05/03/2021   Kirsten Williams is a 52 y.o. female with a history of lung nodules dating back to 2019 who is referred in consultation with [name], PA-C for assessment and management. She is a former smoker having quit in 2016. 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 She missed her screening CT last year and imaging was ordered last month. CT chest from 02-16-2021 reveals pro   Anxiety    GERD (gastroesophageal reflux disease)    SLE (systemic lupus erythematosus) (HCC) 05/03/2021   SLE (systemic lupus erythematosus) (HCC) 05/03/2021    ALLERGIES:  is allergic to clindamycin/lincomycin and carboplatin.  MEDICATIONS:  Current Outpatient Medications  Medication Sig Dispense Refill   B Complex Vitamins (B COMPLEX 50 PO) Take by mouth.     Calcium Carbonate-Vit D-Min (CALCIUM 1200 PO) Take 1 capsule by mouth daily.     cholecalciferol (VITAMIN D3) 25 MCG (1000 UNIT) tablet Take 2,000 Units by mouth daily.     cyclobenzaprine (FLEXERIL) 10 MG tablet Take 10 mg by mouth at bedtime as needed for muscle spasms.     denosumab (PROLIA) 60 MG/ML SOSY injection Inject 60 mg into the skin every 6 (six) months.     dexamethasone (DECADRON) 4 MG tablet 4 mg  p.o. twice daily the day before, day of and day after the chemotherapy every 3 weeks. 40 tablet 2   dicyclomine (BENTYL) 10 MG capsule Take 10 mg by mouth every 6 (six) hours as needed.     diphenhydrAMINE (BENADRYL) 25 MG tablet Take 25 mg by mouth daily as needed for allergies.     famotidine (PEPCID) 20 MG tablet Take 20 mg by mouth at bedtime.     folic acid (FOLVITE) 1 MG tablet TAKE 1 TABLET(1 MG) BY MOUTH  DAILY 30 tablet 4   gabapentin (NEURONTIN) 100 MG capsule Take 100 mg by mouth 3 (three) times daily.     hydrocortisone 1 % lotion Apply 1 application topically 2 (two) times daily. 118 mL 0   hydroxychloroquine (PLAQUENIL) 200 MG tablet Take 200 mg by mouth 2 (two) times daily.     Multiple Vitamins-Minerals (MULTI ADULT GUMMIES) CHEW Chew 2 tablets by mouth daily.     omeprazole (PRILOSEC) 40 MG capsule Take 40 mg by mouth daily as needed (acid reflux).     oxyCODONE-acetaminophen (PERCOCET/ROXICET) 5-325 MG tablet Take 1 tablet by mouth every 8 (eight) hours as needed for severe pain. 30 tablet 0   Probiotic Product (PROBIOTIC-10 PO) Take by mouth.     prochlorperazine (COMPAZINE) 10 MG tablet TAKE 1 TABLET(10 MG) BY MOUTH EVERY 6 HOURS AS NEEDED 30 tablet 2   rizatriptan (MAXALT-MLT) 10 MG disintegrating tablet Take 10 mg by mouth 2 (two) times daily as needed.     tetrahydrozoline 0.05 % ophthalmic solution Place 1 drop into both eyes once. Systane Eye Drops once daily both eyes     triamcinolone ointment (KENALOG) 0.5 % Apply topically 3 (three) times daily.     No current facility-administered medications for this visit.    SURGICAL HISTORY:  Past Surgical History:  Procedure Laterality Date   ABDOMINAL HYSTERECTOMY     BRONCHIAL BIOPSY  04/24/2021   Procedure: BRONCHIAL BIOPSIES;  Surgeon: Leslye Peer, MD;  Location: [facility] ENDOSCOPY;  Service: Pulmonary;;   BRONCHIAL BRUSHINGS  04/24/2021   Procedure: BRONCHIAL BRUSHINGS;  Surgeon: Leslye Peer, MD;  Location: [facility] ENDOSCOPY;  Service: Pulmonary;;   BRONCHIAL NEEDLE ASPIRATION BIOPSY  04/24/2021   Procedure: BRONCHIAL NEEDLE ASPIRATION BIOPSIES;  Surgeon: Leslye Peer, MD;  Location: [facility] ENDOSCOPY;  Service: Pulmonary;;   BRONCHIAL WASHINGS  04/24/2021   Procedure: BRONCHIAL WASHINGS;  Surgeon: Leslye Peer, MD;  Location[facility] ENDOSCOPY;  Service: Pulmonary;;   CESAREAN SECTION  11/09/1990   DILATION AND CURETTAGE OF UTERUS  Bilateral 09/09/1996   FIDUCIAL MARKER PLACEMENT  04/24/2021   Procedure: FIDUCIAL MARKER PLACEMENT;  Surgeon: Leslye Peer, MD;  Location: [facility] ENDOSCOPY;  Service: Pulmonary;;   TONSILLECTOMY  12/10/1977   VIDEO BRONCHOSCOPY WITH ENDOBRONCHIAL NAVIGATION Bilateral 04/24/2021   Procedure: VIDEO BRONCHOSCOPY WITH ENDOBRONCHIAL NAVIGATION;  Surgeon: Leslye Peer, MD;  Location: [facility] ENDOSCOPY;  Service: Pulmonary;  Laterality: Bilateral;    REVIEW OF SYSTEMS:   Constitutional: Positive for fatigue 1 week following treatment. Negative for appetite change, chills, fever and unexpected weight change.  HENT: Negative for mouth sores, nosebleeds, sore throat and trouble swallowing.   Eyes: Negative for eye problems and icterus.  Respiratory: Positive for shortness of breath. Negative for hemoptysis and wheezing.   Cardiovascular: . Negative for chest pain and leg swelling.  Gastrointestinal: Positive for band-like sensation around diaphragm. Negative for diarrhea, nausea, vomiting, diarrhea, and constipation.  Genitourinary: Negative for bladder incontinence, difficulty urinating, dysuria, frequency  and hematuria.   Musculoskeletal:  Positive for occasional sharp shooting pain down her foot.Negative for back pain, gait problem, neck pain and neck stiffness.  Skin: Negative for rash or itching. .  Neurological: Negative for dizziness, extremity weakness, gait problem, headaches, light-headedness and seizures.  Hematological: Negative for adenopathy. Does not bruise/bleed easily.  Psychiatric/Behavioral: Negative for confusion, depression and sleep disturbance. The patient is not nervous/anxious.   PHYSICAL EXAMINATION:  Blood pressure (!) 166/93, pulse 88, temperature 97.7 F (36.5 C), temperature source Oral, resp. rate 17, weight 116 lb 3 oz (52.7 kg), SpO2 100 %.  ECOG PERFORMANCE STATUS: 1 - Symptomatic but completely ambulatory  Physical Exam  Constitutional: Oriented to person, place,  and time and well-developed, well-nourished, and in no distress.  HENT:  Head: Normocephalic and atraumatic.  Mouth/Throat: Oropharynx is clear and moist. No oropharyngeal exudate.  Eyes: Conjunctivae are normal. Right eye exhibits no discharge. Left eye exhibits no discharge. No scleral icterus.  Neck: Normal range of motion. Neck supple.  Cardiovascular: Normal rate, regular rhythm, normal heart sounds and intact distal pulses.   Pulmonary/Chest: Effort normal and breath sounds normal. No respiratory distress. No wheezes. No rales. Inspiration was limited due to pain.  Abdominal: Soft. Bowel sounds are normal. Exhibits no distension and no mass. There is no tenderness.  Musculoskeletal: Normal range of motion. Lymphadenopathy:    No cervical adenopathy.  Neurological: Alert and oriented to person, place, and time. Exhibits normal muscle tone. Gait normal. Coordination normal.  Skin: Skin is warm and dry. No rash noted. Not diaphoretic. No erythema. No pallor.  Psychiatric: Mood, memory and judgment normal.  Vitals reviewed.  LABORATORY DATA: Lab Results  Component Value Date   WBC 12.7 (H) 12/26/2022   HGB 13.6 12/26/2022   HCT 41.2 12/26/2022   MCV 96.5 12/26/2022   PLT 266 12/26/2022      Chemistry      Component Value Date/Time   NA 137 12/26/2022 0732   NA 140 03/13/2021 0000   K 3.2 (L) 12/26/2022 0732   CL 103 12/26/2022 0732   CO2 24 12/26/2022 0732   BUN 7 12/26/2022 0732   BUN 10 03/13/2021 0000   CREATININE 0.81 12/26/2022 0732   GLU 107 03/13/2021 0000      Component Value Date/Time   CALCIUM 9.4 12/26/2022 0732   ALKPHOS 106 12/26/2022 0732   AST 31 12/26/2022 0732   ALT 25 12/26/2022 0732   BILITOT 0.5 12/26/2022 0732       RADIOGRAPHIC STUDIES:  No results found.   ASSESSMENT/PLAN:  This is a very pleasant 52 year old Caucasian female diagnosed with stage IV (T4, N2, M1 a) non-small cell lung cancer, adenocarcinoma presented with multifocal  disease involving the right upper lobe, right lower lobe as well as suspicious lower paratracheal lymphadenopathy and groundglass nodules in the left lung diagnosed in May 2022. The patient had molecular studies performed by [facility] and that showed no detectable mutation but this is likely secondary to low-level circulating free tumor DNA. If the patient has disease progression in the future, then we will likely retest her for molecular studies.   The patient is currently undergoing systemic chemotherapy with carboplatin for an AUC 5, Alimta 500 mg per metered square, and and Avastin 1500 mg/kg IV every 3 weeks.  She is status post 26 cycles.  Starting from cycle #7, the patient has been on maintenance treatment with Alimta and Avastin.  Labs were reviewed.  Recommend that she proceed with cycle #  27 today as scheduled.   We will see her back for follow-up visit in 3 weeks for evaluation and repeat blood work before starting cycle #22.   I will arrange for restaging CT scan of the chest, abdomen, pelvis prior at her next appointment.   She will continue to use her lotion prescribed by her dermatologist.    Dr. Arbutus Ped, we will hold Avastin for 3 to 4 weeks prior to her dental implants and extractions.  The patient will call if interested in trying to get the procedure scheduled for February for March.  She will call us if she is not exactly immunologist with complaint appointment.  Her urine protein is 100.  Okay to treat today.  I will send a prescription for potassium chloride 20 mill equivalents daily for 5 days to her pharmacy.  The patient was advised to call immediately if she has any concerning symptoms in the interval. The patient voices understanding of current disease status and treatment options and is in agreement with the current care plan. All questions were answered. The patient knows to call the clinic with any problems, questions or concerns. We can certainly see the  patient much sooner if necessary             No orders of the defined types were placed in this encounter.    The total time spent in the appointment was 20-29 minutes.   Shela Esses L Nashaun Hillmer, PA-C 12/26/22

## 2022-12-25 MED FILL — Dexamethasone Sodium Phosphate Inj 100 MG/10ML: INTRAMUSCULAR | Qty: 1 | Status: AC

## 2022-12-26 ENCOUNTER — Inpatient Hospital Stay: Payer: Commercial Managed Care - PPO

## 2022-12-26 ENCOUNTER — Other Ambulatory Visit: Payer: Self-pay

## 2022-12-26 ENCOUNTER — Inpatient Hospital Stay: Payer: Commercial Managed Care - PPO | Admitting: Physician Assistant

## 2022-12-26 ENCOUNTER — Inpatient Hospital Stay: Payer: Commercial Managed Care - PPO | Attending: Hematology and Oncology

## 2022-12-26 VITALS — BP 166/93 | HR 88 | Temp 97.7°F | Resp 17 | Wt 116.2 lb

## 2022-12-26 VITALS — BP 159/90 | HR 97 | Temp 98.3°F | Resp 15

## 2022-12-26 DIAGNOSIS — Z5112 Encounter for antineoplastic immunotherapy: Secondary | ICD-10-CM | POA: Insufficient documentation

## 2022-12-26 DIAGNOSIS — C3491 Malignant neoplasm of unspecified part of right bronchus or lung: Secondary | ICD-10-CM

## 2022-12-26 DIAGNOSIS — C3411 Malignant neoplasm of upper lobe, right bronchus or lung: Secondary | ICD-10-CM | POA: Insufficient documentation

## 2022-12-26 DIAGNOSIS — Z79899 Other long term (current) drug therapy: Secondary | ICD-10-CM | POA: Diagnosis not present

## 2022-12-26 DIAGNOSIS — E876 Hypokalemia: Secondary | ICD-10-CM

## 2022-12-26 DIAGNOSIS — M329 Systemic lupus erythematosus, unspecified: Secondary | ICD-10-CM | POA: Diagnosis not present

## 2022-12-26 DIAGNOSIS — Z5111 Encounter for antineoplastic chemotherapy: Secondary | ICD-10-CM

## 2022-12-26 LAB — CBC WITH DIFFERENTIAL (CANCER CENTER ONLY)
Abs Immature Granulocytes: 0.06 10*3/uL (ref 0.00–0.07)
Basophils Absolute: 0.1 10*3/uL (ref 0.0–0.1)
Basophils Relative: 1 %
Eosinophils Absolute: 0.1 10*3/uL (ref 0.0–0.5)
Eosinophils Relative: 1 %
HCT: 41.2 % (ref 36.0–46.0)
Hemoglobin: 13.6 g/dL (ref 12.0–15.0)
Immature Granulocytes: 1 %
Lymphocytes Relative: 6 %
Lymphs Abs: 0.8 10*3/uL (ref 0.7–4.0)
MCH: 31.9 pg (ref 26.0–34.0)
MCHC: 33 g/dL (ref 30.0–36.0)
MCV: 96.5 fL (ref 80.0–100.0)
Monocytes Absolute: 0.4 10*3/uL (ref 0.1–1.0)
Monocytes Relative: 3 %
Neutro Abs: 11.4 10*3/uL — ABNORMAL HIGH (ref 1.7–7.7)
Neutrophils Relative %: 88 %
Platelet Count: 266 10*3/uL (ref 150–400)
RBC: 4.27 MIL/uL (ref 3.87–5.11)
RDW: 16.2 % — ABNORMAL HIGH (ref 11.5–15.5)
WBC Count: 12.7 10*3/uL — ABNORMAL HIGH (ref 4.0–10.5)
nRBC: 0 % (ref 0.0–0.2)

## 2022-12-26 LAB — CMP (CANCER CENTER ONLY)
ALT: 25 U/L (ref 0–44)
AST: 31 U/L (ref 15–41)
Albumin: 3.6 g/dL (ref 3.5–5.0)
Alkaline Phosphatase: 106 U/L (ref 38–126)
Anion gap: 10 (ref 5–15)
BUN: 7 mg/dL (ref 6–20)
CO2: 24 mmol/L (ref 22–32)
Calcium: 9.4 mg/dL (ref 8.9–10.3)
Chloride: 103 mmol/L (ref 98–111)
Creatinine: 0.81 mg/dL (ref 0.44–1.00)
GFR, Estimated: >60 mL/min (ref >60)
Glucose, Bld: 137 mg/dL — ABNORMAL HIGH (ref 70–99)
Potassium: 3.2 mmol/L — ABNORMAL LOW (ref 3.5–5.1)
Sodium: 137 mmol/L (ref 135–145)
Total Bilirubin: 0.5 mg/dL (ref 0.3–1.2)
Total Protein: 6.9 g/dL (ref 6.5–8.1)

## 2022-12-26 LAB — TOTAL PROTEIN, URINE DIPSTICK: Protein, ur: 100 mg/dL — AB

## 2022-12-26 MED ORDER — PROCHLORPERAZINE MALEATE 10 MG PO TABS
10.0000 mg | ORAL_TABLET | Freq: Four times a day (QID) | ORAL | Status: DC | PRN
  Administered 2022-12-26: 10 mg via ORAL
  Filled 2022-12-26: qty 1

## 2022-12-26 MED ORDER — CYANOCOBALAMIN 1000 MCG/ML IJ SOLN: 1000.0000 ug | Freq: Once | INTRAMUSCULAR | Status: DC

## 2022-12-26 MED ORDER — SODIUM CHLORIDE 0.9 % IV SOLN
10.0000 mg | Freq: Once | INTRAVENOUS | Status: AC
  Administered 2022-12-26: 10 mg via INTRAVENOUS
  Filled 2022-12-26: qty 10

## 2022-12-26 MED ORDER — SODIUM CHLORIDE 0.9 % IV SOLN
500.0000 mg/m2 | Freq: Once | INTRAVENOUS | Status: AC
  Administered 2022-12-26: 800 mg via INTRAVENOUS
  Filled 2022-12-26: qty 20

## 2022-12-26 MED ORDER — POTASSIUM CHLORIDE CRYS ER 20 MEQ PO TBCR: 20.0000 meq | EXTENDED_RELEASE_TABLET | Freq: Every day | ORAL | 0 refills | Status: DC

## 2022-12-26 MED ORDER — SODIUM CHLORIDE 0.9 % IV SOLN
15.0000 mg/kg | Freq: Once | INTRAVENOUS | Status: AC
  Administered 2022-12-26: 800 mg via INTRAVENOUS
  Filled 2022-12-26: qty 32

## 2022-12-26 MED ORDER — SODIUM CHLORIDE 0.9 % IV SOLN: Freq: Once | INTRAVENOUS | Status: AC

## 2022-12-26 NOTE — Addendum Note (Signed)
Addended by: Amaryllis Dyke on: 12/26/2022 03:56 PM   Modules accepted: Orders

## 2022-12-26 NOTE — Patient Instructions (Signed)
[facility]   Discharge Instructions: Thank you for choosing [facility] to provide your oncology and hematology care.   If you have a lab appointment with the Cancer Center, please go directly to the Cancer Center and check in at the registration area.   Wear comfortable clothing and clothing appropriate for easy access to any Portacath or PICC line.   We strive to give you quality time with your provider. You may need to reschedule your appointment if you arrive late (15 or more minutes).  Arriving late affects you and other patients whose appointments are after yours.  Also, if you miss three or more appointments without notifying the office, you may be dismissed from the clinic at the provider's discretion.      For prescription refill requests, have your pharmacy contact our office and allow 72 hours for refills to be completed.    Today you received the following chemotherapy and/or immunotherapy agents: bevacizumab-awwb and pemetrexed      To help prevent nausea and vomiting after your treatment, we encourage you to take your nausea medication as directed.  BELOW ARE SYMPTOMS THAT SHOULD BE REPORTED IMMEDIATELY: *FEVER GREATER THAN 100.4 F (38 C) OR HIGHER *CHILLS OR SWEATING *NAUSEA AND VOMITING THAT IS NOT CONTROLLED WITH YOUR NAUSEA MEDICATION *UNUSUAL SHORTNESS OF BREATH *UNUSUAL BRUISING OR BLEEDING *URINARY PROBLEMS (pain or burning when urinating, or frequent urination) *BOWEL PROBLEMS (unusual diarrhea, constipation, pain near the anus) TENDERNESS IN MOUTH AND THROAT WITH OR WITHOUT PRESENCE OF ULCERS (sore throat, sores in mouth, or a toothache) UNUSUAL RASH, SWELLING OR PAIN  UNUSUAL VAGINAL DISCHARGE OR ITCHING   Items with * indicate a potential emergency and should be followed up as soon as possible or go to the Emergency Department if any problems should occur.  Please show the CHEMOTHERAPY ALERT CARD or IMMUNOTHERAPY  ALERT CARD at check-in to the Emergency Department and triage nurse.  Should you have questions after your visit or need to cancel or reschedule your appointment, please contact [facility]  Dept: [phone]  and follow the prompts.  Office hours are 8:00 a.m. to 4:30 p.m. Monday - Friday. Please note that voicemails left after 4:00 p.m. may not be returned until the following business day.  We are closed weekends and major holidays. You have access to a nurse at all times for urgent questions. Please call the main number to the clinic Dept: [phone] and follow the prompts.   For any non-urgent questions, you may also contact your provider using MyChart. We now offer e-Visits for anyone 2 and older to request care online for non-urgent symptoms. For details visit [email or web address].   Also download the MyChart app! Go to the app store, search "MyChart", open the app, select [place], and log in with your MyChart username and password.  Masks are optional in the cancer centers. If you would like for your care team to wear a mask while they are taking care of you, please let them know. You may have one support person who is at least 53 years old accompany you for your appointments.

## 2022-12-26 NOTE — Progress Notes (Signed)
Urine protein 100. OK to treat D1/C27 Bevacizumab/almita per [place] PA

## 2022-12-28 ENCOUNTER — Other Ambulatory Visit: Payer: Self-pay | Admitting: Physician Assistant

## 2022-12-28 DIAGNOSIS — E876 Hypokalemia: Secondary | ICD-10-CM

## 2022-12-28 NOTE — Telephone Encounter (Signed)
Duplicate

## 2022-12-30 ENCOUNTER — Other Ambulatory Visit: Payer: Self-pay | Admitting: Physician Assistant

## 2022-12-30 DIAGNOSIS — E876 Hypokalemia: Secondary | ICD-10-CM

## 2023-01-08 ENCOUNTER — Telehealth: Payer: Self-pay | Admitting: Internal Medicine

## 2023-01-08 NOTE — Telephone Encounter (Signed)
Rescheduled 02/28 appointment due to provider pal, patient has been called and voicemail was left.

## 2023-01-13 NOTE — Progress Notes (Unsigned)
[facility] OFFICE PROGRESS NOTE  Kirsten Bender, PA-C [place] [address]  DIAGNOSIS: Stage IV (T4, N2, M1 a) non-small cell lung cancer, adenocarcinoma presented with multifocal disease involving the right upper lobe, right lower lobe as well as suspicious lower paratracheal lymphadenopathy and groundglass nodules in the left lung diagnosed in May 2022. The patient had molecular studies performed by [facility] and that showed no detectable mutation but this is likely secondary to low-level circulating free tumor DNA.   PRIOR THERAPY: None  CURRENT THERAPY:  Palliative systemic chemotherapy with carboplatin for AUC of 5, Alimta 500 Mg/M2 and Avastin 15 Mg/KG every 3 weeks.  The patient is not a great candidate for immunotherapy in the event that she has a genetic mutation.  She is status post 27 cycles. Starting from cycle #6, the patient will start maintenance Alimta and Avastin.    INTERVAL HISTORY: Kirsten Williams 52 y.o. female returns to the clinic today for a follow-up visit. The patient is feeling fairly well today without any concerning complaints. She is scheduled for dental implants on ***. Therefore, we will hold her avastin ***    She is tolerating her treatment fairly well except she does sometimes have fatigue the week following treatment. She denies any fever or chills.  She has occasional night sweats which is not new for her she has these intermittently.  She reports a fine appetite. She denies any significant shortness of breath unless with strenuous activity. Denies chest pain. She reports she sometimes a mild cough but states "nothing major".  A few days following treatment she sometimes has a tightness around her diaphragm which has been occurring since starting treatment.  She is prone to headaches due to history of migraines.  Denies any changes with her bowel habits. She sometimes develops a rash for which her dermatologist gave her cream  which controlling her rash. She is here for evaluation and repeat blood work before starting cycle #28  MEDICAL HISTORY: Past Medical History:  Diagnosis Date   Adenocarcinoma, lung, right ([place]) 05/03/2021   Kirsten Williams is a 52 y.o. female with a history of lung nodules dating back to 2019 who is referred in consultation with Kirsten Bender, PA-C for assessment and management. She is a former smoker having quit in 2016. 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 She missed her screening CT last year and imaging was ordered last month. CT chest from 02-16-2021 reveals pro   Anxiety    GERD (gastroesophageal reflux disease)    SLE (systemic lupus erythematosus) ([place]) 05/03/2021   SLE (systemic lupus erythematosus) ([place]) 05/03/2021    ALLERGIES:  is allergic to clindamycin/lincomycin and carboplatin.  MEDICATIONS:  Current Outpatient Medications  Medication Sig Dispense Refill   B Complex Vitamins (B COMPLEX 50 PO) Take by mouth.     Calcium Carbonate-Vit D-Min (CALCIUM 1200 PO) Take 1 capsule by mouth daily.     cholecalciferol (VITAMIN D3) 25 MCG (1000 UNIT) tablet Take 2,000 Units by mouth daily.     cyclobenzaprine (FLEXERIL) 10 MG tablet Take 10 mg by mouth at bedtime as needed for muscle spasms.     denosumab (PROLIA) 60 MG/ML SOSY injection Inject 60 mg into the skin every 6 (six) months.     dexamethasone (DECADRON) 4 MG tablet 4 mg p.o. twice daily the day before, day of and day after the chemotherapy every 3 weeks. 40 tablet 2   dicyclomine (  BENTYL) 10 MG capsule Take 10 mg by mouth every 6 (six) hours as needed.     diphenhydrAMINE (BENADRYL) 25 MG tablet Take 25 mg by mouth daily as needed for allergies.     famotidine (PEPCID) 20 MG tablet Take 20 mg by mouth at bedtime.     folic acid (FOLVITE) 1 MG tablet TAKE 1 TABLET(1 MG) BY MOUTH DAILY 30 tablet 4   gabapentin (NEURONTIN) 100 MG capsule Take 100 mg by mouth 3 (three) times daily.      hydrocortisone 1 % lotion Apply 1 application topically 2 (two) times daily. 118 mL 0   hydroxychloroquine (PLAQUENIL) 200 MG tablet Take 200 mg by mouth 2 (two) times daily.     Multiple Vitamins-Minerals (MULTI ADULT GUMMIES) CHEW Chew 2 tablets by mouth daily.     omeprazole (PRILOSEC) 40 MG capsule Take 40 mg by mouth daily as needed (acid reflux).     oxyCODONE-acetaminophen (PERCOCET/ROXICET) 5-325 MG tablet Take 1 tablet by mouth every 8 (eight) hours as needed for severe pain. 30 tablet 0   potassium chloride SA (KLOR-CON M) 20 MEQ tablet Take 1 tablet (20 mEq total) by mouth daily. 5 tablet 0   Probiotic Product (PROBIOTIC-10 PO) Take by mouth.     prochlorperazine (COMPAZINE) 10 MG tablet TAKE 1 TABLET(10 MG) BY MOUTH EVERY 6 HOURS AS NEEDED 30 tablet 2   rizatriptan (MAXALT-MLT) 10 MG disintegrating tablet Take 10 mg by mouth 2 (two) times daily as needed.     tetrahydrozoline 0.05 % ophthalmic solution Place 1 drop into both eyes once. Systane Eye Drops once daily both eyes     triamcinolone ointment (KENALOG) 0.5 % Apply topically 3 (three) times daily.     No current facility-administered medications for this visit.    SURGICAL HISTORY:  Past Surgical History:  Procedure Laterality Date   ABDOMINAL HYSTERECTOMY     BRONCHIAL BIOPSY  04/24/2021   Procedure: BRONCHIAL BIOPSIES;  Surgeon: Kirsten Gobble, MD;  Location: [facility] ENDOSCOPY;  Service: Pulmonary;;   BRONCHIAL BRUSHINGS  04/24/2021   Procedure: BRONCHIAL BRUSHINGS;  Surgeon: Kirsten Gobble, MD;  Location[facility] ENDOSCOPY;  Service: Pulmonary;;   BRONCHIAL NEEDLE ASPIRATION BIOPSY  04/24/2021   Procedure: BRONCHIAL NEEDLE ASPIRATION BIOPSIES;  Surgeon: Kirsten Gobble, MD;  Location: [facility];  Service: Pulmonary;;   BRONCHIAL WASHINGS  04/24/2021   Procedure: BRONCHIAL WASHINGS;  Surgeon: Kirsten Gobble, MD;  Location: [facility];  Service: Pulmonary;;   CESAREAN SECTION  11/09/1990   DILATION AND CURETTAGE OF UTERUS  Bilateral 09/09/1996   FIDUCIAL MARKER PLACEMENT  04/24/2021   Procedure: FIDUCIAL MARKER PLACEMENT;  Surgeon: Kirsten Gobble, MD;  Location: [facility] ENDOSCOPY;  Service: Pulmonary;;   TONSILLECTOMY  12/10/1977   VIDEO BRONCHOSCOPY WITH ENDOBRONCHIAL NAVIGATION Bilateral 04/24/2021   Procedure: VIDEO BRONCHOSCOPY WITH ENDOBRONCHIAL NAVIGATION;  Surgeon: Kirsten Gobble, MD;  Location: [facility] ENDOSCOPY;  Service: Pulmonary;  Laterality: Bilateral;    REVIEW OF SYSTEMS:   Review of Systems  Constitutional: Negative for appetite change, chills, fatigue, fever and unexpected weight change.  HENT:   Negative for mouth sores, nosebleeds, sore throat and trouble swallowing.   Eyes: Negative for eye problems and icterus.  Respiratory: Negative for cough, hemoptysis, shortness of breath and wheezing.   Cardiovascular: Negative for chest pain and leg swelling.  Gastrointestinal: Negative for abdominal pain, constipation, diarrhea, nausea and vomiting.  Genitourinary: Negative for bladder incontinence, difficulty urinating, dysuria, frequency and hematuria.   Musculoskeletal: Negative for  back pain, gait problem, neck pain and neck stiffness.  Skin: Negative for itching and rash.  Neurological: Negative for dizziness, extremity weakness, gait problem, headaches, light-headedness and seizures.  Hematological: Negative for adenopathy. Does not bruise/bleed easily.  Psychiatric/Behavioral: Negative for confusion, depression and sleep disturbance. The patient is not nervous/anxious.     PHYSICAL EXAMINATION:  There were no vitals taken for this visit.  ECOG PERFORMANCE STATUS: {CHL ONC ECOG [id number]  Physical Exam  Constitutional: Oriented to person, place, and time and well-developed, well-nourished, and in no distress. No distress.  HENT:  Head: Normocephalic and atraumatic.  Mouth/Throat: Oropharynx is clear and moist. No oropharyngeal exudate.  Eyes: Conjunctivae are normal. Right eye exhibits no  discharge. Left eye exhibits no discharge. No scleral icterus.  Neck: Normal range of motion. Neck supple.  Cardiovascular: Normal rate, regular rhythm, normal heart sounds and intact distal pulses.   Pulmonary/Chest: Effort normal and breath sounds normal. No respiratory distress. No wheezes. No rales.  Abdominal: Soft. Bowel sounds are normal. Exhibits no distension and no mass. There is no tenderness.  Musculoskeletal: Normal range of motion. Exhibits no edema.  Lymphadenopathy:    No cervical adenopathy.  Neurological: Alert and oriented to person, place, and time. Exhibits normal muscle tone. Gait normal. Coordination normal.  Skin: Skin is warm and dry. No rash noted. Not diaphoretic. No erythema. No pallor.  Psychiatric: Mood, memory and judgment normal.  Vitals reviewed.  LABORATORY DATA: Lab Results  Component Value Date   WBC 12.7 (H) 12/26/2022   HGB 13.6 12/26/2022   HCT 41.2 12/26/2022   MCV 96.5 12/26/2022   PLT 266 12/26/2022      Chemistry      Component Value Date/Time   NA 137 12/26/2022 0732   NA 140 03/13/2021 0000   K 3.2 (L) 12/26/2022 0732   CL 103 12/26/2022 0732   CO2 24 12/26/2022 0732   BUN 7 12/26/2022 0732   BUN 10 03/13/2021 0000   CREATININE 0.81 12/26/2022 0732   GLU 107 03/13/2021 0000      Component Value Date/Time   CALCIUM 9.4 12/26/2022 0732   ALKPHOS 106 12/26/2022 0732   AST 31 12/26/2022 0732   ALT 25 12/26/2022 0732   BILITOT 0.5 12/26/2022 0732       RADIOGRAPHIC STUDIES:  No results found.   ASSESSMENT/PLAN:  This is a very pleasant 52 year old Caucasian female diagnosed with stage IV (T4, N2, M1 a) non-small cell lung cancer, adenocarcinoma presented with multifocal disease involving the right upper lobe, right lower lobe as well as suspicious lower paratracheal lymphadenopathy and groundglass nodules in the left lung diagnosed in May 2022. The patient had molecular studies performed by [facility] and that showed no  detectable mutation but this is likely secondary to low-level circulating free tumor DNA. If the patient has disease progression in the future, then we will likely retest her for molecular studies.   The patient is currently undergoing systemic chemotherapy with carboplatin for an AUC 5, Alimta 500 mg per metered square, and and Avastin 1500 mg/kg IV every 3 weeks.  She is status post 27 cycles.  Starting from cycle #7, the patient has been on maintenance treatment with Alimta and Avastin.   Labs were reviewed.  Recommend that she proceed with cycle #28 today as scheduled. ***Hold avastin??*****Kirsten Williams, we will hold Avastin for 3 to 4 weeks prior to her dental implants and extractions.   I will arrange for a restaging CT scan of the  CAP prior to her next visit.   We will see her back for follow-up visit in 3 weeks for evaluation and to review her scan before starting cycle #29   Potassium  Urine protein.   The patient was advised to call immediately if she has any concerning symptoms in the interval. The patient voices understanding of current disease status and treatment options and is in agreement with the current care plan. All questions were answered. The patient knows to call the clinic with any problems, questions or concerns. We can certainly see the patient much sooner if necessary       No orders of the defined types were placed in this encounter.    I spent {CHL ONC TIME VISIT - KPQAE:[id number]} counseling the patient face to face. The total time spent in the appointment was {CHL ONC TIME VISIT - MYTRZ:[id number]}.  Seon Gaertner L Paolo [place]ane, PA-C 01/13/23

## 2023-01-15 ENCOUNTER — Other Ambulatory Visit: Payer: Self-pay

## 2023-01-15 MED FILL — Dexamethasone Sodium Phosphate Inj 100 MG/10ML: INTRAMUSCULAR | Qty: 1 | Status: AC

## 2023-01-16 ENCOUNTER — Inpatient Hospital Stay: Payer: Commercial Managed Care - PPO | Admitting: Physician Assistant

## 2023-01-16 ENCOUNTER — Inpatient Hospital Stay: Payer: Commercial Managed Care - PPO | Attending: Hematology and Oncology

## 2023-01-16 ENCOUNTER — Encounter: Payer: Self-pay | Admitting: Pulmonary Disease

## 2023-01-16 ENCOUNTER — Inpatient Hospital Stay: Payer: Commercial Managed Care - PPO

## 2023-01-16 ENCOUNTER — Encounter: Payer: Self-pay | Admitting: Internal Medicine

## 2023-01-16 ENCOUNTER — Other Ambulatory Visit: Payer: Self-pay

## 2023-01-16 VITALS — BP 142/78 | HR 87 | Temp 97.8°F | Resp 16 | Ht 61.0 in | Wt 120.3 lb

## 2023-01-16 DIAGNOSIS — Z5111 Encounter for antineoplastic chemotherapy: Secondary | ICD-10-CM

## 2023-01-16 DIAGNOSIS — C3491 Malignant neoplasm of unspecified part of right bronchus or lung: Secondary | ICD-10-CM

## 2023-01-16 DIAGNOSIS — G893 Neoplasm related pain (acute) (chronic): Secondary | ICD-10-CM | POA: Diagnosis not present

## 2023-01-16 DIAGNOSIS — M329 Systemic lupus erythematosus, unspecified: Secondary | ICD-10-CM | POA: Insufficient documentation

## 2023-01-16 DIAGNOSIS — Z79899 Other long term (current) drug therapy: Secondary | ICD-10-CM | POA: Diagnosis not present

## 2023-01-16 DIAGNOSIS — I10 Essential (primary) hypertension: Secondary | ICD-10-CM | POA: Insufficient documentation

## 2023-01-16 DIAGNOSIS — Z5112 Encounter for antineoplastic immunotherapy: Secondary | ICD-10-CM | POA: Diagnosis present

## 2023-01-16 DIAGNOSIS — S0990XS Unspecified injury of head, sequela: Secondary | ICD-10-CM

## 2023-01-16 DIAGNOSIS — C3411 Malignant neoplasm of upper lobe, right bronchus or lung: Secondary | ICD-10-CM | POA: Insufficient documentation

## 2023-01-16 DIAGNOSIS — G44309 Post-traumatic headache, unspecified, not intractable: Secondary | ICD-10-CM

## 2023-01-16 HISTORY — DX: Post-traumatic headache, unspecified, not intractable: S09.90XS

## 2023-01-16 LAB — CBC WITH DIFFERENTIAL (CANCER CENTER ONLY)
Abs Immature Granulocytes: 0.04 10*3/uL (ref 0.00–0.07)
Basophils Absolute: 0.1 10*3/uL (ref 0.0–0.1)
Basophils Relative: 1 %
Eosinophils Absolute: 0.1 10*3/uL (ref 0.0–0.5)
Eosinophils Relative: 1 %
HCT: 37.5 % (ref 36.0–46.0)
Hemoglobin: 11.9 g/dL — ABNORMAL LOW (ref 12.0–15.0)
Immature Granulocytes: 0 %
Lymphocytes Relative: 11 %
Lymphs Abs: 1.2 10*3/uL (ref 0.7–4.0)
MCH: 31.5 pg (ref 26.0–34.0)
MCHC: 31.7 g/dL (ref 30.0–36.0)
MCV: 99.2 fL (ref 80.0–100.0)
Monocytes Absolute: 0.3 10*3/uL (ref 0.1–1.0)
Monocytes Relative: 3 %
Neutro Abs: 9.5 10*3/uL — ABNORMAL HIGH (ref 1.7–7.7)
Neutrophils Relative %: 84 %
Platelet Count: 321 10*3/uL (ref 150–400)
RBC: 3.78 MIL/uL — ABNORMAL LOW (ref 3.87–5.11)
RDW: 16.1 % — ABNORMAL HIGH (ref 11.5–15.5)
WBC Count: 11.1 10*3/uL — ABNORMAL HIGH (ref 4.0–10.5)
nRBC: 0 % (ref 0.0–0.2)

## 2023-01-16 LAB — CMP (CANCER CENTER ONLY)
ALT: 20 U/L (ref 0–44)
AST: 22 U/L (ref 15–41)
Albumin: 3.5 g/dL (ref 3.5–5.0)
Alkaline Phosphatase: 114 U/L (ref 38–126)
Anion gap: 8 (ref 5–15)
BUN: 11 mg/dL (ref 6–20)
CO2: 25 mmol/L (ref 22–32)
Calcium: 8.6 mg/dL — ABNORMAL LOW (ref 8.9–10.3)
Chloride: 104 mmol/L (ref 98–111)
Creatinine: 0.78 mg/dL (ref 0.44–1.00)
GFR, Estimated: >60 mL/min (ref >60)
Glucose, Bld: 128 mg/dL — ABNORMAL HIGH (ref 70–99)
Potassium: 3.6 mmol/L (ref 3.5–5.1)
Sodium: 137 mmol/L (ref 135–145)
Total Bilirubin: 0.3 mg/dL (ref 0.3–1.2)
Total Protein: 7.6 g/dL (ref 6.5–8.1)

## 2023-01-16 LAB — TOTAL PROTEIN, URINE DIPSTICK: Protein, ur: 30 mg/dL — AB

## 2023-01-16 MED ORDER — OXYCODONE-ACETAMINOPHEN 5-325 MG PO TABS: 1.0000 | ORAL_TABLET | Freq: Three times a day (TID) | ORAL | 0 refills | Status: DC | PRN

## 2023-01-16 MED ORDER — SODIUM CHLORIDE 0.9 % IV SOLN: Freq: Once | INTRAVENOUS | Status: AC

## 2023-01-16 MED ORDER — PROCHLORPERAZINE MALEATE 10 MG PO TABS
10.0000 mg | ORAL_TABLET | Freq: Four times a day (QID) | ORAL | Status: DC | PRN
  Administered 2023-01-16: 10 mg via ORAL
  Filled 2023-01-16: qty 1

## 2023-01-16 MED ORDER — SODIUM CHLORIDE 0.9 % IV SOLN
15.0000 mg/kg | Freq: Once | INTRAVENOUS | Status: AC
  Administered 2023-01-16: 800 mg via INTRAVENOUS
  Filled 2023-01-16: qty 32

## 2023-01-16 MED ORDER — SODIUM CHLORIDE 0.9 % IV SOLN
10.0000 mg | Freq: Once | INTRAVENOUS | Status: AC
  Administered 2023-01-16: 10 mg via INTRAVENOUS
  Filled 2023-01-16: qty 10

## 2023-01-16 MED ORDER — SODIUM CHLORIDE 0.9 % IV SOLN
500.0000 mg/m2 | Freq: Once | INTRAVENOUS | Status: AC
  Administered 2023-01-16: 800 mg via INTRAVENOUS
  Filled 2023-01-16: qty 20

## 2023-01-16 NOTE — Patient Instructions (Signed)
[facility]   Discharge Instructions: Thank you for choosing [facility] to provide your oncology and hematology care.   If you have a lab appointment with the [facility], please go directly to the [facility] and check in at the registration area.   Wear comfortable clothing and clothing appropriate for easy access to any Portacath or PICC line.   We strive to give you quality time with your provider. You may need to reschedule your appointment if you arrive late (15 or more minutes).  Arriving late affects you and other patients whose appointments are after yours.  Also, if you miss three or more appointments without notifying the office, you may be dismissed from the clinic at the provider's discretion.      For prescription refill requests, have your pharmacy contact our office and allow 72 hours for refills to be completed.    Today you received the following chemotherapy and/or immunotherapy agents: bevacizumab-awwb and pemetrexed      To help prevent nausea and vomiting after your treatment, we encourage you to take your nausea medication as directed.  BELOW ARE SYMPTOMS THAT SHOULD BE REPORTED IMMEDIATELY: *FEVER GREATER THAN 100.4 F (38 C) OR HIGHER *CHILLS OR SWEATING *NAUSEA AND VOMITING THAT IS NOT CONTROLLED WITH YOUR NAUSEA MEDICATION *UNUSUAL SHORTNESS OF BREATH *UNUSUAL BRUISING OR BLEEDING *URINARY PROBLEMS (pain or burning when urinating, or frequent urination) *BOWEL PROBLEMS (unusual diarrhea, constipation, pain near the anus) TENDERNESS IN MOUTH AND THROAT WITH OR WITHOUT PRESENCE OF ULCERS (sore throat, sores in mouth, or a toothache) UNUSUAL RASH, SWELLING OR PAIN  UNUSUAL VAGINAL DISCHARGE OR ITCHING   Items with * indicate a potential emergency and should be followed up as soon as possible or go to the Emergency Department if any problems should occur.  Please show the CHEMOTHERAPY ALERT CARD or  IMMUNOTHERAPY ALERT CARD at check-in to the Emergency Department and triage nurse.  Should you have questions after your visit or need to cancel or reschedule your appointment, please contact [facility]  Dept: [phone]  and follow the prompts.  Office hours are 8:00 a.m. to 4:30 p.m. Monday - Friday. Please note that voicemails left after 4:00 p.m. may not be returned until the following business day.  We are closed weekends and major holidays. You have access to a nurse at all times for urgent questions. Please call the main number to the clinic Dept: [phone] and follow the prompts.   For any non-urgent questions, you may also contact your provider using MyChart. We now offer e-Visits for anyone 17 and older to request care online for non-urgent symptoms. For details visit [email or web address].   Also download the MyChart app! Go to the app store, search "MyChart", open the app, select [facility], and log in with your MyChart username and password.  Masks are optional in the cancer centers. If you would like for your care team to wear a mask while they are taking care of you, please let them know. You may have one support person who is at least 53 years old accompany you for your appointments.

## 2023-01-17 ENCOUNTER — Other Ambulatory Visit: Payer: Self-pay | Admitting: Physician Assistant

## 2023-01-18 ENCOUNTER — Telehealth: Payer: Self-pay

## 2023-01-18 NOTE — Telephone Encounter (Signed)
This nurse received a message from this patient informing this office that she is scheduled for an extraction on 02/13/2023.  She patient states that she does not need a call back unless she needs to make some changes.  This nurse will forward information to the provider.  No further concerns or questions noted.

## 2023-01-23 ENCOUNTER — Encounter: Payer: Self-pay | Admitting: Internal Medicine

## 2023-01-23 ENCOUNTER — Encounter: Payer: Self-pay | Admitting: Pulmonary Disease

## 2023-01-30 ENCOUNTER — Ambulatory Visit (HOSPITAL_COMMUNITY)
Admission: RE | Admit: 2023-01-30 | Discharge: 2023-01-30 | Disposition: A | Discharge status: Home / Self Care | Payer: Commercial Managed Care - PPO | Source: Ambulatory Visit | Attending: Physician Assistant | Admitting: Physician Assistant

## 2023-01-30 DIAGNOSIS — C3491 Malignant neoplasm of unspecified part of right bronchus or lung: Secondary | ICD-10-CM

## 2023-01-30 MED ORDER — IOHEXOL 300 MG/ML  SOLN
100.0000 mL | Freq: Once | INTRAMUSCULAR | Status: AC | PRN
  Administered 2023-01-30: 100 mL via INTRAVENOUS

## 2023-01-30 MED ORDER — GADOBUTROL 1 MMOL/ML IV SOLN
5.0000 mL | Freq: Once | INTRAVENOUS | Status: AC | PRN
  Administered 2023-01-30: 5 mL via INTRAVENOUS

## 2023-02-05 MED FILL — Dexamethasone Sodium Phosphate Inj 100 MG/10ML: INTRAMUSCULAR | Qty: 1 | Status: AC

## 2023-02-06 ENCOUNTER — Inpatient Hospital Stay: Payer: Commercial Managed Care - PPO | Admitting: Internal Medicine

## 2023-02-06 ENCOUNTER — Ambulatory Visit: Payer: Commercial Managed Care - PPO

## 2023-02-06 ENCOUNTER — Ambulatory Visit: Payer: Commercial Managed Care - PPO | Admitting: Internal Medicine

## 2023-02-06 ENCOUNTER — Inpatient Hospital Stay: Payer: Commercial Managed Care - PPO

## 2023-02-06 ENCOUNTER — Other Ambulatory Visit: Payer: Self-pay

## 2023-02-06 ENCOUNTER — Other Ambulatory Visit: Payer: Commercial Managed Care - PPO

## 2023-02-06 ENCOUNTER — Encounter: Payer: Self-pay | Admitting: Medical Oncology

## 2023-02-06 VITALS — BP 179/96 | HR 94 | Temp 97.7°F | Resp 16

## 2023-02-06 VITALS — BP 169/86 | HR 87 | Temp 98.0°F | Resp 16 | Wt 118.1 lb

## 2023-02-06 DIAGNOSIS — Z5112 Encounter for antineoplastic immunotherapy: Secondary | ICD-10-CM | POA: Diagnosis not present

## 2023-02-06 DIAGNOSIS — C3491 Malignant neoplasm of unspecified part of right bronchus or lung: Secondary | ICD-10-CM | POA: Diagnosis not present

## 2023-02-06 LAB — CMP (CANCER CENTER ONLY)
ALT: 17 U/L (ref 0–44)
AST: 21 U/L (ref 15–41)
Albumin: 3.9 g/dL (ref 3.5–5.0)
Alkaline Phosphatase: 102 U/L (ref 38–126)
Anion gap: 9 (ref 5–15)
BUN: 10 mg/dL (ref 6–20)
CO2: 23 mmol/L (ref 22–32)
Calcium: 9 mg/dL (ref 8.9–10.3)
Chloride: 104 mmol/L (ref 98–111)
Creatinine: 0.78 mg/dL (ref 0.44–1.00)
GFR, Estimated: >60 mL/min (ref >60)
Glucose, Bld: 190 mg/dL — ABNORMAL HIGH (ref 70–99)
Potassium: 3.8 mmol/L (ref 3.5–5.1)
Sodium: 136 mmol/L (ref 135–145)
Total Bilirubin: 0.3 mg/dL (ref 0.3–1.2)
Total Protein: 7.4 g/dL (ref 6.5–8.1)

## 2023-02-06 LAB — CBC WITH DIFFERENTIAL (CANCER CENTER ONLY)
Abs Immature Granulocytes: 0.03 10*3/uL (ref 0.00–0.07)
Basophils Absolute: 0 10*3/uL (ref 0.0–0.1)
Basophils Relative: 0 %
Eosinophils Absolute: 0 10*3/uL (ref 0.0–0.5)
Eosinophils Relative: 0 %
HCT: 40.1 % (ref 36.0–46.0)
Hemoglobin: 13 g/dL (ref 12.0–15.0)
Immature Granulocytes: 0 %
Lymphocytes Relative: 8 %
Lymphs Abs: 0.8 10*3/uL (ref 0.7–4.0)
MCH: 31.4 pg (ref 26.0–34.0)
MCHC: 32.4 g/dL (ref 30.0–36.0)
MCV: 96.9 fL (ref 80.0–100.0)
Monocytes Absolute: 0.2 10*3/uL (ref 0.1–1.0)
Monocytes Relative: 2 %
Neutro Abs: 8.8 10*3/uL — ABNORMAL HIGH (ref 1.7–7.7)
Neutrophils Relative %: 90 %
Platelet Count: 267 10*3/uL (ref 150–400)
RBC: 4.14 MIL/uL (ref 3.87–5.11)
RDW: 16.4 % — ABNORMAL HIGH (ref 11.5–15.5)
WBC Count: 9.8 10*3/uL (ref 4.0–10.5)
nRBC: 0 % (ref 0.0–0.2)

## 2023-02-06 LAB — TOTAL PROTEIN, URINE DIPSTICK: Protein, ur: 30 mg/dL — AB

## 2023-02-06 MED ORDER — SODIUM CHLORIDE 0.9 % IV SOLN
500.0000 mg/m2 | Freq: Once | INTRAVENOUS | Status: AC
  Administered 2023-02-06: 800 mg via INTRAVENOUS
  Filled 2023-02-06: qty 20

## 2023-02-06 MED ORDER — CYANOCOBALAMIN 1000 MCG/ML IJ SOLN
1000.0000 ug | Freq: Once | INTRAMUSCULAR | Status: AC
  Administered 2023-02-06: 1000 ug via INTRAMUSCULAR
  Filled 2023-02-06: qty 1

## 2023-02-06 MED ORDER — SODIUM CHLORIDE 0.9 % IV SOLN
15.0000 mg/kg | Freq: Once | INTRAVENOUS | Status: AC
  Administered 2023-02-06: 800 mg via INTRAVENOUS
  Filled 2023-02-06: qty 32

## 2023-02-06 MED ORDER — PROCHLORPERAZINE MALEATE 10 MG PO TABS
10.0000 mg | ORAL_TABLET | Freq: Once | ORAL | Status: AC
  Administered 2023-02-06: 10 mg via ORAL
  Filled 2023-02-06: qty 1

## 2023-02-06 MED ORDER — SODIUM CHLORIDE 0.9 % IV SOLN: Freq: Once | INTRAVENOUS | Status: AC

## 2023-02-06 MED ORDER — SODIUM CHLORIDE 0.9 % IV SOLN
10.0000 mg | Freq: Once | INTRAVENOUS | Status: AC
  Administered 2023-02-06: 10 mg via INTRAVENOUS
  Filled 2023-02-06: qty 10

## 2023-02-06 NOTE — Patient Instructions (Signed)
[facility]   Discharge Instructions: Thank you for choosing [facility] to provide your oncology and hematology care.   If you have a lab appointment with the [facility], please go directly to the [facility] and check in at the registration area.   Wear comfortable clothing and clothing appropriate for easy access to any Portacath or PICC line.   We strive to give you quality time with your provider. You may need to reschedule your appointment if you arrive late (15 or more minutes).  Arriving late affects you and other patients whose appointments are after yours.  Also, if you miss three or more appointments without notifying the office, you may be dismissed from the clinic at the provider's discretion.      For prescription refill requests, have your pharmacy contact our office and allow 72 hours for refills to be completed.    Today you received the following chemotherapy and/or immunotherapy agents: bevacizumab and pemetrexed      To help prevent nausea and vomiting after your treatment, we encourage you to take your nausea medication as directed.  BELOW ARE SYMPTOMS THAT SHOULD BE REPORTED IMMEDIATELY: *FEVER GREATER THAN 100.4 F (38 C) OR HIGHER *CHILLS OR SWEATING *NAUSEA AND VOMITING THAT IS NOT CONTROLLED WITH YOUR NAUSEA MEDICATION *UNUSUAL SHORTNESS OF BREATH *UNUSUAL BRUISING OR BLEEDING *URINARY PROBLEMS (pain or burning when urinating, or frequent urination) *BOWEL PROBLEMS (unusual diarrhea, constipation, pain near the anus) TENDERNESS IN MOUTH AND THROAT WITH OR WITHOUT PRESENCE OF ULCERS (sore throat, sores in mouth, or a toothache) UNUSUAL RASH, SWELLING OR PAIN  UNUSUAL VAGINAL DISCHARGE OR ITCHING   Items with * indicate a potential emergency and should be followed up as soon as possible or go to the Emergency Department if any problems should occur.  Please show the CHEMOTHERAPY ALERT CARD or IMMUNOTHERAPY  ALERT CARD at check-in to the Emergency Department and triage nurse.  Should you have questions after your visit or need to cancel or reschedule your appointment, please contact [facility]  Dept: [phone]  and follow the prompts.  Office hours are 8:00 a.m. to 4:30 p.m. Monday - Friday. Please note that voicemails left after 4:00 p.m. may not be returned until the following business day.  We are closed weekends and major holidays. You have access to a nurse at all times for urgent questions. Please call the main number to the clinic Dept: [phone] and follow the prompts.   For any non-urgent questions, you may also contact your provider using MyChart. We now offer e-Visits for anyone 46 and older to request care online for non-urgent symptoms. For details visit [email or web address].   Also download the MyChart app! Go to the app store, search "MyChart", open the app, select [facility], and log in with your MyChart username and password.  Masks are optional in the cancer centers. If you would like for your care team to wear a mask while they are taking care of you, please let them know. You may have one support person who is at least 53 years old accompany you for your appointments.

## 2023-02-06 NOTE — Progress Notes (Signed)
[facility] [phone]   [phone]  OFFICE PROGRESS NOTE  Cyndi Bender, PA-C [place] [address]  DIAGNOSIS:  Stage IV (T4, N2, M1 a) non-small cell lung cancer, adenocarcinoma presented with multifocal disease involving the right upper lobe, right lower lobe as well as suspicious lower paratracheal lymphadenopathy and groundglass nodules in the left lung diagnosed in May 2022. The patient had molecular studies performed by [facility] and that showed no detectable mutation but this is likely secondary to low-level circulating free tumor DNA.  PRIOR THERAPY: None.  CURRENT THERAPY: palliative systemic chemotherapy with carboplatin for AUC of 5, Alimta 500 Mg/M2 and Avastin 15 Mg/KG every 3 weeks.  The patient is not a great candidate for immunotherapy because of her history of active systemic lupus erythematosus.  Starting from cycle #7 the patient is on maintenance treatment with Alimta and Avastin every 3 weeks.  She is status post 28 cycles.  INTERVAL HISTORY: Kirsten Williams 52 y.o. female returns to the clinic today for follow-up visit.  The patient is feeling fine today with no concerning complaints except for the occasional headache.  She denied having any chest pain, shortness of breath, cough or hemoptysis.  She has no nausea, vomiting, diarrhea or constipation.  She denied having any fever or chills.  She has no visual changes.  She has no recent weight loss or night sweats.  She has been tolerating her maintenance treatment with Alimta and Avastin fairly well.  The patient is here today for evaluation with repeat CT scan of the chest, abdomen pelvis as well as MRI of the brain before starting cycle #29.   MEDICAL HISTORY: Past Medical History:  Diagnosis Date   Adenocarcinoma, lung, right ([place]) 05/03/2021   Kirsten Williams is a 52 y.o. female with a history of lung nodules dating back to 2019 who is referred in  consultation with Cyndi Bender, PA-C for assessment and management. She is a former smoker having quit in 2016. 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 She missed her screening CT last year and imaging was ordered last month. CT chest from 02-16-2021 reveals pro   Anxiety    GERD (gastroesophageal reflux disease)    SLE (systemic lupus erythematosus) ([place]) 05/03/2021   SLE (systemic lupus erythematosus) ([place]) 05/03/2021    ALLERGIES:  is allergic to clindamycin/lincomycin and carboplatin.  MEDICATIONS:  Current Outpatient Medications  Medication Sig Dispense Refill   B Complex Vitamins (B COMPLEX 50 PO) Take by mouth.     Calcium Carbonate-Vit D-Min (CALCIUM 1200 PO) Take 1 capsule by mouth daily.     cholecalciferol (VITAMIN D3) 25 MCG (1000 UNIT) tablet Take 2,000 Units by mouth daily.     cyclobenzaprine (FLEXERIL) 10 MG tablet Take 10 mg by mouth at bedtime as needed for muscle spasms.     denosumab (PROLIA) 60 MG/ML SOSY injection Inject 60 mg into the skin every 6 (six) months.     dexamethasone (DECADRON) 4 MG tablet 4 mg p.o. twice daily the day before, day of and day after the chemotherapy every 3 weeks. 40 tablet 2   dicyclomine (BENTYL) 10 MG capsule Take 10 mg by mouth every 6 (six) hours as needed.     diphenhydrAMINE (BENADRYL) 25 MG tablet Take 25 mg by mouth daily as needed for allergies.     famotidine (PEPCID) 20 MG tablet Take 20 mg by mouth at bedtime.  folic acid (FOLVITE) 1 MG tablet TAKE 1 TABLET(1 MG) BY MOUTH DAILY 30 tablet 4   gabapentin (NEURONTIN) 300 MG capsule Take 300 mg by mouth 3 (three) times daily.     hydrocortisone 1 % lotion Apply 1 application topically 2 (two) times daily. 118 mL 0   hydroxychloroquine (PLAQUENIL) 200 MG tablet Take 200 mg by mouth 2 (two) times daily.     Multiple Vitamins-Minerals (MULTI ADULT GUMMIES) CHEW Chew 2 tablets by mouth daily.     omeprazole (PRILOSEC) 40 MG capsule Take 40 mg by mouth  daily as needed (acid reflux).     oxyCODONE-acetaminophen (PERCOCET/ROXICET) 5-325 MG tablet Take 1 tablet by mouth every 8 (eight) hours as needed for severe pain. 30 tablet 0   potassium chloride SA (KLOR-CON M) 20 MEQ tablet Take 1 tablet (20 mEq total) by mouth daily. 5 tablet 0   Probiotic Product (PROBIOTIC-10 PO) Take by mouth.     prochlorperazine (COMPAZINE) 10 MG tablet TAKE 1 TABLET(10 MG) BY MOUTH EVERY 6 HOURS AS NEEDED 30 tablet 2   rizatriptan (MAXALT-MLT) 10 MG disintegrating tablet Take 10 mg by mouth 2 (two) times daily as needed.     tetrahydrozoline 0.05 % ophthalmic solution Place 1 drop into both eyes once. Systane Eye Drops once daily both eyes     triamcinolone ointment (KENALOG) 0.5 % Apply topically 3 (three) times daily.     No current facility-administered medications for this visit.    SURGICAL HISTORY:  Past Surgical History:  Procedure Laterality Date   ABDOMINAL HYSTERECTOMY     BRONCHIAL BIOPSY  04/24/2021   Procedure: BRONCHIAL BIOPSIES;  Surgeon: Collene Gobble, MD;  Location: [facility] ENDOSCOPY;  Service: Pulmonary;;   BRONCHIAL BRUSHINGS  04/24/2021   Procedure: BRONCHIAL BRUSHINGS;  Surgeon: Collene Gobble, MD;  Location: [facility] ENDOSCOPY;  Service: Pulmonary;;   BRONCHIAL NEEDLE ASPIRATION BIOPSY  04/24/2021   Procedure: BRONCHIAL NEEDLE ASPIRATION BIOPSIES;  Surgeon: Collene Gobble, MD;  Location: [facility];  Service: Pulmonary;;   BRONCHIAL WASHINGS  04/24/2021   Procedure: BRONCHIAL WASHINGS;  Surgeon: Collene Gobble, MD;  Location: [facility];  Service: Pulmonary;;   CESAREAN SECTION  11/09/1990   DILATION AND CURETTAGE OF UTERUS Bilateral 09/09/1996   FIDUCIAL MARKER PLACEMENT  04/24/2021   Procedure: FIDUCIAL MARKER PLACEMENT;  Surgeon: Collene Gobble, MD;  Location: [facility] ENDOSCOPY;  Service: Pulmonary;;   TONSILLECTOMY  12/10/1977   VIDEO BRONCHOSCOPY WITH ENDOBRONCHIAL NAVIGATION Bilateral 04/24/2021   Procedure: VIDEO BRONCHOSCOPY WITH  ENDOBRONCHIAL NAVIGATION;  Surgeon: Collene Gobble, MD;  Location: [facility] ENDOSCOPY;  Service: Pulmonary;  Laterality: Bilateral;    REVIEW OF SYSTEMS:  Constitutional: negative Eyes: negative Ears, nose, mouth, throat, and face: negative Respiratory: negative Cardiovascular: negative Gastrointestinal: negative Genitourinary:negative Integument/breast: negative Hematologic/lymphatic: negative Musculoskeletal:negative Neurological: positive for headaches Behavioral/Psych: negative Endocrine: negative Allergic/Immunologic: negative   PHYSICAL EXAMINATION: General appearance: alert, cooperative, and no distress Head: Normocephalic, without obvious abnormality, atraumatic Neck: no adenopathy, no JVD, supple, symmetrical, trachea midline, and thyroid not enlarged, symmetric, no tenderness/mass/nodules Lymph nodes: Cervical, supraclavicular, and axillary nodes normal. Resp: clear to auscultation bilaterally Back: symmetric, no curvature. ROM normal. No CVA tenderness. Cardio: regular rate and rhythm, S1, S2 normal, no murmur, click, rub or gallop GI: soft, non-tender; bowel sounds normal; no masses,  no organomegaly Extremities: extremities normal, atraumatic, no cyanosis or edema Neurologic: Alert and oriented X 3, normal strength and tone. Normal symmetric reflexes. Normal coordination and gait  ECOG PERFORMANCE STATUS: 1 -  Symptomatic but completely ambulatory  Blood pressure (!) 169/86, pulse 87, temperature 98 F (36.7 C), temperature source Oral, resp. rate 16, weight 118 lb 2 oz (53.6 kg), SpO2 100 %.  LABORATORY DATA: Lab Results  Component Value Date   WBC 11.1 (H) 01/16/2023   HGB 11.9 (L) 01/16/2023   HCT 37.5 01/16/2023   MCV 99.2 01/16/2023   PLT 321 01/16/2023      Chemistry      Component Value Date/Time   NA 137 01/16/2023 0731   NA 140 03/13/2021 0000   K 3.6 01/16/2023 0731   CL 104 01/16/2023 0731   CO2 25 01/16/2023 0731   BUN 11 01/16/2023 0731   BUN  10 03/13/2021 0000   CREATININE 0.78 01/16/2023 0731   GLU 107 03/13/2021 0000      Component Value Date/Time   CALCIUM 8.6 (L) 01/16/2023 0731   ALKPHOS 114 01/16/2023 0731   AST 22 01/16/2023 0731   ALT 20 01/16/2023 0731   BILITOT 0.3 01/16/2023 0731       RADIOGRAPHIC STUDIES: MR Brain W Wo Contrast  Result Date: 02/02/2023 CLINICAL DATA:  Headaches.  History of metastatic lung cancer. EXAM: MRI HEAD WITHOUT AND WITH CONTRAST TECHNIQUE: Multiplanar, multiecho pulse sequences of the brain and surrounding structures were obtained without and with intravenous contrast. CONTRAST:  32m GADAVIST GADOBUTROL 1 MMOL/ML IV SOLN COMPARISON:  Head MRI 05/19/2021 FINDINGS: Brain: There is no evidence of an acute infarct, intracranial hemorrhage, intra-axial mass, midline shift, or extra-axial fluid collection. The ventricles and sulci are normal. A few small foci of T2 hyperintensity in the cerebral white matter and pons are nonspecific but may reflect minimal chronic small vessel ischemic disease. A 3 mm nodular focus of dural enhancement over the left temporal convexity is unchanged and without associated mass effect or edema (series 16, image 72). No other abnormal intracranial enhancement is identified. Vascular: Major intracranial vascular flow voids are preserved. Skull and upper cervical spine: Unremarkable bone marrow signal. Sinuses/Orbits: Unremarkable orbits. Minimal mucosal thickening in the paranasal sinuses. Trace right mastoid fluid. Other: None. IMPRESSION: 1. No evidence of intracranial metastases or acute intracranial abnormality. 2. Unchanged 3 mm left temporal meningioma. Electronically Signed   By: ALogan BoresM.D.   On: 02/02/2023 11:39   CT Chest W Contrast  Result Date: 01/30/2023 CLINICAL DATA:  Metastatic lung cancer restaging * Tracking Code: BO * EXAM: CT CHEST, ABDOMEN, AND PELVIS WITH CONTRAST TECHNIQUE: Multidetector CT imaging of the chest, abdomen and pelvis was  performed following the standard protocol during bolus administration of intravenous contrast. RADIATION DOSE REDUCTION: This exam was performed according to the departmental dose-optimization program which includes automated exposure control, adjustment of the mA and/or kV according to patient size and/or use of iterative reconstruction technique. CONTRAST:  1016mOMNIPAQUE IOHEXOL 300 MG/ML  SOLN COMPARISON:  10/25/2022, 08/20/2022 FINDINGS: CT CHEST FINDINGS Cardiovascular: Aortic atherosclerosis. Normal heart size. No pericardial effusion. Mediastinum/Nodes: Unchanged prominent pretracheal lymph node measuring 1.5 x 1.0 cm (series 2, image 22). Thyroid gland, trachea, and esophagus demonstrate no significant findings. Lungs/Pleura: Unchanged bilateral pulmonary nodules. Subsolid mixed solid and cystic nodule of the superior segment right lower lobe measures 1.8 x 1.4 cm (series 6, image 62). Unchanged irregular subsolid nodule of the posterior right upper lobe measuring 1.1 x 0.9 cm (series 6, image 34). Somewhat amorphous ground-glass nodule of the peripheral left upper lobe measuring 2.1 x 1.6 cm (series 6, image 39). Occasional small solid pulmonary nodules, for example  a 0.4 cm nodule of the peripheral right upper lobe (series 6, image 55). Mild paraseptal emphysema and diffuse bilateral bronchial wall thickening. No pleural effusion or pneumothorax. Musculoskeletal: No chest wall abnormality. No acute osseous findings. CT ABDOMEN PELVIS FINDINGS Hepatobiliary: No solid liver abnormality is seen. No gallstones, gallbladder wall thickening, or biliary dilatation. Pancreas: Unremarkable. No pancreatic ductal dilatation or surrounding inflammatory changes. Spleen: Normal in size without significant abnormality. Adrenals/Urinary Tract: Unchanged, benign bilateral adrenal adenomata, for which no further follow-up or characterization is required. Kidneys are normal, without renal calculi, solid lesion, or  hydronephrosis. Bladder is unremarkable. Stomach/Bowel: Stomach is within normal limits. Appendix appears normal. No evidence of bowel wall thickening, distention, or inflammatory changes. Vascular/Lymphatic: Aortic atherosclerosis. No enlarged abdominal or pelvic lymph nodes. Reproductive: No mass or other abnormality. Other: No abdominal wall hernia or abnormality. No ascites. Musculoskeletal: No acute osseous findings. IMPRESSION: 1. Unchanged bilateral pulmonary nodules of varying subsolid and solid composition as detailed above. No new nodules. 2. Unchanged prominent pretracheal lymph node. No new lymphadenopathy. 3. No evidence of metastatic disease in the abdomen or pelvis. 4. Mild emphysema and diffuse bilateral bronchial wall thickening. 5. Unchanged, benign bilateral adrenal adenomata, for which no specific further follow-up or characterization is required. Aortic Atherosclerosis (ICD10-I70.0) and Emphysema (ICD10-J43.9). Electronically Signed   By: Delanna Ahmadi M.D.   On: 01/30/2023 17:29   CT Abdomen Pelvis W Contrast  Result Date: 01/30/2023 CLINICAL DATA:  Metastatic lung cancer restaging * Tracking Code: BO * EXAM: CT CHEST, ABDOMEN, AND PELVIS WITH CONTRAST TECHNIQUE: Multidetector CT imaging of the chest, abdomen and pelvis was performed following the standard protocol during bolus administration of intravenous contrast. RADIATION DOSE REDUCTION: This exam was performed according to the departmental dose-optimization program which includes automated exposure control, adjustment of the mA and/or kV according to patient size and/or use of iterative reconstruction technique. CONTRAST:  147m OMNIPAQUE IOHEXOL 300 MG/ML  SOLN COMPARISON:  10/25/2022, 08/20/2022 FINDINGS: CT CHEST FINDINGS Cardiovascular: Aortic atherosclerosis. Normal heart size. No pericardial effusion. Mediastinum/Nodes: Unchanged prominent pretracheal lymph node measuring 1.5 x 1.0 cm (series 2, image 22). Thyroid gland,  trachea, and esophagus demonstrate no significant findings. Lungs/Pleura: Unchanged bilateral pulmonary nodules. Subsolid mixed solid and cystic nodule of the superior segment right lower lobe measures 1.8 x 1.4 cm (series 6, image 62). Unchanged irregular subsolid nodule of the posterior right upper lobe measuring 1.1 x 0.9 cm (series 6, image 34). Somewhat amorphous ground-glass nodule of the peripheral left upper lobe measuring 2.1 x 1.6 cm (series 6, image 39). Occasional small solid pulmonary nodules, for example a 0.4 cm nodule of the peripheral right upper lobe (series 6, image 55). Mild paraseptal emphysema and diffuse bilateral bronchial wall thickening. No pleural effusion or pneumothorax. Musculoskeletal: No chest wall abnormality. No acute osseous findings. CT ABDOMEN PELVIS FINDINGS Hepatobiliary: No solid liver abnormality is seen. No gallstones, gallbladder wall thickening, or biliary dilatation. Pancreas: Unremarkable. No pancreatic ductal dilatation or surrounding inflammatory changes. Spleen: Normal in size without significant abnormality. Adrenals/Urinary Tract: Unchanged, benign bilateral adrenal adenomata, for which no further follow-up or characterization is required. Kidneys are normal, without renal calculi, solid lesion, or hydronephrosis. Bladder is unremarkable. Stomach/Bowel: Stomach is within normal limits. Appendix appears normal. No evidence of bowel wall thickening, distention, or inflammatory changes. Vascular/Lymphatic: Aortic atherosclerosis. No enlarged abdominal or pelvic lymph nodes. Reproductive: No mass or other abnormality. Other: No abdominal wall hernia or abnormality. No ascites. Musculoskeletal: No acute osseous findings. IMPRESSION: 1. Unchanged bilateral  pulmonary nodules of varying subsolid and solid composition as detailed above. No new nodules. 2. Unchanged prominent pretracheal lymph node. No new lymphadenopathy. 3. No evidence of metastatic disease in the abdomen  or pelvis. 4. Mild emphysema and diffuse bilateral bronchial wall thickening. 5. Unchanged, benign bilateral adrenal adenomata, for which no specific further follow-up or characterization is required. Aortic Atherosclerosis (ICD10-I70.0) and Emphysema (ICD10-J43.9). Electronically Signed   By: Delanna Ahmadi M.D.   On: 01/30/2023 17:29    ASSESSMENT AND PLAN:  This is a very pleasant 52 years old white female recently diagnosed with a stage IV non-small cell lung cancer, adenocarcinoma with no actionable mutation diagnosed in May 2022.  The patient has also history of systemic lupus erythematosus and she is not a candidate for immunotherapy. She is currently undergoing systemic chemotherapy with carboplatin for AUC of 5, Alimta 500 Mg/M2 and Avastin 15 Mg/KG every 3 weeks status post 28 cycles.  Starting from cycle #7 the patient is on maintenance treatment with Alimta and Avastin every 3 weeks. The patient has been tolerating this treatment well with no concerning adverse effects. She had repeat CT scan of the chest, abdomen and pelvis performed recently in addition to MRI of the brain. I personally and independently reviewed the scans and discussed the result with the patient today. Her scan showed no concerning findings for disease recurrence or metastasis. I recommended for her to continue on her current treatment with maintenance Alimta and Avastin. She will come back for follow-up visit in 3 weeks for evaluation before starting the next cycle of her treatment. For the hypertension she was advised to monitor her blood pressure closely at home and to discuss with her primary care physician for adjustment of her medication. The patient was advised to call immediately if she has any concerning symptoms in the interval. The patient voices understanding of current disease status and treatment options and is in agreement with the current care plan.  All questions were answered. The patient knows to call  the clinic with any problems, questions or concerns. We can certainly see the patient much sooner if necessary. The total time spent in the appointment was 30 minutes.  Disclaimer: This note was dictated with voice recognition software. Similar sounding words can inadvertently be transcribed and may not be corrected upon review.

## 2023-02-06 NOTE — Progress Notes (Signed)
Per Dr. Julien Nordmann- ok to treat with elevated blood pressure today. Also, patient asked about tooth extraction on March 6th and MD stated it was ok as long as it was at least a week separated from the avastin.

## 2023-02-06 NOTE — Progress Notes (Addendum)
Patient seen by MD today  Vitals within treatment Parameters except BP systolic is not within treatment parameters. Per Dr. Julien Nordmann, it is ok to treat pt today with Avastin  and BP of 173/97.   Labs reviewed: and are within treatment parameters.   Per physician team, patient is ready for treatment and there are NO modifications to the treatment plan.

## 2023-02-23 NOTE — Progress Notes (Unsigned)
Kirst[facility] OFFICE PROGRESS NOTE  Cyndi Bender, PA-C [place] [address]  DIAGNOSIS: Stage IV (T4, N2, M1 a) non-small cell lung cancer, adenocarcinoma presented with multifocal disease involving the right upper lobe, right lower lobe as well as suspicious lower paratracheal lymphadenopathy and groundglass nodules in the left lung diagnosed in May 2022. The patient had molecular studies performed by [facility] and that showed no detectable mutation but this is likely secondary to low-level circulating free tumor DNA.    PRIOR THERAPY: None  CURRENT THERAPY:  Palliative systemic chemotherapy with carboplatin for AUC of 5, Alimta 500 Mg/M2 and Avastin 15 Mg/KG every 3 weeks.  The patient is not a great candidate for immunotherapy in the event that she has a genetic mutation.  She is status post 29 cycles. Starting from cycle #6, the patient will start maintenance Alimta and Avastin.    INTERVAL HISTORY: Kirsten Williams 52 y.o. female returnsreturns to the clinic today for a follow-up visit. The patient is feeling fairly well today without any concerning complaints. She had a tooth extraction on 02/13/23. She is tolerating her treatment fairly well except she does sometimes have fatigue the week following treatment.  Patient often gets diaphragm tightness the week following treatment, although no clear etiology has been seen on several CT scans.  She denies any fever or chills.  She has occasional night sweats which is not new for her she has these intermittently.  She reports a fine appetite. She denies any significant shortness of breath unless with strenuous activity. Denies chest pain. She reports she sometimes a mild cough but nothing new.  Denies any changes with her bowel habits. She sometimes develops a rash for which her dermatologist gave her cream which controlling her rash. She is prone to migraines. She had a restaging brain MRI last month to evaluate her  headaches which was negative for metastatic disease to the brain. She is here for evaluation and repeat blood work before starting cycle #30   MEDICAL HISTORY: Past Medical History:  Diagnosis Date   Adenocarcinoma, lung, right ([place]) 05/03/2021   Kirsten Williams is a 51 y.o. female with a history of lung nodules dating back to 2019 who is referred in consultation with Cyndi Bender, PA-C for assessment and management. She is a former smoker having quit in 2016. 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 She missed her screening CT last year and imaging was ordered last month. CT chest from 02-16-2021 reveals pro   Anxiety    GERD (gastroesophageal reflux disease)    SLE (systemic lupus erythematosus) ([place]) 05/03/2021   SLE (systemic lupus erythematosus) ([place]) 05/03/2021    ALLERGIES:  is allergic to clindamycin/lincomycin and carboplatin.  MEDICATIONS:  Current Outpatient Medications  Medication Sig Dispense Refill   B Complex Vitamins (B COMPLEX 50 PO) Take by mouth.     Calcium Carbonate-Vit D-Min (CALCIUM 1200 PO) Take 1 capsule by mouth daily.     cholecalciferol (VITAMIN D3) 25 MCG (1000 UNIT) tablet Take 2,000 Units by mouth daily.     cyclobenzaprine (FLEXERIL) 10 MG tablet Take 10 mg by mouth at bedtime as needed for muscle spasms.     denosumab (PROLIA) 60 MG/ML SOSY injection Inject 60 mg into the skin every 6 (six) months.     dexamethasone (DECADRON) 4 MG tablet 4 mg p.o. twice daily the day before, day of and day after the chemotherapy every 3 weeks.  40 tablet 2   dicyclomine (BENTYL) 10 MG capsule Take 10 mg by mouth every 6 (six) hours as needed.     diphenhydrAMINE (BENADRYL) 25 MG tablet Take 25 mg by mouth daily as needed for allergies.     famotidine (PEPCID) 20 MG tablet Take 20 mg by mouth at bedtime.     folic acid (FOLVITE) 1 MG tablet TAKE 1 TABLET(1 MG) BY MOUTH DAILY 30 tablet 4   gabapentin (NEURONTIN) 300 MG capsule Take 300 mg by mouth  3 (three) times daily.     hydrocortisone 1 % lotion Apply 1 application topically 2 (two) times daily. 118 mL 0   hydroxychloroquine (PLAQUENIL) 200 MG tablet Take 200 mg by mouth 2 (two) times daily.     Multiple Vitamins-Minerals (MULTI ADULT GUMMIES) CHEW Chew 2 tablets by mouth daily.     omeprazole (PRILOSEC) 40 MG capsule Take 40 mg by mouth daily as needed (acid reflux).     oxyCODONE-acetaminophen (PERCOCET/ROXICET) 5-325 MG tablet Take 1 tablet by mouth every 8 (eight) hours as needed for severe pain. 30 tablet 0   potassium chloride SA (KLOR-CON M) 20 MEQ tablet Take 1 tablet (20 mEq total) by mouth daily. 5 tablet 0   Probiotic Product (PROBIOTIC-10 PO) Take by mouth.     prochlorperazine (COMPAZINE) 10 MG tablet TAKE 1 TABLET(10 MG) BY MOUTH EVERY 6 HOURS AS NEEDED 30 tablet 2   rizatriptan (MAXALT-MLT) 10 MG disintegrating tablet Take 10 mg by mouth 2 (two) times daily as needed.     tetrahydrozoline 0.05 % ophthalmic solution Place 1 drop into both eyes once. Systane Eye Drops once daily both eyes     triamcinolone ointment (KENALOG) 0.5 % Apply topically 3 (three) times daily.     No current facility-administered medications for this visit.    SURGICAL HISTORY:  Past Surgical History:  Procedure Laterality Date   ABDOMINAL HYSTERECTOMY     BRONCHIAL BIOPSY  04/24/2021   Procedure: BRONCHIAL BIOPSIES;  Surgeon: Collene Gobble, MD;  Location: [facility] ENDOSCOPY;  Service: Pulmonary;;   BRONCHIAL BRUSHINGS  04/24/2021   Procedure: BRONCHIAL BRUSHINGS;  Surgeon: Collene Gobble, MD;  Location: [facility] ENDOSCOPY;  Service: Pulmonary;;   BRONCHIAL NEEDLE ASPIRATION BIOPSY  04/24/2021   Procedure: BRONCHIAL NEEDLE ASPIRATION BIOPSIES;  Surgeon: Collene Gobble, MD;  Location: [place];  Service: Pulmonary;;   BRONCHIAL WASHINGS  04/24/2021   Procedure: BRONCHIAL WASHINGS;  Surgeon: Collene Gobble, MD;  Location: [place];  Service: Pulmonary;;   CESAREAN SECTION  11/09/1990    DILATION AND CURETTAGE OF UTERUS Bilateral 09/09/1996   FIDUCIAL MARKER PLACEMENT  04/24/2021   Procedure: FIDUCIAL MARKER PLACEMENT;  Surgeon: Collene Gobble, MD;  Location[facility] ENDOSCOPY;  Service: Pulmonary;;   TONSILLECTOMY  12/10/1977   VIDEO BRONCHOSCOPY WITH ENDOBRONCHIAL NAVIGATION Bilateral 04/24/2021   Procedure: VIDEO BRONCHOSCOPY WITH ENDOBRONCHIAL NAVIGATION;  Surgeon: Collene Gobble, MD;  Location: [facility] ENDOSCOPY;  Service: Pulmonary;  Laterality: Bilateral;    REVIEW OF SYSTEMS:   Review of Systems  Constitutional: Negative for appetite change, chills, fatigue, fever and unexpected weight change.  HENT:   Negative for mouth sores, nosebleeds, sore throat and trouble swallowing.   Eyes: Negative for eye problems and icterus.  Respiratory: Negative for cough, hemoptysis, shortness of breath and wheezing.   Cardiovascular: Negative for chest pain and leg swelling.  Gastrointestinal: Negative for abdominal pain, constipation, diarrhea, nausea and vomiting.  Genitourinary: Negative for bladder incontinence, difficulty urinating, dysuria, frequency and  hematuria.   Musculoskeletal: Negative for back pain, gait problem, neck pain and neck stiffness.  Skin: Negative for itching and rash.  Neurological: Negative for dizziness, extremity weakness, gait problem, headaches, light-headedness and seizures.  Hematological: Negative for adenopathy. Does not bruise/bleed easily.  Psychiatric/Behavioral: Negative for confusion, depression and sleep disturbance. The patient is not nervous/anxious.     PHYSICAL EXAMINATION:  There were no vitals taken for this visit.  ECOG PERFORMANCE STATUS: {CHL ONC ECOG [id number]  Physical Exam  Constitutional: Oriented to person, place, and time and well-developed, well-nourished, and in no distress. No distress.  HENT:  Head: Normocephalic and atraumatic.  Mouth/Throat: Oropharynx is clear and moist. No oropharyngeal exudate.  Eyes: Conjunctivae  are normal. Right eye exhibits no discharge. Left eye exhibits no discharge. No scleral icterus.  Neck: Normal range of motion. Neck supple.  Cardiovascular: Normal rate, regular rhythm, normal heart sounds and intact distal pulses.   Pulmonary/Chest: Effort normal and breath sounds normal. No respiratory distress. No wheezes. No rales.  Abdominal: Soft. Bowel sounds are normal. Exhibits no distension and no mass. There is no tenderness.  Musculoskeletal: Normal range of motion. Exhibits no edema.  Lymphadenopathy:    No cervical adenopathy.  Neurological: Alert and oriented to person, place, and time. Exhibits normal muscle tone. Gait normal. Coordination normal.  Skin: Skin is warm and dry. No rash noted. Not diaphoretic. No erythema. No pallor.  Psychiatric: Mood, memory and judgment normal.  Vitals reviewed.  LABORATORY DATA: Lab Results  Component Value Date   WBC 9.8 02/06/2023   HGB 13.0 02/06/2023   HCT 40.1 02/06/2023   MCV 96.9 02/06/2023   PLT 267 02/06/2023      Chemistry      Component Value Date/Time   NA 136 02/06/2023 1047   NA 140 03/13/2021 0000   K 3.8 02/06/2023 1047   CL 104 02/06/2023 1047   CO2 23 02/06/2023 1047   BUN 10 02/06/2023 1047   BUN 10 03/13/2021 0000   CREATININE 0.78 02/06/2023 1047   GLU 107 03/13/2021 0000      Component Value Date/Time   CALCIUM 9.0 02/06/2023 1047   ALKPHOS 102 02/06/2023 1047   AST 21 02/06/2023 1047   ALT 17 02/06/2023 1047   BILITOT 0.3 02/06/2023 1047       RADIOGRAPHIC STUDIES:  MR Brain W Wo Contrast  Result Date: 02/02/2023 CLINICAL DATA:  Headaches.  History of metastatic lung cancer. EXAM: MRI HEAD WITHOUT AND WITH CONTRAST TECHNIQUE: Multiplanar, multiecho pulse sequences of the brain and surrounding structures were obtained without and with intravenous contrast. CONTRAST:  9mL GADAVIST GADOBUTROL 1 MMOL/ML IV SOLN COMPARISON:  Head MRI 05/19/2021 FINDINGS: Brain: There is no evidence of an acute  infarct, intracranial hemorrhage, intra-axial mass, midline shift, or extra-axial fluid collection. The ventricles and sulci are normal. A few small foci of T2 hyperintensity in the cerebral white matter and pons are nonspecific but may reflect minimal chronic small vessel ischemic disease. A 3 mm nodular focus of dural enhancement over the left temporal convexity is unchanged and without associated mass effect or edema (series 16, image 72). No other abnormal intracranial enhancement is identified. Vascular: Major intracranial vascular flow voids are preserved. Skull and upper cervical spine: Unremarkable bone marrow signal. Sinuses/Orbits: Unremarkable orbits. Minimal mucosal thickening in the paranasal sinuses. Trace right mastoid fluid. Other: None. IMPRESSION: 1. No evidence of intracranial metastases or acute intracranial abnormality. 2. Unchanged 3 mm left temporal meningioma. Electronically Signed   By:  Logan Bores M.D.   On: 02/02/2023 11:39   CT Chest W Contrast  Result Date: 01/30/2023 CLINICAL DATA:  Metastatic lung cancer restaging * Tracking Code: BO * EXAM: CT CHEST, ABDOMEN, AND PELVIS WITH CONTRAST TECHNIQUE: Multidetector CT imaging of the chest, abdomen and pelvis was performed following the standard protocol during bolus administration of intravenous contrast. RADIATION DOSE REDUCTION: This exam was performed according to the departmental dose-optimization program which includes automated exposure control, adjustment of the mA and/or kV according to patient size and/or use of iterative reconstruction technique. CONTRAST:  157mL OMNIPAQUE IOHEXOL 300 MG/ML  SOLN COMPARISON:  10/25/2022, 08/20/2022 FINDINGS: CT CHEST FINDINGS Cardiovascular: Aortic atherosclerosis. Normal heart size. No pericardial effusion. Mediastinum/Nodes: Unchanged prominent pretracheal lymph node measuring 1.5 x 1.0 cm (series 2, image 22). Thyroid gland, trachea, and esophagus demonstrate no significant findings.  Lungs/Pleura: Unchanged bilateral pulmonary nodules. Subsolid mixed solid and cystic nodule of the superior segment right lower lobe measures 1.8 x 1.4 cm (series 6, image 62). Unchanged irregular subsolid nodule of the posterior right upper lobe measuring 1.1 x 0.9 cm (series 6, image 34). Somewhat amorphous ground-glass nodule of the peripheral left upper lobe measuring 2.1 x 1.6 cm (series 6, image 39). Occasional small solid pulmonary nodules, for example a 0.4 cm nodule of the peripheral right upper lobe (series 6, image 55). Mild paraseptal emphysema and diffuse bilateral bronchial wall thickening. No pleural effusion or pneumothorax. Musculoskeletal: No chest wall abnormality. No acute osseous findings. CT ABDOMEN PELVIS FINDINGS Hepatobiliary: No solid liver abnormality is seen. No gallstones, gallbladder wall thickening, or biliary dilatation. Pancreas: Unremarkable. No pancreatic ductal dilatation or surrounding inflammatory changes. Spleen: Normal in size without significant abnormality. Adrenals/Urinary Tract: Unchanged, benign bilateral adrenal adenomata, for which no further follow-up or characterization is required. Kidneys are normal, without renal calculi, solid lesion, or hydronephrosis. Bladder is unremarkable. Stomach/Bowel: Stomach is within normal limits. Appendix appears normal. No evidence of bowel wall thickening, distention, or inflammatory changes. Vascular/Lymphatic: Aortic atherosclerosis. No enlarged abdominal or pelvic lymph nodes. Reproductive: No mass or other abnormality. Other: No abdominal wall hernia or abnormality. No ascites. Musculoskeletal: No acute osseous findings. IMPRESSION: 1. Unchanged bilateral pulmonary nodules of varying subsolid and solid composition as detailed above. No new nodules. 2. Unchanged prominent pretracheal lymph node. No new lymphadenopathy. 3. No evidence of metastatic disease in the abdomen or pelvis. 4. Mild emphysema and diffuse bilateral bronchial  wall thickening. 5. Unchanged, benign bilateral adrenal adenomata, for which no specific further follow-up or characterization is required. Aortic Atherosclerosis (ICD10-I70.0) and Emphysema (ICD10-J43.9). Electronically Signed   By: Delanna Ahmadi M.D.   On: 01/30/2023 17:29   CT Abdomen Pelvis W Contrast  Result Date: 01/30/2023 CLINICAL DATA:  Metastatic lung cancer restaging * Tracking Code: BO * EXAM: CT CHEST, ABDOMEN, AND PELVIS WITH CONTRAST TECHNIQUE: Multidetector CT imaging of the chest, abdomen and pelvis was performed following the standard protocol during bolus administration of intravenous contrast. RADIATION DOSE REDUCTION: This exam was performed according to the departmental dose-optimization program which includes automated exposure control, adjustment of the mA and/or kV according to patient size and/or use of iterative reconstruction technique. CONTRAST:  160mL OMNIPAQUE IOHEXOL 300 MG/ML  SOLN COMPARISON:  10/25/2022, 08/20/2022 FINDINGS: CT CHEST FINDINGS Cardiovascular: Aortic atherosclerosis. Normal heart size. No pericardial effusion. Mediastinum/Nodes: Unchanged prominent pretracheal lymph node measuring 1.5 x 1.0 cm (series 2, image 22). Thyroid gland, trachea, and esophagus demonstrate no significant findings. Lungs/Pleura: Unchanged bilateral pulmonary nodules. Subsolid mixed solid and cystic  nodule of the superior segment right lower lobe measures 1.8 x 1.4 cm (series 6, image 62). Unchanged irregular subsolid nodule of the posterior right upper lobe measuring 1.1 x 0.9 cm (series 6, image 34). Somewhat amorphous ground-glass nodule of the peripheral left upper lobe measuring 2.1 x 1.6 cm (series 6, image 39). Occasional small solid pulmonary nodules, for example a 0.4 cm nodule of the peripheral right upper lobe (series 6, image 55). Mild paraseptal emphysema and diffuse bilateral bronchial wall thickening. No pleural effusion or pneumothorax. Musculoskeletal: No chest wall  abnormality. No acute osseous findings. CT ABDOMEN PELVIS FINDINGS Hepatobiliary: No solid liver abnormality is seen. No gallstones, gallbladder wall thickening, or biliary dilatation. Pancreas: Unremarkable. No pancreatic ductal dilatation or surrounding inflammatory changes. Spleen: Normal in size without significant abnormality. Adrenals/Urinary Tract: Unchanged, benign bilateral adrenal adenomata, for which no further follow-up or characterization is required. Kidneys are normal, without renal calculi, solid lesion, or hydronephrosis. Bladder is unremarkable. Stomach/Bowel: Stomach is within normal limits. Appendix appears normal. No evidence of bowel wall thickening, distention, or inflammatory changes. Vascular/Lymphatic: Aortic atherosclerosis. No enlarged abdominal or pelvic lymph nodes. Reproductive: No mass or other abnormality. Other: No abdominal wall hernia or abnormality. No ascites. Musculoskeletal: No acute osseous findings. IMPRESSION: 1. Unchanged bilateral pulmonary nodules of varying subsolid and solid composition as detailed above. No new nodules. 2. Unchanged prominent pretracheal lymph node. No new lymphadenopathy. 3. No evidence of metastatic disease in the abdomen or pelvis. 4. Mild emphysema and diffuse bilateral bronchial wall thickening. 5. Unchanged, benign bilateral adrenal adenomata, for which no specific further follow-up or characterization is required. Aortic Atherosclerosis (ICD10-I70.0) and Emphysema (ICD10-J43.9). Electronically Signed   By: Delanna Ahmadi M.D.   On: 01/30/2023 17:29     ASSESSMENT/PLAN:  This is a very pleasant 52 year old Caucasian female diagnosed with stage IV (T4, N2, M1 a) non-small cell lung cancer, adenocarcinoma presented with multifocal disease involving the right upper lobe, right lower lobe as well as suspicious lower paratracheal lymphadenopathy and groundglass nodules in the left lung diagnosed in May 2022. The patient had molecular studies  performed by [facility] and that showed no detectable mutation but this is likely secondary to low-level circulating free tumor DNA. If the patient has disease progression in the future, then we will likely retest her for molecular studies   The patient is currently undergoing systemic chemotherapy with carboplatin for an AUC 5, Alimta 500 mg per metered square, and and Avastin 1500 mg/kg IV every 3 weeks.  She is status post 29 cycles.  Starting from cycle #7, the patient has been on maintenance treatment with Alimta and Avastin.   Labs were reviewed.  Recommend that she proceed with cycle #30 today as scheduled.    We will see her back for follow-up visit in 3 weeks for evaluation and repeat blood work before starting cycle #31.   I have refilled her oxycodone which she takes sparingly for cancer related pain.   The patient was advised to call immediately if she has any concerning symptoms in the interval. The patient voices understanding of current disease status and treatment options and is in agreement with the current care plan. All questions were answered. The patient knows to call the clinic with any problems, questions or concerns. We can certainly see the patient much sooner if necessary      No orders of the defined types were placed in this encounter.    I spent {CHL ONC TIME VISIT - [id number] counseling  the patient face to face. The total time spent in the appointment was {CHL ONC TIME VISIT - [id number].  Huxley Vanwagoner L Melvin Whiteford, PA-C 02/23/23

## 2023-02-26 MED FILL — Dexamethasone Sodium Phosphate Inj 100 MG/10ML: INTRAMUSCULAR | Qty: 1 | Status: AC

## 2023-02-27 ENCOUNTER — Inpatient Hospital Stay (HOSPITAL_BASED_OUTPATIENT_CLINIC_OR_DEPARTMENT_OTHER): Payer: Commercial Managed Care - PPO | Admitting: Physician Assistant

## 2023-02-27 ENCOUNTER — Inpatient Hospital Stay: Payer: Commercial Managed Care - PPO | Attending: Hematology and Oncology

## 2023-02-27 ENCOUNTER — Inpatient Hospital Stay: Payer: Commercial Managed Care - PPO

## 2023-02-27 ENCOUNTER — Other Ambulatory Visit: Payer: Self-pay

## 2023-02-27 VITALS — BP 159/95 | HR 86 | Temp 98.0°F | Resp 16

## 2023-02-27 VITALS — BP 157/90 | HR 87 | Temp 97.9°F | Resp 15 | Ht 61.0 in | Wt 115.6 lb

## 2023-02-27 DIAGNOSIS — Z5111 Encounter for antineoplastic chemotherapy: Secondary | ICD-10-CM | POA: Diagnosis present

## 2023-02-27 DIAGNOSIS — R1110 Vomiting, unspecified: Secondary | ICD-10-CM | POA: Diagnosis not present

## 2023-02-27 DIAGNOSIS — C3491 Malignant neoplasm of unspecified part of right bronchus or lung: Secondary | ICD-10-CM

## 2023-02-27 DIAGNOSIS — C3411 Malignant neoplasm of upper lobe, right bronchus or lung: Secondary | ICD-10-CM | POA: Insufficient documentation

## 2023-02-27 LAB — CBC WITH DIFFERENTIAL (CANCER CENTER ONLY)
Abs Immature Granulocytes: 0.01 10*3/uL (ref 0.00–0.07)
Basophils Absolute: 0 10*3/uL (ref 0.0–0.1)
Basophils Relative: 0 %
Eosinophils Absolute: 0 10*3/uL (ref 0.0–0.5)
Eosinophils Relative: 0 %
HCT: 42.2 % (ref 36.0–46.0)
Hemoglobin: 13.6 g/dL (ref 12.0–15.0)
Immature Granulocytes: 0 %
Lymphocytes Relative: 9 %
Lymphs Abs: 0.6 10*3/uL — ABNORMAL LOW (ref 0.7–4.0)
MCH: 31.2 pg (ref 26.0–34.0)
MCHC: 32.2 g/dL (ref 30.0–36.0)
MCV: 96.8 fL (ref 80.0–100.0)
Monocytes Absolute: 0.5 10*3/uL (ref 0.1–1.0)
Monocytes Relative: 7 %
Neutro Abs: 5.6 10*3/uL (ref 1.7–7.7)
Neutrophils Relative %: 84 %
Platelet Count: 177 10*3/uL (ref 150–400)
RBC: 4.36 MIL/uL (ref 3.87–5.11)
RDW: 15.9 % — ABNORMAL HIGH (ref 11.5–15.5)
WBC Count: 6.7 10*3/uL (ref 4.0–10.5)
nRBC: 0 % (ref 0.0–0.2)

## 2023-02-27 LAB — CMP (CANCER CENTER ONLY)
ALT: 15 U/L (ref 0–44)
AST: 25 U/L (ref 15–41)
Albumin: 4.2 g/dL (ref 3.5–5.0)
Alkaline Phosphatase: 110 U/L (ref 38–126)
Anion gap: 12 (ref 5–15)
BUN: 8 mg/dL (ref 6–20)
CO2: 23 mmol/L (ref 22–32)
Calcium: 9.8 mg/dL (ref 8.9–10.3)
Chloride: 103 mmol/L (ref 98–111)
Creatinine: 0.83 mg/dL (ref 0.44–1.00)
GFR, Estimated: >60 mL/min (ref >60)
Glucose, Bld: 160 mg/dL — ABNORMAL HIGH (ref 70–99)
Potassium: 3.5 mmol/L (ref 3.5–5.1)
Sodium: 138 mmol/L (ref 135–145)
Total Bilirubin: 0.3 mg/dL (ref 0.3–1.2)
Total Protein: 8 g/dL (ref 6.5–8.1)

## 2023-02-27 LAB — TOTAL PROTEIN, URINE DIPSTICK: Protein, ur: 300 mg/dL — AB

## 2023-02-27 MED ORDER — SODIUM CHLORIDE 0.9 % IV SOLN
500.0000 mg/m2 | Freq: Once | INTRAVENOUS | Status: AC
  Administered 2023-02-27: 800 mg via INTRAVENOUS
  Filled 2023-02-27: qty 20

## 2023-02-27 MED ORDER — SODIUM CHLORIDE 0.9 % IV SOLN
10.0000 mg | Freq: Once | INTRAVENOUS | Status: AC
  Administered 2023-02-27: 10 mg via INTRAVENOUS
  Filled 2023-02-27: qty 10

## 2023-02-27 MED ORDER — SODIUM CHLORIDE 0.9 % IV SOLN: Freq: Once | INTRAVENOUS | Status: AC

## 2023-02-27 MED ORDER — PROCHLORPERAZINE MALEATE 10 MG PO TABS
10.0000 mg | ORAL_TABLET | Freq: Four times a day (QID) | ORAL | Status: DC | PRN
  Administered 2023-02-27: 10 mg via ORAL
  Filled 2023-02-27: qty 1

## 2023-02-27 NOTE — Progress Notes (Signed)
Patient urine protein is 300 today. No Mvasi, but ok to give Alimta per Cassie Heilingoetter, PA-C.

## 2023-02-27 NOTE — Patient Instructions (Signed)
[facility]   Discharge Instructions: Thank you for choosing [facility] to provide your oncology and hematology care.   If you have a lab appointment with the [facility], please go directly to the [facility] and check in at the registration area.   Wear comfortable clothing and clothing appropriate for easy access to any Portacath or PICC line.   We strive to give you quality time with your provider. You may need to reschedule your appointment if you arrive late (15 or more minutes).  Arriving late affects you and other patients whose appointments are after yours.  Also, if you miss three or more appointments without notifying the office, you may be dismissed from the clinic at the provider's discretion.      For prescription refill requests, have your pharmacy contact our office and allow 72 hours for refills to be completed.    Today you received the following chemotherapy and/or immunotherapy agents: pemetrexed      To help prevent nausea and vomiting after your treatment, we encourage you to take your nausea medication as directed.  BELOW ARE SYMPTOMS THAT SHOULD BE REPORTED IMMEDIATELY: *FEVER GREATER THAN 100.4 F (38 C) OR HIGHER *CHILLS OR SWEATING *NAUSEA AND VOMITING THAT IS NOT CONTROLLED WITH YOUR NAUSEA MEDICATION *UNUSUAL SHORTNESS OF BREATH *UNUSUAL BRUISING OR BLEEDING *URINARY PROBLEMS (pain or burning when urinating, or frequent urination) *BOWEL PROBLEMS (unusual diarrhea, constipation, pain near the anus) TENDERNESS IN MOUTH AND THROAT WITH OR WITHOUT PRESENCE OF ULCERS (sore throat, sores in mouth, or a toothache) UNUSUAL RASH, SWELLING OR PAIN  UNUSUAL VAGINAL DISCHARGE OR ITCHING   Items with * indicate a potential emergency and should be followed up as soon as possible or go to the Emergency Department if any problems should occur.  Please show the CHEMOTHERAPY ALERT CARD or IMMUNOTHERAPY ALERT CARD at  check-in to the Emergency Department and triage nurse.  Should you have questions after your visit or need to cancel or reschedule your appointment, please contact [facility]  Dept: [phone]  and follow the prompts.  Office hours are 8:00 a.m. to 4:30 p.m. Monday - Friday. Please note that voicemails left after 4:00 p.m. may not be returned until the following business day.  We are closed weekends and major holidays. You have access to a nurse at all times for urgent questions. Please call the main number to the clinic Dept: [phone] and follow the prompts.   For any non-urgent questions, you may also contact your provider using MyChart. We now offer e-Visits for anyone 66 and older to request care online for non-urgent symptoms. For details visit [email or web address].   Also download the MyChart app! Go to the app store, search "MyChart", open the app, select [facility], and log in with your MyChart username and password.  Masks are optional in the cancer centers. If you would like for your care team to wear a mask while they are taking care of you, please let them know. You may have one support person who is at least 53 years old accompany you for your appointments.

## 2023-03-05 ENCOUNTER — Other Ambulatory Visit: Payer: Self-pay | Admitting: Pharmacy Technician

## 2023-03-08 ENCOUNTER — Telehealth: Payer: Self-pay | Admitting: Internal Medicine

## 2023-03-08 NOTE — Telephone Encounter (Signed)
Rescheduled 04/10 to 04/11 appointments due to provider pal, called and left a voicemail.

## 2023-03-20 ENCOUNTER — Ambulatory Visit: Payer: Commercial Managed Care - PPO

## 2023-03-20 ENCOUNTER — Other Ambulatory Visit: Payer: Commercial Managed Care - PPO

## 2023-03-20 ENCOUNTER — Ambulatory Visit: Payer: Commercial Managed Care - PPO | Admitting: Internal Medicine

## 2023-03-20 MED FILL — Dexamethasone Sodium Phosphate Inj 100 MG/10ML: INTRAMUSCULAR | Qty: 1 | Status: AC

## 2023-03-21 ENCOUNTER — Inpatient Hospital Stay: Payer: Commercial Managed Care - PPO

## 2023-03-21 ENCOUNTER — Encounter: Payer: Self-pay | Admitting: Internal Medicine

## 2023-03-21 ENCOUNTER — Encounter: Payer: Self-pay | Admitting: Medical Oncology

## 2023-03-21 ENCOUNTER — Other Ambulatory Visit: Payer: Self-pay

## 2023-03-21 ENCOUNTER — Inpatient Hospital Stay (HOSPITAL_BASED_OUTPATIENT_CLINIC_OR_DEPARTMENT_OTHER): Payer: Commercial Managed Care - PPO | Admitting: Internal Medicine

## 2023-03-21 ENCOUNTER — Inpatient Hospital Stay: Payer: Commercial Managed Care - PPO | Attending: Hematology and Oncology

## 2023-03-21 DIAGNOSIS — C3491 Malignant neoplasm of unspecified part of right bronchus or lung: Secondary | ICD-10-CM

## 2023-03-21 DIAGNOSIS — M329 Systemic lupus erythematosus, unspecified: Secondary | ICD-10-CM | POA: Insufficient documentation

## 2023-03-21 DIAGNOSIS — C3411 Malignant neoplasm of upper lobe, right bronchus or lung: Secondary | ICD-10-CM | POA: Insufficient documentation

## 2023-03-21 DIAGNOSIS — Z5112 Encounter for antineoplastic immunotherapy: Secondary | ICD-10-CM | POA: Insufficient documentation

## 2023-03-21 DIAGNOSIS — Z5111 Encounter for antineoplastic chemotherapy: Secondary | ICD-10-CM | POA: Diagnosis present

## 2023-03-21 LAB — CBC WITH DIFFERENTIAL (CANCER CENTER ONLY)
Abs Immature Granulocytes: 0.05 10*3/uL (ref 0.00–0.07)
Basophils Absolute: 0.1 10*3/uL (ref 0.0–0.1)
Basophils Relative: 1 %
Eosinophils Absolute: 0 10*3/uL (ref 0.0–0.5)
Eosinophils Relative: 0 %
HCT: 39.6 % (ref 36.0–46.0)
Hemoglobin: 12.8 g/dL (ref 12.0–15.0)
Immature Granulocytes: 1 %
Lymphocytes Relative: 7 %
Lymphs Abs: 0.7 10*3/uL (ref 0.7–4.0)
MCH: 31.7 pg (ref 26.0–34.0)
MCHC: 32.3 g/dL (ref 30.0–36.0)
MCV: 98 fL (ref 80.0–100.0)
Monocytes Absolute: 0.2 10*3/uL (ref 0.1–1.0)
Monocytes Relative: 2 %
Neutro Abs: 9 10*3/uL — ABNORMAL HIGH (ref 1.7–7.7)
Neutrophils Relative %: 89 %
Platelet Count: 422 10*3/uL — ABNORMAL HIGH (ref 150–400)
RBC: 4.04 MIL/uL (ref 3.87–5.11)
RDW: 15.6 % — ABNORMAL HIGH (ref 11.5–15.5)
WBC Count: 10 10*3/uL (ref 4.0–10.5)
nRBC: 0 % (ref 0.0–0.2)

## 2023-03-21 LAB — CMP (CANCER CENTER ONLY)
ALT: 38 U/L (ref 0–44)
AST: 31 U/L (ref 15–41)
Albumin: 3.8 g/dL (ref 3.5–5.0)
Alkaline Phosphatase: 178 U/L — ABNORMAL HIGH (ref 38–126)
Anion gap: 8 (ref 5–15)
BUN: 7 mg/dL (ref 6–20)
CO2: 26 mmol/L (ref 22–32)
Calcium: 9.7 mg/dL (ref 8.9–10.3)
Chloride: 105 mmol/L (ref 98–111)
Creatinine: 0.76 mg/dL (ref 0.44–1.00)
GFR, Estimated: >60 mL/min (ref >60)
Glucose, Bld: 181 mg/dL — ABNORMAL HIGH (ref 70–99)
Potassium: 3.9 mmol/L (ref 3.5–5.1)
Sodium: 139 mmol/L (ref 135–145)
Total Bilirubin: 0.3 mg/dL (ref 0.3–1.2)
Total Protein: 8.4 g/dL — ABNORMAL HIGH (ref 6.5–8.1)

## 2023-03-21 LAB — TOTAL PROTEIN, URINE DIPSTICK: Protein, ur: 100 mg/dL — AB

## 2023-03-21 MED ORDER — SODIUM CHLORIDE 0.9 % IV SOLN
15.0000 mg/kg | Freq: Once | INTRAVENOUS | Status: AC
  Administered 2023-03-21: 800 mg via INTRAVENOUS
  Filled 2023-03-21: qty 32

## 2023-03-21 MED ORDER — SODIUM CHLORIDE 0.9 % IV SOLN: Freq: Once | INTRAVENOUS | Status: AC

## 2023-03-21 MED ORDER — PROCHLORPERAZINE MALEATE 10 MG PO TABS
10.0000 mg | ORAL_TABLET | Freq: Once | ORAL | Status: AC
  Administered 2023-03-21: 10 mg via ORAL
  Filled 2023-03-21: qty 1

## 2023-03-21 MED ORDER — SODIUM CHLORIDE 0.9 % IV SOLN
500.0000 mg/m2 | Freq: Once | INTRAVENOUS | Status: AC
  Administered 2023-03-21: 800 mg via INTRAVENOUS
  Filled 2023-03-21: qty 20

## 2023-03-21 MED ORDER — SODIUM CHLORIDE 0.9 % IV SOLN
10.0000 mg | Freq: Once | INTRAVENOUS | Status: AC
  Administered 2023-03-21: 10 mg via INTRAVENOUS
  Filled 2023-03-21: qty 10

## 2023-03-21 NOTE — Patient Instructions (Addendum)
[place] [facility]   Discharge Instructions: Thank you for choosing [facility] to provide your oncology and hematology care.   If you have a lab appointment with the Cancer Center, please go directly to the Cancer Center and check in at the registration area.   Wear comfortable clothing and clothing appropriate for easy access to any Portacath or PICC line.   We strive to give you quality time with your provider. You may need to reschedule your appointment if you arrive late (15 or more minutes).  Arriving late affects you and other patients whose appointments are after yours.  Also, if you miss three or more appointments without notifying the office, you may be dismissed from the clinic at the provider's discretion.      For prescription refill requests, have your pharmacy contact our office and allow 72 hours for refills to be completed.    Today you received the following chemotherapy and/or immunotherapy agents: Bevacizumab and Pemetrexed      To help prevent nausea and vomiting after your treatment, we encourage you to take your nausea medication as directed.  BELOW ARE SYMPTOMS THAT SHOULD BE REPORTED IMMEDIATELY: *FEVER GREATER THAN 100.4 F (38 C) OR HIGHER *CHILLS OR SWEATING *NAUSEA AND VOMITING THAT IS NOT CONTROLLED WITH YOUR NAUSEA MEDICATION *UNUSUAL SHORTNESS OF BREATH *UNUSUAL BRUISING OR BLEEDING *URINARY PROBLEMS (pain or burning when urinating, or frequent urination) *BOWEL PROBLEMS (unusual diarrhea, constipation, pain near the anus) TENDERNESS IN MOUTH AND THROAT WITH OR WITHOUT PRESENCE OF ULCERS (sore throat, sores in mouth, or a toothache) UNUSUAL RASH, SWELLING OR PAIN  UNUSUAL VAGINAL DISCHARGE OR ITCHING   Items with * indicate a potential emergency and should be followed up as soon as possible or go to the Emergency Department if any problems should occur.  Please show the CHEMOTHERAPY ALERT CARD or IMMUNOTHERAPY  ALERT CARD at check-in to the Emergency Department and triage nurse.  Should you have questions after your visit or need to cancel or reschedule your appointment, please contact [place] [facility]  Dept: [phone]  and follow the prompts.  Office hours are 8:00 a.m. to 4:30 p.m. Monday - Friday. Please note that voicemails left after 4:00 p.m. may not be returned until the following business day.  We are closed weekends and major holidays. You have access to a nurse at all times for urgent questions. Please call the main number to the clinic Dept: [phone] and follow the prompts.   For any non-urgent questions, you may also contact your provider using MyChart. We now offer e-Visits for anyone 53 and older to request care online for non-urgent symptoms. For details visit [email or web address].   Also download the MyChart app! Go to the app store, search "MyChart", open the app, select , and log in with your MyChart username and password.  Masks are optional in the cancer centers. If you would like for your care team to wear a mask while they are taking care of you, please let them know. You may have one support person who is at least 53 years old accompany you for your appointments.

## 2023-03-21 NOTE — Progress Notes (Signed)
Patient seen by Dr. Gypsy Balsam are not within treatment parameters. Per Dr Arbutus Ped , it is okay to to treat pt today with bevacizumab-awwb and Pemetrexed & heart rate of 103 .  Labs reviewed: and are not within treatment parameters. Per Dr. Arbutus Ped , It is okay to treat pt today with bevacizumab-awwb and Pemetrexed and urine protein of 100.Marland Kitchen Per physician team, patient is ready for treatment and there are NO modifications to the treatment plan.

## 2023-03-21 NOTE — Progress Notes (Signed)
[facility] Telephone:([phone]) [phone]   Fax:([phone]  OFFICE PROGRESS NOTE  Kirsten Williams, Kirsten, Kirsten Williams [address] [place] [id number]  DIAGNOSIS:  Stage IV (T4, N2, M1 a) non-small cell lung cancer, adenocarcinoma presented with multifocal disease involving the right upper lobe, right lower lobe as well as suspicious lower paratracheal lymphadenopathy and groundglass nodules in the left lung diagnosed in May 2022. The patient had molecular studies performed by [facility] [employer] and that showed no detectable mutation but this is likely secondary to low-level circulating free tumor DNA.  PRIOR THERAPY: None.  CURRENT THERAPY: palliative systemic chemotherapy with carboplatin for AUC of 5, Alimta 500 Mg/M2 and Avastin 15 Mg/KG every 3 weeks.  The patient is not a great candidate for immunotherapy because of her history of active systemic lupus erythematosus.  Starting from cycle #7 the patient is on maintenance treatment with Alimta and Avastin every 3 weeks.  She is status post 30 cycles.  INTERVAL HISTORY: Kirsten Williams 52 y.o. female returns to the clinic today for follow-up visit.  The patient has been complaining of increasing fatigue and weakness.  She was treated recently for suspicious parasitic infection with hookworms, Ancylostoma doudenale.  Her dog had a similar problem.  She was to treat with antihelminthic drug by her primary care physician.  The patient denied having any current chest pain, shortness of breath, cough or hemoptysis.  She has no nausea, vomiting, diarrhea or constipation.  She is here today for evaluation before starting cycle #31.   MEDICAL HISTORY: Past Medical History:  Diagnosis Date   Adenocarcinoma, lung, right (HCC) 05/03/2021   Kirsten Williams is a 52 y.o. female with a history of lung nodules dating back to 2019 who is referred in consultation with Kirsten PeakNathan Williams, Kirsten Williams for assessment and management. She is a former smoker having quit  in 2016. 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 She missed her screening CT last year and imaging was ordered last month. CT chest from 02-16-2021 reveals pro   Anxiety    GERD (gastroesophageal reflux disease)    SLE (systemic lupus erythematosus) (HCC) 05/03/2021   SLE (systemic lupus erythematosus) (HCC) 05/03/2021    ALLERGIES:  is allergic to clindamycin/lincomycin and carboplatin.  MEDICATIONS:  Current Outpatient Medications  Medication Sig Dispense Refill   amLODipine (NORVASC) 5 MG tablet Take 5 mg by mouth daily.     fluticasone (FLONASE) 50 MCG/ACT nasal spray Place 2 sprays into both nostrils daily.     B Complex Vitamins (B COMPLEX 50 PO) Take by mouth.     Calcium Carbonate-Vit D-Min (CALCIUM 1200 PO) Take 1 capsule by mouth daily.     cholecalciferol (VITAMIN D3) 25 MCG (1000 UNIT) tablet Take 2,000 Units by mouth daily.     cyclobenzaprine (FLEXERIL) 10 MG tablet Take 10 mg by mouth at bedtime as needed for muscle spasms.     denosumab (PROLIA) 60 MG/ML SOSY injection Inject 60 mg into the skin every 6 (six) months.     dexamethasone (DECADRON) 4 MG tablet 4 mg p.o. twice daily the day before, day of and day after the chemotherapy every 3 weeks. 40 tablet 2   dicyclomine (BENTYL) 10 MG capsule Take 10 mg by mouth every 6 (six) hours as needed.     diphenhydrAMINE (BENADRYL) 25 MG tablet Take 25 mg by mouth daily as needed for allergies.     famotidine (PEPCID) 20 MG tablet  Take 20 mg by mouth at bedtime.     folic acid (FOLVITE) 1 MG tablet TAKE 1 TABLET(1 MG) BY MOUTH DAILY 30 tablet 4   gabapentin (NEURONTIN) 300 MG capsule Take 300 mg by mouth 3 (three) times daily.     hydrocortisone 1 % lotion Apply 1 application topically 2 (two) times daily. 118 mL 0   hydroxychloroquine (PLAQUENIL) 200 MG tablet Take 200 mg by mouth 2 (two) times daily.     Multiple Vitamins-Minerals (MULTI ADULT GUMMIES) CHEW Chew 2 tablets by mouth daily.      omeprazole (PRILOSEC) 40 MG capsule Take 40 mg by mouth daily as needed (acid reflux).     oxyCODONE-acetaminophen (PERCOCET/ROXICET) 5-325 MG tablet Take 1 tablet by mouth every 8 (eight) hours as needed for severe pain. 30 tablet 0   potassium chloride SA (KLOR-CON M) 20 MEQ tablet Take 1 tablet (20 mEq total) by mouth daily. 5 tablet 0   Probiotic Product (PROBIOTIC-10 PO) Take by mouth.     prochlorperazine (COMPAZINE) 10 MG tablet TAKE 1 TABLET(10 MG) BY MOUTH EVERY 6 HOURS AS NEEDED 30 tablet 2   rizatriptan (MAXALT-MLT) 10 MG disintegrating tablet Take 10 mg by mouth 2 (two) times daily as needed.     tetrahydrozoline 0.05 % ophthalmic solution Place 1 drop into both eyes once. Systane Eye Drops once daily both eyes     triamcinolone ointment (KENALOG) 0.5 % Apply topically 3 (three) times daily.     No current facility-administered medications for this visit.    SURGICAL HISTORY:  Past Surgical History:  Procedure Laterality Date   ABDOMINAL HYSTERECTOMY     BRONCHIAL BIOPSY  04/24/2021   Procedure: BRONCHIAL BIOPSIES;  Surgeon: Kirsten Peer, MD;  Location: [facility] ENDOSCOPY;  Service: Pulmonary;;   BRONCHIAL BRUSHINGS  04/24/2021   Procedure: BRONCHIAL BRUSHINGS;  Surgeon: Kirsten Peer, MD;  Location: [facility] ENDOSCOPY;  Service: Pulmonary;;   BRONCHIAL NEEDLE ASPIRATION BIOPSY  04/24/2021   Procedure: BRONCHIAL NEEDLE ASPIRATION BIOPSIES;  Surgeon: Kirsten Peer, MD;  Location: [facility] ENDOSCOPY;  Service: Pulmonary;;   BRONCHIAL WASHINGS  04/24/2021   Procedure: BRONCHIAL WASHINGS;  Surgeon: Kirsten Peer, MD;  Location: [facility] ENDOSCOPY;  Service: Pulmonary;;   CESAREAN SECTION  11/09/1990   DILATION AND CURETTAGE OF UTERUS Bilateral 09/09/1996   FIDUCIAL MARKER PLACEMENT  04/24/2021   Procedure: FIDUCIAL MARKER PLACEMENT;  Surgeon: Kirsten Peer, MD;  Location: [facility] ENDOSCOPY;  Service: Pulmonary;;   TONSILLECTOMY  12/10/1977   VIDEO BRONCHOSCOPY WITH ENDOBRONCHIAL NAVIGATION Bilateral  04/24/2021   Procedure: VIDEO BRONCHOSCOPY WITH ENDOBRONCHIAL NAVIGATION;  Surgeon: Kirsten Peer, MD;  Location: [facility] ENDOSCOPY;  Service: Pulmonary;  Laterality: Bilateral;    REVIEW OF SYSTEMS:  A comprehensive review of systems was negative except for: Constitutional: positive for fatigue   PHYSICAL EXAMINATION: General appearance: alert, cooperative, and no distress Head: Normocephalic, without obvious abnormality, atraumatic Neck: no adenopathy, no JVD, supple, symmetrical, trachea midline, and thyroid not enlarged, symmetric, no tenderness/mass/nodules Lymph nodes: Cervical, supraclavicular, and axillary nodes normal. Resp: clear to auscultation bilaterally Back: symmetric, no curvature. ROM normal. No CVA tenderness. Cardio: regular rate and rhythm, S1, S2 normal, no murmur, click, rub or gallop GI: soft, non-tender; bowel sounds normal; no masses,  no organomegaly Extremities: extremities normal, atraumatic, no cyanosis or edema  ECOG PERFORMANCE STATUS: 1 - Symptomatic but completely ambulatory  Blood pressure 129/79, pulse (!) 103, temperature 98 F (36.7 C), temperature source Oral, resp. rate 16, weight 115 lb  6.4 oz (52.3 kg), SpO2 98 %.  LABORATORY DATA: Lab Results  Component Value Date   WBC 10.0 03/21/2023   HGB 12.8 03/21/2023   HCT 39.6 03/21/2023   MCV 98.0 03/21/2023   PLT 422 (H) 03/21/2023      Chemistry      Component Value Date/Time   NA 138 02/27/2023 0902   NA 140 03/13/2021 0000   K 3.5 02/27/2023 0902   CL 103 02/27/2023 0902   CO2 23 02/27/2023 0902   BUN 8 02/27/2023 0902   BUN 10 03/13/2021 0000   CREATININE 0.83 02/27/2023 0902   GLU 107 03/13/2021 0000      Component Value Date/Time   CALCIUM 9.8 02/27/2023 0902   ALKPHOS 110 02/27/2023 0902   AST 25 02/27/2023 0902   ALT 15 02/27/2023 0902   BILITOT 0.3 02/27/2023 0902       RADIOGRAPHIC STUDIES: No results found.  ASSESSMENT AND PLAN:  This is a very pleasant 52 years old  white female recently diagnosed with a stage IV non-small cell lung cancer, adenocarcinoma with no actionable mutation diagnosed in May 2022.  The patient has also history of systemic lupus erythematosus and she is not a candidate for immunotherapy. She is currently undergoing systemic chemotherapy with carboplatin for AUC of 5, Alimta 500 Mg/M2 and Avastin 15 Mg/KG every 3 weeks status post 30 cycles.  Starting from cycle #7 the patient is on maintenance treatment with Alimta and Avastin every 3 weeks. The patient has been tolerating this treatment well.  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 I recommended for the patient to proceed with cycle #31 today as planned. I will see her back for follow-up visit in 3 weeks for evaluation before the next cycle of her treatment. The patient was advised to call immediately if she has any concerning symptoms in the interval. The patient voices understanding of current disease status and treatment options and is in agreement with the current care plan.  All questions were answered. The patient knows to call the clinic with any problems, questions or concerns. We can certainly see the patient much sooner if necessary. The total time spent in the appointment was 20 minutes.  Disclaimer: This note was dictated with voice recognition software. Similar sounding words can inadvertently be transcribed and may not be corrected upon review.

## 2023-04-10 ENCOUNTER — Inpatient Hospital Stay: Payer: Commercial Managed Care - PPO | Attending: Hematology and Oncology

## 2023-04-10 ENCOUNTER — Inpatient Hospital Stay: Payer: Commercial Managed Care - PPO

## 2023-04-10 ENCOUNTER — Encounter: Payer: Self-pay | Admitting: Medical Oncology

## 2023-04-10 ENCOUNTER — Telehealth: Payer: Self-pay | Admitting: Internal Medicine

## 2023-04-10 ENCOUNTER — Encounter: Payer: Self-pay | Admitting: Internal Medicine

## 2023-04-10 ENCOUNTER — Inpatient Hospital Stay (HOSPITAL_BASED_OUTPATIENT_CLINIC_OR_DEPARTMENT_OTHER): Payer: Commercial Managed Care - PPO | Admitting: Internal Medicine

## 2023-04-10 ENCOUNTER — Other Ambulatory Visit: Payer: Self-pay

## 2023-04-10 VITALS — BP 152/82 | HR 87 | Temp 97.5°F | Resp 16 | Wt 116.6 lb

## 2023-04-10 VITALS — BP 150/89 | HR 92

## 2023-04-10 DIAGNOSIS — M329 Systemic lupus erythematosus, unspecified: Secondary | ICD-10-CM | POA: Diagnosis not present

## 2023-04-10 DIAGNOSIS — Z5112 Encounter for antineoplastic immunotherapy: Secondary | ICD-10-CM | POA: Insufficient documentation

## 2023-04-10 DIAGNOSIS — Z5111 Encounter for antineoplastic chemotherapy: Secondary | ICD-10-CM | POA: Insufficient documentation

## 2023-04-10 DIAGNOSIS — C3411 Malignant neoplasm of upper lobe, right bronchus or lung: Secondary | ICD-10-CM | POA: Insufficient documentation

## 2023-04-10 DIAGNOSIS — C3491 Malignant neoplasm of unspecified part of right bronchus or lung: Secondary | ICD-10-CM

## 2023-04-10 DIAGNOSIS — C3490 Malignant neoplasm of unspecified part of unspecified bronchus or lung: Secondary | ICD-10-CM | POA: Diagnosis not present

## 2023-04-10 LAB — CMP (CANCER CENTER ONLY)
ALT: 20 U/L (ref 0–44)
AST: 28 U/L (ref 15–41)
Albumin: 4 g/dL (ref 3.5–5.0)
Alkaline Phosphatase: 124 U/L (ref 38–126)
Anion gap: 10 (ref 5–15)
BUN: 8 mg/dL (ref 6–20)
CO2: 26 mmol/L (ref 22–32)
Calcium: 9.8 mg/dL (ref 8.9–10.3)
Chloride: 103 mmol/L (ref 98–111)
Creatinine: 0.77 mg/dL (ref 0.44–1.00)
GFR, Estimated: >60 mL/min (ref >60)
Glucose, Bld: 123 mg/dL — ABNORMAL HIGH (ref 70–99)
Potassium: 3.3 mmol/L — ABNORMAL LOW (ref 3.5–5.1)
Sodium: 139 mmol/L (ref 135–145)
Total Bilirubin: 0.3 mg/dL (ref 0.3–1.2)
Total Protein: 8.2 g/dL — ABNORMAL HIGH (ref 6.5–8.1)

## 2023-04-10 LAB — CBC WITH DIFFERENTIAL (CANCER CENTER ONLY)
Abs Immature Granulocytes: 0.04 10*3/uL (ref 0.00–0.07)
Basophils Absolute: 0.1 10*3/uL (ref 0.0–0.1)
Basophils Relative: 1 %
Eosinophils Absolute: 0.2 10*3/uL (ref 0.0–0.5)
Eosinophils Relative: 2 %
HCT: 40.2 % (ref 36.0–46.0)
Hemoglobin: 12.6 g/dL (ref 12.0–15.0)
Immature Granulocytes: 1 %
Lymphocytes Relative: 15 %
Lymphs Abs: 1.3 10*3/uL (ref 0.7–4.0)
MCH: 31.7 pg (ref 26.0–34.0)
MCHC: 31.3 g/dL (ref 30.0–36.0)
MCV: 101 fL — ABNORMAL HIGH (ref 80.0–100.0)
Monocytes Absolute: 0.4 10*3/uL (ref 0.1–1.0)
Monocytes Relative: 5 %
Neutro Abs: 6.8 10*3/uL (ref 1.7–7.7)
Neutrophils Relative %: 76 %
Platelet Count: 269 10*3/uL (ref 150–400)
RBC: 3.98 MIL/uL (ref 3.87–5.11)
RDW: 16.6 % — ABNORMAL HIGH (ref 11.5–15.5)
WBC Count: 8.8 10*3/uL (ref 4.0–10.5)
nRBC: 0 % (ref 0.0–0.2)

## 2023-04-10 LAB — TOTAL PROTEIN, URINE DIPSTICK: Protein, ur: 30 mg/dL — AB

## 2023-04-10 MED ORDER — PROCHLORPERAZINE MALEATE 10 MG PO TABS
10.0000 mg | ORAL_TABLET | Freq: Once | ORAL | Status: AC
  Administered 2023-04-10: 10 mg via ORAL
  Filled 2023-04-10: qty 1

## 2023-04-10 MED ORDER — SODIUM CHLORIDE 0.9 % IV SOLN: Freq: Once | INTRAVENOUS | Status: AC

## 2023-04-10 MED ORDER — SODIUM CHLORIDE 0.9 % IV SOLN
10.0000 mg | Freq: Once | INTRAVENOUS | Status: AC
  Administered 2023-04-10: 10 mg via INTRAVENOUS
  Filled 2023-04-10: qty 10

## 2023-04-10 MED ORDER — SODIUM CHLORIDE 0.9 % IV SOLN
500.0000 mg/m2 | Freq: Once | INTRAVENOUS | Status: AC
  Administered 2023-04-10: 800 mg via INTRAVENOUS
  Filled 2023-04-10: qty 20

## 2023-04-10 MED ORDER — SODIUM CHLORIDE 0.9 % IV SOLN
15.0000 mg/kg | Freq: Once | INTRAVENOUS | Status: AC
  Administered 2023-04-10: 800 mg via INTRAVENOUS
  Filled 2023-04-10: qty 32

## 2023-04-10 MED ORDER — CYANOCOBALAMIN 1000 MCG/ML IJ SOLN
1000.0000 ug | Freq: Once | INTRAMUSCULAR | Status: AC
  Administered 2023-04-10: 1000 ug via INTRAMUSCULAR
  Filled 2023-04-10: qty 1

## 2023-04-10 NOTE — Progress Notes (Signed)
Patient seen by Dr. Gypsy Balsam are within treatment parameters.  Labs reviewed: and are not all within treatment parameters. Per Dr Arbutus Ped ,it is okay to treat pt today with Bevacizumab-awwb and urine protein of 30.  Per physician team, patient is ready for treatment and there are NO modifications to the treatment plan.   cycle 32 -bevacizumab-awwb and Pemetrexed

## 2023-04-10 NOTE — Patient Instructions (Signed)
[place] [facility]  Discharge Instructions: Thank you for choosing [facility] to provide your oncology and hematology care.   If you have a lab appointment with the Cancer Center, please go directly to the Cancer Center and check in at the registration area.   Wear comfortable clothing and clothing appropriate for easy access to any Portacath or PICC line.   We strive to give you quality time with your provider. You may need to reschedule your appointment if you arrive late (15 or more minutes).  Arriving late affects you and other patients whose appointments are after yours.  Also, if you miss three or more appointments without notifying the office, you may be dismissed from the clinic at the provider's discretion.      For prescription refill requests, have your pharmacy contact our office and allow 72 hours for refills to be completed.    Today you received the following chemotherapy and/or immunotherapy agents: Mvasi, Alimta      To help prevent nausea and vomiting after your treatment, we encourage you to take your nausea medication as directed.  BELOW ARE SYMPTOMS THAT SHOULD BE REPORTED IMMEDIATELY: *FEVER GREATER THAN 100.4 F (38 C) OR HIGHER *CHILLS OR SWEATING *NAUSEA AND VOMITING THAT IS NOT CONTROLLED WITH YOUR NAUSEA MEDICATION *UNUSUAL SHORTNESS OF BREATH *UNUSUAL BRUISING OR BLEEDING *URINARY PROBLEMS (pain or burning when urinating, or frequent urination) *BOWEL PROBLEMS (unusual diarrhea, constipation, pain near the anus) TENDERNESS IN MOUTH AND THROAT WITH OR WITHOUT PRESENCE OF ULCERS (sore throat, sores in mouth, or a toothache) UNUSUAL RASH, SWELLING OR PAIN  UNUSUAL VAGINAL DISCHARGE OR ITCHING   Items with * indicate a potential emergency and should be followed up as soon as possible or go to the Emergency Department if any problems should occur.  Please show the CHEMOTHERAPY ALERT CARD or IMMUNOTHERAPY ALERT CARD at  check-in to the Emergency Department and triage nurse.  Should you have questions after your visit or need to cancel or reschedule your appointment, please contact [place] [facility]  Dept: [phone]  and follow the prompts.  Office hours are 8:00 a.m. to 4:30 p.m. Monday - Friday. Please note that voicemails left after 4:00 p.m. may not be returned until the following business day.  We are closed weekends and major holidays. You have access to a nurse at all times for urgent questions. Please call the main number to the clinic Dept: [phone] and follow the prompts.   For any non-urgent questions, you may also contact your provider using MyChart. We now offer e-Visits for anyone 29 and older to request care online for non-urgent symptoms. For details visit [email or web address].   Also download the MyChart app! Go to the app store, search "MyChart", open the app, select [place], and log in with your MyChart username and password.

## 2023-04-10 NOTE — Progress Notes (Signed)
[facility] [phone]   [phone]  OFFICE PROGRESS NOTE  Kirsten Peak, Kirsten Williams [address] [place] [address]  DIAGNOSIS:  Stage IV (T4, N2, M1 a) non-small cell lung cancer, adenocarcinoma presented with multifocal disease involving the right upper lobe, right lower lobe as well as suspicious lower paratracheal lymphadenopathy and groundglass nodules in the left lung diagnosed in May 2022. The patient had molecular studies performed by [facility] and that showed no detectable mutation but this is likely secondary to low-level circulating free tumor DNA.  PRIOR THERAPY: None.  CURRENT THERAPY: palliative systemic chemotherapy with carboplatin for AUC of 5, Alimta 500 Mg/M2 and Avastin 15 Mg/KG every 3 weeks.  The patient is not a great candidate for immunotherapy because of her history of active systemic lupus erythematosus.  Starting from cycle #7 the patient is on maintenance treatment with Alimta and Avastin every 3 weeks.  She is status post 31 cycles.  INTERVAL HISTORY: Kirsten Williams 52 y.o. female returns to the clinic today for follow-up visit.  The patient is feeling much better today with no concerning complaints.  She was treated for parasitic infection with hookworms, Ancylostoma doudenale.  She denied having any current chest pain, shortness of breath, cough or hemoptysis.  She has no nausea, vomiting, diarrhea or constipation.  She has no headache or visual changes.  She denied having any recent weight loss or night sweats.  She is here today for evaluation before starting cycle #32 of her treatment.   MEDICAL HISTORY: Past Medical History:  Diagnosis Date   Adenocarcinoma, lung, right (HCC) 05/03/2021   Kirsten Williams is a 52 y.o. female with a history of lung nodules dating back to 2019 who is referred in consultation with Kirsten Peak, Kirsten Williams for assessment and management. She is a former smoker having quit in 2016. To  date, nodules have been stable as well as likely adrenal adenomas. She missed her screening CT last year and imaging was ordered last month. CT chest from 02-16-2021 reveals pro   Anxiety    GERD (gastroesophageal reflux disease)    SLE (systemic lupus erythematosus) (HCC) 05/03/2021   SLE (systemic lupus erythematosus) (HCC) 05/03/2021    ALLERGIES:  is allergic to clindamycin/lincomycin and carboplatin.  MEDICATIONS:  Current Outpatient Medications  Medication Sig Dispense Refill   amLODipine (NORVASC) 5 MG tablet Take 5 mg by mouth daily.     B Complex Vitamins (B COMPLEX 50 PO) Take by mouth.     Calcium Carbonate-Vit D-Min (CALCIUM 1200 PO) Take 1 capsule by mouth daily.     cholecalciferol (VITAMIN D3) 25 MCG (1000 UNIT) tablet Take 2,000 Units by mouth daily.     cyclobenzaprine (FLEXERIL) 10 MG tablet Take 10 mg by mouth at bedtime as needed for muscle spasms.     denosumab (PROLIA) 60 MG/ML SOSY injection Inject 60 mg into the skin every 6 (six) months.     dexamethasone (DECADRON) 4 MG tablet 4 mg p.o. twice daily the day before, day of and day after the chemotherapy every 3 weeks. 40 tablet 2   dicyclomine (BENTYL) 10 MG capsule Take 10 mg by mouth every 6 (six) hours as needed.     diphenhydrAMINE (BENADRYL) 25 MG tablet Take 25 mg by mouth daily as needed for allergies.     famotidine (PEPCID) 20 MG tablet Take 20 mg by mouth at bedtime.     fluticasone (FLONASE) 50 MCG/ACT nasal spray  Place 2 sprays into both nostrils daily.     folic acid (FOLVITE) 1 MG tablet TAKE 1 TABLET(1 MG) BY MOUTH DAILY 30 tablet 4   gabapentin (NEURONTIN) 300 MG capsule Take 300 mg by mouth 3 (three) times daily.     hydrocortisone 1 % lotion Apply 1 application topically 2 (two) times daily. 118 mL 0   hydroxychloroquine (PLAQUENIL) 200 MG tablet Take 200 mg by mouth 2 (two) times daily.     Multiple Vitamins-Minerals (MULTI ADULT GUMMIES) CHEW Chew 2 tablets by mouth daily.     omeprazole  (PRILOSEC) 40 MG capsule Take 40 mg by mouth daily as needed (acid reflux).     oxyCODONE-acetaminophen (PERCOCET/ROXICET) 5-325 MG tablet Take 1 tablet by mouth every 8 (eight) hours as needed for severe pain. 30 tablet 0   potassium chloride SA (KLOR-CON M) 20 MEQ tablet Take 1 tablet (20 mEq total) by mouth daily. 5 tablet 0   Probiotic Product (PROBIOTIC-10 PO) Take by mouth.     prochlorperazine (COMPAZINE) 10 MG tablet TAKE 1 TABLET(10 MG) BY MOUTH EVERY 6 HOURS AS NEEDED 30 tablet 2   rizatriptan (MAXALT-MLT) 10 MG disintegrating tablet Take 10 mg by mouth 2 (two) times daily as needed.     tetrahydrozoline 0.05 % ophthalmic solution Place 1 drop into both eyes once. Systane Eye Drops once daily both eyes     triamcinolone ointment (KENALOG) 0.5 % Apply topically 3 (three) times daily.     No current facility-administered medications for this visit.    SURGICAL HISTORY:  Past Surgical History:  Procedure Laterality Date   ABDOMINAL HYSTERECTOMY     BRONCHIAL BIOPSY  04/24/2021   Procedure: BRONCHIAL BIOPSIES;  Surgeon: Leslye Peer, MD;  Location: [facility] ENDOSCOPY;  Service: Pulmonary;;   BRONCHIAL BRUSHINGS  04/24/2021   Procedure: BRONCHIAL BRUSHINGS;  Surgeon: Leslye Peer, MD;  Location: [facility] ENDOSCOPY;  Service: Pulmonary;;   BRONCHIAL NEEDLE ASPIRATION BIOPSY  04/24/2021   Procedure: BRONCHIAL NEEDLE ASPIRATION BIOPSIES;  Surgeon: Leslye Peer, MD;  Location: [facility] ENDOSCOPY;  Service: Pulmonary;;   BRONCHIAL WASHINGS  04/24/2021   Procedure: BRONCHIAL WASHINGS;  Surgeon: Leslye Peer, MD;  Location: [facility] ENDOSCOPY;  Service: Pulmonary;;   CESAREAN SECTION  11/09/1990   DILATION AND CURETTAGE OF UTERUS Bilateral 09/09/1996   FIDUCIAL MARKER PLACEMENT  04/24/2021   Procedure: FIDUCIAL MARKER PLACEMENT;  Surgeon: Leslye Peer, MD;  Location: [facility] ENDOSCOPY;  Service: Pulmonary;;   TONSILLECTOMY  12/10/1977   VIDEO BRONCHOSCOPY WITH ENDOBRONCHIAL NAVIGATION Bilateral 04/24/2021    Procedure: VIDEO BRONCHOSCOPY WITH ENDOBRONCHIAL NAVIGATION;  Surgeon: Leslye Peer, MD;  Location: [facility] ENDOSCOPY;  Service: Pulmonary;  Laterality: Bilateral;    REVIEW OF SYSTEMS:  A comprehensive review of systems was negative.   PHYSICAL EXAMINATION: General appearance: alert, cooperative, and no distress Head: Normocephalic, without obvious abnormality, atraumatic Neck: no adenopathy, no JVD, supple, symmetrical, trachea midline, and thyroid not enlarged, symmetric, no tenderness/mass/nodules Lymph nodes: Cervical, supraclavicular, and axillary nodes normal. Resp: clear to auscultation bilaterally Back: symmetric, no curvature. ROM normal. No CVA tenderness. Cardio: regular rate and rhythm, S1, S2 normal, no murmur, click, rub or gallop GI: soft, non-tender; bowel sounds normal; no masses,  no organomegaly Extremities: extremities normal, atraumatic, no cyanosis or edema  ECOG PERFORMANCE STATUS: 1 - Symptomatic but completely ambulatory  Blood pressure (!) 152/82, pulse 87, temperature (!) 97.5 F (36.4 C), temperature source Temporal, resp. rate 16, weight 116 lb 9.6 oz (52.9 kg), SpO2  100 %.  LABORATORY DATA: Lab Results  Component Value Date   WBC 8.8 04/10/2023   HGB 12.6 04/10/2023   HCT 40.2 04/10/2023   MCV 101.0 (H) 04/10/2023   PLT 269 04/10/2023      Chemistry      Component Value Date/Time   NA 139 03/21/2023 1329   NA 140 03/13/2021 0000   K 3.9 03/21/2023 1329   CL 105 03/21/2023 1329   CO2 26 03/21/2023 1329   BUN 7 03/21/2023 1329   BUN 10 03/13/2021 0000   CREATININE 0.76 03/21/2023 1329   GLU 107 03/13/2021 0000      Component Value Date/Time   CALCIUM 9.7 03/21/2023 1329   ALKPHOS 178 (H) 03/21/2023 1329   AST 31 03/21/2023 1329   ALT 38 03/21/2023 1329   BILITOT 0.3 03/21/2023 1329       RADIOGRAPHIC STUDIES: No results found.  ASSESSMENT AND PLAN:  This is a very pleasant 52 years old white female recently diagnosed with a stage IV  non-small cell lung cancer, adenocarcinoma with no actionable mutation diagnosed in May 2022.  The patient has also history of systemic lupus erythematosus and she is not a candidate for immunotherapy. She is currently undergoing systemic chemotherapy with carboplatin for AUC of 5, Alimta 500 Mg/M2 and Avastin 15 Mg/KG every 3 weeks status post 31 cycles.  Starting from cycle #7 the patient is on maintenance treatment with Alimta and Avastin every 3 weeks. 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 The patient has been tolerating her treatment with maintenance Alimta and Avastin fairly well. I recommended for her to proceed with cycle #32 today as planned.  I will see her back for follow-up visit in 3 weeks for evaluation with repeat CT scan of the chest, abdomen and pelvis for restaging of her disease. The patient was advised to call immediately if she has any other concerning symptoms in the interval. The patient voices understanding of current disease status and treatment options and is in agreement with the current care plan.  All questions were answered. The patient knows to call the clinic with any problems, questions or concerns. We can certainly see the patient much sooner if necessary. The total time spent in the appointment was 20 minutes.  Disclaimer: This note was dictated with voice recognition software. Similar sounding words can inadvertently be transcribed and may not be corrected upon review.

## 2023-04-10 NOTE — Telephone Encounter (Signed)
Called patient regarding upcoming May appointments, patient is notified.  ?

## 2023-04-15 ENCOUNTER — Telehealth: Payer: Self-pay | Admitting: Pharmacy Technician

## 2023-04-15 NOTE — Telephone Encounter (Addendum)
Auth Submission: NO AUTH NEEDED Site of care: Site of care: [facility] Payer: [email or web address] Medication & CPT/J Code(s) submitted: Prolia (Denosumab) [id number] Route of submission (phone, fax, portal):  Phone # Fax # Auth type: Buy/Bill Units/visits requested: 2 Reference number: [id number] Approval from: 04/15/23 to 12/10/23  Co-pay card: Card id: [id number] Pt id: [id number] BIN: [id number] PCN: [id number] GR: [id number] EXP: 10/09/26 CVV: 508

## 2023-04-16 ENCOUNTER — Encounter: Payer: Self-pay | Admitting: Neurology

## 2023-04-17 ENCOUNTER — Other Ambulatory Visit: Payer: Self-pay

## 2023-04-18 ENCOUNTER — Other Ambulatory Visit: Payer: Self-pay

## 2023-04-26 NOTE — Progress Notes (Unsigned)
[facility] OFFICE PROGRESS NOTE  Kirsten Peak, PA-C [address] [place] [address]  DIAGNOSIS: Stage IV (T4, N2, M1 a) non-small cell lung cancer, adenocarcinoma presented with multifocal disease involving the right upper lobe, right lower lobe as well as suspicious lower paratracheal lymphadenopathy and groundglass nodules in the left lung diagnosed in May 2022. The patient had molecular studies performed by [facility] [employer] and that showed no detectable mutation but this is likely secondary to low-level circulating free tumor DNA.    PRIOR THERAPY: None  CURRENT THERAPY: Palliative systemic chemotherapy with carboplatin for AUC of 5, Alimta 500 Mg/M2 and Avastin 15 Mg/KG every 3 weeks.  The patient is not a great candidate for immunotherapy in the event that she has a genetic mutation.  She is status post 32 cycles. Starting from cycle #6, the patient will start maintenance Alimta and Avastin.    INTERVAL HISTORY: Kirsten Williams 52 y.o. female returns to the clinic today for a follow-up visit. The patient is feeling fairly well today without any concerning complaints. She is tolerating her treatment fairly well except she does sometimes have fatigue the week following treatment.  She denies any fever or chills.  She has occasional night sweats which is not new for her she has these intermittently.  She denies any significant shortness of breath unless with strenuous activity. Denies chest pain. She has intermittent cough but no significant cough recently.  She had some nausea after drinking the oral contrast. She also has been struggling with some dental concerns and needs dental work performed on her left molar. Therefore, she sometimes has a left sided headache. She is wondering if she would ever be eligible for a treatment break. She mentions she is receiving prolia. She recently had a restaging CT scan performed. She is here for evaluation and to review her scan before  starting cycle #33    MEDICAL HISTORY: Past Medical History:  Diagnosis Date   Adenocarcinoma, lung, right (HCC) 05/03/2021   Kirsten Williams is a 52 y.o. female with a history of lung nodules dating back to 2019 who is referred in consultation with Kirsten Peak, PA-C for assessment and management. She is a former smoker having quit in 2016. 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 She missed her screening CT last year and imaging was ordered last month. CT chest from 02-16-2021 reveals pro   Anxiety    GERD (gastroesophageal reflux disease)    SLE (systemic lupus erythematosus) (HCC) 05/03/2021   SLE (systemic lupus erythematosus) (HCC) 05/03/2021    ALLERGIES:  is allergic to clindamycin/lincomycin and carboplatin.  MEDICATIONS:  Current Outpatient Medications  Medication Sig Dispense Refill   amLODipine (NORVASC) 5 MG tablet Take 5 mg by mouth daily.     B Complex Vitamins (B COMPLEX 50 PO) Take by mouth.     Calcium Carbonate-Vit D-Min (CALCIUM 1200 PO) Take 1 capsule by mouth daily.     cholecalciferol (VITAMIN D3) 25 MCG (1000 UNIT) tablet Take 2,000 Units by mouth daily.     cyclobenzaprine (FLEXERIL) 10 MG tablet Take 10 mg by mouth at bedtime as needed for muscle spasms.     denosumab (PROLIA) 60 MG/ML SOSY injection Inject 60 mg into the skin every 6 (six) months.     dexamethasone (DECADRON) 4 MG tablet 4 mg p.o. twice daily the day before, day of and day after the chemotherapy every 3 weeks. 40 tablet 2  dicyclomine (BENTYL) 10 MG capsule Take 10 mg by mouth every 6 (six) hours as needed.     diphenhydrAMINE (BENADRYL) 25 MG tablet Take 25 mg by mouth daily as needed for allergies.     famotidine (PEPCID) 20 MG tablet Take 20 mg by mouth at bedtime.     fluticasone (FLONASE) 50 MCG/ACT nasal spray Place 2 sprays into both nostrils daily.     folic acid (FOLVITE) 1 MG tablet TAKE 1 TABLET(1 MG) BY MOUTH DAILY 30 tablet 4   gabapentin (NEURONTIN)  300 MG capsule Take 300 mg by mouth 3 (three) times daily.     hydrocortisone 1 % lotion Apply 1 application topically 2 (two) times daily. 118 mL 0   hydroxychloroquine (PLAQUENIL) 200 MG tablet Take 200 mg by mouth 2 (two) times daily.     Multiple Vitamins-Minerals (MULTI ADULT GUMMIES) CHEW Chew 2 tablets by mouth daily.     omeprazole (PRILOSEC) 40 MG capsule Take 40 mg by mouth daily as needed (acid reflux).     oxyCODONE-acetaminophen (PERCOCET/ROXICET) 5-325 MG tablet Take 1 tablet by mouth every 8 (eight) hours as needed for severe pain. 30 tablet 0   potassium chloride SA (KLOR-CON M) 20 MEQ tablet Take 1 tablet (20 mEq total) by mouth daily. 5 tablet 0   Probiotic Product (PROBIOTIC-10 PO) Take by mouth.     prochlorperazine (COMPAZINE) 10 MG tablet TAKE 1 TABLET(10 MG) BY MOUTH EVERY 6 HOURS AS NEEDED 30 tablet 2   rizatriptan (MAXALT-MLT) 10 MG disintegrating tablet Take 10 mg by mouth 2 (two) times daily as needed.     tetrahydrozoline 0.05 % ophthalmic solution Place 1 drop into both eyes once. Systane Eye Drops once daily both eyes     triamcinolone ointment (KENALOG) 0.5 % Apply topically 3 (three) times daily.     No current facility-administered medications for this visit.   Facility-Administered Medications Ordered in Other Visits  Medication Dose Route Frequency Provider Last Rate Last Admin   0.9 %  sodium chloride infusion   Intravenous Once Si Gaul, MD       bevacizumab-awwb (MVASI) 800 mg in sodium chloride 0.9 % 100 mL chemo infusion  15 mg/kg (Treatment Plan Recorded) Intravenous Once Si Gaul, MD       dexamethasone (DECADRON) 10 mg in sodium chloride 0.9 % 50 mL IVPB  10 mg Intravenous Once Si Gaul, MD       PEMEtrexed (ALIMTA) 800 mg in sodium chloride 0.9 % 100 mL chemo infusion  500 mg/m2 (Treatment Plan Recorded) Intravenous Once Si Gaul, MD       prochlorperazine (COMPAZINE) tablet 10 mg  10 mg Oral Once Si Gaul, MD         SURGICAL HISTORY:  Past Surgical History:  Procedure Laterality Date   ABDOMINAL HYSTERECTOMY     BRONCHIAL BIOPSY  04/24/2021   Procedure: BRONCHIAL BIOPSIES;  Surgeon: Leslye Peer, MD;  Location: [facility] ENDOSCOPY;  Service: Pulmonary;;   BRONCHIAL BRUSHINGS  04/24/2021   Procedure: BRONCHIAL BRUSHINGS;  Surgeon: Leslye Peer, MD;  Location[facility]COPY;  Service: Pulmonary;;   BRONCHIAL NEEDLE ASPIRATION BIOPSY  04/24/2021   Procedure: BRONCHIAL NEEDLE ASPIRATION BIOPSIES;  Surgeon: Leslye Peer, MD;  Location: [facility] ENDOSCOPY;  Service: Pulmonary;;   BRONCHIAL WASHINGS  04/24/2021   Procedure: BRONCHIAL WASHINGS;  Surgeon: Leslye Peer, MD;  Location: [facility] ENDOSCOPY;  Service: Pulmonary;;   CESAREAN SECTION  11/09/1990   DILATION AND CURETTAGE OF UTERUS Bilateral 09/09/1996  FIDUCIAL MARKER PLACEMENT  04/24/2021   Procedure: FIDUCIAL MARKER PLACEMENT;  Surgeon: Leslye Peer, MD;  Location[facility] ENDOSCOPY;  Service: Pulmonary;;   TONSILLECTOMY  12/10/1977   VIDEO BRONCHOSCOPY WITH ENDOBRONCHIAL NAVIGATION Bilateral 04/24/2021   Procedure: VIDEO BRONCHOSCOPY WITH ENDOBRONCHIAL NAVIGATION;  Surgeon: Leslye Peer, MD;  Location: [facility] ENDOSCOPY;  Service: Pulmonary;  Laterality: Bilateral;    REVIEW OF SYSTEMS:   Constitutional: Positive for fatigue 1 week following treatment typically.  Constitutional: Negative for appetite change, chills, fever and unexpected weight change.  HENT:   Negative for mouth sores, nosebleeds, sore throat and trouble swallowing.   Eyes: Negative for eye problems and icterus.  Respiratory: Positive for stable intermittent shortness of breath with certain activities. Negative for hemoptysis and wheezing.  Cardiovascular: Negative for chest pain and leg swelling.  Gastrointestinal: Positive for nausea and vomiting after CT scan and changes with bowel movements. Negative for abdominal pain. Genitourinary: Negative for bladder incontinence, difficulty  urinating, dysuria, frequency and hematuria.   Musculoskeletal: Negative for back pain, gait problem, neck pain and neck stiffness.  Skin: Negative for itching and rash.  Neurological: Positive for occasional left sided headache. Negative for dizziness, extremity weakness, gait problem, light-headedness and seizures.  Hematological: Negative for adenopathy. Does not bruise/bleed easily.  Psychiatric/Behavioral: Negative for confusion, depression and sleep disturbance. The patient is not nervous/anxious.     PHYSICAL EXAMINATION:  Blood pressure (!) 154/92, pulse 100, temperature (!) 97.3 F (36.3 C), resp. rate 17, weight 112 lb 12.8 oz (51.2 kg), SpO2 99 %.  ECOG PERFORMANCE STATUS: 1  Physical Exam  Constitutional: Oriented to person, place, and time and well-developed, well-nourished, and in no distress. No distress.  HENT:  Head: Normocephalic and atraumatic.  Mouth/Throat: Oropharynx is clear and moist. No oropharyngeal exudate.  Eyes: Conjunctivae are normal. Right eye exhibits no discharge. Left eye exhibits no discharge. No scleral icterus.  Neck: Normal range of motion. Neck supple.  Cardiovascular: Normal rate, regular rhythm, normal heart sounds and intact distal pulses.   Pulmonary/Chest: Effort normal and breath sounds normal. No respiratory distress. No wheezes. No rales.  Abdominal: Soft. Bowel sounds are normal. Exhibits no distension and no mass. There is no tenderness.  Musculoskeletal: Normal range of motion. Exhibits no edema.  Lymphadenopathy:    No cervical adenopathy.  Neurological: Alert and oriented to person, place, and time. Exhibits normal muscle tone. Gait normal. Coordination normal.  Skin: Skin is warm and dry. No rash noted. Not diaphoretic. No erythema. No pallor.  Psychiatric: Mood, memory and judgment normal.  Vitals reviewed.  LABORATORY DATA: Lab Results  Component Value Date   WBC 10.6 (H) 05/01/2023   HGB 13.2 05/01/2023   HCT 40.6  05/01/2023   MCV 97.6 05/01/2023   PLT 313 05/01/2023      Chemistry      Component Value Date/Time   NA 138 05/01/2023 1006   NA 140 03/13/2021 0000   K 3.8 05/01/2023 1006   CL 103 05/01/2023 1006   CO2 25 05/01/2023 1006   BUN 9 05/01/2023 1006   BUN 10 03/13/2021 0000   CREATININE 0.85 05/01/2023 1006   GLU 107 03/13/2021 0000      Component Value Date/Time   CALCIUM 9.9 05/01/2023 1006   ALKPHOS 116 05/01/2023 1006   AST 24 05/01/2023 1006   ALT 17 05/01/2023 1006   BILITOT 0.3 05/01/2023 1006       RADIOGRAPHIC STUDIES:  CT CHEST ABDOMEN PELVIS W CONTRAST  Result Date:  04/30/2023 CLINICAL DATA:  Restaging non-small cell lung cancer. * Tracking Code: BO * EXAM: CT CHEST, ABDOMEN, AND PELVIS WITH CONTRAST TECHNIQUE: Multidetector CT imaging of the chest, abdomen and pelvis was performed following the standard protocol during bolus administration of intravenous contrast. RADIATION DOSE REDUCTION: This exam was performed according to the departmental dose-optimization program which includes automated exposure control, adjustment of the mA and/or kV according to patient size and/or use of iterative reconstruction technique. CONTRAST:  OMNIPAQUE IOHEXOL 300 MG/ML  SOLN COMPARISON:  01/30/2023 FINDINGS: CT CHEST FINDINGS Cardiovascular: Normal heart size. Aortic atherosclerosis. No pericardial effusion. Mediastinum/Nodes: Thyroid gland, trachea and esophagus are unremarkable. Right paratracheal lymph node measures 1.1 cm, image 23/2. Unchanged from previous exam. No enlarged hilar or axillary lymph nodes. Lungs/Pleura: Mild emphysema. No pleural effusion. No airspace consolidation or pneumothorax. -part solid nodule within the superior segment of the right lower lobe measures 2.2 x 1.5 cm, image 65/4. On the previous exam this measured 2.1 x 1.3 cm (when remeasured.) -subsolid nodule in the posterior right upper lobe measures 1.4 cm, image 37/4. Previously 1.3 cm. -non solid  nodule within the periphery of the left upper lobe measures 2.1 cm, image 42/2. Unchanged from previous exam. -additional non solid nodules within the left upper lobe are unchanged from the previous exam -Stable 3 mm nodule in the periphery of the right upper lobe, image 55/4. -3 mm solid nodule in the periphery of the right upper lobe is stable, image 51/4. Musculoskeletal: No chest wall mass or suspicious bone lesions identified. CT ABDOMEN PELVIS FINDINGS Hepatobiliary: No focal liver abnormality is seen. No gallstones, gallbladder wall thickening, or biliary dilatation. Pancreas: Unremarkable. No pancreatic ductal dilatation or surrounding inflammatory changes. Spleen: Normal in size without focal abnormality. Adrenals/Urinary Tract: Bilateral adrenal adenomas appear unchanged. No follow-up imaging recommended. No nephrolithiasis, hydronephrosis or suspicious kidney mass. Urinary bladder appears normal. Stomach/Bowel: Stomach is within normal limits. Appendix appears normal. No evidence of bowel wall thickening, distention, or inflammatory changes. Vascular/Lymphatic: Aortic atherosclerosis. No aneurysm. No signs of abdominopelvic adenopathy. Reproductive: Status post hysterectomy. No adnexal masses. Other: No ascites or focal fluid collections. Musculoskeletal: No acute or significant osseous findings. IMPRESSION: 1. There is been no significant change in the appearance of scattered non-solid, sub solid and solid nodules. The scattered non solid and sub solid nodules remain suspicious for multifocal pulmonary adenocarcinoma. 2. No signs of nodal metastasis or metastatic disease to the abdomen or pelvis. 3. Stable appearance of bilateral adrenal adenomas. No follow-up imaging recommended. 4. Aortic Atherosclerosis (ICD10-I70.0) and Emphysema (ICD10-J43.9). Electronically Signed   By: Signa Kell M.D.   On: 04/30/2023 13:22     ASSESSMENT/PLAN:  This is a very pleasant 52 year old Caucasian female  diagnosed with stage IV (T4, N2, M1 a) non-small cell lung cancer, adenocarcinoma presented with multifocal disease involving the right upper lobe, right lower lobe as well as suspicious lower paratracheal lymphadenopathy and groundglass nodules in the left lung diagnosed in May 2022. The patient had molecular studies performed by [facility] [employer] and that showed no detectable mutation but this is likely secondary to low-level circulating free tumor DNA. If the patient has disease progression in the future, then we will likely retest her for molecular studies.   The patient is currently undergoing systemic chemotherapy with carboplatin for an AUC 5, Alimta 500 mg per metered square, and and Avastin 1500 mg/kg IV every 3 weeks.  She is status post 32 cycles.  Starting from cycle #7, the patient has been  on maintenance treatment with Alimta and Avastin.   She recently had a restaging CT scan. The patient was seen with Dr. Arbutus Ped. Dr. Arbutus Ped personally and independently reviewed the scan and discussed the results with the patient today. The scan showed stable disease.   We discussed the risks and benefits of stopping treatment. Of course, stopping treatment could cause the cancer to grow. She would like to continue with treatment for now as she is tolerating it well.   Labs were reviewed. Recommend she proceed with cycle #33 today as scheduled. She is ok to treat with the urine protein of 100.    The patient was advised to call immediately if she has any concerning symptoms in the interval. The patient voices understanding of current disease status and treatment options and is in agreement with the current care plan. All questions were answered. The patient knows to call the clinic with any problems, questions or concerns. We can certainly see the patient much sooner if necessary.  No orders of the defined types were placed in this encounter.    Vanna Sailer L Lonette Stevison,  PA-C 05/01/23  ADDENDUM: Hematology/Oncology Attending: I had a face-to-face encounter with the patient today.  I reviewed her record, lab, scan and recommended her care plan.  This is a very pleasant 52 years old white female with stage IV non-small cell lung cancer, adenocarcinoma diagnosed in May 2022 with no actionable mutations detected because of low level circulating tumor DNA. The patient initially started treatment with induction systemic chemotherapy with carboplatin, Alimta and Avastin for 6 cycles and currently on maintenance treatment with Alimta and Avastin for 26 more cycles.  She was not a candidate for treatment with immunotherapy because of systemic lupus erythematosus. She has been tolerating her treatment fairly well with no concerning adverse effects. She had repeat CT scan of the chest, abdomen and pelvis performed recently.  I personally and independently reviewed the scan and discussed the result with the patient today. Her scan showed no concerning findings for disease progression. I recommended for her to continue her current treatment with maintenance Alimta and Avastin. She will proceed with cycle #33 today. She will come back for follow-up visit in 3 weeks for evaluation before the next cycle of her treatment. The patient asking about the possibility of taking break of chemotherapy and I explained to the patient that we will be happy to consider her for this option if she has any unacceptable toxicity or based on her wishes. The patient was advised to call immediately if she has any other concerning symptoms in the interval. The total time spent in the appointment was 30 minutes. Disclaimer: This note was dictated with voice recognition software. Similar sounding words can inadvertently be transcribed and may be missed upon review. Lajuana Matte, MD

## 2023-04-29 ENCOUNTER — Other Ambulatory Visit: Payer: Self-pay | Admitting: Internal Medicine

## 2023-04-29 ENCOUNTER — Encounter (HOSPITAL_BASED_OUTPATIENT_CLINIC_OR_DEPARTMENT_OTHER): Payer: Self-pay

## 2023-04-29 ENCOUNTER — Ambulatory Visit (HOSPITAL_BASED_OUTPATIENT_CLINIC_OR_DEPARTMENT_OTHER)
Admission: RE | Admit: 2023-04-29 | Discharge: 2023-04-29 | Disposition: A | Discharge status: Home / Self Care | Payer: Commercial Managed Care - PPO | Source: Ambulatory Visit | Attending: Internal Medicine | Admitting: Internal Medicine

## 2023-04-29 DIAGNOSIS — C3490 Malignant neoplasm of unspecified part of unspecified bronchus or lung: Secondary | ICD-10-CM | POA: Diagnosis present

## 2023-04-29 DIAGNOSIS — C3491 Malignant neoplasm of unspecified part of right bronchus or lung: Secondary | ICD-10-CM

## 2023-04-29 MED ORDER — IOHEXOL 300 MG/ML  SOLN
100.0000 mL | Freq: Once | INTRAMUSCULAR | Status: AC | PRN
  Administered 2023-04-29: 100 mL via INTRAVENOUS

## 2023-04-30 MED FILL — Dexamethasone Sodium Phosphate Inj 100 MG/10ML: INTRAMUSCULAR | Qty: 1 | Status: AC

## 2023-05-01 ENCOUNTER — Inpatient Hospital Stay: Payer: Commercial Managed Care - PPO

## 2023-05-01 ENCOUNTER — Inpatient Hospital Stay (HOSPITAL_BASED_OUTPATIENT_CLINIC_OR_DEPARTMENT_OTHER): Payer: Commercial Managed Care - PPO | Admitting: Physician Assistant

## 2023-05-01 ENCOUNTER — Other Ambulatory Visit: Payer: Self-pay

## 2023-05-01 VITALS — BP 154/92 | HR 100 | Temp 97.3°F | Resp 17 | Wt 112.8 lb

## 2023-05-01 DIAGNOSIS — C3491 Malignant neoplasm of unspecified part of right bronchus or lung: Secondary | ICD-10-CM

## 2023-05-01 DIAGNOSIS — Z5111 Encounter for antineoplastic chemotherapy: Secondary | ICD-10-CM

## 2023-05-01 DIAGNOSIS — Z5112 Encounter for antineoplastic immunotherapy: Secondary | ICD-10-CM | POA: Diagnosis not present

## 2023-05-01 LAB — CMP (CANCER CENTER ONLY)
ALT: 17 U/L (ref 0–44)
AST: 24 U/L (ref 15–41)
Albumin: 4.2 g/dL (ref 3.5–5.0)
Alkaline Phosphatase: 116 U/L (ref 38–126)
Anion gap: 10 (ref 5–15)
BUN: 9 mg/dL (ref 6–20)
CO2: 25 mmol/L (ref 22–32)
Calcium: 9.9 mg/dL (ref 8.9–10.3)
Chloride: 103 mmol/L (ref 98–111)
Creatinine: 0.85 mg/dL (ref 0.44–1.00)
GFR, Estimated: >60 mL/min (ref >60)
Glucose, Bld: 191 mg/dL — ABNORMAL HIGH (ref 70–99)
Potassium: 3.8 mmol/L (ref 3.5–5.1)
Sodium: 138 mmol/L (ref 135–145)
Total Bilirubin: 0.3 mg/dL (ref 0.3–1.2)
Total Protein: 8.1 g/dL (ref 6.5–8.1)

## 2023-05-01 LAB — CBC WITH DIFFERENTIAL (CANCER CENTER ONLY)
Abs Immature Granulocytes: 0.03 10*3/uL (ref 0.00–0.07)
Basophils Absolute: 0.1 10*3/uL (ref 0.0–0.1)
Basophils Relative: 1 %
Eosinophils Absolute: 0.1 10*3/uL (ref 0.0–0.5)
Eosinophils Relative: 1 %
HCT: 40.6 % (ref 36.0–46.0)
Hemoglobin: 13.2 g/dL (ref 12.0–15.0)
Immature Granulocytes: 0 %
Lymphocytes Relative: 13 %
Lymphs Abs: 1.4 10*3/uL (ref 0.7–4.0)
MCH: 31.7 pg (ref 26.0–34.0)
MCHC: 32.5 g/dL (ref 30.0–36.0)
MCV: 97.6 fL (ref 80.0–100.0)
Monocytes Absolute: 0.7 10*3/uL (ref 0.1–1.0)
Monocytes Relative: 6 %
Neutro Abs: 8.4 10*3/uL — ABNORMAL HIGH (ref 1.7–7.7)
Neutrophils Relative %: 79 %
Platelet Count: 313 10*3/uL (ref 150–400)
RBC: 4.16 MIL/uL (ref 3.87–5.11)
RDW: 17.3 % — ABNORMAL HIGH (ref 11.5–15.5)
WBC Count: 10.6 10*3/uL — ABNORMAL HIGH (ref 4.0–10.5)
nRBC: 0 % (ref 0.0–0.2)

## 2023-05-01 LAB — TOTAL PROTEIN, URINE DIPSTICK: Protein, ur: 100 mg/dL — AB

## 2023-05-01 MED ORDER — SODIUM CHLORIDE 0.9 % IV SOLN: Freq: Once | INTRAVENOUS | Status: AC

## 2023-05-01 MED ORDER — SODIUM CHLORIDE 0.9 % IV SOLN
10.0000 mg | Freq: Once | INTRAVENOUS | Status: AC
  Administered 2023-05-01: 10 mg via INTRAVENOUS
  Filled 2023-05-01: qty 10

## 2023-05-01 MED ORDER — PROCHLORPERAZINE MALEATE 10 MG PO TABS
10.0000 mg | ORAL_TABLET | Freq: Once | ORAL | Status: AC
  Administered 2023-05-01: 10 mg via ORAL
  Filled 2023-05-01: qty 1

## 2023-05-01 MED ORDER — SODIUM CHLORIDE 0.9 % IV SOLN
15.0000 mg/kg | Freq: Once | INTRAVENOUS | Status: AC
  Administered 2023-05-01: 800 mg via INTRAVENOUS
  Filled 2023-05-01: qty 32

## 2023-05-01 MED ORDER — SODIUM CHLORIDE 0.9 % IV SOLN
500.0000 mg/m2 | Freq: Once | INTRAVENOUS | Status: AC
  Administered 2023-05-01: 800 mg via INTRAVENOUS
  Filled 2023-05-01: qty 20

## 2023-05-01 NOTE — Patient Instructions (Signed)
[facility]  Discharge Instructions: Thank you for choosing [facility] to provide your oncology and hematology care.   If you have a lab appointment with the Cancer Center, please go directly to the Cancer Center and check in at the registration area.   Wear comfortable clothing and clothing appropriate for easy access to any Portacath or PICC line.   We strive to give you quality time with your provider. You may need to reschedule your appointment if you arrive late (15 or more minutes).  Arriving late affects you and other patients whose appointments are after yours.  Also, if you miss three or more appointments without notifying the office, you may be dismissed from the clinic at the provider's discretion.      For prescription refill requests, have your pharmacy contact our office and allow 72 hours for refills to be completed.    Today you received the following chemotherapy and/or immunotherapy agents: Mvasi, Alimta      To help prevent nausea and vomiting after your treatment, we encourage you to take your nausea medication as directed.  BELOW ARE SYMPTOMS THAT SHOULD BE REPORTED IMMEDIATELY: *FEVER GREATER THAN 100.4 F (38 C) OR HIGHER *CHILLS OR SWEATING *NAUSEA AND VOMITING THAT IS NOT CONTROLLED WITH YOUR NAUSEA MEDICATION *UNUSUAL SHORTNESS OF BREATH *UNUSUAL BRUISING OR BLEEDING *URINARY PROBLEMS (pain or burning when urinating, or frequent urination) *BOWEL PROBLEMS (unusual diarrhea, constipation, pain near the anus) TENDERNESS IN MOUTH AND THROAT WITH OR WITHOUT PRESENCE OF ULCERS (sore throat, sores in mouth, or a toothache) UNUSUAL RASH, SWELLING OR PAIN  UNUSUAL VAGINAL DISCHARGE OR ITCHING   Items with * indicate a potential emergency and should be followed up as soon as possible or go to the Emergency Department if any problems should occur.  Please show the CHEMOTHERAPY ALERT CARD or IMMUNOTHERAPY ALERT CARD at  check-in to the Emergency Department and triage nurse.  Should you have questions after your visit or need to cancel or reschedule your appointment, please contact [facility]  Dept: [phone]  and follow the prompts.  Office hours are 8:00 a.m. to 4:30 p.m. Monday - Friday. Please note that voicemails left after 4:00 p.m. may not be returned until the following business day.  We are closed weekends and major holidays. You have access to a nurse at all times for urgent questions. Please call the main number to the clinic Dept: [phone] and follow the prompts.   For any non-urgent questions, you may also contact your provider using MyChart. We now offer e-Visits for anyone 18 and older to request care online for non-urgent symptoms. For details visit [email or web address].   Also download the MyChart app! Go to the app store, search "MyChart", open the app, select [place], and log in with your MyChart username and password.   

## 2023-05-01 NOTE — Progress Notes (Signed)
Ok for bevacizumab with urine protein = 100 per [place], [place]

## 2023-05-02 ENCOUNTER — Encounter: Payer: Self-pay | Admitting: Internal Medicine

## 2023-05-02 ENCOUNTER — Encounter: Payer: Self-pay | Admitting: Sports Medicine

## 2023-05-05 ENCOUNTER — Other Ambulatory Visit: Payer: Self-pay

## 2023-05-08 ENCOUNTER — Ambulatory Visit (INDEPENDENT_AMBULATORY_CARE_PROVIDER_SITE_OTHER): Payer: Commercial Managed Care - PPO

## 2023-05-08 VITALS — BP 142/88 | HR 86 | Temp 97.9°F | Resp 16 | Ht 61.0 in | Wt 113.0 lb

## 2023-05-08 DIAGNOSIS — M810 Age-related osteoporosis without current pathological fracture: Secondary | ICD-10-CM

## 2023-05-08 MED ORDER — DENOSUMAB 60 MG/ML ~~LOC~~ SOSY
60.0000 mg | PREFILLED_SYRINGE | Freq: Once | SUBCUTANEOUS | Status: AC
  Administered 2023-05-08: 60 mg via SUBCUTANEOUS
  Filled 2023-05-08: qty 1

## 2023-05-08 NOTE — Progress Notes (Signed)
Diagnosis: Osteoporosis  Provider:  Chilton Greathouse MD  Procedure: Injection  Prolia (Denosumab), Dose: 60 mg, Site: subcutaneous, Number of injections: 1  Post Care:  n/a not first injection   Discharge: Condition: Good, Destination: Home . AVS Provided  Performed by:  Loney Hering, LPN

## 2023-05-09 ENCOUNTER — Other Ambulatory Visit: Payer: Self-pay

## 2023-05-21 ENCOUNTER — Ambulatory Visit (INDEPENDENT_AMBULATORY_CARE_PROVIDER_SITE_OTHER): Payer: Commercial Managed Care - PPO | Admitting: Neurology

## 2023-05-21 ENCOUNTER — Encounter: Payer: Self-pay | Admitting: Neurology

## 2023-05-21 VITALS — BP 146/73 | HR 104 | Ht 61.0 in | Wt 111.0 lb

## 2023-05-21 DIAGNOSIS — M5417 Radiculopathy, lumbosacral region: Secondary | ICD-10-CM

## 2023-05-21 MED FILL — Dexamethasone Sodium Phosphate Inj 100 MG/10ML: INTRAMUSCULAR | Qty: 1 | Status: AC

## 2023-05-21 NOTE — Progress Notes (Signed)
[facility] Neurology [facility] Clinic Note - Initial Visit   Date: 05/21/2023   Kirsten Williams MRN: [id number] DOB: Feb 07, 1971   Dear Kirsten Peak, PA-C:  Thank you for your kind referral of Kirsten Williams for consultation of bilateral feet paresthesias. Although her history is well known to you, please allow [place] to reiterate it for the purpose of our medical record. The patient was accompanied to the clinic by self.     Kirsten Williams is a 52 y.o. right-handed female with right lung adenocarcinoma, SLE, and GERD presenting for evaluation of bilateral feet paresthesias.   IMPRESSION/PLAN: Lumbar radiculopathy at L5 manifesting with bilateral feet paresthesias over the L5 dermatome and chronic low back pain.  NCS/EMG from 08/2022 shows chronic right L5 radiculopathy, no evidence of large fiber neuropathy. Exam shows diminished sensation over the dorsum of the feet with intact strength and reflexes.   - Start physical therapy for low back strengthening  - increase gabapentin to 600mg  twice daily  - If no improvement, MRI lumbar spine will be ordered next  Return to clinic in 4 months  ------------------------------------------------------------- History of present illness: Starting around 2022, she began having numbness/tingling over the dorsum of the feet and lower legs.  Symptoms have intensified over time and progress as the day goes on.  She takes gabapentin 300mg  twice daily and sometimes will take up to 6 times daily.  She also takes hydrocodone for right ankle pain.  She had low back pain. She has not done PT.    Out-side paper records, electronic medical record, and images have been reviewed where available and summarized as:  NCS/EMG legs 08/14/2022: Chronic L5 radiculopathy affecting the right lower extremity, mild. There is no evidence of a large fiber sensorimotor polyneuropathy affecting the lower extremities.  MRI brain wwo contrast 02/02/2023: 1. No  evidence of intracranial metastases or acute intracranial abnormality. 2. Unchanged 3 mm left temporal meningioma.  Lab Results  Component Value Date   TSH 0.530 10/24/2022    Past Medical History:  Diagnosis Date   Adenocarcinoma, lung, right (HCC) 05/03/2021   Kirsten Williams is a 52 y.o. female with a history of lung nodules dating back to 2019 who is referred in consultation with Kirsten Peak, PA-C for assessment and management. She is a former smoker having quit in 2016. 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 She missed her screening CT last year and imaging was ordered last month. CT chest from 02-16-2021 reveals pro   Anxiety    GERD (gastroesophageal reflux disease)    SLE (systemic lupus erythematosus) (HCC) 05/03/2021   SLE (systemic lupus erythematosus) (HCC) 05/03/2021    Past Surgical History:  Procedure Laterality Date   ABDOMINAL HYSTERECTOMY     BRONCHIAL BIOPSY  04/24/2021   Procedure: BRONCHIAL BIOPSIES;  Surgeon: Leslye Peer, MD;  Location: [facility] ENDOSCOPY;  Service: Pulmonary;;   BRONCHIAL BRUSHINGS  04/24/2021   Procedure: BRONCHIAL BRUSHINGS;  Surgeon: Leslye Peer, MD;  Location: [facility] ENDOSCOPY;  Service: Pulmonary;;   BRONCHIAL NEEDLE ASPIRATION BIOPSY  04/24/2021   Procedure: BRONCHIAL NEEDLE ASPIRATION BIOPSIES;  Surgeon: Leslye Peer, MD;  Location: [facility] ENDOSCOPY;  Service: Pulmonary;;   BRONCHIAL WASHINGS  04/24/2021   Procedure: BRONCHIAL WASHINGS;  Surgeon: Leslye Peer, MD;  Location: [facility] ENDOSCOPY;  Service: Pulmonary;;   CESAREAN SECTION  11/09/1990   DILATION AND CURETTAGE OF UTERUS Bilateral 09/09/1996   FIDUCIAL MARKER PLACEMENT  04/24/2021   Procedure:  FIDUCIAL MARKER PLACEMENT;  Surgeon: Leslye Peer, MD;  Location: [facility] ENDOSCOPY;  Service: Pulmonary;;   TONSILLECTOMY  12/10/1977   VIDEO BRONCHOSCOPY WITH ENDOBRONCHIAL NAVIGATION Bilateral 04/24/2021   Procedure: VIDEO BRONCHOSCOPY WITH ENDOBRONCHIAL NAVIGATION;   Surgeon: Leslye Peer, MD;  Location: [facility] ENDOSCOPY;  Service: Pulmonary;  Laterality: Bilateral;     Medications:  Outpatient Encounter Medications as of 05/21/2023  Medication Sig   amLODipine (NORVASC) 5 MG tablet Take 5 mg by mouth daily.   B Complex Vitamins (B COMPLEX 50 PO) Take by mouth.   Calcium Carbonate-Vit D-Min (CALCIUM 1200 PO) Take 1 capsule by mouth daily.   cholecalciferol (VITAMIN D3) 25 MCG (1000 UNIT) tablet Take 2,000 Units by mouth daily.   cyclobenzaprine (FLEXERIL) 10 MG tablet Take 10 mg by mouth at bedtime as needed for muscle spasms.   denosumab (PROLIA) 60 MG/ML SOSY injection Inject 60 mg into the skin every 6 (six) months.   dexamethasone (DECADRON) 4 MG tablet 4 mg p.o. twice daily the day before, day of and day after the chemotherapy every 3 weeks.   dicyclomine (BENTYL) 10 MG capsule Take 10 mg by mouth every 6 (six) hours as needed.   diphenhydrAMINE (BENADRYL) 25 MG tablet Take 25 mg by mouth daily as needed for allergies.   famotidine (PEPCID) 20 MG tablet Take 20 mg by mouth at bedtime.   fluticasone (FLONASE) 50 MCG/ACT nasal spray Place 2 sprays into both nostrils daily.   folic acid (FOLVITE) 1 MG tablet TAKE 1 TABLET(1 MG) BY MOUTH DAILY   gabapentin (NEURONTIN) 300 MG capsule Take 300 mg by mouth 3 (three) times daily.   hydrocortisone 1 % lotion Apply 1 application topically 2 (two) times daily.   hydroxychloroquine (PLAQUENIL) 200 MG tablet Take 200 mg by mouth 2 (two) times daily.   Multiple Vitamins-Minerals (MULTI ADULT GUMMIES) CHEW Chew 2 tablets by mouth daily.   omeprazole (PRILOSEC) 40 MG capsule Take 40 mg by mouth daily as needed (acid reflux).   oxyCODONE-acetaminophen (PERCOCET/ROXICET) 5-325 MG tablet Take 1 tablet by mouth every 8 (eight) hours as needed for severe pain.   potassium chloride SA (KLOR-CON M) 20 MEQ tablet Take 1 tablet (20 mEq total) by mouth daily.   Probiotic Product (PROBIOTIC-10 PO) Take by mouth.    prochlorperazine (COMPAZINE) 10 MG tablet TAKE 1 TABLET(10 MG) BY MOUTH EVERY 6 HOURS AS NEEDED   rizatriptan (MAXALT-MLT) 10 MG disintegrating tablet Take 10 mg by mouth 2 (two) times daily as needed.   tetrahydrozoline 0.05 % ophthalmic solution Place 1 drop into both eyes once. Systane Eye Drops once daily both eyes   triamcinolone ointment (KENALOG) 0.5 % Apply topically 3 (three) times daily.   No facility-administered encounter medications on file as of 05/21/2023.    Allergies:  Allergies  Allergen Reactions   Clindamycin/Lincomycin Itching and Nausea And Vomiting   Carboplatin Itching and Other (See Comments)    Erythema to hands/feet/arms/perineal area  Elevated BP     Family History: Family History  Problem Relation Age of Onset   Stroke Mother        4 strokes   Heart Problems Mother    Stroke Father    Cancer Father    Neuropathy Father    Hypertension Sister     Social History: Social History   Tobacco Use   Smoking status: Former    Packs/day: 1.50    Years: 32.00    Additional pack years: 0.00    Total  pack years: 48.00    Types: Cigarettes    Quit date: 12/10/2014    Years since quitting: 8.4   Smokeless tobacco: Never  Vaping Use   Vaping Use: Never used  Substance Use Topics   Alcohol use: Yes    Alcohol/week: 3.0 standard drinks of alcohol    Types: 3 Shots of liquor per week    Comment: daily   Drug use: Never   Social History   Social History Narrative   Right Handed    Lives in a two story home. Lives with husband.    Vital Signs:  BP (!) 146/73   Pulse (!) 104   Ht 5\' 1"  (1.549 m)   Wt 111 lb (50.3 kg)   SpO2 99%   BMI 20.97 kg/m    Neurological Exam: MENTAL STATUS including orientation to time, place, person, recent and remote memory, attention span and concentration, language, and fund of knowledge is normal.  Speech is not dysarthric.  CRANIAL NERVES: II:  No visual field defects.     III-IV-VI: Pupils equal round and  reactive to light.  Normal conjugate, extra-ocular eye movements in all directions of gaze.  No nystagmus.  No ptosis.   V:  Normal facial sensation.    VII:  Normal facial symmetry and movements.   VIII:  Normal hearing and vestibular function.   IX-X:  Normal palatal movement.   XI:  Normal shoulder shrug and head rotation.   XII:  Normal tongue strength and range of motion, no deviation or fasciculation.  MOTOR:  No atrophy, fasciculations or abnormal movements.  No pronator drift.   Upper Extremity:  Right  Left  Deltoid  5/5   5/5   Biceps  5/5   5/5   Triceps  5/5   5/5   Wrist extensors  5/5   5/5   Wrist flexors  5/5   5/5   Finger extensors  5/5   5/5   Finger flexors  5/5   5/5   Dorsal interossei  5/5   5/5   Abductor pollicis  5/5   5/5   Tone (Ashworth scale)  0  0   Lower Extremity:  Right  Left  Hip flexors  5/5   5/5   Knee flexors  5/5   5/5   Knee extensors  5/5   5/5   Dorsiflexors  5/5   5/5   Plantarflexors  5/5   5/5   Toe extensors  5/5   5/5   Toe flexors  5/5   5/5   Tone (Ashworth scale)  0  0   MSRs:                                           Right        Left brachioradialis 2+  2+  biceps 2+  2+  triceps 2+  2+  patellar 2+  2+  ankle jerk 2+  2+  Hoffman no  no  plantar response down  down   SENSORY:  Hyperesthesia to pin prick, temperature, and vibration over the dorsum of both feet.  Sensation intact above the ankles, soles of the feet and hands.   COORDINATION/GAIT: Normal finger-to- nose-finger.  Intact rapid alternating movements bilaterally.  Antalgic gait due to right ankle pain, unassisted and stable.    Thank you for allowing me to  participate in patient's care.  If I can answer any additional questions, I would be pleased to do so.    Sincerely,    Ihan Pat K. Allena Katz, DO

## 2023-05-21 NOTE — Patient Instructions (Addendum)
You can increase gabapentin to 600mg  twice daily  Start physical therapy for low back strengthening  After completing therapy for 2 months, please call the office with a follow-up whether therapy is helping or not   Follow-up in 4 months

## 2023-05-22 ENCOUNTER — Inpatient Hospital Stay: Payer: Commercial Managed Care - PPO | Attending: Hematology and Oncology

## 2023-05-22 ENCOUNTER — Other Ambulatory Visit: Payer: Self-pay

## 2023-05-22 ENCOUNTER — Inpatient Hospital Stay (HOSPITAL_BASED_OUTPATIENT_CLINIC_OR_DEPARTMENT_OTHER): Payer: Commercial Managed Care - PPO | Admitting: Internal Medicine

## 2023-05-22 ENCOUNTER — Inpatient Hospital Stay: Payer: Commercial Managed Care - PPO

## 2023-05-22 VITALS — BP 155/97 | HR 91

## 2023-05-22 DIAGNOSIS — C3491 Malignant neoplasm of unspecified part of right bronchus or lung: Secondary | ICD-10-CM

## 2023-05-22 DIAGNOSIS — Z5112 Encounter for antineoplastic immunotherapy: Secondary | ICD-10-CM | POA: Insufficient documentation

## 2023-05-22 DIAGNOSIS — M329 Systemic lupus erythematosus, unspecified: Secondary | ICD-10-CM | POA: Diagnosis not present

## 2023-05-22 DIAGNOSIS — Z5111 Encounter for antineoplastic chemotherapy: Secondary | ICD-10-CM | POA: Diagnosis present

## 2023-05-22 DIAGNOSIS — C3411 Malignant neoplasm of upper lobe, right bronchus or lung: Secondary | ICD-10-CM | POA: Insufficient documentation

## 2023-05-22 LAB — CBC WITH DIFFERENTIAL (CANCER CENTER ONLY)
Abs Immature Granulocytes: 0.03 10*3/uL (ref 0.00–0.07)
Basophils Absolute: 0.1 10*3/uL (ref 0.0–0.1)
Basophils Relative: 1 %
Eosinophils Absolute: 0.2 10*3/uL (ref 0.0–0.5)
Eosinophils Relative: 2 %
HCT: 40.8 % (ref 36.0–46.0)
Hemoglobin: 12.8 g/dL (ref 12.0–15.0)
Immature Granulocytes: 0 %
Lymphocytes Relative: 24 %
Lymphs Abs: 2.1 10*3/uL (ref 0.7–4.0)
MCH: 31.1 pg (ref 26.0–34.0)
MCHC: 31.4 g/dL (ref 30.0–36.0)
MCV: 99.3 fL (ref 80.0–100.0)
Monocytes Absolute: 0.9 10*3/uL (ref 0.1–1.0)
Monocytes Relative: 11 %
Neutro Abs: 5.5 10*3/uL (ref 1.7–7.7)
Neutrophils Relative %: 62 %
Platelet Count: 298 10*3/uL (ref 150–400)
RBC: 4.11 MIL/uL (ref 3.87–5.11)
RDW: 17.1 % — ABNORMAL HIGH (ref 11.5–15.5)
WBC Count: 8.8 10*3/uL (ref 4.0–10.5)
nRBC: 0 % (ref 0.0–0.2)

## 2023-05-22 LAB — CMP (CANCER CENTER ONLY)
ALT: 13 U/L (ref 0–44)
AST: 21 U/L (ref 15–41)
Albumin: 3.8 g/dL (ref 3.5–5.0)
Alkaline Phosphatase: 119 U/L (ref 38–126)
Anion gap: 8 (ref 5–15)
BUN: 5 mg/dL — ABNORMAL LOW (ref 6–20)
CO2: 28 mmol/L (ref 22–32)
Calcium: 9.9 mg/dL (ref 8.9–10.3)
Chloride: 105 mmol/L (ref 98–111)
Creatinine: 0.68 mg/dL (ref 0.44–1.00)
GFR, Estimated: >60 mL/min (ref >60)
Glucose, Bld: 95 mg/dL (ref 70–99)
Potassium: 4.1 mmol/L (ref 3.5–5.1)
Sodium: 141 mmol/L (ref 135–145)
Total Bilirubin: 0.3 mg/dL (ref 0.3–1.2)
Total Protein: 8 g/dL (ref 6.5–8.1)

## 2023-05-22 LAB — TOTAL PROTEIN, URINE DIPSTICK: Protein, ur: 30 mg/dL — AB

## 2023-05-22 MED ORDER — PROCHLORPERAZINE MALEATE 10 MG PO TABS
10.0000 mg | ORAL_TABLET | Freq: Once | ORAL | Status: AC
  Administered 2023-05-22: 10 mg via ORAL
  Filled 2023-05-22: qty 1

## 2023-05-22 MED ORDER — SODIUM CHLORIDE 0.9% FLUSH: 10.0000 mL | INTRAVENOUS | Status: DC | PRN

## 2023-05-22 MED ORDER — SODIUM CHLORIDE 0.9 % IV SOLN
10.0000 mg | Freq: Once | INTRAVENOUS | Status: AC
  Administered 2023-05-22: 10 mg via INTRAVENOUS
  Filled 2023-05-22: qty 10

## 2023-05-22 MED ORDER — SODIUM CHLORIDE 0.9 % IV SOLN
15.0000 mg/kg | Freq: Once | INTRAVENOUS | Status: AC
  Administered 2023-05-22: 800 mg via INTRAVENOUS
  Filled 2023-05-22: qty 32

## 2023-05-22 MED ORDER — HEPARIN SOD (PORK) LOCK FLUSH 100 UNIT/ML IV SOLN: 500.0000 [IU] | Freq: Once | INTRAVENOUS | Status: DC | PRN

## 2023-05-22 MED ORDER — SODIUM CHLORIDE 0.9 % IV SOLN: Freq: Once | INTRAVENOUS | Status: AC

## 2023-05-22 MED ORDER — SODIUM CHLORIDE 0.9 % IV SOLN
500.0000 mg/m2 | Freq: Once | INTRAVENOUS | Status: AC
  Administered 2023-05-22: 800 mg via INTRAVENOUS
  Filled 2023-05-22: qty 20

## 2023-05-22 NOTE — Patient Instructions (Signed)
[facility]  Discharge Instructions: Thank you for choosing [facility] to provide your oncology and hematology care.   If you have a lab appointment with the Cancer Center, please go directly to the Cancer Center and check in at the registration area.   Wear comfortable clothing and clothing appropriate for easy access to any Portacath or PICC line.   We strive to give you quality time with your provider. You may need to reschedule your appointment if you arrive late (15 or more minutes).  Arriving late affects you and other patients whose appointments are after yours.  Also, if you miss three or more appointments without notifying the office, you may be dismissed from the clinic at the provider's discretion.      For prescription refill requests, have your pharmacy contact our office and allow 72 hours for refills to be completed.    Today you received the following chemotherapy and/or immunotherapy agents: Mvasi, Alimta      To help prevent nausea and vomiting after your treatment, we encourage you to take your nausea medication as directed.  BELOW ARE SYMPTOMS THAT SHOULD BE REPORTED IMMEDIATELY: *FEVER GREATER THAN 100.4 F (38 C) OR HIGHER *CHILLS OR SWEATING *NAUSEA AND VOMITING THAT IS NOT CONTROLLED WITH YOUR NAUSEA MEDICATION *UNUSUAL SHORTNESS OF BREATH *UNUSUAL BRUISING OR BLEEDING *URINARY PROBLEMS (pain or burning when urinating, or frequent urination) *BOWEL PROBLEMS (unusual diarrhea, constipation, pain near the anus) TENDERNESS IN MOUTH AND THROAT WITH OR WITHOUT PRESENCE OF ULCERS (sore throat, sores in mouth, or a toothache) UNUSUAL RASH, SWELLING OR PAIN  UNUSUAL VAGINAL DISCHARGE OR ITCHING   Items with * indicate a potential emergency and should be followed up as soon as possible or go to the Emergency Department if any problems should occur.  Please show the CHEMOTHERAPY ALERT CARD or IMMUNOTHERAPY ALERT CARD at  check-in to the Emergency Department and triage nurse.  Should you have questions after your visit or need to cancel or reschedule your appointment, please contact [facility]  Dept: [phone]  and follow the prompts.  Office hours are 8:00 a.m. to 4:30 p.m. Monday - Friday. Please note that voicemails left after 4:00 p.m. may not be returned until the following business day.  We are closed weekends and major holidays. You have access to a nurse at all times for urgent questions. Please call the main number to the clinic Dept: [phone] and follow the prompts.   For any non-urgent questions, you may also contact your provider using MyChart. We now offer e-Visits for anyone 18 and older to request care online for non-urgent symptoms. For details visit [email or web address].   Also download the MyChart app! Go to the app store, search "MyChart", open the app, select [facility], and log in with your MyChart username and password.   

## 2023-05-22 NOTE — Progress Notes (Signed)
[facility] [phone]   Fax:([phone]) [phone]  OFFICE PROGRESS NOTE  Kirsten Peak, PA-C [address] [place] [address]  DIAGNOSIS:  Stage IV (T4, N2, M1 a) non-small cell lung cancer, adenocarcinoma presented with multifocal disease involving the right upper lobe, right lower lobe as well as suspicious lower paratracheal lymphadenopathy and groundglass nodules in the left lung diagnosed in May 2022. The patient had molecular studies performed by [facility] and that showed no detectable mutation but this is likely secondary to low-level circulating free tumor DNA.  PRIOR THERAPY: None.  CURRENT THERAPY: palliative systemic chemotherapy with carboplatin for AUC of 5, Alimta 500 Mg/M2 and Avastin 15 Mg/KG every 3 weeks.  The patient is not a great candidate for immunotherapy because of her history of active systemic lupus erythematosus.  Starting from cycle #7 the patient is on maintenance treatment with Alimta and Avastin every 3 weeks.  She is status post 33 cycles.  INTERVAL HISTORY: Kirsten Williams 52 y.o. female returns to the clinic today for follow-up visit.  The patient is feeling fine today with no concerning complaints.  She denied having any current chest pain, shortness of breath, cough or hemoptysis.  She has no nausea, vomiting, diarrhea or constipation.  She has no headache or visual changes.  She denied having any recent weight loss or night sweats.  She was seen by neurology recently for evaluation of neuropathy and she was felt to have degenerative disc disease.  The patient is here today for evaluation before starting cycle #34 of her treatment.   MEDICAL HISTORY: Past Medical History:  Diagnosis Date   Adenocarcinoma, lung, right (HCC) 05/03/2021   Kirsten Williams is a 52 y.o. female with a history of lung nodules dating back to 2019 who is referred in consultation with Kirsten Peak, PA-C for assessment and management. She is a  former smoker having quit in 2016. 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 She missed her screening CT last year and imaging was ordered last month. CT chest from 02-16-2021 reveals pro   Anxiety    GERD (gastroesophageal reflux disease)    SLE (systemic lupus erythematosus) (HCC) 05/03/2021   SLE (systemic lupus erythematosus) (HCC) 05/03/2021    ALLERGIES:  is allergic to clindamycin/lincomycin and carboplatin.  MEDICATIONS:  Current Outpatient Medications  Medication Sig Dispense Refill   amLODipine (NORVASC) 5 MG tablet Take 5 mg by mouth daily.     B Complex Vitamins (B COMPLEX 50 PO) Take by mouth.     Calcium Carbonate-Vit D-Min (CALCIUM 1200 PO) Take 1 capsule by mouth daily.     cholecalciferol (VITAMIN D3) 25 MCG (1000 UNIT) tablet Take 2,000 Units by mouth daily.     cyclobenzaprine (FLEXERIL) 10 MG tablet Take 10 mg by mouth at bedtime as needed for muscle spasms.     denosumab (PROLIA) 60 MG/ML SOSY injection Inject 60 mg into the skin every 6 (six) months.     dexamethasone (DECADRON) 4 MG tablet 4 mg p.o. twice daily the day before, day of and day after the chemotherapy every 3 weeks. 40 tablet 2   dicyclomine (BENTYL) 10 MG capsule Take 10 mg by mouth every 6 (six) hours as needed.     diphenhydrAMINE (BENADRYL) 25 MG tablet Take 25 mg by mouth daily as needed for allergies.     famotidine (PEPCID) 20 MG tablet Take 20 mg by mouth at bedtime.  fluticasone (FLONASE) 50 MCG/ACT nasal spray Place 2 sprays into both nostrils daily.     folic acid (FOLVITE) 1 MG tablet TAKE 1 TABLET(1 MG) BY MOUTH DAILY 30 tablet 4   gabapentin (NEURONTIN) 300 MG capsule Take 300 mg by mouth 3 (three) times daily.     hydrocortisone 1 % lotion Apply 1 application topically 2 (two) times daily. 118 mL 0   hydroxychloroquine (PLAQUENIL) 200 MG tablet Take 200 mg by mouth 2 (two) times daily.     Multiple Vitamins-Minerals (MULTI ADULT GUMMIES) CHEW Chew 2 tablets by  mouth daily.     omeprazole (PRILOSEC) 40 MG capsule Take 40 mg by mouth daily as needed (acid reflux).     oxyCODONE-acetaminophen (PERCOCET/ROXICET) 5-325 MG tablet Take 1 tablet by mouth every 8 (eight) hours as needed for severe pain. 30 tablet 0   potassium chloride SA (KLOR-CON M) 20 MEQ tablet Take 1 tablet (20 mEq total) by mouth daily. 5 tablet 0   Probiotic Product (PROBIOTIC-10 PO) Take by mouth.     prochlorperazine (COMPAZINE) 10 MG tablet TAKE 1 TABLET(10 MG) BY MOUTH EVERY 6 HOURS AS NEEDED 30 tablet 2   rizatriptan (MAXALT-MLT) 10 MG disintegrating tablet Take 10 mg by mouth 2 (two) times daily as needed.     tetrahydrozoline 0.05 % ophthalmic solution Place 1 drop into both eyes once. Systane Eye Drops once daily both eyes     triamcinolone ointment (KENALOG) 0.5 % Apply topically 3 (three) times daily.     No current facility-administered medications for this visit.    SURGICAL HISTORY:  Past Surgical History:  Procedure Laterality Date   ABDOMINAL HYSTERECTOMY     BRONCHIAL BIOPSY  04/24/2021   Procedure: BRONCHIAL BIOPSIES;  Surgeon: Leslye Peer, MD;  Location: [facility] ENDOSCOPY;  Service: Pulmonary;;   BRONCHIAL BRUSHINGS  04/24/2021   Procedure: BRONCHIAL BRUSHINGS;  Surgeon: Leslye Peer, MD;  Location: [facility] ENDOSCOPY;  Service: Pulmonary;;   BRONCHIAL NEEDLE ASPIRATION BIOPSY  04/24/2021   Procedure: BRONCHIAL NEEDLE ASPIRATION BIOPSIES;  Surgeon: Leslye Peer, MD;  Location: [facility] ENDOSCOPY;  Service: Pulmonary;;   BRONCHIAL WASHINGS  04/24/2021   Procedure: BRONCHIAL WASHINGS;  Surgeon: Leslye Peer, MD;  Location[facility] ENDOSCOPY;  Service: Pulmonary;;   CESAREAN SECTION  11/09/1990   DILATION AND CURETTAGE OF UTERUS Bilateral 09/09/1996   FIDUCIAL MARKER PLACEMENT  04/24/2021   Procedure: FIDUCIAL MARKER PLACEMENT;  Surgeon: Leslye Peer, MD;  Location[facility] ENDOSCOPY;  Service: Pulmonary;;   TONSILLECTOMY  12/10/1977   VIDEO BRONCHOSCOPY WITH ENDOBRONCHIAL  NAVIGATION Bilateral 04/24/2021   Procedure: VIDEO BRONCHOSCOPY WITH ENDOBRONCHIAL NAVIGATION;  Surgeon: Leslye Peer, MD;  Location: [facility] ENDOSCOPY;  Service: Pulmonary;  Laterality: Bilateral;    REVIEW OF SYSTEMS:  A comprehensive review of systems was negative.   PHYSICAL EXAMINATION: General appearance: alert, cooperative, and no distress Head: Normocephalic, without obvious abnormality, atraumatic Neck: no adenopathy, no JVD, supple, symmetrical, trachea midline, and thyroid not enlarged, symmetric, no tenderness/mass/nodules Lymph nodes: Cervical, supraclavicular, and axillary nodes normal. Resp: clear to auscultation bilaterally Back: symmetric, no curvature. ROM normal. No CVA tenderness. Cardio: regular rate and rhythm, S1, S2 normal, no murmur, click, rub or gallop GI: soft, non-tender; bowel sounds normal; no masses,  no organomegaly Extremities: extremities normal, atraumatic, no cyanosis or edema  ECOG PERFORMANCE STATUS: 1 - Symptomatic but completely ambulatory  Blood pressure (!) 140/81, pulse 80, temperature 97.6 F (36.4 C), resp. rate 16, weight 111 lb 3.2 oz (50.4  kg), SpO2 100 %.  LABORATORY DATA: Lab Results  Component Value Date   WBC 8.8 05/22/2023   HGB 12.8 05/22/2023   HCT 40.8 05/22/2023   MCV 99.3 05/22/2023   PLT 298 05/22/2023      Chemistry      Component Value Date/Time   NA 138 05/01/2023 1006   NA 140 03/13/2021 0000   K 3.8 05/01/2023 1006   CL 103 05/01/2023 1006   CO2 25 05/01/2023 1006   BUN 9 05/01/2023 1006   BUN 10 03/13/2021 0000   CREATININE 0.85 05/01/2023 1006   GLU 107 03/13/2021 0000      Component Value Date/Time   CALCIUM 9.9 05/01/2023 1006   ALKPHOS 116 05/01/2023 1006   AST 24 05/01/2023 1006   ALT 17 05/01/2023 1006   BILITOT 0.3 05/01/2023 1006       RADIOGRAPHIC STUDIES: CT CHEST ABDOMEN PELVIS W CONTRAST  Result Date: 04/30/2023 CLINICAL DATA:  Restaging non-small cell lung cancer. * Tracking Code: BO *  EXAM: CT CHEST, ABDOMEN, AND PELVIS WITH CONTRAST TECHNIQUE: Multidetector CT imaging of the chest, abdomen and pelvis was performed following the standard protocol during bolus administration of intravenous contrast. RADIATION DOSE REDUCTION: This exam was performed according to the departmental dose-optimization program which includes automated exposure control, adjustment of the mA and/or kV according to patient size and/or use of iterative reconstruction technique. CONTRAST:  OMNIPAQUE IOHEXOL 300 MG/ML  SOLN COMPARISON:  01/30/2023 FINDINGS: CT CHEST FINDINGS Cardiovascular: Normal heart size. Aortic atherosclerosis. No pericardial effusion. Mediastinum/Nodes: Thyroid gland, trachea and esophagus are unremarkable. Right paratracheal lymph node measures 1.1 cm, image 23/2. Unchanged from previous exam. No enlarged hilar or axillary lymph nodes. Lungs/Pleura: Mild emphysema. No pleural effusion. No airspace consolidation or pneumothorax. -part solid nodule within the superior segment of the right lower lobe measures 2.2 x 1.5 cm, image 65/4. On the previous exam this measured 2.1 x 1.3 cm (when remeasured.) -subsolid nodule in the posterior right upper lobe measures 1.4 cm, image 37/4. Previously 1.3 cm. -non solid nodule within the periphery of the left upper lobe measures 2.1 cm, image 42/2. Unchanged from previous exam. -additional non solid nodules within the left upper lobe are unchanged from the previous exam -Stable 3 mm nodule in the periphery of the right upper lobe, image 55/4. -3 mm solid nodule in the periphery of the right upper lobe is stable, image 51/4. Musculoskeletal: No chest wall mass or suspicious bone lesions identified. CT ABDOMEN PELVIS FINDINGS Hepatobiliary: No focal liver abnormality is seen. No gallstones, gallbladder wall thickening, or biliary dilatation. Pancreas: Unremarkable. No pancreatic ductal dilatation or surrounding inflammatory changes. Spleen: Normal in size without  focal abnormality. Adrenals/Urinary Tract: Bilateral adrenal adenomas appear unchanged. No follow-up imaging recommended. No nephrolithiasis, hydronephrosis or suspicious kidney mass. Urinary bladder appears normal. Stomach/Bowel: Stomach is within normal limits. Appendix appears normal. No evidence of bowel wall thickening, distention, or inflammatory changes. Vascular/Lymphatic: Aortic atherosclerosis. No aneurysm. No signs of abdominopelvic adenopathy. Reproductive: Status post hysterectomy. No adnexal masses. Other: No ascites or focal fluid collections. Musculoskeletal: No acute or significant osseous findings. IMPRESSION: 1. There is been no significant change in the appearance of scattered non-solid, sub solid and solid nodules. The scattered non solid and sub solid nodules remain suspicious for multifocal pulmonary adenocarcinoma. 2. No signs of nodal metastasis or metastatic disease to the abdomen or pelvis. 3. Stable appearance of bilateral adrenal adenomas. No follow-up imaging recommended. 4. Aortic Atherosclerosis (ICD10-I70.0) and Emphysema (ICD10-J43.9).  Electronically Signed   By: Signa Kell M.D.   On: 04/30/2023 13:22    ASSESSMENT AND PLAN:  This is a very pleasant 51 years old white female recently diagnosed with a stage IV non-small cell lung cancer, adenocarcinoma with no actionable mutation diagnosed in May 2022.  The patient has also history of systemic lupus erythematosus and she is not a candidate for immunotherapy. She is currently undergoing systemic chemotherapy with carboplatin for AUC of 5, Alimta 500 Mg/M2 and Avastin 15 Mg/KG every 3 weeks status post 33 cycles.  Starting from cycle #7 the patient is on maintenance treatment with Alimta and Avastin every 3 weeks. 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 The patient has been tolerating this treatment well with no concerning adverse effects. I recommended for her to proceed with cycle  #34 today as planned. I will see her back for follow-up visit in 3 weeks for evaluation before the next cycle of her treatment. She was advised to call immediately if she has any other concerning symptoms in the interval. The patient voices understanding of current disease status and treatment options and is in agreement with the current care plan.  All questions were answered. The patient knows to call the clinic with any problems, questions or concerns. We can certainly see the patient much sooner if necessary. The total time spent in the appointment was 20 minutes.  Disclaimer: This note was dictated with voice recognition software. Similar sounding words can inadvertently be transcribed and may not be corrected upon review.

## 2023-05-31 ENCOUNTER — Telehealth: Payer: Self-pay | Admitting: Neurology

## 2023-05-31 NOTE — Telephone Encounter (Signed)
Called [facility] and was instructed to send patient to location below:  [facility] in [place] [address] [place], [place] [address] Phone: [phone] Fax: [phone]  Called patient and left a detailed message per DPR that I have resent her physical therapy referral and provided her with the office phone number to contact their office for an appt. Left our contact information incase she had any questions or concerns.

## 2023-05-31 NOTE — Telephone Encounter (Signed)
Patient states that [facility] has not called to set up her physical therapy. Kirsten Williams

## 2023-06-04 ENCOUNTER — Other Ambulatory Visit: Payer: Self-pay | Admitting: Physician Assistant

## 2023-06-11 MED FILL — Dexamethasone Sodium Phosphate Inj 100 MG/10ML: INTRAMUSCULAR | Qty: 1 | Status: AC

## 2023-06-12 ENCOUNTER — Inpatient Hospital Stay: Payer: Commercial Managed Care - PPO | Attending: Hematology and Oncology

## 2023-06-12 ENCOUNTER — Encounter: Payer: Self-pay | Admitting: Medical Oncology

## 2023-06-12 ENCOUNTER — Inpatient Hospital Stay (HOSPITAL_BASED_OUTPATIENT_CLINIC_OR_DEPARTMENT_OTHER): Payer: Commercial Managed Care - PPO | Admitting: Internal Medicine

## 2023-06-12 ENCOUNTER — Other Ambulatory Visit: Payer: Self-pay

## 2023-06-12 ENCOUNTER — Inpatient Hospital Stay: Payer: Commercial Managed Care - PPO

## 2023-06-12 VITALS — BP 172/97 | HR 97

## 2023-06-12 DIAGNOSIS — C3431 Malignant neoplasm of lower lobe, right bronchus or lung: Secondary | ICD-10-CM | POA: Insufficient documentation

## 2023-06-12 DIAGNOSIS — C3411 Malignant neoplasm of upper lobe, right bronchus or lung: Secondary | ICD-10-CM | POA: Diagnosis present

## 2023-06-12 DIAGNOSIS — C3491 Malignant neoplasm of unspecified part of right bronchus or lung: Secondary | ICD-10-CM

## 2023-06-12 DIAGNOSIS — Z5111 Encounter for antineoplastic chemotherapy: Secondary | ICD-10-CM | POA: Insufficient documentation

## 2023-06-12 DIAGNOSIS — Z5112 Encounter for antineoplastic immunotherapy: Secondary | ICD-10-CM | POA: Insufficient documentation

## 2023-06-12 LAB — CMP (CANCER CENTER ONLY)
ALT: 18 U/L (ref 0–44)
AST: 24 U/L (ref 15–41)
Albumin: 3.8 g/dL (ref 3.5–5.0)
Alkaline Phosphatase: 119 U/L (ref 38–126)
Anion gap: 12 (ref 5–15)
BUN: 6 mg/dL (ref 6–20)
CO2: 22 mmol/L (ref 22–32)
Calcium: 9.9 mg/dL (ref 8.9–10.3)
Chloride: 105 mmol/L (ref 98–111)
Creatinine: 0.76 mg/dL (ref 0.44–1.00)
GFR, Estimated: >60 mL/min (ref >60)
Glucose, Bld: 144 mg/dL — ABNORMAL HIGH (ref 70–99)
Potassium: 3.3 mmol/L — ABNORMAL LOW (ref 3.5–5.1)
Sodium: 139 mmol/L (ref 135–145)
Total Bilirubin: 0.3 mg/dL (ref 0.3–1.2)
Total Protein: 7.3 g/dL (ref 6.5–8.1)

## 2023-06-12 LAB — CBC WITH DIFFERENTIAL (CANCER CENTER ONLY)
Abs Immature Granulocytes: 0.04 10*3/uL (ref 0.00–0.07)
Basophils Absolute: 0 10*3/uL (ref 0.0–0.1)
Basophils Relative: 0 %
Eosinophils Absolute: 0.1 10*3/uL (ref 0.0–0.5)
Eosinophils Relative: 1 %
HCT: 37.8 % (ref 36.0–46.0)
Hemoglobin: 12.2 g/dL (ref 12.0–15.0)
Immature Granulocytes: 0 %
Lymphocytes Relative: 9 %
Lymphs Abs: 0.8 10*3/uL (ref 0.7–4.0)
MCH: 31.2 pg (ref 26.0–34.0)
MCHC: 32.3 g/dL (ref 30.0–36.0)
MCV: 96.7 fL (ref 80.0–100.0)
Monocytes Absolute: 0.3 10*3/uL (ref 0.1–1.0)
Monocytes Relative: 4 %
Neutro Abs: 7.8 10*3/uL — ABNORMAL HIGH (ref 1.7–7.7)
Neutrophils Relative %: 86 %
Platelet Count: 254 10*3/uL (ref 150–400)
RBC: 3.91 MIL/uL (ref 3.87–5.11)
RDW: 16.9 % — ABNORMAL HIGH (ref 11.5–15.5)
WBC Count: 9.1 10*3/uL (ref 4.0–10.5)
nRBC: 0 % (ref 0.0–0.2)

## 2023-06-12 LAB — TOTAL PROTEIN, URINE DIPSTICK: Protein, ur: 100 mg/dL — AB

## 2023-06-12 MED ORDER — SODIUM CHLORIDE 0.9 % IV SOLN
500.0000 mg/m2 | Freq: Once | INTRAVENOUS | Status: AC
  Administered 2023-06-12: 800 mg via INTRAVENOUS
  Filled 2023-06-12: qty 20

## 2023-06-12 MED ORDER — CYANOCOBALAMIN 1000 MCG/ML IJ SOLN
1000.0000 ug | Freq: Once | INTRAMUSCULAR | Status: AC
  Administered 2023-06-12: 1000 ug via INTRAMUSCULAR
  Filled 2023-06-12: qty 1

## 2023-06-12 MED ORDER — SODIUM CHLORIDE 0.9 % IV SOLN
15.0000 mg/kg | Freq: Once | INTRAVENOUS | Status: AC
  Administered 2023-06-12: 800 mg via INTRAVENOUS
  Filled 2023-06-12: qty 32

## 2023-06-12 MED ORDER — PROCHLORPERAZINE MALEATE 10 MG PO TABS
10.0000 mg | ORAL_TABLET | Freq: Once | ORAL | Status: AC
  Administered 2023-06-12: 10 mg via ORAL
  Filled 2023-06-12: qty 1

## 2023-06-12 MED ORDER — SODIUM CHLORIDE 0.9 % IV SOLN
10.0000 mg | Freq: Once | INTRAVENOUS | Status: AC
  Administered 2023-06-12: 10 mg via INTRAVENOUS
  Filled 2023-06-12: qty 10

## 2023-06-12 MED ORDER — SODIUM CHLORIDE 0.9 % IV SOLN: Freq: Once | INTRAVENOUS | Status: AC

## 2023-06-12 NOTE — Patient Instructions (Signed)
[place] [facility]  Discharge Instructions: Thank you for choosing [facility] to provide your oncology and hematology care.   If you have a lab appointment with the Cancer Center, please go directly to the Cancer Center and check in at the registration area.   Wear comfortable clothing and clothing appropriate for easy access to any Portacath or PICC line.   We strive to give you quality time with your provider. You may need to reschedule your appointment if you arrive late (15 or more minutes).  Arriving late affects you and other patients whose appointments are after yours.  Also, if you miss three or more appointments without notifying the office, you may be dismissed from the clinic at the provider's discretion.      For prescription refill requests, have your pharmacy contact our office and allow 72 hours for refills to be completed.    Today you received the following chemotherapy and/or immunotherapy agents: Bevacizumab, Pemetrexed      To help prevent nausea and vomiting after your treatment, we encourage you to take your nausea medication as directed.  BELOW ARE SYMPTOMS THAT SHOULD BE REPORTED IMMEDIATELY: *FEVER GREATER THAN 100.4 F (38 C) OR HIGHER *CHILLS OR SWEATING *NAUSEA AND VOMITING THAT IS NOT CONTROLLED WITH YOUR NAUSEA MEDICATION *UNUSUAL SHORTNESS OF BREATH *UNUSUAL BRUISING OR BLEEDING *URINARY PROBLEMS (pain or burning when urinating, or frequent urination) *BOWEL PROBLEMS (unusual diarrhea, constipation, pain near the anus) TENDERNESS IN MOUTH AND THROAT WITH OR WITHOUT PRESENCE OF ULCERS (sore throat, sores in mouth, or a toothache) UNUSUAL RASH, SWELLING OR PAIN  UNUSUAL VAGINAL DISCHARGE OR ITCHING   Items with * indicate a potential emergency and should be followed up as soon as possible or go to the Emergency Department if any problems should occur.  Please show the CHEMOTHERAPY ALERT CARD or IMMUNOTHERAPY ALERT  CARD at check-in to the Emergency Department and triage nurse.  Should you have questions after your visit or need to cancel or reschedule your appointment, please contact [place] [facility]  Dept: [phone]  and follow the prompts.  Office hours are 8:00 a.m. to 4:30 p.m. Monday - Friday. Please note that voicemails left after 4:00 p.m. may not be returned until the following business day.  We are closed weekends and major holidays. You have access to a nurse at all times for urgent questions. Please call the main number to the clinic Dept: [phone] and follow the prompts.   For any non-urgent questions, you may also contact your provider using MyChart. We now offer e-Visits for anyone 28 and older to request care online for non-urgent symptoms. For details visit [email or web address].   Also download the MyChart app! Go to the app store, search "MyChart", open the app, select [place], and log in with your MyChart username and password.

## 2023-06-12 NOTE — Progress Notes (Signed)
Patient seen by Dr. Gypsy Balsam are not within treatment parameters.Per Dr. Vennie Homans d, it is ok to treat pt today with  Pemetrexed and bevacizumab-awwb and BP  =168/82  Labs reviewed: are NOT within treatment parameters.Per Dr. Arbutus Ped ,it is ok to treat pt today with Pemetrexed and bevacizumab-awwb  Per physician team, patient is ready for treatment and there are NO modifications to the treatment plan.

## 2023-06-12 NOTE — Progress Notes (Signed)
[facility] [phone]   [phone]  OFFICE PROGRESS NOTE  [name], PA-C [address] [place] [address]  DIAGNOSIS:  Stage IV (T4, N2, M1 a) non-small cell lung cancer, adenocarcinoma presented with multifocal disease involving the right upper lobe, right lower lobe as well as suspicious lower paratracheal lymphadenopathy and groundglass nodules in the left lung diagnosed in May 2022. The patient had molecular studies performed by [facility] [employer] and that showed no detectable mutation but this is likely secondary to low-level circulating free tumor DNA.  PRIOR THERAPY: None.  CURRENT THERAPY: palliative systemic chemotherapy with carboplatin for AUC of 5, Alimta 500 Mg/M2 and Avastin 15 Mg/KG every 3 weeks.  The patient is not a great candidate for immunotherapy because of her history of active systemic lupus erythematosus.  Starting from cycle #7 the patient is on maintenance treatment with Alimta and Avastin every 3 weeks.  She is status post 34 cycles.  INTERVAL HISTORY: Kirsten Williams 52 y.o. female returns to the clinic today for follow-up visit.  The patient is feeling fine today with no concerning complaints.  She denied having any fatigue or weakness.  She has no nausea, vomiting, diarrhea or constipation.  She has no headache or visual changes.  She denied having any recent weight loss or night sweats.  The patient is here today for evaluation before starting cycle #35 of her treatment.  MEDICAL HISTORY: Past Medical History:  Diagnosis Date   Adenocarcinoma, lung, right (HCC) 05/03/2021   Kirsten Williams is a 52 y.o. female with a history of lung nodules dating back to 2019 who is referred in consultation with [name], PA-C for assessment and management. She is a former smoker having quit in 2016. 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 She missed her screening CT last year and imaging was  ordered last month. CT chest from 02-16-2021 reveals pro   Anxiety    GERD (gastroesophageal reflux disease)    SLE (systemic lupus erythematosus) (HCC) 05/03/2021   SLE (systemic lupus erythematosus) (HCC) 05/03/2021    ALLERGIES:  is allergic to clindamycin/lincomycin and carboplatin.  MEDICATIONS:  Current Outpatient Medications  Medication Sig Dispense Refill   amLODipine (NORVASC) 5 MG tablet Take 5 mg by mouth daily.     B Complex Vitamins (B COMPLEX 50 PO) Take by mouth.     Calcium Carbonate-Vit D-Min (CALCIUM 1200 PO) Take 1 capsule by mouth daily.     cholecalciferol (VITAMIN D3) 25 MCG (1000 UNIT) tablet Take 2,000 Units by mouth daily.     cyclobenzaprine (FLEXERIL) 10 MG tablet Take 10 mg by mouth at bedtime as needed for muscle spasms.     denosumab (PROLIA) 60 MG/ML SOSY injection Inject 60 mg into the skin every 6 (six) months.     dexamethasone (DECADRON) 4 MG tablet 4 mg p.o. twice daily the day before, day of and day after the chemotherapy every 3 weeks. 40 tablet 2   dicyclomine (BENTYL) 10 MG capsule Take 10 mg by mouth every 6 (six) hours as needed.     diphenhydrAMINE (BENADRYL) 25 MG tablet Take 25 mg by mouth daily as needed for allergies.     famotidine (PEPCID) 20 MG tablet Take 20 mg by mouth at bedtime.     fluticasone (FLONASE) 50 MCG/ACT nasal spray Place 2 sprays into both nostrils daily.     folic acid (FOLVITE) 1 MG tablet TAKE  1 TABLET(1 MG) BY MOUTH DAILY 30 tablet 4   gabapentin (NEURONTIN) 300 MG capsule Take 300 mg by mouth 3 (three) times daily.     hydrocortisone 1 % lotion Apply 1 application topically 2 (two) times daily. 118 mL 0   hydroxychloroquine (PLAQUENIL) 200 MG tablet Take 200 mg by mouth 2 (two) times daily.     Multiple Vitamins-Minerals (MULTI ADULT GUMMIES) CHEW Chew 2 tablets by mouth daily.     omeprazole (PRILOSEC) 40 MG capsule Take 40 mg by mouth daily as needed (acid reflux).     oxyCODONE-acetaminophen (PERCOCET/ROXICET) 5-325  MG tablet Take 1 tablet by mouth every 8 (eight) hours as needed for severe pain. 30 tablet 0   potassium chloride SA (KLOR-CON M) 20 MEQ tablet Take 1 tablet (20 mEq total) by mouth daily. 5 tablet 0   Probiotic Product (PROBIOTIC-10 PO) Take by mouth.     prochlorperazine (COMPAZINE) 10 MG tablet TAKE 1 TABLET(10 MG) BY MOUTH EVERY 6 HOURS AS NEEDED 30 tablet 2   rizatriptan (MAXALT-MLT) 10 MG disintegrating tablet Take 10 mg by mouth 2 (two) times daily as needed.     tetrahydrozoline 0.05 % ophthalmic solution Place 1 drop into both eyes once. Systane Eye Drops once daily both eyes     triamcinolone ointment (KENALOG) 0.5 % Apply topically 3 (three) times daily.     No current facility-administered medications for this visit.    SURGICAL HISTORY:  Past Surgical History:  Procedure Laterality Date   ABDOMINAL HYSTERECTOMY     BRONCHIAL BIOPSY  04/24/2021   Procedure: BRONCHIAL BIOPSIES;  Surgeon: Leslye Peer, MD;  Location: [facility] ENDOSCOPY;  Service: Pulmonary;;   BRONCHIAL BRUSHINGS  04/24/2021   Procedure: BRONCHIAL BRUSHINGS;  Surgeon: Leslye Peer, MD;  Location: [facility] ENDOSCOPY;  Service: Pulmonary;;   BRONCHIAL NEEDLE ASPIRATION BIOPSY  04/24/2021   Procedure: BRONCHIAL NEEDLE ASPIRATION BIOPSIES;  Surgeon: Leslye Peer, MD;  Location: [facility] ENDOSCOPY;  Service: Pulmonary;;   BRONCHIAL WASHINGS  04/24/2021   Procedure: BRONCHIAL WASHINGS;  Surgeon: Leslye Peer, MD;  [facility] ENDOSCOPY;  Service: Pulmonary;;   CESAREAN SECTION  11/09/1990   DILATION AND CURETTAGE OF UTERUS Bilateral 09/09/1996   FIDUCIAL MARKER PLACEMENT  04/24/2021   Procedure: FIDUCIAL MARKER PLACEMENT;  Surgeon: Leslye Peer, MD;  Location: [facility] ENDOSCOPY;  Service: Pulmonary;;   TONSILLECTOMY  12/10/1977   VIDEO BRONCHOSCOPY WITH ENDOBRONCHIAL NAVIGATION Bilateral 04/24/2021   Procedure: VIDEO BRONCHOSCOPY WITH ENDOBRONCHIAL NAVIGATION;  Surgeon: Leslye Peer, MD;  Location: [facility] ENDOSCOPY;  Service:  Pulmonary;  Laterality: Bilateral;    REVIEW OF SYSTEMS:  A comprehensive review of systems was negative.   PHYSICAL EXAMINATION: General appearance: alert, cooperative, and no distress Head: Normocephalic, without obvious abnormality, atraumatic Neck: no adenopathy, no JVD, supple, symmetrical, trachea midline, and thyroid not enlarged, symmetric, no tenderness/mass/nodules Lymph nodes: Cervical, supraclavicular, and axillary nodes normal. Resp: clear to auscultation bilaterally Back: symmetric, no curvature. ROM normal. No CVA tenderness. Cardio: regular rate and rhythm, S1, S2 normal, no murmur, click, rub or gallop GI: soft, non-tender; bowel sounds normal; no masses,  no organomegaly Extremities: extremities normal, atraumatic, no cyanosis or edema  ECOG PERFORMANCE STATUS: 1 - Symptomatic but completely ambulatory  Blood pressure (!) 168/82, pulse 88, temperature 97.9 F (36.6 C), temperature source Oral, resp. rate 18, height 5\' 1"  (1.549 m), weight 112 lb 8 oz (51 kg), SpO2 100 %.  LABORATORY DATA: Lab Results  Component Value Date   WBC  8.8 05/22/2023   HGB 12.8 05/22/2023   HCT 40.8 05/22/2023   MCV 99.3 05/22/2023   PLT 298 05/22/2023      Chemistry      Component Value Date/Time   NA 141 05/22/2023 0907   NA 140 03/13/2021 0000   K 4.1 05/22/2023 0907   CL 105 05/22/2023 0907   CO2 28 05/22/2023 0907   BUN 5 (L) 05/22/2023 0907   BUN 10 03/13/2021 0000   CREATININE 0.68 05/22/2023 0907   GLU 107 03/13/2021 0000      Component Value Date/Time   CALCIUM 9.9 05/22/2023 0907   ALKPHOS 119 05/22/2023 0907   AST 21 05/22/2023 0907   ALT 13 05/22/2023 0907   BILITOT 0.3 05/22/2023 0907       RADIOGRAPHIC STUDIES: No results found.  ASSESSMENT AND PLAN:  This is a very pleasant 52 years old white female recently diagnosed with a stage IV non-small cell lung cancer, adenocarcinoma with no actionable mutation diagnosed in May 2022.  The patient has also  history of systemic lupus erythematosus and she is not a candidate for immunotherapy. She is currently undergoing systemic chemotherapy with carboplatin for AUC of 5, Alimta 500 Mg/M2 and Avastin 15 Mg/KG every 3 weeks status post 34 cycles.  Starting from cycle #7 the patient is on maintenance treatment with Alimta and Avastin every 3 weeks. 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 The patient continues to tolerate her treatment with maintenance Alimta and Avastin fairly well. I recommended for her to proceed with cycle #35 today as planned.  Her urine protein is 100 but still acceptable for treatment today.  Will continue to monitor. I will see her back for follow-up visit in 3 weeks for evaluation before the next cycle of her treatment. She was advised to call immediately if she has any other concerning symptoms in the interval. The patient voices understanding of current disease status and treatment options and is in agreement with the current care plan.  All questions were answered. The patient knows to call the clinic with any problems, questions or concerns. We can certainly see the patient much sooner if necessary. The total time spent in the appointment was 20 minutes.  Disclaimer: This note was dictated with voice recognition software. Similar sounding words can inadvertently be transcribed and may not be corrected upon review.

## 2023-06-30 NOTE — Progress Notes (Unsigned)
[facility] OFFICE PROGRESS NOTE  Kirsten Peak, PA-C [address] [place] [address]  DIAGNOSIS: Stage IV (T4, N2, M1 a) non-small cell lung cancer, adenocarcinoma presented with multifocal disease involving the right upper lobe, right lower lobe as well as suspicious lower paratracheal lymphadenopathy and groundglass nodules in the left lung diagnosed in May 2022. The patient had molecular studies performed by [facility] [employer] and that showed no detectable mutation but this is likely secondary to low-level circulating free tumor DNA.    PRIOR THERAPY: None  CURRENT THERAPY: Palliative systemic chemotherapy with carboplatin for AUC of 5, Alimta 500 Mg/M2 and Avastin 15 Mg/KG every 3 weeks.  The patient is not a great candidate for immunotherapy in the event that she has a genetic mutation.  She is status post 35 cycles. Starting from cycle #6, the patient will start maintenance Alimta and Avastin.    INTERVAL HISTORY: Kirsten Williams 52 y.o. female returns to the clinic today for a follow-up visit. The patient is feeling fairly well today without any concerning complaints. She is tolerating her treatment fairly well except  ***. She denies any fever or chills.  She has occasional night sweats which is not new for her she has these intermittently. weight loss or appetite. Nausea, vomiting, diarrhea, or constipation.  She denies any significant shortness of breath unless with strenuous activity. Denies chest pain. She has intermittent cough but no significant cough recently.  Denies abdominal pain. getting prolia. She is here for evaluation and repeat blood work before undergoing cycle #36  MEDICAL HISTORY: Past Medical History:  Diagnosis Date   Adenocarcinoma, lung, right (HCC) 05/03/2021   Kirsten Williams is a 52 y.o. female with a history of lung nodules dating back to 2019 who is referred in consultation with Kirsten Peak, PA-C for assessment and management. She is a former  smoker having quit in 2016. 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 She missed her screening CT last year and imaging was ordered last month. CT chest from 02-16-2021 reveals pro   Anxiety    GERD (gastroesophageal reflux disease)    SLE (systemic lupus erythematosus) (HCC) 05/03/2021   SLE (systemic lupus erythematosus) (HCC) 05/03/2021    ALLERGIES:  is allergic to clindamycin/lincomycin and carboplatin.  MEDICATIONS:  Current Outpatient Medications  Medication Sig Dispense Refill   amLODipine (NORVASC) 5 MG tablet Take 5 mg by mouth daily.     B Complex Vitamins (B COMPLEX 50 PO) Take by mouth.     Calcium Carbonate-Vit D-Min (CALCIUM 1200 PO) Take 1 capsule by mouth daily.     cholecalciferol (VITAMIN D3) 25 MCG (1000 UNIT) tablet Take 2,000 Units by mouth daily.     cyclobenzaprine (FLEXERIL) 10 MG tablet Take 10 mg by mouth at bedtime as needed for muscle spasms.     denosumab (PROLIA) 60 MG/ML SOSY injection Inject 60 mg into the skin every 6 (six) months.     dexamethasone (DECADRON) 4 MG tablet 4 mg p.o. twice daily the day before, day of and day after the chemotherapy every 3 weeks. 40 tablet 2   dicyclomine (BENTYL) 10 MG capsule Take 10 mg by mouth every 6 (six) hours as needed.     diphenhydrAMINE (BENADRYL) 25 MG tablet Take 25 mg by mouth daily as needed for allergies.     famotidine (PEPCID) 20 MG tablet Take 20 mg by mouth at bedtime.     fluticasone (FLONASE) 50 MCG/ACT  nasal spray Place 2 sprays into both nostrils daily.     folic acid (FOLVITE) 1 MG tablet TAKE 1 TABLET(1 MG) BY MOUTH DAILY 30 tablet 4   gabapentin (NEURONTIN) 300 MG capsule Take 300 mg by mouth 3 (three) times daily.     hydrocortisone 1 % lotion Apply 1 application topically 2 (two) times daily. 118 mL 0   hydroxychloroquine (PLAQUENIL) 200 MG tablet Take 200 mg by mouth 2 (two) times daily.     Multiple Vitamins-Minerals (MULTI ADULT GUMMIES) CHEW Chew 2 tablets by mouth  daily.     omeprazole (PRILOSEC) 40 MG capsule Take 40 mg by mouth daily as needed (acid reflux).     oxyCODONE-acetaminophen (PERCOCET/ROXICET) 5-325 MG tablet Take 1 tablet by mouth every 8 (eight) hours as needed for severe pain. 30 tablet 0   potassium chloride SA (KLOR-CON M) 20 MEQ tablet Take 1 tablet (20 mEq total) by mouth daily. 5 tablet 0   Probiotic Product (PROBIOTIC-10 PO) Take by mouth.     prochlorperazine (COMPAZINE) 10 MG tablet TAKE 1 TABLET(10 MG) BY MOUTH EVERY 6 HOURS AS NEEDED 30 tablet 2   rizatriptan (MAXALT-MLT) 10 MG disintegrating tablet Take 10 mg by mouth 2 (two) times daily as needed.     tetrahydrozoline 0.05 % ophthalmic solution Place 1 drop into both eyes once. Systane Eye Drops once daily both eyes     triamcinolone ointment (KENALOG) 0.5 % Apply topically 3 (three) times daily.     No current facility-administered medications for this visit.    SURGICAL HISTORY:  Past Surgical History:  Procedure Laterality Date   ABDOMINAL HYSTERECTOMY     BRONCHIAL BIOPSY  04/24/2021   Procedure: BRONCHIAL BIOPSIES;  Surgeon: Leslye Peer, MD;  Location: [facility] ENDOSCOPY;  Service: Pulmonary;;   BRONCHIAL BRUSHINGS  04/24/2021   Procedure: BRONCHIAL BRUSHINGS;  Surgeon: Leslye Peer, MD;  Location[facility] ENDOSCOPY;  Service: Pulmonary;;   BRONCHIAL NEEDLE ASPIRATION BIOPSY  04/24/2021   Procedure: BRONCHIAL NEEDLE ASPIRATION BIOPSIES;  Surgeon: Leslye Peer, MD;  Location: [facility] ENDOSCOPY;  Service: Pulmonary;;   BRONCHIAL WASHINGS  04/24/2021   Procedure: BRONCHIAL WASHINGS;  Surgeon: Leslye Peer, MD;  Location: [facility] ENDOSCOPY;  Service: Pulmonary;;   CESAREAN SECTION  11/09/1990   DILATION AND CURETTAGE OF UTERUS Bilateral 09/09/1996   FIDUCIAL MARKER PLACEMENT  04/24/2021   Procedure: FIDUCIAL MARKER PLACEMENT;  Surgeon: Leslye Peer, MD;  Location: [facility] ENDOSCOPY;  Service: Pulmonary;;   TONSILLECTOMY  12/10/1977   VIDEO BRONCHOSCOPY WITH ENDOBRONCHIAL NAVIGATION  Bilateral 04/24/2021   Procedure: VIDEO BRONCHOSCOPY WITH ENDOBRONCHIAL NAVIGATION;  Surgeon: Leslye Peer, MD;  Location: [facility] ENDOSCOPY;  Service: Pulmonary;  Laterality: Bilateral;    REVIEW OF SYSTEMS:   Review of Systems  Constitutional: Negative for appetite change, chills, fatigue, fever and unexpected weight change.  HENT:   Negative for mouth sores, nosebleeds, sore throat and trouble swallowing.   Eyes: Negative for eye problems and icterus.  Respiratory: Negative for cough, hemoptysis, shortness of breath and wheezing.   Cardiovascular: Negative for chest pain and leg swelling.  Gastrointestinal: Negative for abdominal pain, constipation, diarrhea, nausea and vomiting.  Genitourinary: Negative for bladder incontinence, difficulty urinating, dysuria, frequency and hematuria.   Musculoskeletal: Negative for back pain, gait problem, neck pain and neck stiffness.  Skin: Negative for itching and rash.  Neurological: Negative for dizziness, extremity weakness, gait problem, headaches, light-headedness and seizures.  Hematological: Negative for adenopathy. Does not bruise/bleed easily.  Psychiatric/Behavioral: Negative  for confusion, depression and sleep disturbance. The patient is not nervous/anxious.     PHYSICAL EXAMINATION:  There were no vitals taken for this visit.  ECOG PERFORMANCE STATUS: {CHL ONC ECOG [id number]  Physical Exam  Constitutional: Oriented to person, place, and time and well-developed, well-nourished, and in no distress. No distress.  HENT:  Head: Normocephalic and atraumatic.  Mouth/Throat: Oropharynx is clear and moist. No oropharyngeal exudate.  Eyes: Conjunctivae are normal. Right eye exhibits no discharge. Left eye exhibits no discharge. No scleral icterus.  Neck: Normal range of motion. Neck supple.  Cardiovascular: Normal rate, regular rhythm, normal heart sounds and intact distal pulses.   Pulmonary/Chest: Effort normal and breath sounds normal.  No respiratory distress. No wheezes. No rales.  Abdominal: Soft. Bowel sounds are normal. Exhibits no distension and no mass. There is no tenderness.  Musculoskeletal: Normal range of motion. Exhibits no edema.  Lymphadenopathy:    No cervical adenopathy.  Neurological: Alert and oriented to person, place, and time. Exhibits normal muscle tone. Gait normal. Coordination normal.  Skin: Skin is warm and dry. No rash noted. Not diaphoretic. No erythema. No pallor.  Psychiatric: Mood, memory and judgment normal.  Vitals reviewed.  LABORATORY DATA: Lab Results  Component Value Date   WBC 9.1 06/12/2023   HGB 12.2 06/12/2023   HCT 37.8 06/12/2023   MCV 96.7 06/12/2023   PLT 254 06/12/2023      Chemistry      Component Value Date/Time   NA 139 06/12/2023 0920   NA 140 03/13/2021 0000   K 3.3 (L) 06/12/2023 0920   CL 105 06/12/2023 0920   CO2 22 06/12/2023 0920   BUN 6 06/12/2023 0920   BUN 10 03/13/2021 0000   CREATININE 0.76 06/12/2023 0920   GLU 107 03/13/2021 0000      Component Value Date/Time   CALCIUM 9.9 06/12/2023 0920   ALKPHOS 119 06/12/2023 0920   AST 24 06/12/2023 0920   ALT 18 06/12/2023 0920   BILITOT 0.3 06/12/2023 0920       RADIOGRAPHIC STUDIES:  No results found.   ASSESSMENT/PLAN:  This is a very pleasant 52 year old Caucasian female diagnosed with stage IV (T4, N2, M1 a) non-small cell lung cancer, adenocarcinoma presented with multifocal disease involving the right upper lobe, right lower lobe as well as suspicious lower paratracheal lymphadenopathy and groundglass nodules in the left lung diagnosed in May 2022. The patient had molecular studies performed by [facility] [employer] and that showed no detectable mutation but this is likely secondary to low-level circulating free tumor DNA. If the patient has disease progression in the future, then we will likely retest her for molecular studies.    The patient is currently undergoing systemic chemotherapy with  carboplatin for an AUC 5, Alimta 500 mg per metered square, and and Avastin 1500 mg/kg IV every 3 weeks.  She is status post 35 cycles.  Starting from cycle #7, the patient has been on maintenance treatment with Alimta and Avastin.  Labs were reviewed. Recommend she proceed with cycle #36 today as scheduled. She is ok to treat with the urine protein of ***  We will see her back for a follow up visit in 3 weeks for evaluation and repeat blood work before starting cycle #37.   ***order scan?   The patient was advised to call immediately if she has any concerning symptoms in the interval. The patient voices understanding of current disease status and treatment options and is in agreement with the current  care plan. All questions were answered. The patient knows to call the clinic with any problems, questions or concerns. We can certainly see the patient much sooner if necessary   No orders of the defined types were placed in this encounter.    I spent {CHL ONC TIME VISIT - ZDGUY:[id number]} counseling the patient face to face. The total time spent in the appointment was {CHL ONC TIME VISIT - GLOVF:[id number]}.  Loghan Subia L Cincere Deprey, PA-C 06/30/23

## 2023-07-02 MED FILL — Dexamethasone Sodium Phosphate Inj 100 MG/10ML: INTRAMUSCULAR | Qty: 1 | Status: AC

## 2023-07-03 ENCOUNTER — Inpatient Hospital Stay: Payer: Commercial Managed Care - PPO

## 2023-07-03 ENCOUNTER — Other Ambulatory Visit: Payer: Self-pay

## 2023-07-03 ENCOUNTER — Inpatient Hospital Stay (HOSPITAL_BASED_OUTPATIENT_CLINIC_OR_DEPARTMENT_OTHER): Payer: Commercial Managed Care - PPO | Admitting: Physician Assistant

## 2023-07-03 VITALS — BP 128/68 | HR 72 | Temp 98.2°F | Resp 18

## 2023-07-03 VITALS — BP 136/82 | HR 77 | Temp 97.5°F | Resp 20 | Wt 111.8 lb

## 2023-07-03 DIAGNOSIS — Z5111 Encounter for antineoplastic chemotherapy: Secondary | ICD-10-CM | POA: Diagnosis not present

## 2023-07-03 DIAGNOSIS — G893 Neoplasm related pain (acute) (chronic): Secondary | ICD-10-CM | POA: Diagnosis not present

## 2023-07-03 DIAGNOSIS — C3491 Malignant neoplasm of unspecified part of right bronchus or lung: Secondary | ICD-10-CM

## 2023-07-03 DIAGNOSIS — Z5112 Encounter for antineoplastic immunotherapy: Secondary | ICD-10-CM | POA: Diagnosis not present

## 2023-07-03 LAB — CBC WITH DIFFERENTIAL/PLATELET
Abs Immature Granulocytes: 0.02 10*3/uL (ref 0.00–0.07)
Basophils Absolute: 0.1 10*3/uL (ref 0.0–0.1)
Basophils Relative: 1 %
Eosinophils Absolute: 0 10*3/uL (ref 0.0–0.5)
Eosinophils Relative: 1 %
HCT: 41.3 % (ref 36.0–46.0)
Hemoglobin: 13.4 g/dL (ref 12.0–15.0)
Immature Granulocytes: 0 %
Lymphocytes Relative: 10 %
Lymphs Abs: 0.9 10*3/uL (ref 0.7–4.0)
MCH: 31.3 pg (ref 26.0–34.0)
MCHC: 32.4 g/dL (ref 30.0–36.0)
MCV: 96.5 fL (ref 80.0–100.0)
Monocytes Absolute: 0.3 10*3/uL (ref 0.1–1.0)
Monocytes Relative: 4 %
Neutro Abs: 7 10*3/uL (ref 1.7–7.7)
Neutrophils Relative %: 84 %
Platelets: 234 10*3/uL (ref 150–400)
RBC: 4.28 MIL/uL (ref 3.87–5.11)
RDW: 17.2 % — ABNORMAL HIGH (ref 11.5–15.5)
WBC: 8.3 10*3/uL (ref 4.0–10.5)
nRBC: 0 % (ref 0.0–0.2)

## 2023-07-03 LAB — COMPREHENSIVE METABOLIC PANEL
ALT: 13 U/L (ref 0–44)
AST: 24 U/L (ref 15–41)
Albumin: 4.2 g/dL (ref 3.5–5.0)
Alkaline Phosphatase: 111 U/L (ref 38–126)
Anion gap: 10 (ref 5–15)
BUN: 10 mg/dL (ref 6–20)
CO2: 21 mmol/L — ABNORMAL LOW (ref 22–32)
Calcium: 10 mg/dL (ref 8.9–10.3)
Chloride: 107 mmol/L (ref 98–111)
Creatinine, Ser: 0.87 mg/dL (ref 0.44–1.00)
GFR, Estimated: >60 mL/min (ref >60)
Glucose, Bld: 119 mg/dL — ABNORMAL HIGH (ref 70–99)
Potassium: 3.7 mmol/L (ref 3.5–5.1)
Sodium: 138 mmol/L (ref 135–145)
Total Bilirubin: 0.3 mg/dL (ref 0.3–1.2)
Total Protein: 7.6 g/dL (ref 6.5–8.1)

## 2023-07-03 LAB — TOTAL PROTEIN, URINE DIPSTICK: Protein, ur: 100 mg/dL — AB

## 2023-07-03 MED ORDER — SODIUM CHLORIDE 0.9 % IV SOLN
500.0000 mg/m2 | Freq: Once | INTRAVENOUS | Status: AC
  Administered 2023-07-03: 800 mg via INTRAVENOUS
  Filled 2023-07-03: qty 20

## 2023-07-03 MED ORDER — SODIUM CHLORIDE 0.9 % IV SOLN
10.0000 mg | Freq: Once | INTRAVENOUS | Status: AC
  Administered 2023-07-03: 10 mg via INTRAVENOUS
  Filled 2023-07-03: qty 10

## 2023-07-03 MED ORDER — OXYCODONE-ACETAMINOPHEN 5-325 MG PO TABS
1.0000 | ORAL_TABLET | Freq: Three times a day (TID) | ORAL | 0 refills | Status: DC | PRN
Start: 2023-07-03 — End: 2023-11-13

## 2023-07-03 MED ORDER — SODIUM CHLORIDE 0.9 % IV SOLN: Freq: Once | INTRAVENOUS | Status: AC

## 2023-07-03 MED ORDER — SODIUM CHLORIDE 0.9 % IV SOLN
15.0000 mg/kg | Freq: Once | INTRAVENOUS | Status: AC
  Administered 2023-07-03: 800 mg via INTRAVENOUS
  Filled 2023-07-03: qty 32

## 2023-07-03 MED ORDER — PROCHLORPERAZINE MALEATE 10 MG PO TABS
10.0000 mg | ORAL_TABLET | Freq: Once | ORAL | Status: AC
  Administered 2023-07-03: 10 mg via ORAL
  Filled 2023-07-03: qty 1

## 2023-07-03 NOTE — Patient Instructions (Signed)
[place] [facility]  Discharge Instructions: Thank you for choosing [facility] to provide your oncology and hematology care.   If you have a lab appointment with the Cancer Center, please go directly to the Cancer Center and check in at the registration area.   Wear comfortable clothing and clothing appropriate for easy access to any Portacath or PICC line.   We strive to give you quality time with your provider. You may need to reschedule your appointment if you arrive late (15 or more minutes).  Arriving late affects you and other patients whose appointments are after yours.  Also, if you miss three or more appointments without notifying the office, you may be dismissed from the clinic at the provider's discretion.      For prescription refill requests, have your pharmacy contact our office and allow 72 hours for refills to be completed.    Today you received the following chemotherapy and/or immunotherapy agents: Bevacizumab, Pemetrexed      To help prevent nausea and vomiting after your treatment, we encourage you to take your nausea medication as directed.  BELOW ARE SYMPTOMS THAT SHOULD BE REPORTED IMMEDIATELY: *FEVER GREATER THAN 100.4 F (38 C) OR HIGHER *CHILLS OR SWEATING *NAUSEA AND VOMITING THAT IS NOT CONTROLLED WITH YOUR NAUSEA MEDICATION *UNUSUAL SHORTNESS OF BREATH *UNUSUAL BRUISING OR BLEEDING *URINARY PROBLEMS (pain or burning when urinating, or frequent urination) *BOWEL PROBLEMS (unusual diarrhea, constipation, pain near the anus) TENDERNESS IN MOUTH AND THROAT WITH OR WITHOUT PRESENCE OF ULCERS (sore throat, sores in mouth, or a toothache) UNUSUAL RASH, SWELLING OR PAIN  UNUSUAL VAGINAL DISCHARGE OR ITCHING   Items with * indicate a potential emergency and should be followed up as soon as possible or go to the Emergency Department if any problems should occur.  Please show the CHEMOTHERAPY ALERT CARD or IMMUNOTHERAPY ALERT  CARD at check-in to the Emergency Department and triage nurse.  Should you have questions after your visit or need to cancel or reschedule your appointment, please contact [place] [facility]  Dept: [phone]  and follow the prompts.  Office hours are 8:00 a.m. to 4:30 p.m. Monday - Friday. Please note that voicemails left after 4:00 p.m. may not be returned until the following business day.  We are closed weekends and major holidays. You have access to a nurse at all times for urgent questions. Please call the main number to the clinic Dept: [phone] and follow the prompts.   For any non-urgent questions, you may also contact your provider using MyChart. We now offer e-Visits for anyone 28 and older to request care online for non-urgent symptoms. For details visit [email or web address].   Also download the MyChart app! Go to the app store, search "MyChart", open the app, select [place], and log in with your MyChart username and password.

## 2023-07-17 ENCOUNTER — Ambulatory Visit (HOSPITAL_COMMUNITY)
Admission: RE | Admit: 2023-07-17 | Discharge: 2023-07-17 | Disposition: A | Discharge status: Home / Self Care | Payer: Commercial Managed Care - PPO | Source: Ambulatory Visit | Attending: Physician Assistant | Admitting: Physician Assistant

## 2023-07-17 ENCOUNTER — Encounter (HOSPITAL_COMMUNITY): Payer: Self-pay

## 2023-07-17 DIAGNOSIS — C3491 Malignant neoplasm of unspecified part of right bronchus or lung: Secondary | ICD-10-CM | POA: Insufficient documentation

## 2023-07-17 DIAGNOSIS — G893 Neoplasm related pain (acute) (chronic): Secondary | ICD-10-CM | POA: Insufficient documentation

## 2023-07-17 MED ORDER — IOHEXOL 9 MG/ML PO SOLN
ORAL | Status: AC
  Filled 2023-07-17: qty 1000

## 2023-07-17 MED ORDER — IOHEXOL 300 MG/ML  SOLN
100.0000 mL | Freq: Once | INTRAMUSCULAR | Status: AC | PRN
  Administered 2023-07-17: 100 mL via INTRAVENOUS

## 2023-07-17 MED ORDER — IOHEXOL 9 MG/ML PO SOLN
1000.0000 mL | ORAL | Status: AC
  Administered 2023-07-17: 1000 mL via ORAL

## 2023-07-17 MED ORDER — SODIUM CHLORIDE (PF) 0.9 % IJ SOLN
INTRAMUSCULAR | Status: AC
  Filled 2023-07-17: qty 50

## 2023-07-19 NOTE — Progress Notes (Unsigned)
[facility] OFFICE PROGRESS NOTE  Kirsten Peak, PA-C [address] [place] [address]  DIAGNOSIS: Stage IV (T4, N2, M1 a) non-small cell lung cancer, adenocarcinoma presented with multifocal disease involving the right upper lobe, right lower lobe as well as suspicious lower paratracheal lymphadenopathy and groundglass nodules in the left lung diagnosed in May 2022. The patient had molecular studies performed by [facility] and that showed no detectable mutation but this is likely secondary to low-level circulating free tumor DNA.    PRIOR THERAPY: None  CURRENT THERAPY: Palliative systemic chemotherapy with carboplatin for AUC of 5, Alimta 500 Mg/M2 and Avastin 15 Mg/KG every 3 weeks.  The patient is not a great candidate for immunotherapy in the event that she has a genetic mutation.  She is status post 36 cycles. Starting from cycle #6, the patient will start maintenance Alimta and Avastin.    INTERVAL HISTORY: Kirsten Williams Williams 52 y.o. Williams returns to the clinic today for a follow-up visit. The patient is feeling fairly well today without any concerning complaints.   She mentions she has a couple upcoming appointments.  She tells me that she has been undergoing physical therapy which has been very beneficial for her.  She is trying to be active and uses a stationary bike.  She has been having some ongoing concerns related to discomfort in her feet.  She had a nerve conduction study last year.  She sees her neurologist in October.  She also mention she has an eye doctor appointment in October to follow-up on some blurry vision.  She is also scheduled to see her dermatologist on Friday this week.  She has a rash that has been present on her right hand for several months.  She uses hydrocortisone cream which helps with the itch but it does not resolve the rash.   She denies any fever or chills.  She has occasional night sweats which is not new for her she has these  intermittently.  Reports sometimes she has stable decreased appetite as a few days after treatment and her weight is up and down.  Overall her weight is fairly stable compared to her last visit.  She denies nausea, vomiting, diarrhea, or constipation. She denies any significant shortness of breath unless with strenuous activity.  A few days after chemotherapy, she experiences a bandlike sensation around her diaphragm which has been occurring since starting chemotherapy 2 years ago.  This is unchanged.  She is prescribed oxycodone for significant pain. She uses it sparingly and tries to take Tylenol and only reserves oxycodone if needed.  Denies any significant cough at this time.  Denies any abnormal bleeding or bruising.  The patient recently had a restaging CT scan. She is here for evaluation and repeat blood work before undergoing cycle #37.    MEDICAL HISTORY: Past Medical History:  Diagnosis Date   Adenocarcinoma, lung, right (HCC) 05/03/2021   Kirsten Williams Williams is a 52 y.o. Williams with a history of lung nodules dating back to 2019 who is referred in consultation with Kirsten Peak, PA-C for assessment and management. She is a former smoker having quit in 2016. 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 She missed her screening CT last year and imaging was ordered last month. CT chest from 02-16-2021 reveals pro   Anxiety    GERD (gastroesophageal reflux disease)    SLE (systemic lupus erythematosus) (HCC) 05/03/2021   SLE (systemic lupus erythematosus) (HCC)  05/03/2021    ALLERGIES:  is allergic to clindamycin/lincomycin and carboplatin.  MEDICATIONS:  Current Outpatient Medications  Medication Sig Dispense Refill   amLODipine (NORVASC) 5 MG tablet Take 5 mg by mouth daily.     B Complex Vitamins (B COMPLEX 50 PO) Take by mouth.     Calcium Carbonate-Vit D-Min (CALCIUM 1200 PO) Take 1 capsule by mouth daily.     cholecalciferol (VITAMIN D3) 25 MCG (1000 UNIT) tablet  Take 2,000 Units by mouth daily.     cyclobenzaprine (FLEXERIL) 10 MG tablet Take 10 mg by mouth at bedtime as needed for muscle spasms.     denosumab (PROLIA) 60 MG/ML SOSY injection Inject 60 mg into the skin every 6 (six) months.     dexamethasone (DECADRON) 4 MG tablet 4 mg p.o. twice daily the day before, day of and day after the chemotherapy every 3 weeks. 40 tablet 2   dicyclomine (BENTYL) 10 MG capsule Take 10 mg by mouth every 6 (six) hours as needed.     diphenhydrAMINE (BENADRYL) 25 MG tablet Take 25 mg by mouth daily as needed for allergies.     famotidine (PEPCID) 20 MG tablet Take 20 mg by mouth at bedtime.     fluticasone (FLONASE) 50 MCG/ACT nasal spray Place 2 sprays into both nostrils daily.     folic acid (FOLVITE) 1 MG tablet TAKE 1 TABLET(1 MG) BY MOUTH DAILY 30 tablet 4   gabapentin (NEURONTIN) 300 MG capsule Take 300 mg by mouth 3 (three) times daily.     hydrocortisone 1 % lotion Apply 1 application topically 2 (two) times daily. 118 mL 0   hydroxychloroquine (PLAQUENIL) 200 MG tablet Take 200 mg by mouth 2 (two) times daily.     Multiple Vitamins-Minerals (MULTI ADULT GUMMIES) CHEW Chew 2 tablets by mouth daily.     omeprazole (PRILOSEC) 40 MG capsule Take 40 mg by mouth daily as needed (acid reflux).     oxyCODONE-acetaminophen (PERCOCET/ROXICET) 5-325 MG tablet Take 1 tablet by mouth every 8 (eight) hours as needed for severe pain. 30 tablet 0   potassium chloride SA (KLOR-CON M) 20 MEQ tablet Take 1 tablet (20 mEq total) by mouth daily. 5 tablet 0   Probiotic Product (PROBIOTIC-10 PO) Take by mouth.     prochlorperazine (COMPAZINE) 10 MG tablet TAKE 1 TABLET(10 MG) BY MOUTH EVERY 6 HOURS AS NEEDED 30 tablet 2   rizatriptan (MAXALT-MLT) 10 MG disintegrating tablet Take 10 mg by mouth 2 (two) times daily as needed.     tetrahydrozoline 0.05 % ophthalmic solution Place 1 drop into both eyes once. Systane Eye Drops once daily both eyes     triamcinolone ointment (KENALOG)  0.5 % Apply topically 3 (three) times daily.     No current facility-administered medications for this visit.    SURGICAL HISTORY:  Past Surgical History:  Procedure Laterality Date   ABDOMINAL HYSTERECTOMY     BRONCHIAL BIOPSY  04/24/2021   Procedure: BRONCHIAL BIOPSIES;  Surgeon: Leslye Peer, MD;  Location: [facility] ENDOSCOPY;  Service: Pulmonary;;   BRONCHIAL BRUSHINGS  04/24/2021   Procedure: BRONCHIAL BRUSHINGS;  Surgeon: Leslye Peer, MD;  Location: [facility] ENDOSCOPY;  Service: Pulmonary;;   BRONCHIAL NEEDLE ASPIRATION BIOPSY  04/24/2021   Procedure: BRONCHIAL NEEDLE ASPIRATION BIOPSIES;  Surgeon: Leslye Peer, MD;  Location: [facility] ENDOSCOPY;  Service: Pulmonary;;   BRONCHIAL WASHINGS  04/24/2021   Procedure: BRONCHIAL WASHINGS;  Surgeon: Leslye Peer, MD;  Location: [facility] ENDOSCOPY;  Service: Pulmonary;;  CESAREAN SECTION  11/09/1990   DILATION AND CURETTAGE OF UTERUS Bilateral 09/09/1996   FIDUCIAL MARKER PLACEMENT  04/24/2021   Procedure: FIDUCIAL MARKER PLACEMENT;  Surgeon: Leslye Peer, MD;  Location: [facility] ENDOSCOPY;  Service: Pulmonary;;   TONSILLECTOMY  12/10/1977   VIDEO BRONCHOSCOPY WITH ENDOBRONCHIAL NAVIGATION Bilateral 04/24/2021   Procedure: VIDEO BRONCHOSCOPY WITH ENDOBRONCHIAL NAVIGATION;  Surgeon: Leslye Peer, MD;  Location: [facility] ENDOSCOPY;  Service: Pulmonary;  Laterality: Bilateral;    REVIEW OF SYSTEMS:   Constitutional: Positive for fatigue 1 week following treatment typically and some decreased appetite a few days following treatment. Negative for  chills, fever and unexpected weight change.  HENT: Negative for mouth sores, nosebleeds, sore throat and trouble swallowing.   Eyes: Positive for some blurry vision and is expected to follow-up with her eye doctor soon. Negative for eye problems and icterus.  Respiratory: Positive for stable intermittent shortness of breath with certain activities. Negative for hemoptysis, cough, and wheezing.  Cardiovascular: Negative  for chest pain and leg swelling.  Gastrointestinal: Negative for abdominal pain, nausea, vomiting, diarrhea, constipation. Genitourinary: Negative for bladder incontinence, difficulty urinating, dysuria, frequency and hematuria.   Musculoskeletal: Negative for back pain, gait problem, neck pain and neck stiffness.  Skin: Positive for rash on right hand Neurological: Negative for dizziness, headaches, extremity weakness, gait problem, light-headedness and seizures.  Hematological: Negative for adenopathy. Does not bruise/bleed easily.  Psychiatric/Behavioral: Negative for confusion, depression and sleep disturbance. The patient is not nervous/anxious.    PHYSICAL EXAMINATION:  Blood pressure (!) 153/88, pulse 86, temperature (!) 97.5 F (36.4 C), temperature source Temporal, resp. rate 16, weight 112 lb 1.6 oz (50.8 kg), SpO2 100%.  ECOG PERFORMANCE STATUS: 1  Physical Exam  Constitutional: Oriented to person, place, and time and well-developed, well-nourished, and in no distress.  HENT:  Head: Normocephalic and atraumatic.  Mouth/Throat: Oropharynx is clear and moist. No oropharyngeal exudate.  Eyes: Conjunctivae are normal. Right eye exhibits no discharge. Left eye exhibits no discharge. No scleral icterus.  Neck: Normal range of motion. Neck supple.  Cardiovascular: Normal rate, regular rhythm, normal heart sounds and intact distal pulses.   Pulmonary/Chest: Effort normal and breath sounds normal. No respiratory distress. No wheezes. No rales.  Abdominal: Soft. Bowel sounds are normal. Exhibits no distension and no mass. There is no tenderness.  Musculoskeletal: Normal range of motion. Exhibits no edema.  Lymphadenopathy:    No cervical adenopathy.  Neurological: Alert and oriented to person, place, and time. Exhibits normal muscle tone. Gait normal. Coordination normal.  Skin: Positive for rash on right hand. Skin is warm and dry. Not diaphoretic. No erythema. No pallor.   Psychiatric: Mood, memory and judgment normal.  Vitals reviewed.  LABORATORY DATA: Lab Results  Component Value Date   WBC 9.6 07/24/2023   HGB 13.2 07/24/2023   HCT 41.9 07/24/2023   MCV 99.1 07/24/2023   PLT 257 07/24/2023      Chemistry      Component Value Date/Time   NA 140 07/24/2023 0855   NA 140 03/13/2021 0000   K 3.4 (L) 07/24/2023 0855   CL 105 07/24/2023 0855   CO2 27 07/24/2023 0855   BUN 6 07/24/2023 0855   BUN 10 03/13/2021 0000   CREATININE 0.87 07/24/2023 0855   GLU 107 03/13/2021 0000      Component Value Date/Time   CALCIUM 9.4 07/24/2023 0855   ALKPHOS 116 07/24/2023 0855   AST 20 07/24/2023 0855   ALT 13 07/24/2023 0855  BILITOT 0.3 07/24/2023 0855       RADIOGRAPHIC STUDIES:  CT CHEST ABDOMEN PELVIS W CONTRAST  Result Date: 07/18/2023 CLINICAL DATA:  Follow-up of stage IV lung adenocarcinoma. * Tracking Code: BO * EXAM: CT CHEST, ABDOMEN, AND PELVIS WITH CONTRAST TECHNIQUE: Multidetector CT imaging of the chest, abdomen and pelvis was performed following the standard protocol during bolus administration of intravenous contrast. RADIATION DOSE REDUCTION: This exam was performed according to the departmental dose-optimization program which includes automated exposure control, adjustment of the mA and/or kV according to patient size and/or use of iterative reconstruction technique. CONTRAST:  OMNIPAQUE IOHEXOL 300 MG/ML  SOLN COMPARISON:  05/07/2023 FINDINGS: CT CHEST FINDINGS Cardiovascular: Aortic atherosclerosis. Normal heart size, without pericardial effusion. No central pulmonary embolism, on this non-dedicated study. Mediastinum/Nodes: No supraclavicular adenopathy. 11 mm right paratracheal node on 21/2 is unchanged. No hilar adenopathy. Lungs/Pleura: No pleural fluid.  Mild centrilobular emphysema. Minimal biapical pleuroparenchymal scarring. Right lower lobe biopsy clips. Superior segment right lower lobe mixed attenuation nodule measures 2.4  x 1.6 cm on 60/6 versus 2.2 x 1.5 cm on the prior. Solid component laterally of 1.2 cm on 61/6 measured 1.2 cm on the prior (when remeasured). Posterior right upper lobe mixed attenuation nodule measures 1.4 cm on 34/6 and is similar. The posteromedial solid portion may be slightly more distinct today. Left upper lobe ground-glass nodule of 2.0 cm on 39/6 is unchanged. Other smaller ground-glass nodules are identified on series 6 and are unchanged. A 3 mm right upper lobe solid nodule is similar on 52/6. Musculoskeletal: No acute osseous abnormality. CT ABDOMEN PELVIS FINDINGS Hepatobiliary: Focal steatosis adjacent the falciform ligament. Normal gallbladder, without biliary ductal dilatation. Pancreas: Normal, without mass or ductal dilatation. Spleen: Normal in size, without focal abnormality. Adrenals/Urinary Tract: Bilateral adrenal nodules are unchanged, including at up to 1.8 cm on the right and 2.1 cm on the left. Right extrarenal pelvis. Normal left kidney. No hydronephrosis. Normal urinary bladder. Stomach/Bowel: Proximal gastric underdistention. The cecum extends into the pelvis. Normal deep pelvic appendix and terminal ileum. Normal small bowel. Vascular/Lymphatic: Aortic atherosclerosis. No abdominopelvic adenopathy. Reproductive: Hysterectomy.  No adnexal mass. Other: No significant free fluid. Mild pelvic floor laxity. No evidence of omental or peritoneal disease. Musculoskeletal: No acute osseous abnormality. IMPRESSION: 1. Multiple subsolid pulmonary nodules are primarily similar as detailed above. Right upper lobe nodule may have increased soft tissue component within. Superior segment right lower lobe nodule has minimally enlarged. Potential clinical strategies include surveillance with CT at 3-6 months versus PET to direct tissue sampling. 2. Similar mild mediastinal adenopathy. 3. No acute process or evidence of metastatic disease in the abdomen or pelvis. 4. Bilateral adrenal adenomas . In the  absence of clinically indicated signs/symptoms require(s) no independent follow-up. 5. Aortic atherosclerosis (ICD10-I70.0) and emphysema (ICD10-J43.9). Electronically Signed   By: Jeronimo Greaves M.D.   On: 07/18/2023 14:08     ASSESSMENT/PLAN:  This is a very pleasant 52 year old Caucasian Williams diagnosed with stage IV (T4, N2, M1 a) non-small cell lung cancer, adenocarcinoma presented with multifocal disease involving the right upper lobe, right lower lobe as well as suspicious lower paratracheal lymphadenopathy and groundglass nodules in the left lung diagnosed in May 2022. The patient had molecular studies performed by [facility] and that showed no detectable mutation but this is likely secondary to low-level circulating free tumor DNA. If the patient has disease progression in the future, then we will likely retest her for molecular studies.    The  patient is currently undergoing systemic chemotherapy with carboplatin for an AUC 5, Alimta 500 mg per metered square, and and Avastin 1500 mg/kg IV every 3 weeks.  She is status post 36 cycles.  Starting from cycle #7, the patient has been on maintenance treatment with Alimta and Avastin.  The recently had a restaging CT scan performed.  The patient was seen with Dr. Arbutus Ped today.  Dr. Arbutus Ped personally and independently reviewed the scan and discussed the results with the patient today.  The scan showed no evidence of disease progression except for one of the right upper lobe nodules may have increased soft tissue component within it and a superior segment right lower lobe nodule may have minimally enlarged.  We will continue on the same treatment at the same dose with close monitoring with repeat CT scan in the near future..    Labs were reviewed. Recommend she proceed with cycle #37 today as scheduled.    We will see her back for a follow up visit in 3 weeks for evaluation and repeat blood work before starting cycle #37.   She will follow-up  with neurology regarding her foot.   She will follow-up with her eye doctor regarding her vision.  He is scheduled to see dermatology later this week.  I congratulated the patient on her commitment to continuing physical therapy exercises and being as active as possible.  The patient was advised to call immediately if she has any concerning symptoms in the interval. The patient voices understanding of current disease status and treatment options and is in agreement with the current care plan. All questions were answered. The patient knows to call the clinic with any problems, questions or concerns. We can certainly see the patient much sooner if necessary     Orders Placed This Encounter  Procedures   CBC with Differential (Cancer Center Only)    Standing Status:   Future    Standing Expiration Date:   09/03/2024   CMP (Cancer Center only)    Standing Status:   Future    Standing Expiration Date:   09/03/2024   Total Protein, Urine dipstick    Standing Status:   Future    Standing Expiration Date:   09/03/2024   CBC with Differential (Cancer Center Only)    Standing Status:   Future    Standing Expiration Date:   09/24/2024   CMP (Cancer Center only)    Standing Status:   Future    Standing Expiration Date:   09/24/2024   Total Protein, Urine dipstick    Standing Status:   Future    Standing Expiration Date:   09/24/2024   CBC with Differential (Cancer Center Only)    Standing Status:   Future    Standing Expiration Date:   10/15/2024   CMP (Cancer Center only)    Standing Status:   Future    Standing Expiration Date:   10/15/2024   Total Protein, Urine dipstick    Standing Status:   Future    Standing Expiration Date:   10/15/2024   CBC with Differential (Cancer Center Only)    Standing Status:   Future    Standing Expiration Date:   11/05/2024   CMP (Cancer Center only)    Standing Status:   Future    Standing Expiration Date:   11/05/2024   Total Protein, Urine dipstick     Standing Status:   Future    Standing Expiration Date:   11/05/2024  CBC with Differential (Cancer Center Only)    Standing Status:   Future    Standing Expiration Date:   11/26/2024   CMP (Cancer Center only)    Standing Status:   Future    Standing Expiration Date:   11/26/2024   Total Protein, Urine dipstick    Standing Status:   Future    Standing Expiration Date:   11/26/2024   CBC with Differential (Cancer Center Only)    Standing Status:   Future    Standing Expiration Date:   12/17/2024   CMP (Cancer Center only)    Standing Status:   Future    Standing Expiration Date:   12/17/2024   Total Protein, Urine dipstick    Standing Status:   Future    Standing Expiration Date:   12/17/2024   CBC with Differential (Cancer Center Only)    Standing Status:   Future    Standing Expiration Date:   01/07/2025   CMP (Cancer Center only)    Standing Status:   Future    Standing Expiration Date:   01/07/2025   Total Protein, Urine dipstick    Standing Status:   Future    Standing Expiration Date:   01/07/2025     Johnette Abraham , PA-C 07/24/23  ADDENDUM: Hematology/Oncology Attending: I had a face-to-face encounter with the patient today.  I reviewed her record, lab and scan and recommended her care plan.  This is a very pleasant 52 years old white Williams with a stage IV non-small cell lung cancer, adenocarcinoma diagnosed in May 2022 with no actionable mutations.  The patient was not a candidate for treatment with immunotherapy because of the history of systemic lupus erythematosus.  She started systemic treatment with carboplatin, Alimta and Avastin for 6 cycles and currently on maintenance treatment with Alimta and Avastin for 30 more cycles.  She has been tolerating her treatment well with no concerning adverse effects. She had repeat CT scan of the chest, abdomen and pelvis performed recently.  I personally and independently reviewed the scan and discussed the result with the  patient today. Her scan showed no concerning findings for disease progression. I recommended for her to continue her current treatment with maintenance Alimta and Avastin every 3 weeks. She will come back for follow-up visit in 3 weeks for evaluation before the next cycle of her treatment. She was advised to call immediately if she has any other concerning symptoms in the interval. The total time spent in the appointment was 30 minutes. Disclaimer: This note was dictated with voice recognition software. Similar sounding words can inadvertently be transcribed and may be missed upon review. Lajuana Matte, MD

## 2023-07-23 MED FILL — Dexamethasone Sodium Phosphate Inj 100 MG/10ML: INTRAMUSCULAR | Qty: 1 | Status: AC

## 2023-07-24 ENCOUNTER — Inpatient Hospital Stay: Payer: Commercial Managed Care - PPO

## 2023-07-24 ENCOUNTER — Other Ambulatory Visit: Payer: Self-pay

## 2023-07-24 ENCOUNTER — Inpatient Hospital Stay (HOSPITAL_BASED_OUTPATIENT_CLINIC_OR_DEPARTMENT_OTHER): Payer: Commercial Managed Care - PPO | Admitting: Physician Assistant

## 2023-07-24 ENCOUNTER — Inpatient Hospital Stay: Payer: Commercial Managed Care - PPO | Attending: Hematology and Oncology

## 2023-07-24 VITALS — BP 138/83 | HR 81 | Resp 16

## 2023-07-24 VITALS — BP 153/88 | HR 86 | Temp 97.5°F | Resp 16 | Wt 112.1 lb

## 2023-07-24 DIAGNOSIS — Z5112 Encounter for antineoplastic immunotherapy: Secondary | ICD-10-CM | POA: Insufficient documentation

## 2023-07-24 DIAGNOSIS — Z5111 Encounter for antineoplastic chemotherapy: Secondary | ICD-10-CM

## 2023-07-24 DIAGNOSIS — C3411 Malignant neoplasm of upper lobe, right bronchus or lung: Secondary | ICD-10-CM | POA: Insufficient documentation

## 2023-07-24 DIAGNOSIS — C3491 Malignant neoplasm of unspecified part of right bronchus or lung: Secondary | ICD-10-CM

## 2023-07-24 DIAGNOSIS — M329 Systemic lupus erythematosus, unspecified: Secondary | ICD-10-CM | POA: Diagnosis not present

## 2023-07-24 LAB — CBC WITH DIFFERENTIAL (CANCER CENTER ONLY)
Abs Immature Granulocytes: 0.04 10*3/uL (ref 0.00–0.07)
Basophils Absolute: 0.1 10*3/uL (ref 0.0–0.1)
Basophils Relative: 1 %
Eosinophils Absolute: 0.1 10*3/uL (ref 0.0–0.5)
Eosinophils Relative: 1 %
HCT: 41.9 % (ref 36.0–46.0)
Hemoglobin: 13.2 g/dL (ref 12.0–15.0)
Immature Granulocytes: 0 %
Lymphocytes Relative: 21 %
Lymphs Abs: 2 10*3/uL (ref 0.7–4.0)
MCH: 31.2 pg (ref 26.0–34.0)
MCHC: 31.5 g/dL (ref 30.0–36.0)
MCV: 99.1 fL (ref 80.0–100.0)
Monocytes Absolute: 0.8 10*3/uL (ref 0.1–1.0)
Monocytes Relative: 9 %
Neutro Abs: 6.5 10*3/uL (ref 1.7–7.7)
Neutrophils Relative %: 68 %
Platelet Count: 257 10*3/uL (ref 150–400)
RBC: 4.23 MIL/uL (ref 3.87–5.11)
RDW: 17 % — ABNORMAL HIGH (ref 11.5–15.5)
WBC Count: 9.6 10*3/uL (ref 4.0–10.5)
nRBC: 0 % (ref 0.0–0.2)

## 2023-07-24 LAB — CMP (CANCER CENTER ONLY)
ALT: 13 U/L (ref 0–44)
AST: 20 U/L (ref 15–41)
Albumin: 3.9 g/dL (ref 3.5–5.0)
Alkaline Phosphatase: 116 U/L (ref 38–126)
Anion gap: 8 (ref 5–15)
BUN: 6 mg/dL (ref 6–20)
CO2: 27 mmol/L (ref 22–32)
Calcium: 9.4 mg/dL (ref 8.9–10.3)
Chloride: 105 mmol/L (ref 98–111)
Creatinine: 0.87 mg/dL (ref 0.44–1.00)
GFR, Estimated: >60 mL/min (ref >60)
Glucose, Bld: 110 mg/dL — ABNORMAL HIGH (ref 70–99)
Potassium: 3.4 mmol/L — ABNORMAL LOW (ref 3.5–5.1)
Sodium: 140 mmol/L (ref 135–145)
Total Bilirubin: 0.3 mg/dL (ref 0.3–1.2)
Total Protein: 7.5 g/dL (ref 6.5–8.1)

## 2023-07-24 LAB — TOTAL PROTEIN, URINE DIPSTICK: Protein, ur: 30 mg/dL — AB

## 2023-07-24 MED ORDER — PROCHLORPERAZINE MALEATE 10 MG PO TABS
10.0000 mg | ORAL_TABLET | Freq: Once | ORAL | Status: AC
  Administered 2023-07-24: 10 mg via ORAL
  Filled 2023-07-24: qty 1

## 2023-07-24 MED ORDER — SODIUM CHLORIDE 0.9 % IV SOLN
500.0000 mg/m2 | Freq: Once | INTRAVENOUS | Status: AC
  Administered 2023-07-24: 800 mg via INTRAVENOUS
  Filled 2023-07-24: qty 20

## 2023-07-24 MED ORDER — SODIUM CHLORIDE 0.9 % IV SOLN
15.0000 mg/kg | Freq: Once | INTRAVENOUS | Status: AC
  Administered 2023-07-24: 800 mg via INTRAVENOUS
  Filled 2023-07-24: qty 32

## 2023-07-24 MED ORDER — SODIUM CHLORIDE 0.9 % IV SOLN
10.0000 mg | Freq: Once | INTRAVENOUS | Status: AC
  Administered 2023-07-24: 10 mg via INTRAVENOUS
  Filled 2023-07-24: qty 10

## 2023-07-24 MED ORDER — SODIUM CHLORIDE 0.9 % IV SOLN: Freq: Once | INTRAVENOUS | Status: AC

## 2023-07-24 NOTE — Patient Instructions (Signed)
[place] [facility]  Discharge Instructions: Thank you for choosing [facility] to provide your oncology and hematology care.   If you have a lab appointment with the Cancer Center, please go directly to the Cancer Center and check in at the registration area.   Wear comfortable clothing and clothing appropriate for easy access to any Portacath or PICC line.   We strive to give you quality time with your provider. You may need to reschedule your appointment if you arrive late (15 or more minutes).  Arriving late affects you and other patients whose appointments are after yours.  Also, if you miss three or more appointments without notifying the office, you may be dismissed from the clinic at the provider's discretion.      For prescription refill requests, have your pharmacy contact our office and allow 72 hours for refills to be completed.    Today you received the following chemotherapy and/or immunotherapy agents: Bevacizumab, Pemetrexed      To help prevent nausea and vomiting after your treatment, we encourage you to take your nausea medication as directed.  BELOW ARE SYMPTOMS THAT SHOULD BE REPORTED IMMEDIATELY: *FEVER GREATER THAN 100.4 F (38 C) OR HIGHER *CHILLS OR SWEATING *NAUSEA AND VOMITING THAT IS NOT CONTROLLED WITH YOUR NAUSEA MEDICATION *UNUSUAL SHORTNESS OF BREATH *UNUSUAL BRUISING OR BLEEDING *URINARY PROBLEMS (pain or burning when urinating, or frequent urination) *BOWEL PROBLEMS (unusual diarrhea, constipation, pain near the anus) TENDERNESS IN MOUTH AND THROAT WITH OR WITHOUT PRESENCE OF ULCERS (sore throat, sores in mouth, or a toothache) UNUSUAL RASH, SWELLING OR PAIN  UNUSUAL VAGINAL DISCHARGE OR ITCHING   Items with * indicate a potential emergency and should be followed up as soon as possible or go to the Emergency Department if any problems should occur.  Please show the CHEMOTHERAPY ALERT CARD or IMMUNOTHERAPY ALERT  CARD at check-in to the Emergency Department and triage nurse.  Should you have questions after your visit or need to cancel or reschedule your appointment, please contact [place] [facility]  Dept: [phone]  and follow the prompts.  Office hours are 8:00 a.m. to 4:30 p.m. Monday - Friday. Please note that voicemails left after 4:00 p.m. may not be returned until the following business day.  We are closed weekends and major holidays. You have access to a nurse at all times for urgent questions. Please call the main number to the clinic Dept: [phone] and follow the prompts.   For any non-urgent questions, you may also contact your provider using MyChart. We now offer e-Visits for anyone 28 and older to request care online for non-urgent symptoms. For details visit [email or web address].   Also download the MyChart app! Go to the app store, search "MyChart", open the app, select [place], and log in with your MyChart username and password.

## 2023-08-14 ENCOUNTER — Inpatient Hospital Stay: Payer: Commercial Managed Care - PPO

## 2023-08-14 ENCOUNTER — Inpatient Hospital Stay: Payer: Commercial Managed Care - PPO | Attending: Hematology and Oncology

## 2023-08-14 ENCOUNTER — Encounter: Payer: Self-pay | Admitting: Medical Oncology

## 2023-08-14 ENCOUNTER — Inpatient Hospital Stay (HOSPITAL_BASED_OUTPATIENT_CLINIC_OR_DEPARTMENT_OTHER): Payer: Commercial Managed Care - PPO | Admitting: Internal Medicine

## 2023-08-14 VITALS — BP 138/75 | HR 85 | Resp 18

## 2023-08-14 DIAGNOSIS — Z7952 Long term (current) use of systemic steroids: Secondary | ICD-10-CM | POA: Diagnosis not present

## 2023-08-14 DIAGNOSIS — Z5111 Encounter for antineoplastic chemotherapy: Secondary | ICD-10-CM | POA: Insufficient documentation

## 2023-08-14 DIAGNOSIS — C3491 Malignant neoplasm of unspecified part of right bronchus or lung: Secondary | ICD-10-CM | POA: Diagnosis not present

## 2023-08-14 DIAGNOSIS — C3411 Malignant neoplasm of upper lobe, right bronchus or lung: Secondary | ICD-10-CM | POA: Diagnosis present

## 2023-08-14 DIAGNOSIS — Z87891 Personal history of nicotine dependence: Secondary | ICD-10-CM | POA: Insufficient documentation

## 2023-08-14 DIAGNOSIS — M329 Systemic lupus erythematosus, unspecified: Secondary | ICD-10-CM | POA: Diagnosis not present

## 2023-08-14 DIAGNOSIS — Z5112 Encounter for antineoplastic immunotherapy: Secondary | ICD-10-CM | POA: Diagnosis present

## 2023-08-14 DIAGNOSIS — Z79899 Other long term (current) drug therapy: Secondary | ICD-10-CM | POA: Insufficient documentation

## 2023-08-14 LAB — CBC WITH DIFFERENTIAL (CANCER CENTER ONLY)
Abs Immature Granulocytes: 0.01 10*3/uL (ref 0.00–0.07)
Basophils Absolute: 0.1 10*3/uL (ref 0.0–0.1)
Basophils Relative: 1 %
Eosinophils Absolute: 0.1 10*3/uL (ref 0.0–0.5)
Eosinophils Relative: 2 %
HCT: 40 % (ref 36.0–46.0)
Hemoglobin: 12.8 g/dL (ref 12.0–15.0)
Immature Granulocytes: 0 %
Lymphocytes Relative: 30 %
Lymphs Abs: 2 10*3/uL (ref 0.7–4.0)
MCH: 31.8 pg (ref 26.0–34.0)
MCHC: 32 g/dL (ref 30.0–36.0)
MCV: 99.3 fL (ref 80.0–100.0)
Monocytes Absolute: 0.8 10*3/uL (ref 0.1–1.0)
Monocytes Relative: 11 %
Neutro Abs: 3.8 10*3/uL (ref 1.7–7.7)
Neutrophils Relative %: 56 %
Platelet Count: 222 10*3/uL (ref 150–400)
RBC: 4.03 MIL/uL (ref 3.87–5.11)
RDW: 16.8 % — ABNORMAL HIGH (ref 11.5–15.5)
WBC Count: 6.8 10*3/uL (ref 4.0–10.5)
nRBC: 0 % (ref 0.0–0.2)

## 2023-08-14 LAB — CMP (CANCER CENTER ONLY)
ALT: 12 U/L (ref 0–44)
AST: 24 U/L (ref 15–41)
Albumin: 3.9 g/dL (ref 3.5–5.0)
Alkaline Phosphatase: 112 U/L (ref 38–126)
Anion gap: 11 (ref 5–15)
BUN: 8 mg/dL (ref 6–20)
CO2: 25 mmol/L (ref 22–32)
Calcium: 9.5 mg/dL (ref 8.9–10.3)
Chloride: 103 mmol/L (ref 98–111)
Creatinine: 0.93 mg/dL (ref 0.44–1.00)
GFR, Estimated: >60 mL/min (ref >60)
Glucose, Bld: 146 mg/dL — ABNORMAL HIGH (ref 70–99)
Potassium: 3.7 mmol/L (ref 3.5–5.1)
Sodium: 139 mmol/L (ref 135–145)
Total Bilirubin: 0.3 mg/dL (ref 0.3–1.2)
Total Protein: 7.6 g/dL (ref 6.5–8.1)

## 2023-08-14 LAB — TOTAL PROTEIN, URINE DIPSTICK: Protein, ur: 100 mg/dL — AB

## 2023-08-14 MED ORDER — PROCHLORPERAZINE MALEATE 10 MG PO TABS
10.0000 mg | ORAL_TABLET | Freq: Once | ORAL | Status: AC
  Administered 2023-08-14: 10 mg via ORAL
  Filled 2023-08-14: qty 1

## 2023-08-14 MED ORDER — SODIUM CHLORIDE 0.9 % IV SOLN
10.0000 mg | Freq: Once | INTRAVENOUS | Status: AC
  Administered 2023-08-14: 10 mg via INTRAVENOUS
  Filled 2023-08-14: qty 10

## 2023-08-14 MED ORDER — SODIUM CHLORIDE 0.9 % IV SOLN
15.0000 mg/kg | Freq: Once | INTRAVENOUS | Status: AC
  Administered 2023-08-14: 800 mg via INTRAVENOUS
  Filled 2023-08-14: qty 32

## 2023-08-14 MED ORDER — SODIUM CHLORIDE 0.9 % IV SOLN
500.0000 mg/m2 | Freq: Once | INTRAVENOUS | Status: AC
  Administered 2023-08-14: 800 mg via INTRAVENOUS
  Filled 2023-08-14: qty 20

## 2023-08-14 MED ORDER — CYANOCOBALAMIN 1000 MCG/ML IJ SOLN
1000.0000 ug | Freq: Once | INTRAMUSCULAR | Status: AC
  Administered 2023-08-14: 1000 ug via INTRAMUSCULAR
  Filled 2023-08-14: qty 1

## 2023-08-14 MED ORDER — SODIUM CHLORIDE 0.9 % IV SOLN: Freq: Once | INTRAVENOUS | Status: AC

## 2023-08-14 NOTE — Patient Instructions (Signed)
[place] [facility]  Discharge Instructions: Thank you for choosing [facility] to provide your oncology and hematology care.   If you have a lab appointment with the Cancer Center, please go directly to the Cancer Center and check in at the registration area.   Wear comfortable clothing and clothing appropriate for easy access to any Portacath or PICC line.   We strive to give you quality time with your provider. You may need to reschedule your appointment if you arrive late (15 or more minutes).  Arriving late affects you and other patients whose appointments are after yours.  Also, if you miss three or more appointments without notifying the office, you may be dismissed from the clinic at the provider's discretion.      For prescription refill requests, have your pharmacy contact our office and allow 72 hours for refills to be completed.    Today you received the following chemotherapy and/or immunotherapy agents: Bevacizumab, Pemetrexed      To help prevent nausea and vomiting after your treatment, we encourage you to take your nausea medication as directed.  BELOW ARE SYMPTOMS THAT SHOULD BE REPORTED IMMEDIATELY: *FEVER GREATER THAN 100.4 F (38 C) OR HIGHER *CHILLS OR SWEATING *NAUSEA AND VOMITING THAT IS NOT CONTROLLED WITH YOUR NAUSEA MEDICATION *UNUSUAL SHORTNESS OF BREATH *UNUSUAL BRUISING OR BLEEDING *URINARY PROBLEMS (pain or burning when urinating, or frequent urination) *BOWEL PROBLEMS (unusual diarrhea, constipation, pain near the anus) TENDERNESS IN MOUTH AND THROAT WITH OR WITHOUT PRESENCE OF ULCERS (sore throat, sores in mouth, or a toothache) UNUSUAL RASH, SWELLING OR PAIN  UNUSUAL VAGINAL DISCHARGE OR ITCHING   Items with * indicate a potential emergency and should be followed up as soon as possible or go to the Emergency Department if any problems should occur.  Please show the CHEMOTHERAPY ALERT CARD or IMMUNOTHERAPY ALERT  CARD at check-in to the Emergency Department and triage nurse.  Should you have questions after your visit or need to cancel or reschedule your appointment, please contact [place] [facility]  Dept: [phone]  and follow the prompts.  Office hours are 8:00 a.m. to 4:30 p.m. Monday - Friday. Please note that voicemails left after 4:00 p.m. may not be returned until the following business day.  We are closed weekends and major holidays. You have access to a nurse at all times for urgent questions. Please call the main number to the clinic Dept: [phone] and follow the prompts.   For any non-urgent questions, you may also contact your provider using MyChart. We now offer e-Visits for anyone 28 and older to request care online for non-urgent symptoms. For details visit [email or web address].   Also download the MyChart app! Go to the app store, search "MyChart", open the app, select [place], and log in with your MyChart username and password.

## 2023-08-14 NOTE — Progress Notes (Signed)
[facility] [phone]   [phone]  OFFICE PROGRESS NOTE  [name], PA-C [address] [place] [address]  DIAGNOSIS:  Stage IV (T4, N2, M1 a) non-small cell lung cancer, adenocarcinoma presented with multifocal disease involving the right upper lobe, right lower lobe as well as suspicious lower paratracheal lymphadenopathy and groundglass nodules in the left lung diagnosed in May 2022. The patient had molecular studies performed by [facility] and that showed no detectable mutation but this is likely secondary to low-level circulating free tumor DNA.  PRIOR THERAPY: None.  CURRENT THERAPY: palliative systemic chemotherapy with carboplatin for AUC of 5, Alimta 500 Mg/M2 and Avastin 15 Mg/KG every 3 weeks.  The patient is not a great candidate for immunotherapy because of her history of active systemic lupus erythematosus.  Starting from cycle #7 the patient is on maintenance treatment with Alimta and Avastin every 3 weeks.  She is status post 37 cycles.  INTERVAL HISTORY: Kirsten Williams 52 y.o. female returns to the clinic today for follow-up visit.  The patient is feeling fine today with no concerning complaints except for few skin issues as well as suspicious cyst in the right shoulder area.  She has some dental work performed recently and she tolerated it fairly well.  She is expected to have a few crowns placed in the next several months.  She denied having any current chest pain, shortness of breath except with exertion with no cough or hemoptysis.  She has no nausea, vomiting, diarrhea or constipation.  She has no headache or visual changes.  She is here today for evaluation before starting cycle #38 of her treatment.  MEDICAL HISTORY: Past Medical History:  Diagnosis Date   Adenocarcinoma, lung, right (HCC) 05/03/2021   Kirsten Williams is a 52 y.o. female with a history of lung nodules dating back to 2019 who is referred in  consultation with [name], PA-C for assessment and management. She is a former smoker having quit in 2016. 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 She missed her screening CT last year and imaging was ordered last month. CT chest from 02-16-2021 reveals pro   Anxiety    GERD (gastroesophageal reflux disease)    SLE (systemic lupus erythematosus) (HCC) 05/03/2021   SLE (systemic lupus erythematosus) (HCC) 05/03/2021    ALLERGIES:  is allergic to clindamycin/lincomycin and carboplatin.  MEDICATIONS:  Current Outpatient Medications  Medication Sig Dispense Refill   amLODipine (NORVASC) 5 MG tablet Take 5 mg by mouth daily.     B Complex Vitamins (B COMPLEX 50 PO) Take by mouth.     Calcium Carbonate-Vit D-Min (CALCIUM 1200 PO) Take 1 capsule by mouth daily.     cholecalciferol (VITAMIN D3) 25 MCG (1000 UNIT) tablet Take 2,000 Units by mouth daily.     cyclobenzaprine (FLEXERIL) 10 MG tablet Take 10 mg by mouth at bedtime as needed for muscle spasms.     denosumab (PROLIA) 60 MG/ML SOSY injection Inject 60 mg into the skin every 6 (six) months.     dexamethasone (DECADRON) 4 MG tablet 4 mg p.o. twice daily the day before, day of and day after the chemotherapy every 3 weeks. 40 tablet 2   dicyclomine (BENTYL) 10 MG capsule Take 10 mg by mouth every 6 (six) hours as needed.     diphenhydrAMINE (BENADRYL) 25 MG tablet Take 25 mg by mouth daily as needed for allergies.  famotidine (PEPCID) 20 MG tablet Take 20 mg by mouth at bedtime.     fluticasone (FLONASE) 50 MCG/ACT nasal spray Place 2 sprays into both nostrils daily.     folic acid (FOLVITE) 1 MG tablet TAKE 1 TABLET(1 MG) BY MOUTH DAILY 30 tablet 4   gabapentin (NEURONTIN) 300 MG capsule Take 300 mg by mouth 3 (three) times daily.     hydrocortisone 1 % lotion Apply 1 application topically 2 (two) times daily. 118 mL 0   hydroxychloroquine (PLAQUENIL) 200 MG tablet Take 200 mg by mouth 2 (two) times daily.      Multiple Vitamins-Minerals (MULTI ADULT GUMMIES) CHEW Chew 2 tablets by mouth daily.     omeprazole (PRILOSEC) 40 MG capsule Take 40 mg by mouth daily as needed (acid reflux).     oxyCODONE-acetaminophen (PERCOCET/ROXICET) 5-325 MG tablet Take 1 tablet by mouth every 8 (eight) hours as needed for severe pain. 30 tablet 0   potassium chloride SA (KLOR-CON M) 20 MEQ tablet Take 1 tablet (20 mEq total) by mouth daily. 5 tablet 0   Probiotic Product (PROBIOTIC-10 PO) Take by mouth.     prochlorperazine (COMPAZINE) 10 MG tablet TAKE 1 TABLET(10 MG) BY MOUTH EVERY 6 HOURS AS NEEDED 30 tablet 2   rizatriptan (MAXALT-MLT) 10 MG disintegrating tablet Take 10 mg by mouth 2 (two) times daily as needed.     tetrahydrozoline 0.05 % ophthalmic solution Place 1 drop into both eyes once. Systane Eye Drops once daily both eyes     triamcinolone ointment (KENALOG) 0.5 % Apply topically 3 (three) times daily.     No current facility-administered medications for this visit.    SURGICAL HISTORY:  Past Surgical History:  Procedure Laterality Date   ABDOMINAL HYSTERECTOMY     BRONCHIAL BIOPSY  04/24/2021   Procedure: BRONCHIAL BIOPSIES;  Surgeon: Leslye Peer, MD;  Location: [facility] ENDOSCOPY;  Service: Pulmonary;;   BRONCHIAL BRUSHINGS  04/24/2021   Procedure: BRONCHIAL BRUSHINGS;  Surgeon: Leslye Peer, MD;  Location[facility] ENDOSCOPY;  Service: Pulmonary;;   BRONCHIAL NEEDLE ASPIRATION BIOPSY  04/24/2021   Procedure: BRONCHIAL NEEDLE ASPIRATION BIOPSIES;  Surgeon: Leslye Peer, MD;  Location: [facility] ENDOSCOPY;  Service: Pulmonary;;   BRONCHIAL WASHINGS  04/24/2021   Procedure: BRONCHIAL WASHINGS;  Surgeon: Leslye Peer, MD;  Location: [facility] ENDOSCOPY;  Service: Pulmonary;;   CESAREAN SECTION  11/09/1990   DILATION AND CURETTAGE OF UTERUS Bilateral 09/09/1996   FIDUCIAL MARKER PLACEMENT  04/24/2021   Procedure: FIDUCIAL MARKER PLACEMENT;  Surgeon: Leslye Peer, MD;  Location: [facility] ENDOSCOPY;  Service:  Pulmonary;;   TONSILLECTOMY  12/10/1977   VIDEO BRONCHOSCOPY WITH ENDOBRONCHIAL NAVIGATION Bilateral 04/24/2021   Procedure: VIDEO BRONCHOSCOPY WITH ENDOBRONCHIAL NAVIGATION;  Surgeon: Leslye Peer, MD;  Location: [facility] ENDOSCOPY;  Service: Pulmonary;  Laterality: Bilateral;    REVIEW OF SYSTEMS:  A comprehensive review of systems was negative except for: Respiratory: positive for dyspnea on exertion   PHYSICAL EXAMINATION: General appearance: alert, cooperative, and no distress Head: Normocephalic, without obvious abnormality, atraumatic Neck: no adenopathy, no JVD, supple, symmetrical, trachea midline, and thyroid not enlarged, symmetric, no tenderness/mass/nodules Lymph nodes: Cervical, supraclavicular, and axillary nodes normal. Resp: clear to auscultation bilaterally Back: symmetric, no curvature. ROM normal. No CVA tenderness. Cardio: regular rate and rhythm, S1, S2 normal, no murmur, click, rub or gallop GI: soft, non-tender; bowel sounds normal; no masses,  no organomegaly Extremities: extremities normal, atraumatic, no cyanosis or edema  ECOG PERFORMANCE STATUS: 1 - Symptomatic  but completely ambulatory  Blood pressure (!) 147/82, pulse 83, temperature 98.3 F (36.8 C), temperature source Oral, resp. rate 17, height 5\' 1"  (1.549 m), weight 110 lb 11.2 oz (50.2 kg), SpO2 100%.  LABORATORY DATA: Lab Results  Component Value Date   WBC 6.8 08/14/2023   HGB 12.8 08/14/2023   HCT 40.0 08/14/2023   MCV 99.3 08/14/2023   PLT 222 08/14/2023      Chemistry      Component Value Date/Time   NA 140 07/24/2023 0855   NA 140 03/13/2021 0000   K 3.4 (L) 07/24/2023 0855   CL 105 07/24/2023 0855   CO2 27 07/24/2023 0855   BUN 6 07/24/2023 0855   BUN 10 03/13/2021 0000   CREATININE 0.87 07/24/2023 0855   GLU 107 03/13/2021 0000      Component Value Date/Time   CALCIUM 9.4 07/24/2023 0855   ALKPHOS 116 07/24/2023 0855   AST 20 07/24/2023 0855   ALT 13 07/24/2023 0855   BILITOT  0.3 07/24/2023 0855       RADIOGRAPHIC STUDIES: CT CHEST ABDOMEN PELVIS W CONTRAST  Result Date: 07/18/2023 CLINICAL DATA:  Follow-up of stage IV lung adenocarcinoma. * Tracking Code: BO * EXAM: CT CHEST, ABDOMEN, AND PELVIS WITH CONTRAST TECHNIQUE: Multidetector CT imaging of the chest, abdomen and pelvis was performed following the standard protocol during bolus administration of intravenous contrast. RADIATION DOSE REDUCTION: This exam was performed according to the departmental dose-optimization program which includes automated exposure control, adjustment of the mA and/or kV according to patient size and/or use of iterative reconstruction technique. CONTRAST:  OMNIPAQUE IOHEXOL 300 MG/ML  SOLN COMPARISON:  05/07/2023 FINDINGS: CT CHEST FINDINGS Cardiovascular: Aortic atherosclerosis. Normal heart size, without pericardial effusion. No central pulmonary embolism, on this non-dedicated study. Mediastinum/Nodes: No supraclavicular adenopathy. 11 mm right paratracheal node on 21/2 is unchanged. No hilar adenopathy. Lungs/Pleura: No pleural fluid.  Mild centrilobular emphysema. Minimal biapical pleuroparenchymal scarring. Right lower lobe biopsy clips. Superior segment right lower lobe mixed attenuation nodule measures 2.4 x 1.6 cm on 60/6 versus 2.2 x 1.5 cm on the prior. Solid component laterally of 1.2 cm on 61/6 measured 1.2 cm on the prior (when remeasured). Posterior right upper lobe mixed attenuation nodule measures 1.4 cm on 34/6 and is similar. The posteromedial solid portion may be slightly more distinct today. Left upper lobe ground-glass nodule of 2.0 cm on 39/6 is unchanged. Other smaller ground-glass nodules are identified on series 6 and are unchanged. A 3 mm right upper lobe solid nodule is similar on 52/6. Musculoskeletal: No acute osseous abnormality. CT ABDOMEN PELVIS FINDINGS Hepatobiliary: Focal steatosis adjacent the falciform ligament. Normal gallbladder, without biliary ductal  dilatation. Pancreas: Normal, without mass or ductal dilatation. Spleen: Normal in size, without focal abnormality. Adrenals/Urinary Tract: Bilateral adrenal nodules are unchanged, including at up to 1.8 cm on the right and 2.1 cm on the left. Right extrarenal pelvis. Normal left kidney. No hydronephrosis. Normal urinary bladder. Stomach/Bowel: Proximal gastric underdistention. The cecum extends into the pelvis. Normal deep pelvic appendix and terminal ileum. Normal small bowel. Vascular/Lymphatic: Aortic atherosclerosis. No abdominopelvic adenopathy. Reproductive: Hysterectomy.  No adnexal mass. Other: No significant free fluid. Mild pelvic floor laxity. No evidence of omental or peritoneal disease. Musculoskeletal: No acute osseous abnormality. IMPRESSION: 1. Multiple subsolid pulmonary nodules are primarily similar as detailed above. Right upper lobe nodule may have increased soft tissue component within. Superior segment right lower lobe nodule has minimally enlarged. Potential clinical strategies include surveillance with  CT at 3-6 months versus PET to direct tissue sampling. 2. Similar mild mediastinal adenopathy. 3. No acute process or evidence of metastatic disease in the abdomen or pelvis. 4. Bilateral adrenal adenomas . In the absence of clinically indicated signs/symptoms require(s) no independent follow-up. 5. Aortic atherosclerosis (ICD10-I70.0) and emphysema (ICD10-J43.9). Electronically Signed   By: Jeronimo Greaves M.D.   On: 07/18/2023 14:08    ASSESSMENT AND PLAN:  This is a very pleasant 52 years old white female recently diagnosed with a stage IV non-small cell lung cancer, adenocarcinoma with no actionable mutation diagnosed in May 2022.  The patient has also history of systemic lupus erythematosus and she is not a candidate for immunotherapy. She is currently undergoing systemic chemotherapy with carboplatin for AUC of 5, Alimta 500 Mg/M2 and Avastin 15 Mg/KG every 3 weeks status post 37  cycles.  Starting from cycle #7 the patient is on maintenance treatment with Alimta and Avastin every 3 weeks. She has been tolerating her treatment fairly well with no concerning adverse effects. I recommended for her to proceed with cycle #38 today. I will see her back for follow-up visit in 3 weeks for evaluation before the next cycle of her treatment. For the dental work and crown placement, she was advised to do it the week before her chemotherapy. The patient was advised to call immediately if she has any other concerning symptoms in the interval. The patient voices understanding of current disease status and treatment options and is in agreement with the current care plan.  All questions were answered. The patient knows to call the clinic with any problems, questions or concerns. We can certainly see the patient much sooner if necessary. The total time spent in the appointment was 20 minutes.  Disclaimer: This note was dictated with voice recognition software. Similar sounding words can inadvertently be transcribed and may not be corrected upon review.

## 2023-08-14 NOTE — Progress Notes (Signed)
Patient seen by Dr. Clarnce Flock reviewed: and are not all within treatment parameters. Urine protein =100.  Per physician team, patient is ready for treatment and there are NO modifications to the treatment plan.  Per Dr. Arbutus Ped ,it is ok to treat pt today  with Alimta , Carboplatin and Bevacizumab and urine protein= 100 .

## 2023-08-26 IMAGING — CT CT ABD-PELV W/ CM
2 of 5 series · 11 of 36 positions shown, 13 images · IV contrast (omnipaque)
Comparison: 03/06/2021 PET-CT.  Most recent CT chest 04/18/2021.

CLINICAL DATA: Primary Cancer Type: Lung
TECHNIQUE: Multidetector CT imaging of the chest, abdomen and pelvis was
performed following the standard protocol during bolus
administration of intravenous contrast.

CONTRAST:  75mL OMNIPAQUE IOHEXOL 350 MG/ML SOLN

[Series 2: cap with · axial · 0.68mm/px · z∈[-554,-59]mm · 8 of 121 slices shown, 10 images]
[im 11/121  mediastinal]
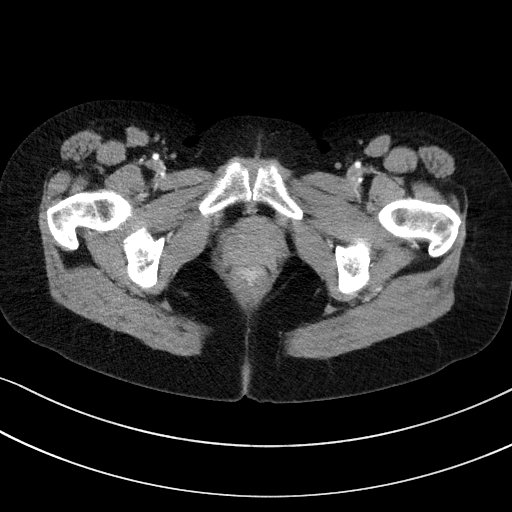
[im 11/121  lung]
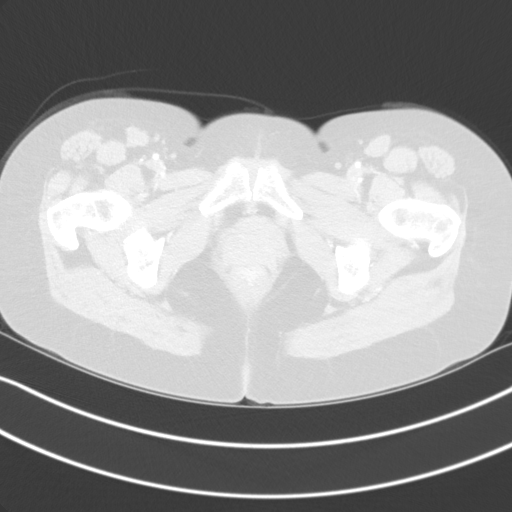
[im 22/121  lung]
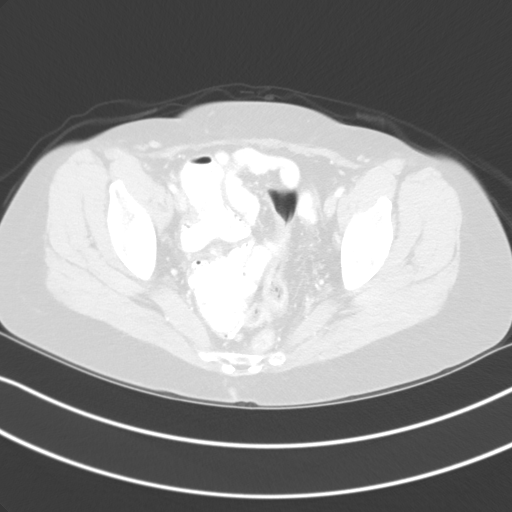
[im 44/121  lung]
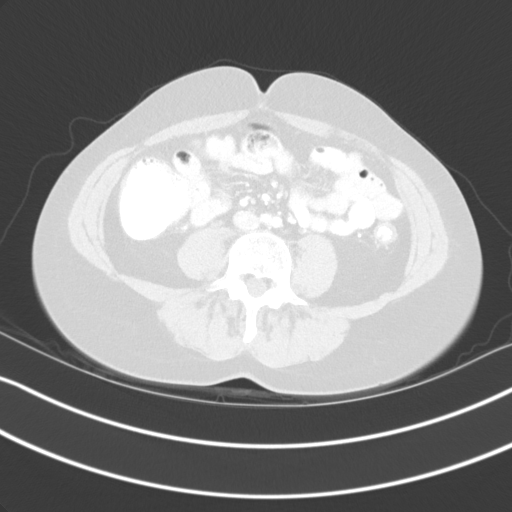
[im 55/121  lung]
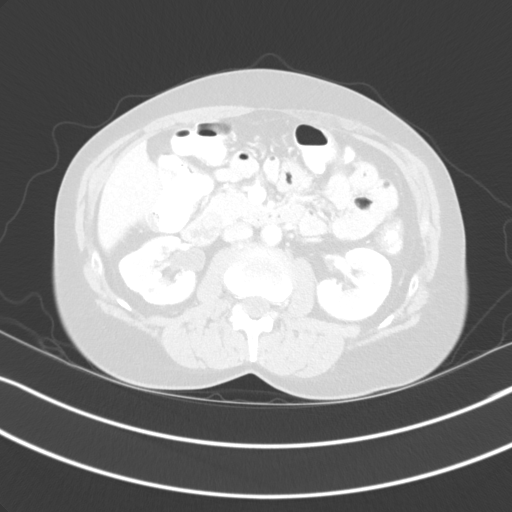
[im 66/121  mediastinal]
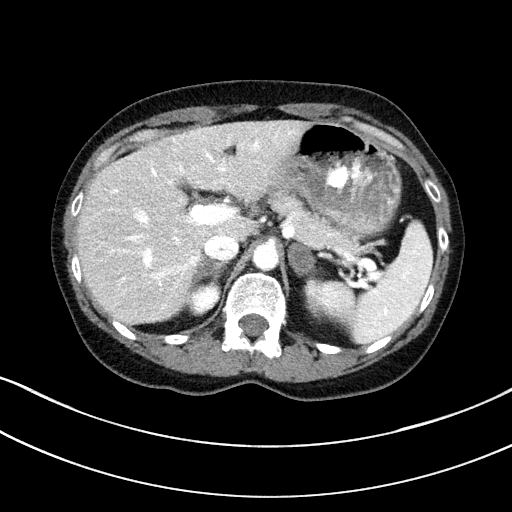
[im 66/121  lung]
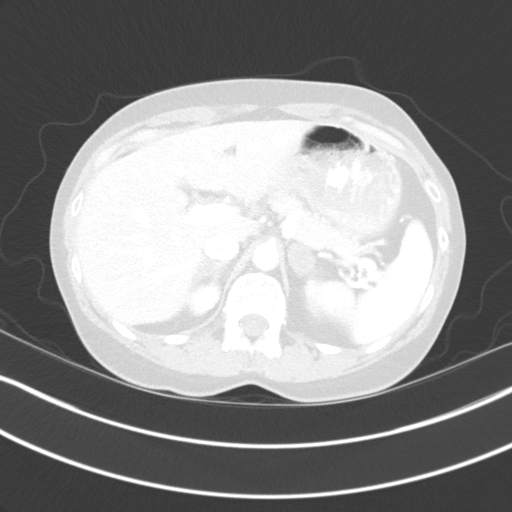
[im 77/121  lung]
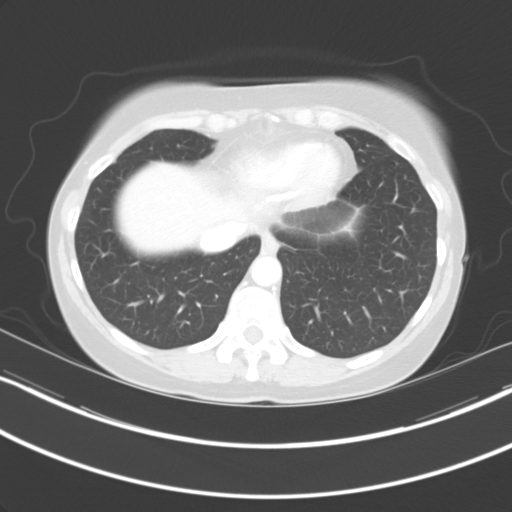
[im 99/121  lung]
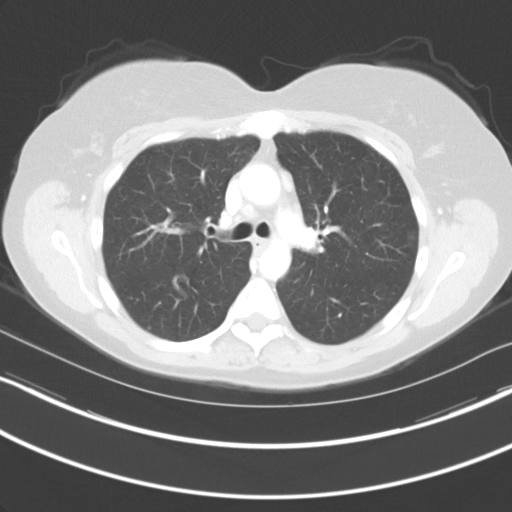
[im 110/121  lung]
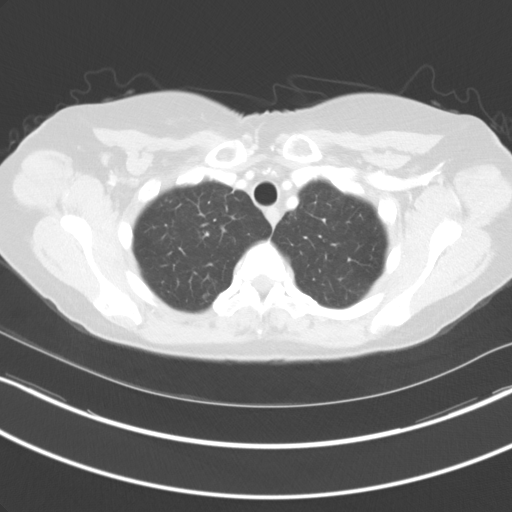

[Series 4: coronals · coronal · 0.62mm/px · 3 of 123 slices shown]
[im 25/123  lung]
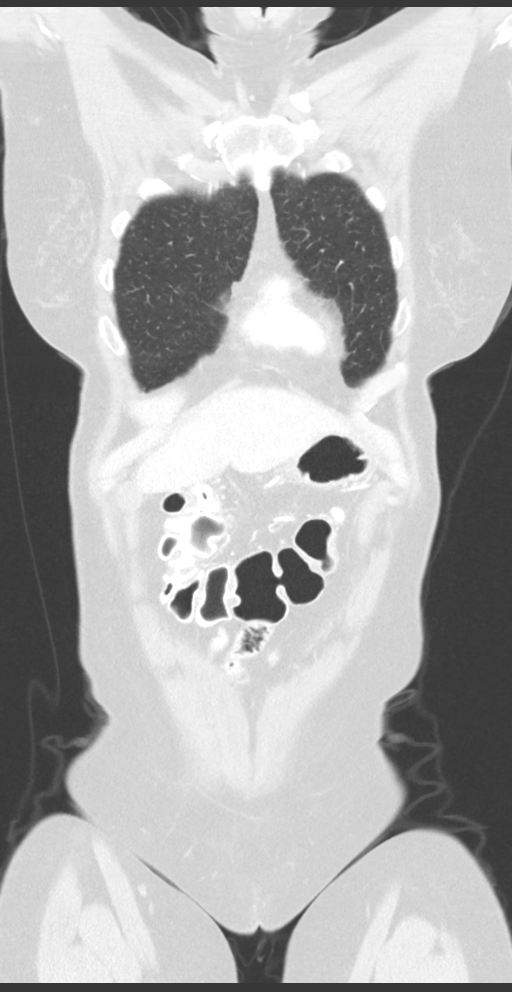
[im 49/123  lung]
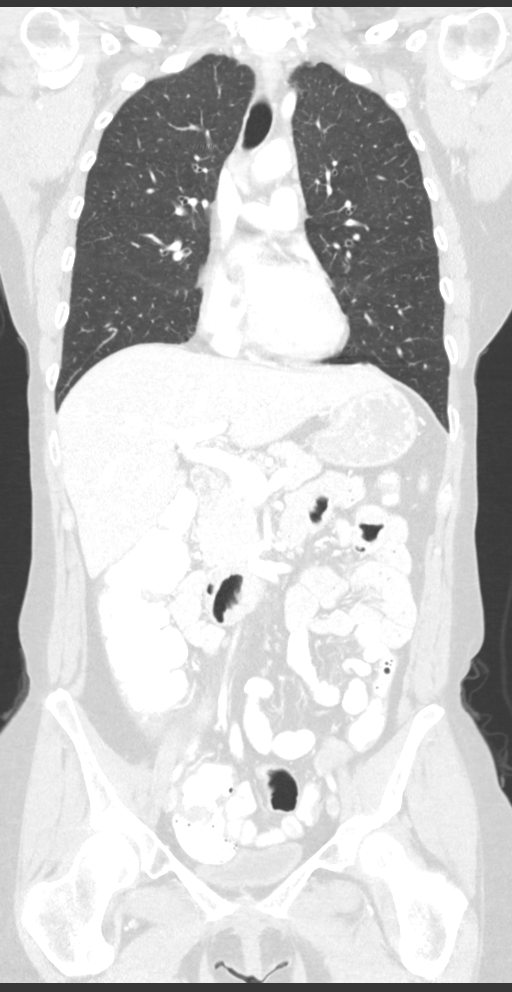
[im 74/123  lung]
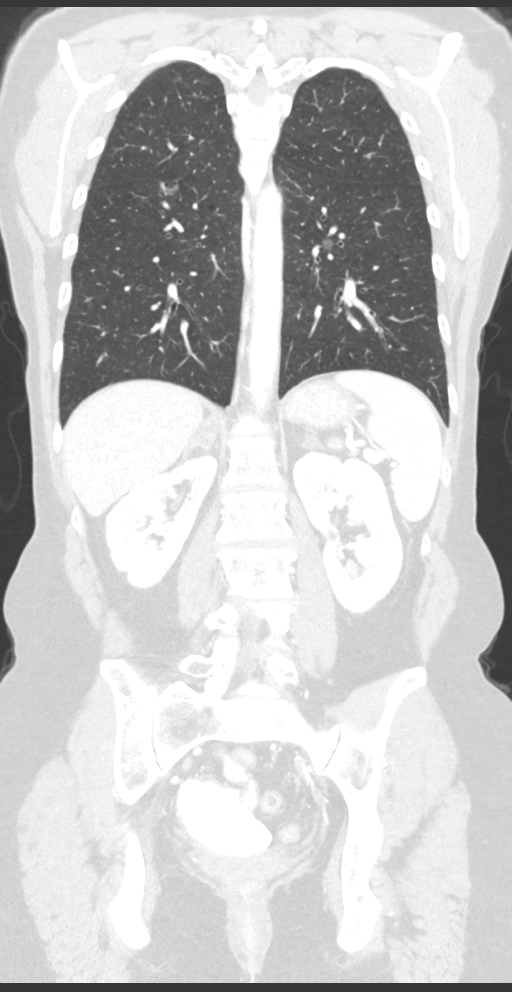

[11 of 36 positions shown; findings below may reference images not displayed]

Imaging Indication: Assess response to therapy

Interval therapy since last imaging? Yes

Initial Cancer Diagnosis

Date: 04/24/2021; Established by: Biopsy-proven

Detailed Pathology: Stage IV non-small cell lung cancer,
adenocarcinoma.

Primary Tumor location: Right upper and right lower lobe.

Surgeries: Hysterectomy.

Chemotherapy: Yes; Ongoing? Yes; Most recent administration:
08/02/2021

Immunotherapy? No

Radiation therapy? No

EXAM:
CT CHEST, ABDOMEN, AND PELVIS WITH CONTRAST
FINDINGS: CT CHEST FINDINGS

Cardiovascular: Normal heart size. No significant pericardial
effusion/thickening. Atherosclerotic nonaneurysmal thoracic aorta.
Normal caliber pulmonary arteries. No central pulmonary emboli.

Mediastinum/Nodes: No discrete thyroid nodules. Unremarkable
esophagus. No axillary adenopathy. Right paratracheal 0.9 cm
top-normal node (series 2/image 22), minimally decreased from
cm. No pathologically enlarged mediastinal or hilar nodes.

Lungs/Pleura: No pneumothorax. No pleural effusion. Sub solid 1.0 x
0.9 cm posterior right upper lobe nodule (series 6/image 31),
slightly decreased in size from 1.2 x 0.9 cm on 04/18/2021 chest CT
and slightly decreased in density. Sub solid superior segment right
lower lobe 1.6 x 1.2 cm nodule (series 6/image 56), slightly
decreased from 1.9 x 1.3 cm using similar measurement technique and
slightly decreased in density. Several ground-glass pulmonary
nodules scattered throughout the left lung, most prominent in the
left upper lobe, largest 2.1 cm in the peripheral left upper lobe
(series 6/image 30), previously 2.5 cm, mildly decreased. Biopsy
clips noted in the upper lobes bilaterally. No acute consolidative
airspace disease, lung masses or new significant pulmonary nodules.

Musculoskeletal: No aggressive appearing focal osseous lesions. Mild
thoracic spondylosis.

CT ABDOMEN PELVIS FINDINGS

Hepatobiliary: Normal liver with no liver mass. Normal gallbladder
with no radiopaque cholelithiasis. No biliary ductal dilatation.

Pancreas: Normal, with no mass or duct dilation.

Spleen: Normal size. No mass.

Adrenals/Urinary Tract: Stable 2.3 cm right adrenal and 2.3 cm left
adrenal nodules, previously characterized as benign adenomas. Normal
kidneys with no hydronephrosis and no renal mass. Normal bladder.

Stomach/Bowel: Normal non-distended stomach. Normal caliber small
bowel with no small bowel wall thickening. Normal appendix. Oral
contrast transits to the distal colon. Normal large bowel with no
diverticulosis, large bowel wall thickening or pericolonic fat
stranding.

Vascular/Lymphatic: Atherosclerotic nonaneurysmal abdominal aorta.
Patent portal, splenic, hepatic and renal veins. No pathologically
enlarged lymph nodes in the abdomen or pelvis.

Reproductive: Status post hysterectomy, with no abnormal findings at
the vaginal cuff. No adnexal mass.

Other: No pneumoperitoneum, ascites or focal fluid collection.

Musculoskeletal: No aggressive appearing focal osseous lesions. Mild
lumbar spondylosis.
IMPRESSION: 1. Subsolid right pulmonary nodules are mildly decreased in size and
density. Ground-glass left pulmonary nodules are mildly decreased in
size. Previously mildly enlarged right paratracheal lymph node is
slightly decreased. Findings are compatible with positive response
to therapy.
2. No new or progressive metastatic disease in the chest.
3. No evidence of metastatic disease in the abdomen or pelvis.
4. Stable bilateral adrenal adenomas.
5. Aortic Atherosclerosis ([id number]).

## 2023-09-03 MED FILL — Dexamethasone Sodium Phosphate Inj 100 MG/10ML: INTRAMUSCULAR | Qty: 1 | Status: AC

## 2023-09-04 ENCOUNTER — Inpatient Hospital Stay: Payer: Commercial Managed Care - PPO

## 2023-09-04 ENCOUNTER — Inpatient Hospital Stay: Payer: Commercial Managed Care - PPO | Admitting: Internal Medicine

## 2023-09-04 VITALS — BP 151/88 | HR 85 | Temp 97.5°F | Resp 17 | Ht 61.0 in | Wt 107.5 lb

## 2023-09-04 DIAGNOSIS — C3491 Malignant neoplasm of unspecified part of right bronchus or lung: Secondary | ICD-10-CM

## 2023-09-04 DIAGNOSIS — Z5112 Encounter for antineoplastic immunotherapy: Secondary | ICD-10-CM | POA: Diagnosis not present

## 2023-09-04 LAB — CMP (CANCER CENTER ONLY)
ALT: 14 U/L (ref 0–44)
AST: 19 U/L (ref 15–41)
Albumin: 4 g/dL (ref 3.5–5.0)
Alkaline Phosphatase: 112 U/L (ref 38–126)
Anion gap: 12 (ref 5–15)
BUN: 16 mg/dL (ref 6–20)
CO2: 24 mmol/L (ref 22–32)
Calcium: 10.2 mg/dL (ref 8.9–10.3)
Chloride: 103 mmol/L (ref 98–111)
Creatinine: 0.85 mg/dL (ref 0.44–1.00)
GFR, Estimated: >60 mL/min (ref >60)
Glucose, Bld: 141 mg/dL — ABNORMAL HIGH (ref 70–99)
Potassium: 3.5 mmol/L (ref 3.5–5.1)
Sodium: 139 mmol/L (ref 135–145)
Total Bilirubin: 0.4 mg/dL (ref 0.3–1.2)
Total Protein: 7.9 g/dL (ref 6.5–8.1)

## 2023-09-04 LAB — CBC WITH DIFFERENTIAL (CANCER CENTER ONLY)
Abs Immature Granulocytes: 0.03 10*3/uL (ref 0.00–0.07)
Basophils Absolute: 0 10*3/uL (ref 0.0–0.1)
Basophils Relative: 0 %
Eosinophils Absolute: 0 10*3/uL (ref 0.0–0.5)
Eosinophils Relative: 0 %
HCT: 37.6 % (ref 36.0–46.0)
Hemoglobin: 12.1 g/dL (ref 12.0–15.0)
Immature Granulocytes: 0 %
Lymphocytes Relative: 16 %
Lymphs Abs: 1.7 10*3/uL (ref 0.7–4.0)
MCH: 30.6 pg (ref 26.0–34.0)
MCHC: 32.2 g/dL (ref 30.0–36.0)
MCV: 95.2 fL (ref 80.0–100.0)
Monocytes Absolute: 1.1 10*3/uL — ABNORMAL HIGH (ref 0.1–1.0)
Monocytes Relative: 10 %
Neutro Abs: 7.7 10*3/uL (ref 1.7–7.7)
Neutrophils Relative %: 74 %
Platelet Count: 324 10*3/uL (ref 150–400)
RBC: 3.95 MIL/uL (ref 3.87–5.11)
RDW: 16.5 % — ABNORMAL HIGH (ref 11.5–15.5)
WBC Count: 10.6 10*3/uL — ABNORMAL HIGH (ref 4.0–10.5)
nRBC: 0 % (ref 0.0–0.2)

## 2023-09-04 LAB — TOTAL PROTEIN, URINE DIPSTICK: Protein, ur: 300 mg/dL — AB

## 2023-09-04 NOTE — Progress Notes (Signed)
[facility] [phone]   [phone]  OFFICE PROGRESS NOTE  [name], PA-C [address] [place] [address]  DIAGNOSIS:  Stage IV (T4, N2, M1 a) non-small cell lung cancer, adenocarcinoma presented with multifocal disease involving the right upper lobe, right lower lobe as well as suspicious lower paratracheal lymphadenopathy and groundglass nodules in the left lung diagnosed in May 2022. The patient had molecular studies performed by [facility] [employer] and that showed no detectable mutation but this is likely secondary to low-level circulating free tumor DNA.  PRIOR THERAPY: None.  CURRENT THERAPY: palliative systemic chemotherapy with carboplatin for AUC of 5, Alimta 500 Mg/M2 and Avastin 15 Mg/KG every 3 weeks.  The patient is not a great candidate for immunotherapy because of her history of active systemic lupus erythematosus.  Starting from cycle #7 the patient is on maintenance treatment with Alimta and Avastin every 3 weeks.  She is status post 38 cycles.  INTERVAL HISTORY: Kirsten Williams 52 y.o. female returns to the clinic today for follow-up visit.Discussed the use of AI scribe software for clinical note transcription with the patient, who gave verbal consent to proceed.  History of Present Illness   Kirsten Williams, a 52 year old patient with a history of stage four non-small cell lung cancer, adenocarcinoma, and gastrointestinal issues, presents with concerns about recent weight loss and changes in bowel habits. The patient has been under the care of a gastroenterologist, Dr. Braulio Conte, who recently expressed concern about the patient's weight loss. In an attempt to address this, the patient started a plant-based weight gainer product, which has led to increased frequency of bowel movements, up to five times a day, and a sensation of 'running off.'  The patient also reports a decrease in appetite over the past few weeks, managing  only one meal a day. Despite this, there has been a slight weight gain of three pounds over the past week. The patient also mentions a planned removal of a cyst on the right shoulder by Dr. Lynne Leader at [facility].  In addition to these concerns, the patient reports ongoing issues with sciatica, predominantly on the left side, which has been managed with stretching. The patient also describes a general feeling of malaise and fatigue over the past couple of weeks, with a lack of appetite and increased fluid intake. There have been instances of nausea, managed with medication, and a tightness below the breast area that wraps around the torso, which is a recurring symptom post-treatment.  The patient has been on a chemotherapy regimen since May 2022, initially with carboplatin, Alimta, and Avastin, and then Alimta and Avastin every three weeks since the seventh cycle. The patient has completed thirty-eight cycles in total. However, recent lab results show elevated urine protein levels, which may be related to the new plant-based diet or potential toxicity from Avastin.       MEDICAL HISTORY: Past Medical History:  Diagnosis Date   Adenocarcinoma, lung, right (HCC) 05/03/2021   Kirsten Williams is a 52 y.o. female with a history of lung nodules dating back to 2019 who is referred in consultation with [name], PA-C for assessment and management. She is a former smoker having quit in 2016. 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 She missed her screening CT last year and imaging was ordered last month. CT chest from 02-16-2021 reveals pro   Anxiety    GERD (gastroesophageal reflux disease)  SLE (systemic lupus erythematosus) (HCC) 05/03/2021   SLE (systemic lupus erythematosus) (HCC) 05/03/2021    ALLERGIES:  is allergic to clindamycin/lincomycin and carboplatin.  MEDICATIONS:  Current Outpatient Medications  Medication Sig Dispense Refill   amLODipine  (NORVASC) 5 MG tablet Take 5 mg by mouth daily.     B Complex Vitamins (B COMPLEX 50 PO) Take by mouth.     Calcium Carbonate-Vit D-Min (CALCIUM 1200 PO) Take 1 capsule by mouth daily.     cholecalciferol (VITAMIN D3) 25 MCG (1000 UNIT) tablet Take 2,000 Units by mouth daily.     cyclobenzaprine (FLEXERIL) 10 MG tablet Take 10 mg by mouth at bedtime as needed for muscle spasms.     denosumab (PROLIA) 60 MG/ML SOSY injection Inject 60 mg into the skin every 6 (six) months.     dexamethasone (DECADRON) 4 MG tablet 4 mg p.o. twice daily the day before, day of and day after the chemotherapy every 3 weeks. 40 tablet 2   dicyclomine (BENTYL) 10 MG capsule Take 10 mg by mouth every 6 (six) hours as needed.     diphenhydrAMINE (BENADRYL) 25 MG tablet Take 25 mg by mouth daily as needed for allergies.     famotidine (PEPCID) 20 MG tablet Take 20 mg by mouth at bedtime.     fluticasone (FLONASE) 50 MCG/ACT nasal spray Place 2 sprays into both nostrils daily.     folic acid (FOLVITE) 1 MG tablet TAKE 1 TABLET(1 MG) BY MOUTH DAILY 30 tablet 4   gabapentin (NEURONTIN) 300 MG capsule Take 300 mg by mouth 3 (three) times daily.     hydrocortisone 1 % lotion Apply 1 application topically 2 (two) times daily. 118 mL 0   hydroxychloroquine (PLAQUENIL) 200 MG tablet Take 200 mg by mouth 2 (two) times daily.     Multiple Vitamins-Minerals (MULTI ADULT GUMMIES) CHEW Chew 2 tablets by mouth daily.     omeprazole (PRILOSEC) 40 MG capsule Take 40 mg by mouth daily as needed (acid reflux).     oxyCODONE-acetaminophen (PERCOCET/ROXICET) 5-325 MG tablet Take 1 tablet by mouth every 8 (eight) hours as needed for severe pain. 30 tablet 0   potassium chloride SA (KLOR-CON M) 20 MEQ tablet Take 1 tablet (20 mEq total) by mouth daily. 5 tablet 0   Probiotic Product (PROBIOTIC-10 PO) Take by mouth.     prochlorperazine (COMPAZINE) 10 MG tablet TAKE 1 TABLET(10 MG) BY MOUTH EVERY 6 HOURS AS NEEDED 30 tablet 2   rizatriptan  (MAXALT-MLT) 10 MG disintegrating tablet Take 10 mg by mouth 2 (two) times daily as needed.     tetrahydrozoline 0.05 % ophthalmic solution Place 1 drop into both eyes once. Systane Eye Drops once daily both eyes     triamcinolone ointment (KENALOG) 0.5 % Apply topically 3 (three) times daily.     No current facility-administered medications for this visit.    SURGICAL HISTORY:  Past Surgical History:  Procedure Laterality Date   ABDOMINAL HYSTERECTOMY     BRONCHIAL BIOPSY  04/24/2021   Procedure: BRONCHIAL BIOPSIES;  Surgeon: Leslye Peer, MD;  Location: [facility] ENDOSCOPY;  Service: Pulmonary;;   BRONCHIAL BRUSHINGS  04/24/2021   Procedure: BRONCHIAL BRUSHINGS;  Surgeon: Leslye Peer, MD;  Location: [facility] ENDOSCOPY;  Service: Pulmonary;;   BRONCHIAL NEEDLE ASPIRATION BIOPSY  04/24/2021   Procedure: BRONCHIAL NEEDLE ASPIRATION BIOPSIES;  Surgeon: Leslye Peer, MD;  Location: [facility] ENDOSCOPY;  Service: Pulmonary;;   BRONCHIAL WASHINGS  04/24/2021   Procedure: BRONCHIAL WASHINGS;  Surgeon: Leslye Peer, MD;  Location: [facility] ENDOSCOPY;  Service: Pulmonary;;   CESAREAN SECTION  11/09/1990   DILATION AND CURETTAGE OF UTERUS Bilateral 09/09/1996   FIDUCIAL MARKER PLACEMENT  04/24/2021   Procedure: FIDUCIAL MARKER PLACEMENT;  Surgeon: Leslye Peer, MD;  Location: [facility] ENDOSCOPY;  Service: Pulmonary;;   TONSILLECTOMY  12/10/1977   VIDEO BRONCHOSCOPY WITH ENDOBRONCHIAL NAVIGATION Bilateral 04/24/2021   Procedure: VIDEO BRONCHOSCOPY WITH ENDOBRONCHIAL NAVIGATION;  Surgeon: Leslye Peer, MD;  Location: [facility] ENDOSCOPY;  Service: Pulmonary;  Laterality: Bilateral;    REVIEW OF SYSTEMS:  Constitutional: positive for fatigue Eyes: negative Ears, nose, mouth, throat, and face: negative Respiratory: negative Cardiovascular: negative Gastrointestinal: positive for nausea Genitourinary:negative Integument/breast: negative Hematologic/lymphatic: negative Musculoskeletal:positive for  arthralgias Neurological: negative Behavioral/Psych: negative Endocrine: negative Allergic/Immunologic: negative   PHYSICAL EXAMINATION: General appearance: alert, cooperative, fatigued, and no distress Head: Normocephalic, without obvious abnormality, atraumatic Neck: no adenopathy, no JVD, supple, symmetrical, trachea midline, and thyroid not enlarged, symmetric, no tenderness/mass/nodules Lymph nodes: Cervical, supraclavicular, and axillary nodes normal. Resp: clear to auscultation bilaterally Back: symmetric, no curvature. ROM normal. No CVA tenderness. Cardio: regular rate and rhythm, S1, S2 normal, no murmur, click, rub or gallop GI: soft, non-tender; bowel sounds normal; no masses,  no organomegaly Extremities: extremities normal, atraumatic, no cyanosis or edema Neurologic: Alert and oriented X 3, normal strength and tone. Normal symmetric reflexes. Normal coordination and gait  ECOG PERFORMANCE STATUS: 1 - Symptomatic but completely ambulatory  Blood pressure (!) 151/88, pulse 85, temperature (!) 97.5 F (36.4 C), temperature source Oral, resp. rate 17, height 5\' 1"  (1.549 m), weight 107 lb 8 oz (48.8 kg), SpO2 100%.  LABORATORY DATA: Lab Results  Component Value Date   WBC 6.8 08/14/2023   HGB 12.8 08/14/2023   HCT 40.0 08/14/2023   MCV 99.3 08/14/2023   PLT 222 08/14/2023      Chemistry      Component Value Date/Time   NA 139 08/14/2023 1020   NA 140 03/13/2021 0000   K 3.7 08/14/2023 1020   CL 103 08/14/2023 1020   CO2 25 08/14/2023 1020   BUN 8 08/14/2023 1020   BUN 10 03/13/2021 0000   CREATININE 0.93 08/14/2023 1020   GLU 107 03/13/2021 0000      Component Value Date/Time   CALCIUM 9.5 08/14/2023 1020   ALKPHOS 112 08/14/2023 1020   AST 24 08/14/2023 1020   ALT 12 08/14/2023 1020   BILITOT 0.3 08/14/2023 1020       RADIOGRAPHIC STUDIES: No results found.  ASSESSMENT AND PLAN:  This is a very pleasant 52 years old white female recently  diagnosed with a stage IV non-small cell lung cancer, adenocarcinoma with no actionable mutation diagnosed in May 2022.  The patient has also history of systemic lupus erythematosus and she is not a candidate for immunotherapy. She is currently undergoing systemic chemotherapy with carboplatin for AUC of 5, Alimta 500 Mg/M2 and Avastin 15 Mg/KG every 3 weeks status post 38 cycles.  Starting from cycle #7 the patient is on maintenance treatment with Alimta and Avastin every 3 weeks. Assessment and Plan    Stage IV Non-Small Cell Lung Cancer (Adenocarcinoma) Patient has completed 38 cycles of Alimta and Avastin. Recent urine protein elevated at 300, possibly due to Avastin toxicity or recent dietary changes. Patient reports feeling "blah" with decreased appetite and some nausea. -Delay chemotherapy by one week and recheck urine protein. -If proteinuria persists, consider holding Avastin and continue Alimta alone. -Advise  patient to modify diet, avoiding the plant-based protein supplement due to potential contribution to proteinuria.  Weight Loss Patient has lost almost 20 pounds over the past year. Recent attempt to gain weight with a plant-based protein supplement may be contributing to proteinuria and diarrhea. -Advise patient to discontinue plant-based protein supplement. -Encourage more frequent meals and snacks to promote weight gain.  Right Shoulder Cyst Planned removal by Dr. Lynne Leader at [facility] in November. -Continue to coordinate with insurance and schedule surgery.  Sciatica (Left) Managed with stretching exercises. -Continue current management.  Follow-up in one week to recheck urine protein and potentially proceed with chemotherapy.   She was advised to call immediately if she has any other concerning symptoms in the interval. The patient voices understanding of current disease status and treatment options and is in agreement with the current care plan.  All questions  were answered. The patient knows to call the clinic with any problems, questions or concerns. We can certainly see the patient much sooner if necessary. The total time spent in the appointment was 30 minutes.  Disclaimer: This note was dictated with voice recognition software. Similar sounding words can inadvertently be transcribed and may not be corrected upon review.

## 2023-09-05 ENCOUNTER — Telehealth: Payer: Self-pay | Admitting: Internal Medicine

## 2023-09-10 ENCOUNTER — Inpatient Hospital Stay: Payer: Commercial Managed Care - PPO

## 2023-09-10 ENCOUNTER — Inpatient Hospital Stay: Payer: Commercial Managed Care - PPO | Attending: Hematology and Oncology

## 2023-09-10 VITALS — BP 130/79 | HR 86 | Temp 97.7°F | Resp 17 | Wt 109.5 lb

## 2023-09-10 DIAGNOSIS — R112 Nausea with vomiting, unspecified: Secondary | ICD-10-CM | POA: Insufficient documentation

## 2023-09-10 DIAGNOSIS — C3491 Malignant neoplasm of unspecified part of right bronchus or lung: Secondary | ICD-10-CM

## 2023-09-10 DIAGNOSIS — C3411 Malignant neoplasm of upper lobe, right bronchus or lung: Secondary | ICD-10-CM | POA: Insufficient documentation

## 2023-09-10 DIAGNOSIS — Z5112 Encounter for antineoplastic immunotherapy: Secondary | ICD-10-CM | POA: Diagnosis present

## 2023-09-10 DIAGNOSIS — F419 Anxiety disorder, unspecified: Secondary | ICD-10-CM | POA: Diagnosis not present

## 2023-09-10 DIAGNOSIS — Z5111 Encounter for antineoplastic chemotherapy: Secondary | ICD-10-CM | POA: Insufficient documentation

## 2023-09-10 DIAGNOSIS — Z79899 Other long term (current) drug therapy: Secondary | ICD-10-CM | POA: Diagnosis not present

## 2023-09-10 LAB — CMP (CANCER CENTER ONLY)
ALT: 10 U/L (ref 0–44)
AST: 21 U/L (ref 15–41)
Albumin: 3.9 g/dL (ref 3.5–5.0)
Alkaline Phosphatase: 101 U/L (ref 38–126)
Anion gap: 10 (ref 5–15)
BUN: 6 mg/dL (ref 6–20)
CO2: 27 mmol/L (ref 22–32)
Calcium: 9 mg/dL (ref 8.9–10.3)
Chloride: 104 mmol/L (ref 98–111)
Creatinine: 0.78 mg/dL (ref 0.44–1.00)
GFR, Estimated: >60 mL/min (ref >60)
Glucose, Bld: 107 mg/dL — ABNORMAL HIGH (ref 70–99)
Potassium: 3 mmol/L — ABNORMAL LOW (ref 3.5–5.1)
Sodium: 141 mmol/L (ref 135–145)
Total Bilirubin: 0.3 mg/dL (ref 0.3–1.2)
Total Protein: 7.4 g/dL (ref 6.5–8.1)

## 2023-09-10 LAB — CBC WITH DIFFERENTIAL (CANCER CENTER ONLY)
Abs Immature Granulocytes: 0.06 10*3/uL (ref 0.00–0.07)
Basophils Absolute: 0.1 10*3/uL (ref 0.0–0.1)
Basophils Relative: 1 %
Eosinophils Absolute: 0.2 10*3/uL (ref 0.0–0.5)
Eosinophils Relative: 2 %
HCT: 41.8 % (ref 36.0–46.0)
Hemoglobin: 13.1 g/dL (ref 12.0–15.0)
Immature Granulocytes: 1 %
Lymphocytes Relative: 14 %
Lymphs Abs: 1.5 10*3/uL (ref 0.7–4.0)
MCH: 31.3 pg (ref 26.0–34.0)
MCHC: 31.3 g/dL (ref 30.0–36.0)
MCV: 99.8 fL (ref 80.0–100.0)
Monocytes Absolute: 0.4 10*3/uL (ref 0.1–1.0)
Monocytes Relative: 4 %
Neutro Abs: 8.1 10*3/uL — ABNORMAL HIGH (ref 1.7–7.7)
Neutrophils Relative %: 78 %
Platelet Count: 258 10*3/uL (ref 150–400)
RBC: 4.19 MIL/uL (ref 3.87–5.11)
RDW: 16.9 % — ABNORMAL HIGH (ref 11.5–15.5)
WBC Count: 10.2 10*3/uL (ref 4.0–10.5)
nRBC: 0 % (ref 0.0–0.2)

## 2023-09-10 LAB — TOTAL PROTEIN, URINE DIPSTICK: Protein, ur: 30 mg/dL — AB

## 2023-09-10 MED ORDER — PROCHLORPERAZINE MALEATE 10 MG PO TABS
10.0000 mg | ORAL_TABLET | Freq: Once | ORAL | Status: AC
  Administered 2023-09-10: 10 mg via ORAL
  Filled 2023-09-10: qty 1

## 2023-09-10 MED ORDER — SODIUM CHLORIDE 0.9 % IV SOLN
500.0000 mg/m2 | Freq: Once | INTRAVENOUS | Status: AC
  Administered 2023-09-10: 800 mg via INTRAVENOUS
  Filled 2023-09-10: qty 20

## 2023-09-10 MED ORDER — SODIUM CHLORIDE 0.9 % IV SOLN
15.0000 mg/kg | Freq: Once | INTRAVENOUS | Status: AC
  Administered 2023-09-10: 800 mg via INTRAVENOUS
  Filled 2023-09-10: qty 32

## 2023-09-10 MED ORDER — SODIUM CHLORIDE 0.9 % IV SOLN
10.0000 mg | Freq: Once | INTRAVENOUS | Status: AC
  Administered 2023-09-10: 10 mg via INTRAVENOUS
  Filled 2023-09-10: qty 10

## 2023-09-10 MED ORDER — SODIUM CHLORIDE 0.9 % IV SOLN: Freq: Once | INTRAVENOUS | Status: AC

## 2023-09-10 NOTE — Patient Instructions (Addendum)
[facility]  Discharge Instructions: Thank you for choosing [facility] to provide your oncology and hematology care.   If you have a lab appointment with the Cancer Center, please go directly to the Cancer Center and check in at the registration area.   Wear comfortable clothing and clothing appropriate for easy access to any Portacath or PICC line.   We strive to give you quality time with your provider. You may need to reschedule your appointment if you arrive late (15 or more minutes).  Arriving late affects you and other patients whose appointments are after yours.  Also, if you miss three or more appointments without notifying the office, you may be dismissed from the clinic at the provider's discretion.      For prescription refill requests, have your pharmacy contact our office and allow 72 hours for refills to be completed.    Today you received the following chemotherapy and/or immunotherapy agents: bevacizumab and pemetrexed      To help prevent nausea and vomiting after your treatment, we encourage you to take your nausea medication as directed.  BELOW ARE SYMPTOMS THAT SHOULD BE REPORTED IMMEDIATELY: *FEVER GREATER THAN 100.4 F (38 C) OR HIGHER *CHILLS OR SWEATING *NAUSEA AND VOMITING THAT IS NOT CONTROLLED WITH YOUR NAUSEA MEDICATION *UNUSUAL SHORTNESS OF BREATH *UNUSUAL BRUISING OR BLEEDING *URINARY PROBLEMS (pain or burning when urinating, or frequent urination) *BOWEL PROBLEMS (unusual diarrhea, constipation, pain near the anus) TENDERNESS IN MOUTH AND THROAT WITH OR WITHOUT PRESENCE OF ULCERS (sore throat, sores in mouth, or a toothache) UNUSUAL RASH, SWELLING OR PAIN  UNUSUAL VAGINAL DISCHARGE OR ITCHING   Items with * indicate a potential emergency and should be followed up as soon as possible or go to the Emergency Department if any problems should occur.  Please show the CHEMOTHERAPY ALERT CARD or IMMUNOTHERAPY  ALERT CARD at check-in to the Emergency Department and triage nurse.  Should you have questions after your visit or need to cancel or reschedule your appointment, please contact [facility]  Dept: [phone]  and follow the prompts.  Office hours are 8:00 a.m. to 4:30 p.m. Monday - Friday. Please note that voicemails left after 4:00 p.m. may not be returned until the following business day.  We are closed weekends and major holidays. You have access to a nurse at all times for urgent questions. Please call the main number to the clinic Dept: [phone] and follow the prompts.   For any non-urgent questions, you may also contact your provider using MyChart. We now offer e-Visits for anyone 51 and older to request care online for non-urgent symptoms. For details visit [email or web address].   Also download the MyChart app! Go to the app store, search "MyChart", open the app, select [place], and log in with your MyChart username and password.

## 2023-09-10 NOTE — Progress Notes (Signed)
Dr Arbutus Ped aware of pt potassium 3.0 mmol/L. Pt educated on potassium rich diet. Pt verbalized understanding.

## 2023-09-18 ENCOUNTER — Other Ambulatory Visit: Payer: Self-pay

## 2023-09-25 ENCOUNTER — Ambulatory Visit: Payer: Commercial Managed Care - PPO | Admitting: Internal Medicine

## 2023-09-25 ENCOUNTER — Ambulatory Visit: Payer: Commercial Managed Care - PPO | Admitting: Neurology

## 2023-09-25 ENCOUNTER — Ambulatory Visit: Payer: Commercial Managed Care - PPO

## 2023-09-25 ENCOUNTER — Other Ambulatory Visit: Payer: Commercial Managed Care - PPO

## 2023-09-26 NOTE — Progress Notes (Signed)
[facility] OFFICE PROGRESS NOTE  [name], PA-C [address] [place] [address]  DIAGNOSIS: Stage IV (T4, N2, M1 a) non-small cell lung cancer, adenocarcinoma presented with multifocal disease involving the right upper lobe, right lower lobe as well as suspicious lower paratracheal lymphadenopathy and groundglass nodules in the left lung diagnosed in May 2022. The patient had molecular studies performed by [facility] and that showed no detectable mutation but this is likely secondary to low-level circulating free tumor DNA.    PRIOR THERAPY: None  CURRENT THERAPY:  Palliative systemic chemotherapy with carboplatin for AUC of 5, Alimta 500 Mg/M2 and Avastin 15 Mg/KG every 3 weeks.  The patient is not a great candidate for immunotherapy in the event that she has a genetic mutation.  She is status post 38 cycles. Starting from cycle #6, the patient will start maintenance Alimta and Avastin.    INTERVAL HISTORY: MARYETTE WOLBERT 52 y.o. female returns to the clinic today for a follow-up visit.  The patient was last seen 3 weeks ago by Dr. Arbutus Ped.  She typically tolerates treatment well except she does experience a bandlike sensation around her diaphragm following treatment which is unchanged.  However she did have a little bit more side effects with her most recent cycle of chemotherapy the week after treatment.  She had a little more back pain, stomach upset, and decreased appetite.  She did eventually recover and she felt well earlier this week.  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  They are going to see her in 6 months and may consider her for an MRI.  However she states that they changed her gabapentin to Cymbalta.  She took 1 dose of Cymbalta Monday night and had nausea and vomiting and sensation of her tongue being "itchy".  Therefore, she stopped taking Cymbalta and feels back to her baseline at this time.  She denies any fever.  She  has intermittent night sweats which is not new.  She denies any unusual diarrhea or constipation.  Denies any significant shortness of breath unless with strenuous activity.  She denies any significant cough unless she needs to clear her throat.  She does feel like she is needing to clear her throat a little more frequently though.  She denies any chest pain or hemoptysis at this time.  Denies any abnormal bleeding or bruising.  She completed physical therapy and is trying to stay active with stretching and using her stationary bike.  She also saw her eye doctor recently and there were no concerns except age-related vision changes and she is expected to get 2 new pairs of glasses soon.  She is here for evaluation and repeat blood work before starting cycle #39.    MEDICAL HISTORY: Past Medical History:  Diagnosis Date   Adenocarcinoma, lung, right (HCC) 05/03/2021   BRENDER HEWGLEY is a 52 y.o. female with a history of lung nodules dating back to 2019 who is referred in consultation with [name], PA-C for assessment and management. She is a former smoker having quit in 2016. 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 She missed her screening CT last year and imaging was ordered last month. CT chest from 02-16-2021 reveals pro   Anxiety    GERD (gastroesophageal reflux disease)    SLE (systemic lupus erythematosus) (HCC) 05/03/2021   SLE (systemic lupus erythematosus) (HCC) 05/03/2021    ALLERGIES:  is allergic to  clindamycin/lincomycin and carboplatin.  MEDICATIONS:  Current Outpatient Medications  Medication Sig Dispense Refill   amLODipine (NORVASC) 5 MG tablet Take 5 mg by mouth daily.     B Complex Vitamins (B COMPLEX 50 PO) Take by mouth.     Calcium Carbonate-Vit D-Min (CALCIUM 1200 PO) Take 1 capsule by mouth daily.     cholecalciferol (VITAMIN D3) 25 MCG (1000 UNIT) tablet Take 2,000 Units by mouth daily.     cyclobenzaprine (FLEXERIL) 10 MG tablet Take  10 mg by mouth at bedtime as needed for muscle spasms.     denosumab (PROLIA) 60 MG/ML SOSY injection Inject 60 mg into the skin every 6 (six) months.     dexamethasone (DECADRON) 4 MG tablet 4 mg p.o. twice daily the day before, day of and day after the chemotherapy every 3 weeks. 40 tablet 2   dicyclomine (BENTYL) 10 MG capsule Take 10 mg by mouth every 6 (six) hours as needed.     diphenhydrAMINE (BENADRYL) 25 MG tablet Take 25 mg by mouth daily as needed for allergies.     DULoxetine (CYMBALTA) 30 MG capsule Take 1 capsule (30 mg total) by mouth at bedtime. 30 capsule 3   famotidine (PEPCID) 20 MG tablet Take 20 mg by mouth at bedtime.     fluticasone (FLONASE) 50 MCG/ACT nasal spray Place 2 sprays into both nostrils daily.     folic acid (FOLVITE) 1 MG tablet TAKE 1 TABLET(1 MG) BY MOUTH DAILY 30 tablet 4   gabapentin (NEURONTIN) 300 MG capsule Take 300 mg by mouth 3 (three) times daily. Patient takes once or twice a day.     hydrocortisone 1 % lotion Apply 1 application topically 2 (two) times daily. 118 mL 0   hydroxychloroquine (PLAQUENIL) 200 MG tablet Take 200 mg by mouth 2 (two) times daily.     Multiple Vitamins-Minerals (MULTI ADULT GUMMIES) CHEW Chew 2 tablets by mouth daily.     omeprazole (PRILOSEC) 40 MG capsule Take 40 mg by mouth daily as needed (acid reflux).     oxyCODONE-acetaminophen (PERCOCET/ROXICET) 5-325 MG tablet Take 1 tablet by mouth every 8 (eight) hours as needed for severe pain. 30 tablet 0   potassium chloride SA (KLOR-CON M) 20 MEQ tablet Take 1 tablet (20 mEq total) by mouth daily. 5 tablet 0   Probiotic Product (PROBIOTIC-10 PO) Take by mouth.     prochlorperazine (COMPAZINE) 10 MG tablet TAKE 1 TABLET(10 MG) BY MOUTH EVERY 6 HOURS AS NEEDED 30 tablet 2   rizatriptan (MAXALT-MLT) 10 MG disintegrating tablet Take 10 mg by mouth 2 (two) times daily as needed.     tetrahydrozoline 0.05 % ophthalmic solution Place 1 drop into both eyes once. Systane Eye Drops once  daily both eyes     triamcinolone ointment (KENALOG) 0.5 % Apply topically 3 (three) times daily.     No current facility-administered medications for this visit.    SURGICAL HISTORY:  Past Surgical History:  Procedure Laterality Date   ABDOMINAL HYSTERECTOMY     BRONCHIAL BIOPSY  04/24/2021   Procedure: BRONCHIAL BIOPSIES;  Surgeon: Leslye Peer, MD;  Location[facility] ENDOSCOPY;  Service: Pulmonary;;   BRONCHIAL BRUSHINGS  04/24/2021   Procedure: BRONCHIAL BRUSHINGS;  Surgeon: Leslye Peer, MD;  Location: [facility] ENDOSCOPY;  Service: Pulmonary;;   BRONCHIAL NEEDLE ASPIRATION BIOPSY  04/24/2021   Procedure: BRONCHIAL NEEDLE ASPIRATION BIOPSIES;  Surgeon: Leslye Peer, MD;  Location: [facility] ENDOSCOPY;  Service: Pulmonary;;   BRONCHIAL WASHINGS  04/24/2021  Procedure: BRONCHIAL WASHINGS;  Surgeon: Leslye Peer, MD;  Location: [facility] ENDOSCOPY;  Service: Pulmonary;;   CESAREAN SECTION  11/09/1990   DILATION AND CURETTAGE OF UTERUS Bilateral 09/09/1996   FIDUCIAL MARKER PLACEMENT  04/24/2021   Procedure: FIDUCIAL MARKER PLACEMENT;  Surgeon: Leslye Peer, MD;  Location: [facility] ENDOSCOPY;  Service: Pulmonary;;   TONSILLECTOMY  12/10/1977   VIDEO BRONCHOSCOPY WITH ENDOBRONCHIAL NAVIGATION Bilateral 04/24/2021   Procedure: VIDEO BRONCHOSCOPY WITH ENDOBRONCHIAL NAVIGATION;  Surgeon: Leslye Peer, MD;  Location: [facility] ENDOSCOPY;  Service: Pulmonary;  Laterality: Bilateral;    REVIEW OF SYSTEMS:   Constitutional: Positive for fatigue 1 week following treatment typically and some decreased appetite a few days following treatment. Positive for few pound weight loss. Negative for  chills and  fever. HENT: Negative for mouth sores, nosebleeds, sore throat and trouble swallowing.   Eyes: Positive for some blurry vision and is expected to follow-up with her eye doctor soon. Negative for eye problems and icterus.  Respiratory: Positive for stable intermittent shortness of breath with certain activities. Negative  for hemoptysis, cough, and wheezing.  Cardiovascular: Negative for chest pain and leg swelling.  Gastrointestinal: positive for nausea/vomiting earlier this week after taking cymbalta (resolved at this time). Negative for abdominal pain, diarrhea, constipation. Genitourinary: Negative for bladder incontinence, difficulty urinating, dysuria, frequency and hematuria.   Musculoskeletal: Positive for back pain following treatment. Negative for  gait problem, neck pain and neck stiffness.  Skin: negative for rash or itching.  Neurological: Negative for dizziness, headaches, extremity weakness, gait problem, light-headedness and seizures.  Hematological: Negative for adenopathy. Does not bruise/bleed easily.  Psychiatric/Behavioral: Negative for confusion, depression and sleep disturbance. The patient is not nervous/anxious.   PHYSICAL EXAMINATION:  Blood pressure (!) 149/100, pulse 81, temperature 98.1 F (36.7 C), temperature source Oral, resp. rate 16, weight 107 lb 3.2 oz (48.6 kg), SpO2 100%.  ECOG PERFORMANCE STATUS: 1  Physical Exam  Constitutional: Oriented to person, place, and time and thin appearing female, and in no distress.  HENT:  Head: Normocephalic and atraumatic.  Mouth/Throat: Oropharynx is clear and moist. No oropharyngeal exudate.  Eyes: Conjunctivae are normal. Right eye exhibits no discharge. Left eye exhibits no discharge. No scleral icterus.  Neck: Normal range of motion. Neck supple.  Cardiovascular: Normal rate, regular rhythm, normal heart sounds and intact distal pulses.   Pulmonary/Chest: Effort normal and breath sounds normal. No respiratory distress. No wheezes. No rales.  Abdominal: Soft. Bowel sounds are normal. Exhibits no distension and no mass. There is no tenderness.  Musculoskeletal: Normal range of motion. Exhibits no edema.  Lymphadenopathy:    No cervical adenopathy.  Neurological: Alert and oriented to person, place, and time. Exhibits normal muscle  tone. Gait normal. Coordination normal.  Skin: Skin is warm and dry. No rash noted. Not diaphoretic. No erythema. No pallor.  Psychiatric: Mood, memory and judgment normal.  Vitals reviewed.  LABORATORY DATA: Lab Results  Component Value Date   WBC 7.4 10/02/2023   HGB 13.3 10/02/2023   HCT 42.7 10/02/2023   MCV 96.8 10/02/2023   PLT 341 10/02/2023      Chemistry      Component Value Date/Time   NA 141 09/10/2023 0716   NA 140 03/13/2021 0000   K 3.0 (L) 09/10/2023 0716   CL 104 09/10/2023 0716   CO2 27 09/10/2023 0716   BUN 6 09/10/2023 0716   BUN 10 03/13/2021 0000   CREATININE 0.78 09/10/2023 0716   GLU 107 03/13/2021 0000  Component Value Date/Time   CALCIUM 9.0 09/10/2023 0716   ALKPHOS 101 09/10/2023 0716   AST 21 09/10/2023 0716   ALT 10 09/10/2023 0716   BILITOT 0.3 09/10/2023 0716       RADIOGRAPHIC STUDIES:  No results found.   ASSESSMENT/PLAN:  This is a very pleasant 52 year old Caucasian female diagnosed with stage IV (T4, N2, M1 a) non-small cell lung cancer, adenocarcinoma presented with multifocal disease involving the right upper lobe, right lower lobe as well as suspicious lower paratracheal lymphadenopathy and groundglass nodules in the left lung diagnosed in May 2022. The patient had molecular studies performed by [facility] and that showed no detectable mutation but this is likely secondary to low-level circulating free tumor DNA. If the patient has disease progression in the future, then we will likely retest her for molecular studies.    The patient is currently undergoing systemic chemotherapy with carboplatin for an AUC 5, Alimta 500 mg per metered square, and and Avastin 15 mg/kg IV every 3 weeks.  She is status post 38 cycles.  Starting from cycle #7, the patient has been on maintenance treatment with Alimta and Avastin.  Labs were reviewed.  Her CMP is pending.  Her urine protein is 100. Her BP was rechecked at 152/91. CMP is within  parameters, recommend that she proceed with cycle #39 today as scheduled.  I will arrange for a restaging CT scan of the CAP prior to her next cycle of treatment.   We will see her back for a follow up visit in 3 weeks for evaluation and repeat blood work before starting cycle #37.   I have sent her potassium to take 20 meq daily for 6 days.   The patient was advised to call immediately if she has any concerning symptoms in the interval. The patient voices understanding of current disease status and treatment options and is in agreement with the current care plan. All questions were answered. The patient knows to call the clinic with any problems, questions or concerns. We can certainly see the patient much sooner if necessary  Orders Placed This Encounter  Procedures   CT CHEST ABDOMEN PELVIS W CONTRAST    Standing Status:   Future    Standing Expiration Date:   10/01/2024    Order Specific Question:   If indicated for the ordered procedure, I authorize the administration of contrast media per Radiology protocol    Answer:   Yes    Order Specific Question:   Does the patient have a contrast media/X-ray dye allergy?    Answer:   No    Order Specific Question:   Preferred imaging location?    Answer:   [facility]    Order Specific Question:   If indicated for the ordered procedure, I authorize the administration of oral contrast media per Radiology protocol    Answer:   Yes     The total time spent in the appointment was 30-39 minutes  Jovanni Rash L Neena Beecham, PA-C 10/02/23

## 2023-09-30 ENCOUNTER — Ambulatory Visit: Payer: Commercial Managed Care - PPO | Admitting: Neurology

## 2023-09-30 ENCOUNTER — Encounter: Payer: Self-pay | Admitting: Neurology

## 2023-09-30 ENCOUNTER — Other Ambulatory Visit: Payer: Self-pay

## 2023-09-30 VITALS — BP 117/77 | HR 88 | Ht 61.0 in | Wt 105.0 lb

## 2023-09-30 DIAGNOSIS — M5417 Radiculopathy, lumbosacral region: Secondary | ICD-10-CM

## 2023-09-30 MED ORDER — DULOXETINE HCL 30 MG PO CPEP: 30.0000 mg | ORAL_CAPSULE | Freq: Every day | ORAL | 3 refills | Status: DC

## 2023-09-30 NOTE — Progress Notes (Signed)
Follow-up Visit   Date: 09/30/2023    CELISSA TERNES MRN: [id number] DOB: May 01, 1971    Kirsten Williams is a 52 y.o. right-handed Caucasian female with right lung adenocarcinoma, SLE, and GERD returning to the clinic for follow-up of lumbar radiculopathy.  The patient was accompanied to the clinic by self.  IMPRESSION/PLAN: Lumbar radiculopathy at L5 manifesting with bilateral feet paresthesias over the L5 dermatome and chronic low back pain.  NCS/EMG of 08/2022 shows chronic right L5 radiculopathy, no evidence of large fiber neuropathy.  Exam shows diminished sensation over the dorsum of the feet with instant strength and reflexes. She had marked benefit with PT.   - Continue home exercises, which she is compliant with  - Start Cymbalta 30mg  daily  - Stop gabapentin  Return to clinic in 6 months  --------------------------------------------- History of present illness: Starting around 2022, she began having numbness/tingling over the dorsum of the feet and lower legs. Symptoms have intensified over time and progress as the day goes on. She takes gabapentin 300mg  twice daily and sometimes will take up to 6 times daily. She also takes hydrocodone for right ankle pain. She had low back pain. She has not done PT.   UPDATE 09/30/2023: She is here for follow-up visit.  She has completed PT and reports having marked benefit with low back pain and hip pain.  Unfortunately, there was no significant change with her feet paresthesias.  She takes gabapentin 300mg  at bedtime, which gives minimal benefit. She is unable to tolerate higher dose because of cognitive side effects.  No new weakness or pain.   Medications:  Current Outpatient Medications on File Prior to Visit  Medication Sig Dispense Refill   amLODipine (NORVASC) 5 MG tablet Take 5 mg by mouth daily.     B Complex Vitamins (B COMPLEX 50 PO) Take by mouth.     Calcium Carbonate-Vit D-Min (CALCIUM 1200 PO) Take 1 capsule by mouth  daily.     cholecalciferol (VITAMIN D3) 25 MCG (1000 UNIT) tablet Take 2,000 Units by mouth daily.     cyclobenzaprine (FLEXERIL) 10 MG tablet Take 10 mg by mouth at bedtime as needed for muscle spasms.     denosumab (PROLIA) 60 MG/ML SOSY injection Inject 60 mg into the skin every 6 (six) months.     dexamethasone (DECADRON) 4 MG tablet 4 mg p.o. twice daily the day before, day of and day after the chemotherapy every 3 weeks. 40 tablet 2   dicyclomine (BENTYL) 10 MG capsule Take 10 mg by mouth every 6 (six) hours as needed.     diphenhydrAMINE (BENADRYL) 25 MG tablet Take 25 mg by mouth daily as needed for allergies.     famotidine (PEPCID) 20 MG tablet Take 20 mg by mouth at bedtime.     fluticasone (FLONASE) 50 MCG/ACT nasal spray Place 2 sprays into both nostrils daily.     folic acid (FOLVITE) 1 MG tablet TAKE 1 TABLET(1 MG) BY MOUTH DAILY 30 tablet 4   gabapentin (NEURONTIN) 300 MG capsule Take 300 mg by mouth 3 (three) times daily. Patient takes once or twice a day.     hydrocortisone 1 % lotion Apply 1 application topically 2 (two) times daily. 118 mL 0   hydroxychloroquine (PLAQUENIL) 200 MG tablet Take 200 mg by mouth 2 (two) times daily.     Multiple Vitamins-Minerals (MULTI ADULT GUMMIES) CHEW Chew 2 tablets by mouth daily.     omeprazole (PRILOSEC) 40 MG capsule Take  40 mg by mouth daily as needed (acid reflux).     oxyCODONE-acetaminophen (PERCOCET/ROXICET) 5-325 MG tablet Take 1 tablet by mouth every 8 (eight) hours as needed for severe pain. 30 tablet 0   potassium chloride SA (KLOR-CON M) 20 MEQ tablet Take 1 tablet (20 mEq total) by mouth daily. 5 tablet 0   Probiotic Product (PROBIOTIC-10 PO) Take by mouth.     prochlorperazine (COMPAZINE) 10 MG tablet TAKE 1 TABLET(10 MG) BY MOUTH EVERY 6 HOURS AS NEEDED 30 tablet 2   rizatriptan (MAXALT-MLT) 10 MG disintegrating tablet Take 10 mg by mouth 2 (two) times daily as needed.     tetrahydrozoline 0.05 % ophthalmic solution Place 1  drop into both eyes once. Systane Eye Drops once daily both eyes     triamcinolone ointment (KENALOG) 0.5 % Apply topically 3 (three) times daily.     No current facility-administered medications on file prior to visit.    Allergies:  Allergies  Allergen Reactions   Clindamycin/Lincomycin Itching and Nausea And Vomiting   Carboplatin Itching and Other (See Comments)    Erythema to hands/feet/arms/perineal area  Elevated BP     Vital Signs:  BP 117/77   Pulse 88   Ht 5\' 1"  (1.549 m)   Wt 105 lb (47.6 kg)   SpO2 100%   BMI 19.84 kg/m   Neurological Exam: MENTAL STATUS including orientation to time, place, person, recent and remote memory, attention span and concentration, language, and fund of knowledge is normal.  Speech is not dysarthric.  CRANIAL NERVES:  Pupils equal round and reactive to light.  Normal conjugate, extra-ocular eye movements in all directions of gaze.  No ptosis.  Face is symmetric.  MOTOR:  Motor strength is 5/5 in all extremities.  No atrophy, fasciculations or abnormal movements.  No pronator drift.  Tone is normal.    MSRs:  Reflexes are 2+/4 throughout.  SENSORY:  Intact to vibration throughout.  COORDINATION/GAIT:  Normal finger-to- nose-finger.  Intact rapid alternating movements bilaterally.  Gait narrow based and stable.   Data: NCS/EMG of the legs 08/14/2022: Chronic L5 radiculopathy affecting the right lower extremity, mild. There is no evidence of a large fiber sensorimotor polyneuropathy affecting the lower extremities.    Thank you for allowing me to participate in patient's care.  If I can answer any additional questions, I would be pleased to do so.    Sincerely,    Akeya Ryther K. Allena Katz, DO

## 2023-10-01 ENCOUNTER — Other Ambulatory Visit: Payer: Self-pay

## 2023-10-02 ENCOUNTER — Inpatient Hospital Stay: Payer: Commercial Managed Care - PPO

## 2023-10-02 ENCOUNTER — Inpatient Hospital Stay: Payer: Commercial Managed Care - PPO | Admitting: Physician Assistant

## 2023-10-02 ENCOUNTER — Other Ambulatory Visit: Payer: Self-pay

## 2023-10-02 VITALS — BP 152/91 | HR 82

## 2023-10-02 VITALS — BP 152/91 | HR 81 | Temp 98.1°F | Resp 16 | Wt 107.2 lb

## 2023-10-02 DIAGNOSIS — E876 Hypokalemia: Secondary | ICD-10-CM

## 2023-10-02 DIAGNOSIS — C3491 Malignant neoplasm of unspecified part of right bronchus or lung: Secondary | ICD-10-CM

## 2023-10-02 DIAGNOSIS — Z5112 Encounter for antineoplastic immunotherapy: Secondary | ICD-10-CM | POA: Diagnosis not present

## 2023-10-02 DIAGNOSIS — Z5111 Encounter for antineoplastic chemotherapy: Secondary | ICD-10-CM

## 2023-10-02 LAB — CBC WITH DIFFERENTIAL (CANCER CENTER ONLY)
Abs Immature Granulocytes: 0.02 10*3/uL (ref 0.00–0.07)
Basophils Absolute: 0.1 10*3/uL (ref 0.0–0.1)
Basophils Relative: 1 %
Eosinophils Absolute: 0.2 10*3/uL (ref 0.0–0.5)
Eosinophils Relative: 3 %
HCT: 42.7 % (ref 36.0–46.0)
Hemoglobin: 13.3 g/dL (ref 12.0–15.0)
Immature Granulocytes: 0 %
Lymphocytes Relative: 31 %
Lymphs Abs: 2.3 10*3/uL (ref 0.7–4.0)
MCH: 30.2 pg (ref 26.0–34.0)
MCHC: 31.1 g/dL (ref 30.0–36.0)
MCV: 96.8 fL (ref 80.0–100.0)
Monocytes Absolute: 0.6 10*3/uL (ref 0.1–1.0)
Monocytes Relative: 8 %
Neutro Abs: 4.2 10*3/uL (ref 1.7–7.7)
Neutrophils Relative %: 57 %
Platelet Count: 341 10*3/uL (ref 150–400)
RBC: 4.41 MIL/uL (ref 3.87–5.11)
RDW: 15.9 % — ABNORMAL HIGH (ref 11.5–15.5)
WBC Count: 7.4 10*3/uL (ref 4.0–10.5)
nRBC: 0 % (ref 0.0–0.2)

## 2023-10-02 LAB — CMP (CANCER CENTER ONLY)
ALT: 15 U/L (ref 0–44)
AST: 20 U/L (ref 15–41)
Albumin: 3.9 g/dL (ref 3.5–5.0)
Alkaline Phosphatase: 123 U/L (ref 38–126)
Anion gap: 11 (ref 5–15)
BUN: 5 mg/dL — ABNORMAL LOW (ref 6–20)
CO2: 25 mmol/L (ref 22–32)
Calcium: 9.4 mg/dL (ref 8.9–10.3)
Chloride: 102 mmol/L (ref 98–111)
Creatinine: 0.8 mg/dL (ref 0.44–1.00)
GFR, Estimated: >60 mL/min (ref >60)
Glucose, Bld: 159 mg/dL — ABNORMAL HIGH (ref 70–99)
Potassium: 3.2 mmol/L — ABNORMAL LOW (ref 3.5–5.1)
Sodium: 138 mmol/L (ref 135–145)
Total Bilirubin: 0.3 mg/dL (ref 0.3–1.2)
Total Protein: 7.9 g/dL (ref 6.5–8.1)

## 2023-10-02 LAB — TOTAL PROTEIN, URINE DIPSTICK: Protein, ur: 100 mg/dL — AB

## 2023-10-02 MED ORDER — PROCHLORPERAZINE MALEATE 10 MG PO TABS
10.0000 mg | ORAL_TABLET | Freq: Once | ORAL | Status: AC
  Administered 2023-10-02: 10 mg via ORAL
  Filled 2023-10-02: qty 1

## 2023-10-02 MED ORDER — DEXAMETHASONE SODIUM PHOSPHATE 10 MG/ML IJ SOLN
10.0000 mg | Freq: Once | INTRAMUSCULAR | Status: AC
  Administered 2023-10-02: 10 mg via INTRAVENOUS
  Filled 2023-10-02: qty 1

## 2023-10-02 MED ORDER — SODIUM CHLORIDE 0.9 % IV SOLN
15.0000 mg/kg | Freq: Once | INTRAVENOUS | Status: AC
  Administered 2023-10-02: 800 mg via INTRAVENOUS
  Filled 2023-10-02: qty 32

## 2023-10-02 MED ORDER — SODIUM CHLORIDE 0.9 % IV SOLN: Freq: Once | INTRAVENOUS | Status: AC

## 2023-10-02 MED ORDER — POTASSIUM CHLORIDE CRYS ER 20 MEQ PO TBCR: 20.0000 meq | EXTENDED_RELEASE_TABLET | Freq: Every day | ORAL | 0 refills | Status: DC

## 2023-10-02 MED ORDER — SODIUM CHLORIDE 0.9 % IV SOLN
500.0000 mg/m2 | Freq: Once | INTRAVENOUS | Status: AC
  Administered 2023-10-02: 800 mg via INTRAVENOUS
  Filled 2023-10-02: qty 20

## 2023-10-02 NOTE — Progress Notes (Signed)
Per Cassie, PA-C, okay to proceed with treatment with urine protein 100 and elevated BP.

## 2023-10-02 NOTE — Patient Instructions (Signed)
[facility]  Discharge Instructions: Thank you for choosing [facility] to provide your oncology and hematology care.   If you have a lab appointment with the Cancer Center, please go directly to the Cancer Center and check in at the registration area.   Wear comfortable clothing and clothing appropriate for easy access to any Portacath or PICC line.   We strive to give you quality time with your provider. You may need to reschedule your appointment if you arrive late (15 or more minutes).  Arriving late affects you and other patients whose appointments are after yours.  Also, if you miss three or more appointments without notifying the office, you may be dismissed from the clinic at the provider's discretion.      For prescription refill requests, have your pharmacy contact our office and allow 72 hours for refills to be completed.    Today you received the following chemotherapy and/or immunotherapy agents: bevacizumab and pemetrexed      To help prevent nausea and vomiting after your treatment, we encourage you to take your nausea medication as directed.  BELOW ARE SYMPTOMS THAT SHOULD BE REPORTED IMMEDIATELY: *FEVER GREATER THAN 100.4 F (38 C) OR HIGHER *CHILLS OR SWEATING *NAUSEA AND VOMITING THAT IS NOT CONTROLLED WITH YOUR NAUSEA MEDICATION *UNUSUAL SHORTNESS OF BREATH *UNUSUAL BRUISING OR BLEEDING *URINARY PROBLEMS (pain or burning when urinating, or frequent urination) *BOWEL PROBLEMS (unusual diarrhea, constipation, pain near the anus) TENDERNESS IN MOUTH AND THROAT WITH OR WITHOUT PRESENCE OF ULCERS (sore throat, sores in mouth, or a toothache) UNUSUAL RASH, SWELLING OR PAIN  UNUSUAL VAGINAL DISCHARGE OR ITCHING   Items with * indicate a potential emergency and should be followed up as soon as possible or go to the Emergency Department if any problems should occur.  Please show the CHEMOTHERAPY ALERT CARD or IMMUNOTHERAPY  ALERT CARD at check-in to the Emergency Department and triage nurse.  Should you have questions after your visit or need to cancel or reschedule your appointment, please contact [facility]  Dept: [phone]  and follow the prompts.  Office hours are 8:00 a.m. to 4:30 p.m. Monday - Friday. Please note that voicemails left after 4:00 p.m. may not be returned until the following business day.  We are closed weekends and major holidays. You have access to a nurse at all times for urgent questions. Please call the main number to the clinic Dept: [phone] and follow the prompts.   For any non-urgent questions, you may also contact your provider using MyChart. We now offer e-Visits for anyone 51 and older to request care online for non-urgent symptoms. For details visit [email or web address].   Also download the MyChart app! Go to the app store, search "MyChart", open the app, select [place], and log in with your MyChart username and password.

## 2023-10-05 ENCOUNTER — Other Ambulatory Visit: Payer: Self-pay | Admitting: Physician Assistant

## 2023-10-05 DIAGNOSIS — E876 Hypokalemia: Secondary | ICD-10-CM

## 2023-10-15 ENCOUNTER — Encounter (HOSPITAL_COMMUNITY): Payer: Self-pay

## 2023-10-15 ENCOUNTER — Ambulatory Visit (HOSPITAL_COMMUNITY)
Admission: RE | Admit: 2023-10-15 | Discharge: 2023-10-15 | Disposition: A | Discharge status: Home / Self Care | Payer: Commercial Managed Care - PPO | Source: Ambulatory Visit | Attending: Physician Assistant | Admitting: Physician Assistant

## 2023-10-15 DIAGNOSIS — C3491 Malignant neoplasm of unspecified part of right bronchus or lung: Secondary | ICD-10-CM | POA: Diagnosis present

## 2023-10-15 MED ORDER — IOHEXOL 9 MG/ML PO SOLN
ORAL | Status: AC
  Filled 2023-10-15: qty 1000

## 2023-10-15 MED ORDER — IOHEXOL 300 MG/ML  SOLN
75.0000 mL | Freq: Once | INTRAMUSCULAR | Status: AC | PRN
  Administered 2023-10-15: 75 mL via INTRAVENOUS

## 2023-10-15 MED ORDER — IOHEXOL 9 MG/ML PO SOLN
1000.0000 mL | ORAL | Status: AC
  Administered 2023-10-15: 1000 mL via ORAL

## 2023-10-16 ENCOUNTER — Ambulatory Visit: Payer: Commercial Managed Care - PPO | Admitting: Internal Medicine

## 2023-10-16 ENCOUNTER — Ambulatory Visit: Payer: Commercial Managed Care - PPO

## 2023-10-16 ENCOUNTER — Other Ambulatory Visit: Payer: Commercial Managed Care - PPO

## 2023-10-23 ENCOUNTER — Encounter: Payer: Self-pay | Admitting: Internal Medicine

## 2023-10-23 ENCOUNTER — Inpatient Hospital Stay: Payer: Commercial Managed Care - PPO | Attending: Hematology and Oncology

## 2023-10-23 ENCOUNTER — Inpatient Hospital Stay: Payer: Commercial Managed Care - PPO | Admitting: Internal Medicine

## 2023-10-23 ENCOUNTER — Inpatient Hospital Stay: Payer: Commercial Managed Care - PPO

## 2023-10-23 VITALS — BP 140/76 | HR 87 | Temp 98.0°F | Resp 17 | Ht 61.0 in | Wt 105.9 lb

## 2023-10-23 DIAGNOSIS — M25539 Pain in unspecified wrist: Secondary | ICD-10-CM | POA: Diagnosis not present

## 2023-10-23 DIAGNOSIS — Z5112 Encounter for antineoplastic immunotherapy: Secondary | ICD-10-CM | POA: Diagnosis present

## 2023-10-23 DIAGNOSIS — M25511 Pain in right shoulder: Secondary | ICD-10-CM | POA: Diagnosis not present

## 2023-10-23 DIAGNOSIS — L0292 Furuncle, unspecified: Secondary | ICD-10-CM | POA: Diagnosis not present

## 2023-10-23 DIAGNOSIS — I10 Essential (primary) hypertension: Secondary | ICD-10-CM | POA: Insufficient documentation

## 2023-10-23 DIAGNOSIS — E876 Hypokalemia: Secondary | ICD-10-CM | POA: Diagnosis not present

## 2023-10-23 DIAGNOSIS — C3411 Malignant neoplasm of upper lobe, right bronchus or lung: Secondary | ICD-10-CM | POA: Diagnosis present

## 2023-10-23 DIAGNOSIS — C3491 Malignant neoplasm of unspecified part of right bronchus or lung: Secondary | ICD-10-CM

## 2023-10-23 DIAGNOSIS — M329 Systemic lupus erythematosus, unspecified: Secondary | ICD-10-CM | POA: Insufficient documentation

## 2023-10-23 DIAGNOSIS — Z5111 Encounter for antineoplastic chemotherapy: Secondary | ICD-10-CM | POA: Insufficient documentation

## 2023-10-23 LAB — CBC WITH DIFFERENTIAL (CANCER CENTER ONLY)
Abs Immature Granulocytes: 0.02 10*3/uL (ref 0.00–0.07)
Basophils Absolute: 0.1 10*3/uL (ref 0.0–0.1)
Basophils Relative: 1 %
Eosinophils Absolute: 0.1 10*3/uL (ref 0.0–0.5)
Eosinophils Relative: 1 %
HCT: 39 % (ref 36.0–46.0)
Hemoglobin: 12.4 g/dL (ref 12.0–15.0)
Immature Granulocytes: 0 %
Lymphocytes Relative: 25 %
Lymphs Abs: 2.2 10*3/uL (ref 0.7–4.0)
MCH: 30.3 pg (ref 26.0–34.0)
MCHC: 31.8 g/dL (ref 30.0–36.0)
MCV: 95.4 fL (ref 80.0–100.0)
Monocytes Absolute: 0.9 10*3/uL (ref 0.1–1.0)
Monocytes Relative: 10 %
Neutro Abs: 5.7 10*3/uL (ref 1.7–7.7)
Neutrophils Relative %: 63 %
Platelet Count: 293 10*3/uL (ref 150–400)
RBC: 4.09 MIL/uL (ref 3.87–5.11)
RDW: 16.6 % — ABNORMAL HIGH (ref 11.5–15.5)
WBC Count: 9 10*3/uL (ref 4.0–10.5)
nRBC: 0 % (ref 0.0–0.2)

## 2023-10-23 LAB — CMP (CANCER CENTER ONLY)
ALT: 10 U/L (ref 0–44)
AST: 18 U/L (ref 15–41)
Albumin: 3.7 g/dL (ref 3.5–5.0)
Alkaline Phosphatase: 115 U/L (ref 38–126)
Anion gap: 10 (ref 5–15)
BUN: 6 mg/dL (ref 6–20)
CO2: 25 mmol/L (ref 22–32)
Calcium: 9.9 mg/dL (ref 8.9–10.3)
Chloride: 106 mmol/L (ref 98–111)
Creatinine: 0.78 mg/dL (ref 0.44–1.00)
GFR, Estimated: >60 mL/min (ref >60)
Glucose, Bld: 128 mg/dL — ABNORMAL HIGH (ref 70–99)
Potassium: 3.1 mmol/L — ABNORMAL LOW (ref 3.5–5.1)
Sodium: 141 mmol/L (ref 135–145)
Total Bilirubin: 0.3 mg/dL (ref <1.2)
Total Protein: 7.4 g/dL (ref 6.5–8.1)

## 2023-10-23 LAB — TOTAL PROTEIN, URINE DIPSTICK: Protein, ur: 30 mg/dL — AB

## 2023-10-23 MED ORDER — DEXAMETHASONE SODIUM PHOSPHATE 10 MG/ML IJ SOLN
10.0000 mg | Freq: Once | INTRAMUSCULAR | Status: AC
Start: 2023-10-23 — End: 2023-10-23
  Administered 2023-10-23: 10 mg via INTRAVENOUS

## 2023-10-23 MED ORDER — SODIUM CHLORIDE 0.9 % IV SOLN
15.0000 mg/kg | Freq: Once | INTRAVENOUS | Status: AC
  Administered 2023-10-23: 800 mg via INTRAVENOUS
  Filled 2023-10-23: qty 32

## 2023-10-23 MED ORDER — PROCHLORPERAZINE MALEATE 10 MG PO TABS
10.0000 mg | ORAL_TABLET | Freq: Once | ORAL | Status: AC
  Administered 2023-10-23: 10 mg via ORAL

## 2023-10-23 MED ORDER — CYANOCOBALAMIN 1000 MCG/ML IJ SOLN
1000.0000 ug | Freq: Once | INTRAMUSCULAR | Status: AC
Start: 2023-10-23 — End: 2023-10-23
  Administered 2023-10-23: 1000 ug via INTRAMUSCULAR

## 2023-10-23 MED ORDER — SODIUM CHLORIDE 0.9 % IV SOLN: Freq: Once | INTRAVENOUS | Status: AC

## 2023-10-23 MED ORDER — PEMETREXED DISODIUM CHEMO INJECTION 500 MG
500.0000 mg/m2 | Freq: Once | INTRAVENOUS | Status: AC
  Administered 2023-10-23: 800 mg via INTRAVENOUS
  Filled 2023-10-23: qty 20

## 2023-10-23 NOTE — Progress Notes (Signed)
[facility] [phone]   [phone]  OFFICE PROGRESS NOTE  [name], PA-C [address] [place] [address]  DIAGNOSIS:  Stage IV (T4, N2, M1 a) non-small cell lung cancer, adenocarcinoma presented with multifocal disease involving the right upper lobe, right lower lobe as well as suspicious lower paratracheal lymphadenopathy and groundglass nodules in the left lung diagnosed in May 2022. The patient had molecular studies performed by [facility] [employer] and that showed no detectable mutation but this is likely secondary to low-level circulating free tumor DNA.  PRIOR THERAPY: None.  CURRENT THERAPY: palliative systemic chemotherapy with carboplatin for AUC of 5, Alimta 500 Mg/M2 and Avastin 15 Mg/KG every 3 weeks.  The patient is not a great candidate for immunotherapy because of her history of active systemic lupus erythematosus.  Starting from cycle #7 the patient is on maintenance treatment with Alimta and Avastin every 3 weeks.  She is status post 40 cycles.  INTERVAL HISTORY: Kirsten Williams 52 y.o. female returns to the clinic today for follow-up visit. Discussed the use of AI scribe software for clinical note transcription with the patient, who gave verbal consent to proceed.  History of Present Illness   The patient, a 52 year old with a history of cancer, has been undergoing chemotherapy with carboplatin, Alimta, and Avastin since May 2022. She has completed forty cycles of treatment so far. Recently, she has been experiencing some dermatological issues, including the recurrence of boils that had previously been treated by a dermatologist. The patient has been self-administering doxycycline to manage these boils, despite previous adverse reactions to the medication. She is scheduled to see the dermatologist in early December for potential surgical intervention.  In addition to the skin issues, the patient has been experiencing pain  radiating from the wrist to the shoulder for the past two days, which has been affecting her ability to grasp objects. She also has a cyst on her right shoulder. She reported an unusual tightness in the chest upon waking up on the day of the consultation, which she had not experienced before. The patient also noted that her blood pressure was higher than usual on the day of the consultation.  Despite these issues, the patient's recent scans and blood work have been largely positive, with the exception of a low potassium level of 3.1.       MEDICAL HISTORY: Past Medical History:  Diagnosis Date   Adenocarcinoma, lung, right (HCC) 05/03/2021   Kirsten Williams is a 52 y.o. female with a history of lung nodules dating back to 2019 who is referred in consultation with [name], PA-C for assessment and management. She is a former smoker having quit in 2016. 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 She missed her screening CT last year and imaging was ordered last month. CT chest from 02-16-2021 reveals pro   Anxiety    GERD (gastroesophageal reflux disease)    SLE (systemic lupus erythematosus) (HCC) 05/03/2021   SLE (systemic lupus erythematosus) (HCC) 05/03/2021    ALLERGIES:  is allergic to clindamycin/lincomycin and carboplatin.  MEDICATIONS:  Current Outpatient Medications  Medication Sig Dispense Refill   amLODipine (NORVASC) 5 MG tablet Take 5 mg by mouth daily.     B Complex Vitamins (B COMPLEX 50 PO) Take by mouth.     Calcium Carbonate-Vit D-Min (CALCIUM 1200 PO) Take 1 capsule by mouth daily.     cholecalciferol (VITAMIN D3) 25 MCG (  1000 UNIT) tablet Take 2,000 Units by mouth daily.     cyclobenzaprine (FLEXERIL) 10 MG tablet Take 10 mg by mouth at bedtime as needed for muscle spasms.     denosumab (PROLIA) 60 MG/ML SOSY injection Inject 60 mg into the skin every 6 (six) months.     dexamethasone (DECADRON) 4 MG tablet 4 mg p.o. twice daily the day  before, day of and day after the chemotherapy every 3 weeks. 40 tablet 2   dicyclomine (BENTYL) 10 MG capsule Take 10 mg by mouth every 6 (six) hours as needed.     diphenhydrAMINE (BENADRYL) 25 MG tablet Take 25 mg by mouth daily as needed for allergies.     DULoxetine (CYMBALTA) 30 MG capsule Take 1 capsule (30 mg total) by mouth at bedtime. 30 capsule 3   famotidine (PEPCID) 20 MG tablet Take 20 mg by mouth at bedtime.     fluticasone (FLONASE) 50 MCG/ACT nasal spray Place 2 sprays into both nostrils daily.     folic acid (FOLVITE) 1 MG tablet TAKE 1 TABLET(1 MG) BY MOUTH DAILY 30 tablet 4   gabapentin (NEURONTIN) 300 MG capsule Take 300 mg by mouth 3 (three) times daily. Patient takes once or twice a day.     hydrocortisone 1 % lotion Apply 1 application topically 2 (two) times daily. 118 mL 0   hydroxychloroquine (PLAQUENIL) 200 MG tablet Take 200 mg by mouth 2 (two) times daily.     Multiple Vitamins-Minerals (MULTI ADULT GUMMIES) CHEW Chew 2 tablets by mouth daily.     omeprazole (PRILOSEC) 40 MG capsule Take 40 mg by mouth daily as needed (acid reflux).     oxyCODONE-acetaminophen (PERCOCET/ROXICET) 5-325 MG tablet Take 1 tablet by mouth every 8 (eight) hours as needed for severe pain. 30 tablet 0   potassium chloride SA (KLOR-CON M) 20 MEQ tablet Take 1 tablet (20 mEq total) by mouth daily. 6 tablet 0   Probiotic Product (PROBIOTIC-10 PO) Take by mouth.     prochlorperazine (COMPAZINE) 10 MG tablet TAKE 1 TABLET(10 MG) BY MOUTH EVERY 6 HOURS AS NEEDED 30 tablet 2   rizatriptan (MAXALT-MLT) 10 MG disintegrating tablet Take 10 mg by mouth 2 (two) times daily as needed.     tetrahydrozoline 0.05 % ophthalmic solution Place 1 drop into both eyes once. Systane Eye Drops once daily both eyes     triamcinolone ointment (KENALOG) 0.5 % Apply topically 3 (three) times daily.     No current facility-administered medications for this visit.    SURGICAL HISTORY:  Past Surgical History:   Procedure Laterality Date   ABDOMINAL HYSTERECTOMY     BRONCHIAL BIOPSY  04/24/2021   Procedure: BRONCHIAL BIOPSIES;  Surgeon: Leslye Peer, MD;  Location: [facility] ENDOSCOPY;  Service: Pulmonary;;   BRONCHIAL BRUSHINGS  04/24/2021   Procedure: BRONCHIAL BRUSHINGS;  Surgeon: Leslye Peer, MD;  Location: [facility] ENDOSCOPY;  Service: Pulmonary;;   BRONCHIAL NEEDLE ASPIRATION BIOPSY  04/24/2021   Procedure: BRONCHIAL NEEDLE ASPIRATION BIOPSIES;  Surgeon: Leslye Peer, MD;  Location: [facility] ENDOSCOPY;  Service: Pulmonary;;   BRONCHIAL WASHINGS  04/24/2021   Procedure: BRONCHIAL WASHINGS;  Surgeon: Leslye Peer, MD;  Location: [facility] ENDOSCOPY;  Service: Pulmonary;;   CESAREAN SECTION  11/09/1990   DILATION AND CURETTAGE OF UTERUS Bilateral 09/09/1996   FIDUCIAL MARKER PLACEMENT  04/24/2021   Procedure: FIDUCIAL MARKER PLACEMENT;  Surgeon: Leslye Peer, MD;  Location: [facility] ENDOSCOPY;  Service: Pulmonary;;   TONSILLECTOMY  12/10/1977  VIDEO BRONCHOSCOPY WITH ENDOBRONCHIAL NAVIGATION Bilateral 04/24/2021   Procedure: VIDEO BRONCHOSCOPY WITH ENDOBRONCHIAL NAVIGATION;  Surgeon: Leslye Peer, MD;  Location: [facility] ENDOSCOPY;  Service: Pulmonary;  Laterality: Bilateral;    REVIEW OF SYSTEMS:  Constitutional: positive for fatigue Eyes: negative Ears, nose, mouth, throat, and face: negative Respiratory: negative Cardiovascular: negative Gastrointestinal: positive for nausea Genitourinary:negative Integument/breast: positive for rash Hematologic/lymphatic: negative Musculoskeletal:positive for arthralgias Neurological: negative Behavioral/Psych: negative Endocrine: negative Allergic/Immunologic: negative   PHYSICAL EXAMINATION: General appearance: alert, cooperative, fatigued, and no distress Head: Normocephalic, without obvious abnormality, atraumatic Neck: no adenopathy, no JVD, supple, symmetrical, trachea midline, and thyroid not enlarged, symmetric, no tenderness/mass/nodules Lymph nodes: Cervical,  supraclavicular, and axillary nodes normal. Resp: clear to auscultation bilaterally Back: symmetric, no curvature. ROM normal. No CVA tenderness. Cardio: regular rate and rhythm, S1, S2 normal, no murmur, click, rub or gallop GI: soft, non-tender; bowel sounds normal; no masses,  no organomegaly Extremities: extremities normal, atraumatic, no cyanosis or edema Neurologic: Alert and oriented X 3, normal strength and tone. Normal symmetric reflexes. Normal coordination and gait  ECOG PERFORMANCE STATUS: 1 - Symptomatic but completely ambulatory  Blood pressure (!) 140/76, pulse 87, temperature 98 F (36.7 C), temperature source Temporal, resp. rate 17, height 5\' 1"  (1.549 m), weight 105 lb 14.4 oz (48 kg), SpO2 100%.  LABORATORY DATA: Lab Results  Component Value Date   WBC 9.0 10/23/2023   HGB 12.4 10/23/2023   HCT 39.0 10/23/2023   MCV 95.4 10/23/2023   PLT 293 10/23/2023      Chemistry      Component Value Date/Time   NA 141 10/23/2023 1031   NA 140 03/13/2021 0000   K 3.1 (L) 10/23/2023 1031   CL 106 10/23/2023 1031   CO2 25 10/23/2023 1031   BUN 6 10/23/2023 1031   BUN 10 03/13/2021 0000   CREATININE 0.78 10/23/2023 1031   GLU 107 03/13/2021 0000      Component Value Date/Time   CALCIUM 9.9 10/23/2023 1031   ALKPHOS 115 10/23/2023 1031   AST 18 10/23/2023 1031   ALT 10 10/23/2023 1031   BILITOT 0.3 10/23/2023 1031       RADIOGRAPHIC STUDIES: CT CHEST ABDOMEN PELVIS W CONTRAST  Result Date: 10/22/2023 CLINICAL DATA:  Stage IV lung cancer. Ongoing chemotherapy. No current complaints. * Tracking Code: BO * EXAM: CT CHEST, ABDOMEN, AND PELVIS WITH CONTRAST TECHNIQUE: Multidetector CT imaging of the chest, abdomen and pelvis was performed following the standard protocol during bolus administration of intravenous contrast. RADIATION DOSE REDUCTION: This exam was performed according to the departmental dose-optimization program which includes automated exposure control,  adjustment of the mA and/or kV according to patient size and/or use of iterative reconstruction technique. CONTRAST:  75mL OMNIPAQUE IOHEXOL 300 MG/ML  SOLN COMPARISON:  CT 07/17/2023 and older. FINDINGS: CT CHEST FINDINGS Cardiovascular: Heart is nonenlarged. No significant pericardial effusion. Atherosclerotic changes along the normal caliber thoracic aorta with partially calcified plaque. Mediastinum/Nodes: Preserved thyroid gland. Normal caliber thoracic esophagus. No specific abnormal lymph node enlargement identified in the axillary region, hilum. Mediastinal nodes are again seen. Lymph node just anterior to the right side of the carina which previously measured 11 mm in short axis, today measures 12 mm in short axis and length of 18 mm, similar. Other small mediastinal nodes are stable including prevascular, subcarinal. These under a cm in short axis. No new nodal enlargement. Lungs/Pleura: Once again left lung has some areas of patchy ground-glass. Area lateral in the left upper  lobe which previously had an AP dimension of 20 mm, today on series 4 image 41 measures 20 mm once again. Other areas are similar. Few air cysts identified. Right lung once again has a spiculated area with retraction along the superior segment of the right lower lobe abutting the major fissure. There is fiduciary marker just caudal and posterior to this. On the prior examination the area measured 2.3 x 1.6 cm and today when measured in the same fashion and same technique would measure 2.3 x 1.5 cm, similar on series 4, image 65. Smaller area posteromedial right upper lobe is also stable on series 4, image 36. There is a fiduciary marker just caudal and lateral to the lesion. Additional smaller areas of nodular ground-glass are stable such as right lung apex series 4, image 28. More solid rounded nodule right lung series 4, image 56 measuring 3-4 mm is also stable. No new right-sided lung nodule. Bilateral there are no areas of  consolidation, pneumothorax or effusion. Musculoskeletal: Mild degenerative changes of the spine and sternum. CT ABDOMEN PELVIS FINDINGS Hepatobiliary: No focal liver abnormality is seen. No gallstones, gallbladder wall thickening, or biliary dilatation. Patent portal vein. Pancreas: Unremarkable. No pancreatic ductal dilatation or surrounding inflammatory changes. Spleen: Normal in size without focal abnormality. Adrenals/Urinary Tract: Stable bilateral adrenal nodules. Again left 2.1 cm and right 1.8 cm. Previously described as adenomas. Extrarenal pelvis seen of the right kidney. No enhancing renal mass or collecting system dilatation. The ureters have normal course and caliber extending down to the urinary bladder. Preserved contour to the urinary bladder. Stomach/Bowel: Stomach is within normal limits. Appendix appears normal. No evidence of bowel wall thickening, distention, or inflammatory changes. Vascular/Lymphatic: Aortic atherosclerosis. No enlarged abdominal or pelvic lymph nodes. Reproductive: Status post hysterectomy. No adnexal masses. Other: No free air or free fluid. Musculoskeletal: Scattered degenerative changes of the spine and pelvis. IMPRESSION: Overall no significant interval change. Stable enlarged right paratracheal lymph node. No new nodal enlargement. Stable areas of lung nodularity again greatest in the right lung. No new lung masses. Fatty liver infiltration. No bowel obstruction, free air or free fluid. Electronically Signed   By: Karen Kays M.D.   On: 10/22/2023 17:07    ASSESSMENT AND PLAN:  This is a very pleasant 52 years old white female recently diagnosed with a stage IV non-small cell lung cancer, adenocarcinoma with no actionable mutation diagnosed in May 2022.  The patient has also history of systemic lupus erythematosus and she is not a candidate for immunotherapy. She is currently undergoing systemic chemotherapy with carboplatin for AUC of 5, Alimta 500 Mg/M2 and  Avastin 15 Mg/KG every 3 weeks status post 40 cycles.  Starting from cycle #7 the patient is on maintenance treatment with Alimta and Avastin every 3 weeks. The patient has been tolerating this treatment fairly well. She had repeat CT scan of the chest, abdomen and pelvis performed recently.  I personally and independently reviewed the scan and discussed the result with the patient today. Her scan showed no concerning findings for disease progression. Assessment and Plan    Non-Small Cell Lung Cancer (NSCLC) NSCLC diagnosed in May 2022, currently on Alimta and Avastin chemotherapy every three weeks. Completed 40 cycles. Recent scans clear. Blood work shows hypokalemia. Discussed risks (fatigue, nausea, hypokalemia) and benefits (tumor control, prolonged survival) of continuing chemotherapy. - Proceed with cycle 41 of chemotherapy today - Advise potassium-rich foods (bananas, orange juice) to correct hypokalemia  Hypokalemia Potassium level at 3.1 mmol/L. Discussed  risks (muscle weakness, arrhythmias) and benefits of dietary correction. - Advise potassium-rich foods (bananas, orange juice)  Cutaneous Abscesses Recurrent boils, currently flaring. Previous doxycycline caused gastrointestinal upset. Dermatologist suggested minor surgical intervention. Discussed risks (infection, scarring) and benefits (symptom relief). - Continue doxycycline as prescribed - Follow up with dermatologist in early December for potential minor surgical intervention and excision of cyst on right shoulder  Right Shoulder Pain Pain radiating from wrist to shoulder, possibly related to shoulder cyst, affecting grasping ability. Discussed potential referral to orthopedic specialist if pain persists. - Evaluate shoulder pain further if symptoms persist - Consider referral to orthopedic specialist if pain does not improve  Hypertension Blood pressure 140/76 mmHg, reports chest tightness. Discussed risks (cardiovascular  events) and benefits of regular monitoring. - Monitor blood pressure regularly - Advise to report persistent or worsening chest tightness  Follow-up - Schedule next chemotherapy session in three weeks - Advise to contact dermatologist if abscesses become too bothersome before scheduled appointment.   She was advised to call immediately if she has any other concerning symptoms in the interval. The patient voices understanding of current disease status and treatment options and is in agreement with the current care plan.  All questions were answered. The patient knows to call the clinic with any problems, questions or concerns. We can certainly see the patient much sooner if necessary. The total time spent in the appointment was 30 minutes.  Disclaimer: This note was dictated with voice recognition software. Similar sounding words can inadvertently be transcribed and may not be corrected upon review.

## 2023-10-27 ENCOUNTER — Other Ambulatory Visit: Payer: Self-pay | Admitting: Physician Assistant

## 2023-10-30 ENCOUNTER — Encounter: Payer: Self-pay | Admitting: Sports Medicine

## 2023-10-30 ENCOUNTER — Encounter: Payer: Self-pay | Admitting: Internal Medicine

## 2023-10-30 NOTE — Telephone Encounter (Signed)
Telephone call  

## 2023-11-10 NOTE — Progress Notes (Unsigned)
[facility] OFFICE PROGRESS NOTE  [name], PA-C [address] [place] [address]  DIAGNOSIS: Stage IV (T4, N2, M1 a) non-small cell lung cancer, adenocarcinoma presented with multifocal disease involving the right upper lobe, right lower lobe as well as suspicious lower paratracheal lymphadenopathy and groundglass nodules in the left lung diagnosed in May 2022. The patient had molecular studies performed by [facility] and that showed no detectable mutation but this is likely secondary to low-level circulating free tumor DNA.    PRIOR THERAPY: None  CURRENT THERAPY: Palliative systemic chemotherapy with carboplatin for AUC of 5, Alimta 500 Mg/M2 and Avastin 15 Mg/KG every 3 weeks.  The patient is not a great candidate for immunotherapy in the event that she has a genetic mutation.  She is status post 41 cycles. Starting from cycle #6, the patient will start maintenance Alimta and Avastin.    INTERVAL HISTORY: Kirsten Williams 52 y.o. female returns to the clinic today for a follow-up visit.  The patient was last seen 3 weeks ago by Dr. Arbutus Ped.  She typically tolerates treatment well except she does experience a bandlike sensation around her diaphragm following treatment wince she started 2 years ago. She takes oxycodone sparingly for this. She is requesting refill.    Her main concern today is related to skin infections and boils.  She has had a history of few years ago of having some boils bilaterally in her groin.  One of them on her right side seems to be healing well but she does get some mild drainage from the left boil.  She also has had a cyst and a boil on her right shoulder that is painful and erythematous.  She was prescribed doxycycline by one of her other providers.  Doxycycline does cause some GI upset so instead of taking this twice daily she was taking it once a day.  She just completed this earlier this week on 11/11/2023.  She is scheduled to see  dermatology later this week Friday morning at 920 on 11/15/2023.  The patient had some GI upset with the doxycycline and had some diarrhea which has since resolved.  She already takes a probiotic daily.  The patient denies any fevers.  She denies any changes with her intermittent night sweats.  She denies any nausea or vomiting.  Denies any changes with her dyspnea.  She did have some chest congestion and cough since last being seen.  He has been taking Robitussin.  Overall she does feel like her cough is improving.     She is here today for evaluation repeat blood work before undergoing cycle #42.  MEDICAL HISTORY: Past Medical History:  Diagnosis Date   Adenocarcinoma, lung, right (HCC) 05/03/2021   Kirsten Williams is a 52 y.o. female with a history of lung nodules dating back to 2019 who is referred in consultation with [name], PA-C for assessment and management. She is a former smoker having quit in 2016. 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 She missed her screening CT last year and imaging was ordered last month. CT chest from 02-16-2021 reveals pro   Anxiety    GERD (gastroesophageal reflux disease)    SLE (systemic lupus erythematosus) (HCC) 05/03/2021   SLE (systemic lupus erythematosus) (HCC) 05/03/2021    ALLERGIES:  is allergic to clindamycin/lincomycin and carboplatin.  MEDICATIONS:  Current Outpatient Medications  Medication Sig Dispense Refill   amLODipine (NORVASC) 5 MG tablet Take  5 mg by mouth daily.     B Complex Vitamins (B COMPLEX 50 PO) Take by mouth.     Calcium Carbonate-Vit D-Min (CALCIUM 1200 PO) Take 1 capsule by mouth daily.     cholecalciferol (VITAMIN D3) 25 MCG (1000 UNIT) tablet Take 2,000 Units by mouth daily.     cyclobenzaprine (FLEXERIL) 10 MG tablet Take 10 mg by mouth at bedtime as needed for muscle spasms.     denosumab (PROLIA) 60 MG/ML SOSY injection Inject 60 mg into the skin every 6 (six) months.      dexamethasone (DECADRON) 4 MG tablet 4 mg p.o. twice daily the day before, day of and day after the chemotherapy every 3 weeks. 40 tablet 2   dicyclomine (BENTYL) 10 MG capsule Take 10 mg by mouth every 6 (six) hours as needed.     diphenhydrAMINE (BENADRYL) 25 MG tablet Take 25 mg by mouth daily as needed for allergies.     DULoxetine (CYMBALTA) 30 MG capsule Take 1 capsule (30 mg total) by mouth at bedtime. 30 capsule 3   famotidine (PEPCID) 20 MG tablet Take 20 mg by mouth at bedtime.     fluticasone (FLONASE) 50 MCG/ACT nasal spray Place 2 sprays into both nostrils daily.     folic acid (FOLVITE) 1 MG tablet TAKE 1 TABLET(1 MG) BY MOUTH DAILY 30 tablet 4   gabapentin (NEURONTIN) 300 MG capsule Take 300 mg by mouth 3 (three) times daily. Patient takes once or twice a day.     hydrocortisone 1 % lotion Apply 1 application topically 2 (two) times daily. 118 mL 0   hydroxychloroquine (PLAQUENIL) 200 MG tablet Take 200 mg by mouth 2 (two) times daily.     Multiple Vitamins-Minerals (MULTI ADULT GUMMIES) CHEW Chew 2 tablets by mouth daily.     omeprazole (PRILOSEC) 40 MG capsule Take 40 mg by mouth daily as needed (acid reflux).     oxyCODONE-acetaminophen (PERCOCET/ROXICET) 5-325 MG tablet Take 1 tablet by mouth every 8 (eight) hours as needed for severe pain. 30 tablet 0   potassium chloride SA (KLOR-CON M) 20 MEQ tablet Take 1 tablet (20 mEq total) by mouth daily. 6 tablet 0   Probiotic Product (PROBIOTIC-10 PO) Take by mouth.     prochlorperazine (COMPAZINE) 10 MG tablet TAKE 1 TABLET(10 MG) BY MOUTH EVERY 6 HOURS AS NEEDED 30 tablet 2   rizatriptan (MAXALT-MLT) 10 MG disintegrating tablet Take 10 mg by mouth 2 (two) times daily as needed.     tetrahydrozoline 0.05 % ophthalmic solution Place 1 drop into both eyes once. Systane Eye Drops once daily both eyes     triamcinolone ointment (KENALOG) 0.5 % Apply topically 3 (three) times daily.     No current facility-administered medications for  this visit.    SURGICAL HISTORY:  Past Surgical History:  Procedure Laterality Date   ABDOMINAL HYSTERECTOMY     BRONCHIAL BIOPSY  04/24/2021   Procedure: BRONCHIAL BIOPSIES;  Surgeon: Leslye Peer, MD;  Location: [facility] ENDOSCOPY;  Service: Pulmonary;;   BRONCHIAL BRUSHINGS  04/24/2021   Procedure: BRONCHIAL BRUSHINGS;  Surgeon: Leslye Peer, MD;  Location: [facility] ENDOSCOPY;  Service: Pulmonary;;   BRONCHIAL NEEDLE ASPIRATION BIOPSY  04/24/2021   Procedure: BRONCHIAL NEEDLE ASPIRATION BIOPSIES;  Surgeon: Leslye Peer, MD;  Location: [facility] ENDOSCOPY;  Service: Pulmonary;;   BRONCHIAL WASHINGS  04/24/2021   Procedure: BRONCHIAL WASHINGS;  Surgeon: Leslye Peer, MD;  Location: [facility] ENDOSCOPY;  Service: Pulmonary;;   CESAREAN SECTION  11/09/1990   DILATION AND CURETTAGE OF UTERUS Bilateral 09/09/1996   FIDUCIAL MARKER PLACEMENT  04/24/2021   Procedure: FIDUCIAL MARKER PLACEMENT;  Surgeon: Leslye Peer, MD;  Location: [facility] ENDOSCOPY;  Service: Pulmonary;;   TONSILLECTOMY  12/10/1977   VIDEO BRONCHOSCOPY WITH ENDOBRONCHIAL NAVIGATION Bilateral 04/24/2021   Procedure: VIDEO BRONCHOSCOPY WITH ENDOBRONCHIAL NAVIGATION;  Surgeon: Leslye Peer, MD;  Location: [facility] ENDOSCOPY;  Service: Pulmonary;  Laterality: Bilateral;    REVIEW OF SYSTEMS:   Constitutional: Positive for fatigue 1 week following treatment typically and some decreased appetite a few days following treatment. Negative for  chills and  fever. HENT: Negative for mouth sores, nosebleeds, sore throat and trouble swallowing.   Eyes: Positive for some blurry vision and is expected to follow-up with her eye doctor soon. Negative for eye problems and icterus.  Respiratory: Positive for stable intermittent shortness of breath with certain activities. Positive for cough. Negative for hemoptysis and wheezing.  Cardiovascular: Negative for chest pain (gets chest tightness routinely after treatment) and leg swelling.  Gastrointestinal: Positive for  diarrhea from antibiotic last week and earlier this week (resolved). Negative for abdominal pain and constipation. Genitourinary: Negative for bladder incontinence, difficulty urinating, dysuria, frequency and hematuria.   Musculoskeletal: Positive for back pain following treatment. Negative for  gait problem, neck pain and neck stiffness.  Skin: Positive for skin infection on right shoulder and boil in groin.  Neurological: Negative for dizziness, headaches, extremity weakness, gait problem, light-headedness and seizures.  Hematological: Negative for adenopathy. Does not bruise/bleed easily.  Psychiatric/Behavioral: Negative for confusion, depression and sleep disturbance. The patient is not nervous/anxious.   PHYSICAL EXAMINATION:  Blood pressure 138/83, pulse 99, temperature (!) 97.5 F (36.4 C), temperature source Oral, resp. rate 18, height 5' (1.524 m), weight 103 lb 5 oz (46.9 kg), SpO2 100%.  ECOG PERFORMANCE STATUS: 1  Physical Exam  Constitutional: Oriented to person, place, and time and thin appearing female, and in no distress.  HENT:  Head: Normocephalic and atraumatic.  Mouth/Throat: Oropharynx is clear and moist. No oropharyngeal exudate.  Eyes: Conjunctivae are normal. Right eye exhibits no discharge. Left eye exhibits no discharge. No scleral icterus.  Neck: Normal range of motion. Neck supple.  Cardiovascular: Normal rate, regular rhythm, normal heart sounds and intact distal pulses.   Pulmonary/Chest: Effort normal and breath sounds normal. No respiratory distress. No wheezes. No rales.  Abdominal: Soft. Bowel sounds are normal. Exhibits no distension and no mass. There is no tenderness.  Musculoskeletal: Normal range of motion. Exhibits no edema.  Lymphadenopathy:    No cervical adenopathy.  Neurological: Alert and oriented to person, place, and time. Exhibits normal muscle tone. Gait normal. Coordination normal.  Skin: Skin is warm and dry. No rash noted. Not  diaphoretic. No erythema. No pallor.  Psychiatric: Mood, memory and judgment normal.  Vitals reviewed.    LABORATORY DATA: Lab Results  Component Value Date   WBC 9.0 10/23/2023   HGB 12.4 10/23/2023   HCT 39.0 10/23/2023   MCV 95.4 10/23/2023   PLT 293 10/23/2023      Chemistry      Component Value Date/Time   NA 140 11/13/2023 1014   NA 140 03/13/2021 0000   K 2.9 (L) 11/13/2023 1014   CL 103 11/13/2023 1014   CO2 27 11/13/2023 1014   BUN 6 11/13/2023 1014   BUN 10 03/13/2021 0000   CREATININE 0.72 11/13/2023 1014   GLU 107 03/13/2021 0000      Component Value Date/Time  CALCIUM 9.1 11/13/2023 1014   ALKPHOS 113 11/13/2023 1014   AST 18 11/13/2023 1014   ALT 10 11/13/2023 1014   BILITOT 0.4 11/13/2023 1014       RADIOGRAPHIC STUDIES:  CT CHEST ABDOMEN PELVIS W CONTRAST  Result Date: 10/22/2023 CLINICAL DATA:  Stage IV lung cancer. Ongoing chemotherapy. No current complaints. * Tracking Code: BO * EXAM: CT CHEST, ABDOMEN, AND PELVIS WITH CONTRAST TECHNIQUE: Multidetector CT imaging of the chest, abdomen and pelvis was performed following the standard protocol during bolus administration of intravenous contrast. RADIATION DOSE REDUCTION: This exam was performed according to the departmental dose-optimization program which includes automated exposure control, adjustment of the mA and/or kV according to patient size and/or use of iterative reconstruction technique. CONTRAST:  75mL OMNIPAQUE IOHEXOL 300 MG/ML  SOLN COMPARISON:  CT 07/17/2023 and older. FINDINGS: CT CHEST FINDINGS Cardiovascular: Heart is nonenlarged. No significant pericardial effusion. Atherosclerotic changes along the normal caliber thoracic aorta with partially calcified plaque. Mediastinum/Nodes: Preserved thyroid gland. Normal caliber thoracic esophagus. No specific abnormal lymph node enlargement identified in the axillary region, hilum. Mediastinal nodes are again seen. Lymph node just anterior to the  right side of the carina which previously measured 11 mm in short axis, today measures 12 mm in short axis and length of 18 mm, similar. Other small mediastinal nodes are stable including prevascular, subcarinal. These under a cm in short axis. No new nodal enlargement. Lungs/Pleura: Once again left lung has some areas of patchy ground-glass. Area lateral in the left upper lobe which previously had an AP dimension of 20 mm, today on series 4 image 41 measures 20 mm once again. Other areas are similar. Few air cysts identified. Right lung once again has a spiculated area with retraction along the superior segment of the right lower lobe abutting the major fissure. There is fiduciary marker just caudal and posterior to this. On the prior examination the area measured 2.3 x 1.6 cm and today when measured in the same fashion and same technique would measure 2.3 x 1.5 cm, similar on series 4, image 65. Smaller area posteromedial right upper lobe is also stable on series 4, image 36. There is a fiduciary marker just caudal and lateral to the lesion. Additional smaller areas of nodular ground-glass are stable such as right lung apex series 4, image 28. More solid rounded nodule right lung series 4, image 56 measuring 3-4 mm is also stable. No new right-sided lung nodule. Bilateral there are no areas of consolidation, pneumothorax or effusion. Musculoskeletal: Mild degenerative changes of the spine and sternum. CT ABDOMEN PELVIS FINDINGS Hepatobiliary: No focal liver abnormality is seen. No gallstones, gallbladder wall thickening, or biliary dilatation. Patent portal vein. Pancreas: Unremarkable. No pancreatic ductal dilatation or surrounding inflammatory changes. Spleen: Normal in size without focal abnormality. Adrenals/Urinary Tract: Stable bilateral adrenal nodules. Again left 2.1 cm and right 1.8 cm. Previously described as adenomas. Extrarenal pelvis seen of the right kidney. No enhancing renal mass or collecting  system dilatation. The ureters have normal course and caliber extending down to the urinary bladder. Preserved contour to the urinary bladder. Stomach/Bowel: Stomach is within normal limits. Appendix appears normal. No evidence of bowel wall thickening, distention, or inflammatory changes. Vascular/Lymphatic: Aortic atherosclerosis. No enlarged abdominal or pelvic lymph nodes. Reproductive: Status post hysterectomy. No adnexal masses. Other: No free air or free fluid. Musculoskeletal: Scattered degenerative changes of the spine and pelvis. IMPRESSION: Overall no significant interval change. Stable enlarged right paratracheal lymph node. No new nodal  enlargement. Stable areas of lung nodularity again greatest in the right lung. No new lung masses. Fatty liver infiltration. No bowel obstruction, free air or free fluid. Electronically Signed   By: Karen Kays M.D.   On: 10/22/2023 17:07     ASSESSMENT/PLAN:  This is a very pleasant 52 year old Caucasian female diagnosed with stage IV (T4, N2, M1 a) non-small cell lung cancer, adenocarcinoma presented with multifocal disease involving the right upper lobe, right lower lobe as well as suspicious lower paratracheal lymphadenopathy and groundglass nodules in the left lung diagnosed in May 2022. The patient had molecular studies performed by [facility] and that showed no detectable mutation but this is likely secondary to low-level circulating free tumor DNA. If the patient has disease progression in the future, then we will likely retest her for molecular studies.    The patient is currently undergoing systemic chemotherapy with carboplatin for an AUC 5, Alimta 500 mg per metered square, and and Avastin 15 mg/kg IV every 3 weeks.  She is status post 41 cycles.  Starting from cycle #7, the patient has been on maintenance treatment with Alimta and Avastin.  The patient has ongoing boil in her groin bilaterally and a cyst/boil infected on her right shoulder.  She  recently completed antibiotics earlier this week.  She is scheduled to see dermatology on Friday.  Would not feel comfortable giving her chemotherapy with the ongoing infection, especially the lesion on her right shoulder.  Additionally I would not feel comfortable giving her Avastin due to the effects of delayed wound healing.  The patient I contemplated sending her another round of antibiotics with Keflex; however, since she is seeing her dermatologist soon decided to wait and wait further recommendations and possible culture.  She likely is going to have the boil on her left groin lanced later this week and I do not want to delay the wound healing process with her Avastin.  The patient and I agreed to delay treatment by about 2 weeks.  If she has improvement in her skin infections at that time, then we could consider resuming treatment with cycle #42.  Refilled her oxycodone. Her potassium was low. I have sent potassium to the pharmacy to take 1 tablet BID for 10 days.   The patient was advised to call immediately if she has any concerning symptoms in the interval. The patient voices understanding of current disease status and treatment options and is in agreement with the current care plan. All questions were answered. The patient knows to call the clinic with any problems, questions or concerns. We can certainly see the patient much sooner if necessary  She is going to schedule follow-up with her eye doctor soon for her vision changes.   The patient was advised to call immediately if she has any concerning symptoms in the interval. The patient voices understanding of current disease status and treatment options and is in agreement with the current care plan. All questions were answered. The patient knows to call the clinic with any problems, questions or concerns. We can certainly see the patient much sooner if necessary       Orders Placed This Encounter  Procedures   CBC with Differential  (Cancer Center Only)    Standing Status:   Future    Standing Expiration Date:   11/26/2024   CMP (Cancer Center only)    Standing Status:   Future    Standing Expiration Date:   11/26/2024   Total Protein, Urine  dipstick    Standing Status:   Future    Standing Expiration Date:   11/26/2024     The total time spent in the appointment was 30-39 minutes.   Rebie Peale L Cassadi Purdie, PA-C 11/13/23

## 2023-11-11 ENCOUNTER — Ambulatory Visit: Payer: Commercial Managed Care - PPO

## 2023-11-11 VITALS — BP 153/95 | HR 88 | Temp 97.6°F | Resp 16 | Ht 60.0 in | Wt 102.4 lb

## 2023-11-11 DIAGNOSIS — M810 Age-related osteoporosis without current pathological fracture: Secondary | ICD-10-CM

## 2023-11-11 MED ORDER — DENOSUMAB 60 MG/ML ~~LOC~~ SOSY
60.0000 mg | PREFILLED_SYRINGE | Freq: Once | SUBCUTANEOUS | Status: AC
  Administered 2023-11-11: 60 mg via SUBCUTANEOUS
  Filled 2023-11-11: qty 1

## 2023-11-11 NOTE — Progress Notes (Signed)
Diagnosis: Osteoporosis  Provider:  Chilton Greathouse MD  Procedure: Injection  Prolia (Denosumab), Dose: 60 mg, Site: subcutaneous, Number of injections: 1  Post Care:  left arm injection  Discharge: Condition: Good, Destination: Home . AVS Provided  Performed by:  Rico Ala, LPN

## 2023-11-12 ENCOUNTER — Other Ambulatory Visit: Payer: Self-pay

## 2023-11-13 ENCOUNTER — Telehealth: Payer: Self-pay

## 2023-11-13 ENCOUNTER — Inpatient Hospital Stay: Payer: Commercial Managed Care - PPO

## 2023-11-13 ENCOUNTER — Inpatient Hospital Stay: Payer: Commercial Managed Care - PPO | Attending: Hematology and Oncology | Admitting: Physician Assistant

## 2023-11-13 VITALS — BP 138/83 | HR 99 | Temp 97.5°F | Resp 18 | Ht 60.0 in | Wt 103.3 lb

## 2023-11-13 DIAGNOSIS — L989 Disorder of the skin and subcutaneous tissue, unspecified: Secondary | ICD-10-CM | POA: Diagnosis not present

## 2023-11-13 DIAGNOSIS — C3411 Malignant neoplasm of upper lobe, right bronchus or lung: Secondary | ICD-10-CM | POA: Insufficient documentation

## 2023-11-13 DIAGNOSIS — Z5111 Encounter for antineoplastic chemotherapy: Secondary | ICD-10-CM | POA: Insufficient documentation

## 2023-11-13 DIAGNOSIS — E876 Hypokalemia: Secondary | ICD-10-CM

## 2023-11-13 DIAGNOSIS — G893 Neoplasm related pain (acute) (chronic): Secondary | ICD-10-CM | POA: Diagnosis not present

## 2023-11-13 DIAGNOSIS — L089 Local infection of the skin and subcutaneous tissue, unspecified: Secondary | ICD-10-CM

## 2023-11-13 DIAGNOSIS — Z5112 Encounter for antineoplastic immunotherapy: Secondary | ICD-10-CM | POA: Insufficient documentation

## 2023-11-13 DIAGNOSIS — C3491 Malignant neoplasm of unspecified part of right bronchus or lung: Secondary | ICD-10-CM

## 2023-11-13 HISTORY — DX: Local infection of the skin and subcutaneous tissue, unspecified: L08.9

## 2023-11-13 LAB — CMP (CANCER CENTER ONLY)
ALT: 10 U/L (ref 0–44)
AST: 18 U/L (ref 15–41)
Albumin: 3.5 g/dL (ref 3.5–5.0)
Alkaline Phosphatase: 113 U/L (ref 38–126)
Anion gap: 10 (ref 5–15)
BUN: 6 mg/dL (ref 6–20)
CO2: 27 mmol/L (ref 22–32)
Calcium: 9.1 mg/dL (ref 8.9–10.3)
Chloride: 103 mmol/L (ref 98–111)
Creatinine: 0.72 mg/dL (ref 0.44–1.00)
GFR, Estimated: >60 mL/min (ref >60)
Glucose, Bld: 131 mg/dL — ABNORMAL HIGH (ref 70–99)
Potassium: 2.9 mmol/L — ABNORMAL LOW (ref 3.5–5.1)
Sodium: 140 mmol/L (ref 135–145)
Total Bilirubin: 0.4 mg/dL (ref <1.2)
Total Protein: 7.2 g/dL (ref 6.5–8.1)

## 2023-11-13 LAB — CBC WITH DIFFERENTIAL (CANCER CENTER ONLY)
Abs Immature Granulocytes: 0.04 10*3/uL (ref 0.00–0.07)
Basophils Absolute: 0.1 10*3/uL (ref 0.0–0.1)
Basophils Relative: 1 %
Eosinophils Absolute: 0.1 10*3/uL (ref 0.0–0.5)
Eosinophils Relative: 1 %
HCT: 38 % (ref 36.0–46.0)
Hemoglobin: 12.1 g/dL (ref 12.0–15.0)
Immature Granulocytes: 0 %
Lymphocytes Relative: 23 %
Lymphs Abs: 2.4 10*3/uL (ref 0.7–4.0)
MCH: 30.3 pg (ref 26.0–34.0)
MCHC: 31.8 g/dL (ref 30.0–36.0)
MCV: 95.2 fL (ref 80.0–100.0)
Monocytes Absolute: 1.2 10*3/uL — ABNORMAL HIGH (ref 0.1–1.0)
Monocytes Relative: 12 %
Neutro Abs: 6.5 10*3/uL (ref 1.7–7.7)
Neutrophils Relative %: 63 %
Platelet Count: 323 10*3/uL (ref 150–400)
RBC: 3.99 MIL/uL (ref 3.87–5.11)
RDW: 17.2 % — ABNORMAL HIGH (ref 11.5–15.5)
Smear Review: NORMAL
WBC Count: 10.3 10*3/uL (ref 4.0–10.5)
nRBC: 0 % (ref 0.0–0.2)

## 2023-11-13 LAB — TOTAL PROTEIN, URINE DIPSTICK: Protein, ur: 100 mg/dL — AB

## 2023-11-13 MED ORDER — POTASSIUM CHLORIDE CRYS ER 20 MEQ PO TBCR: 20.0000 meq | EXTENDED_RELEASE_TABLET | Freq: Two times a day (BID) | ORAL | 0 refills | Status: DC

## 2023-11-13 MED ORDER — OXYCODONE-ACETAMINOPHEN 5-325 MG PO TABS: 1.0000 | ORAL_TABLET | Freq: Three times a day (TID) | ORAL | 0 refills | Status: AC | PRN

## 2023-11-13 NOTE — Telephone Encounter (Signed)
Tried to contact patient in regards to low potassium.  LVM. Per Cassie, PA- potassium is low. Sent potassium to the pharmacy to take BID for 10 days.

## 2023-11-14 ENCOUNTER — Telehealth: Payer: Self-pay | Admitting: Medical Oncology

## 2023-11-14 NOTE — Telephone Encounter (Signed)
Pt stated she cannot get her pain medication filled.

## 2023-11-14 NOTE — Telephone Encounter (Signed)
Pt said she will pay out of pocket for her oxycodone # 30.Pharmacy notified.

## 2023-11-18 ENCOUNTER — Encounter: Payer: Self-pay | Admitting: Internal Medicine

## 2023-11-18 ENCOUNTER — Encounter: Payer: Self-pay | Admitting: Sports Medicine

## 2023-11-21 ENCOUNTER — Other Ambulatory Visit: Payer: Self-pay | Admitting: Physician Assistant

## 2023-11-21 DIAGNOSIS — C3491 Malignant neoplasm of unspecified part of right bronchus or lung: Secondary | ICD-10-CM

## 2023-11-21 DIAGNOSIS — E876 Hypokalemia: Secondary | ICD-10-CM

## 2023-11-21 DIAGNOSIS — G893 Neoplasm related pain (acute) (chronic): Secondary | ICD-10-CM

## 2023-11-22 NOTE — Progress Notes (Unsigned)
[facility] OFFICE PROGRESS NOTE  Kirsten Peak, PA-C [address] [place] [address]  DIAGNOSIS: Stage IV (T4, N2, M1 a) non-small cell lung cancer, adenocarcinoma presented with multifocal disease involving the right upper lobe, right lower lobe as well as suspicious lower paratracheal lymphadenopathy and groundglass nodules in the left lung diagnosed in May 2022. The patient had molecular studies performed by [facility] and that showed no detectable mutation but this is likely secondary to low-level circulating free tumor DNA.   PRIOR THERAPY: None  CURRENT THERAPY: Palliative systemic chemotherapy with carboplatin for AUC of 5, Alimta 500 Mg/M2 and Avastin 15 Mg/KG every 3 weeks.  The patient is not a great candidate for immunotherapy in the event that she has a genetic mutation.  She is status post 41 cycles. Starting from cycle #6, the patient will start maintenance Alimta and Avastin.    INTERVAL HISTORY: Kirsten Williams 52 y.o. female returns to the clinic today for a follow-up visit.  The patient was last seen 2 weeks ago.  In summary the patient is currently on chemotherapy.  However, at her last appointment she had several skin infections and boils that were infected.  She had a pretty significant skin infection on her right shoulder.  She additionally had some boils in her groin bilaterally left greater than right.  She had a follow-up appointment with her dermatologist 2 days later.  Therefore, we deferred treatment by 2 weeks to allow time for her skin infections to be treated.  I was concerned about giving her chemotherapy and her biologic with avastin due to concerns with immunocompromised state and delayed wound healing for possible debridement of the wound at her upcoming dermatology appointment. They did "dig it out" per patient report. Overall, this area is not completely healed but she states it is looking better at this time and no longer painful. The  patient is currently on doxycycline. She is taking this once per day for 30 days. She still has the boils in her groin but they are a little better.   Overall, she states she is feeling really good today and she is eager to resume her treatment. The patient denies any fevers.  She denies any changes with her intermittent night sweats and overall they have been better recently. She lost 2 pounds although she states her appetite has improved. She is disappointed that she has not gained more weight.  She denies any nausea or vomiting.  Denies any changes with her dyspnea.  She did have some chest congestion and cough at her last appointment. Overall she does feel like her cough is improving.  She has not needed to take robitussin in a week.    She is here today for evaluation repeat blood work before undergoing cycle #42.   MEDICAL HISTORY: Past Medical History:  Diagnosis Date   Adenocarcinoma, lung, right (HCC) 05/03/2021   Kirsten Williams is a 52 y.o. female with a history of lung nodules dating back to 2019 who is referred in consultation with Kirsten Peak, PA-C for assessment and management. She is a former smoker having quit in 2016. 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 She missed her screening CT last year and imaging was ordered last month. CT chest from 02-16-2021 reveals pro   Anxiety    GERD (gastroesophageal reflux disease)    SLE (systemic lupus erythematosus) (HCC) 05/03/2021   SLE (systemic lupus erythematosus) (HCC) 05/03/2021  ALLERGIES:  is allergic to clindamycin/lincomycin and carboplatin.  MEDICATIONS:  Current Outpatient Medications  Medication Sig Dispense Refill   amLODipine (NORVASC) 5 MG tablet Take 5 mg by mouth daily.     B Complex Vitamins (B COMPLEX 50 PO) Take by mouth.     Calcium Carbonate-Vit D-Min (CALCIUM 1200 PO) Take 1 capsule by mouth daily.     cholecalciferol (VITAMIN D3) 25 MCG (1000 UNIT) tablet Take 2,000 Units by  mouth daily.     cyclobenzaprine (FLEXERIL) 10 MG tablet Take 10 mg by mouth at bedtime as needed for muscle spasms.     denosumab (PROLIA) 60 MG/ML SOSY injection Inject 60 mg into the skin every 6 (six) months.     dexamethasone (DECADRON) 4 MG tablet 4 mg p.o. twice daily the day before, day of and day after the chemotherapy every 3 weeks. 40 tablet 2   dicyclomine (BENTYL) 10 MG capsule Take 10 mg by mouth every 6 (six) hours as needed.     diphenhydrAMINE (BENADRYL) 25 MG tablet Take 25 mg by mouth daily as needed for allergies.     famotidine (PEPCID) 20 MG tablet Take 20 mg by mouth at bedtime.     fluticasone (FLONASE) 50 MCG/ACT nasal spray Place 2 sprays into both nostrils daily.     folic acid (FOLVITE) 1 MG tablet TAKE 1 TABLET(1 MG) BY MOUTH DAILY 30 tablet 4   gabapentin (NEURONTIN) 300 MG capsule Take 300 mg by mouth 3 (three) times daily. Patient takes once or twice a day.     hydrocortisone 1 % lotion Apply 1 application topically 2 (two) times daily. 118 mL 0   hydroxychloroquine (PLAQUENIL) 200 MG tablet Take 200 mg by mouth 2 (two) times daily.     Multiple Vitamins-Minerals (MULTI ADULT GUMMIES) CHEW Chew 2 tablets by mouth daily.     omeprazole (PRILOSEC) 40 MG capsule Take 40 mg by mouth daily as needed (acid reflux).     oxyCODONE-acetaminophen (PERCOCET/ROXICET) 5-325 MG tablet Take 1 tablet by mouth every 8 (eight) hours as needed for severe pain (pain score 7-10). 30 tablet 0   Probiotic Product (PROBIOTIC-10 PO) Take by mouth.     prochlorperazine (COMPAZINE) 10 MG tablet TAKE 1 TABLET(10 MG) BY MOUTH EVERY 6 HOURS AS NEEDED 30 tablet 2   rizatriptan (MAXALT-MLT) 10 MG disintegrating tablet Take 10 mg by mouth 2 (two) times daily as needed.     tetrahydrozoline 0.05 % ophthalmic solution Place 1 drop into both eyes once. Systane Eye Drops once daily both eyes     triamcinolone ointment (KENALOG) 0.5 % Apply topically 3 (three) times daily.     DULoxetine (CYMBALTA) 30  MG capsule Take 1 capsule (30 mg total) by mouth at bedtime. (Patient not taking: Reported on 11/27/2023) 30 capsule 3   potassium chloride SA (KLOR-CON M) 20 MEQ tablet Take 1 tablet (20 mEq total) by mouth daily. (Patient not taking: Reported on 11/27/2023) 6 tablet 0   potassium chloride SA (KLOR-CON M) 20 MEQ tablet Take 1 tablet (20 mEq total) by mouth 2 (two) times daily. (Patient not taking: Reported on 11/27/2023) 20 tablet 0   No current facility-administered medications for this visit.    SURGICAL HISTORY:  Past Surgical History:  Procedure Laterality Date   ABDOMINAL HYSTERECTOMY     BRONCHIAL BIOPSY  04/24/2021   Procedure: BRONCHIAL BIOPSIES;  Surgeon: Leslye Peer, MD;  Location: [facility] ENDOSCOPY;  Service: Pulmonary;;   BRONCHIAL BRUSHINGS  04/24/2021  Procedure: BRONCHIAL BRUSHINGS;  Surgeon: Leslye Peer, MD;  Location: [facility] ENDOSCOPY;  Service: Pulmonary;;   BRONCHIAL NEEDLE ASPIRATION BIOPSY  04/24/2021   Procedure: BRONCHIAL NEEDLE ASPIRATION BIOPSIES;  Surgeon: Leslye Peer, MD;  Location: [facility] ENDOSCOPY;  Service: Pulmonary;;   BRONCHIAL WASHINGS  04/24/2021   Procedure: BRONCHIAL WASHINGS;  Surgeon: Leslye Peer, MD;  Location: [facility] ENDOSCOPY;  Service: Pulmonary;;   CESAREAN SECTION  11/09/1990   DILATION AND CURETTAGE OF UTERUS Bilateral 09/09/1996   FIDUCIAL MARKER PLACEMENT  04/24/2021   Procedure: FIDUCIAL MARKER PLACEMENT;  Surgeon: Leslye Peer, MD;  Location: [facility] ENDOSCOPY;  Service: Pulmonary;;   TONSILLECTOMY  12/10/1977   VIDEO BRONCHOSCOPY WITH ENDOBRONCHIAL NAVIGATION Bilateral 04/24/2021   Procedure: VIDEO BRONCHOSCOPY WITH ENDOBRONCHIAL NAVIGATION;  Surgeon: Leslye Peer, MD;  Location: [facility] ENDOSCOPY;  Service: Pulmonary;  Laterality: Bilateral;    REVIEW OF SYSTEMS:   Constitutional: Positive for fatigue 1 week following treatment typically and some decreased appetite a few days following treatment (appetite better at this time). Negative for   chills and  fever. HENT: Negative for mouth sores, nosebleeds, sore throat and trouble swallowing.   Eyes: Positive for some blurry vision and is expected to follow-up with her eye doctor soon. Negative for eye problems and icterus.  Respiratory: Positive for stable intermittent shortness of breath with certain activities. Positive for improving cough. Negative for hemoptysis and wheezing.  Cardiovascular: Negative for chest pain (gets chest tightness routinely after treatment) and leg swelling.  Gastrointestinal: Negative for abdominal pain, constipation, diarrhea, nausea and vomiting.  Genitourinary: Negative for bladder incontinence, difficulty urinating, dysuria, frequency and hematuria.   Musculoskeletal: Negative for back pain, gait problem, neck pain and neck stiffness.  Skin: Improving skin lesion on rigt shoulder.  Neurological: Negative for dizziness, extremity weakness, gait problem, headaches, light-headedness and seizures.  Hematological: Negative for adenopathy. Does not bruise/bleed easily.  Psychiatric/Behavioral: Negative for confusion, depression and sleep disturbance. The patient is not nervous/anxious.     PHYSICAL EXAMINATION:  Blood pressure 139/87, pulse 95, temperature 98.2 F (36.8 C), temperature source Temporal, resp. rate 17, height 5' (1.524 m), weight 101 lb 9.6 oz (46.1 kg), SpO2 100%.  ECOG PERFORMANCE STATUS: 1  Physical Exam  Constitutional: Oriented to person, place, and time and thin appearing female, and in no distress.  HENT:  Head: Normocephalic and atraumatic.  Mouth/Throat: Oropharynx is clear and moist. No oropharyngeal exudate.  Eyes: Conjunctivae are normal. Right eye exhibits no discharge. Left eye exhibits no discharge. No scleral icterus.  Neck: Normal range of motion. Neck supple.  Cardiovascular: Normal rate, regular rhythm, normal heart sounds and intact distal pulses.   Pulmonary/Chest: Effort normal and breath sounds normal. No  respiratory distress. No wheezes. No rales.  Abdominal: Soft. Bowel sounds are normal. Exhibits no distension and no mass. There is no tenderness.  Musculoskeletal: Normal range of motion. Exhibits no edema.  Lymphadenopathy:    No cervical adenopathy.  Neurological: Alert and oriented to person, place, and time. Exhibits normal muscle tone. Gait normal. Coordination normal.  Skin: Skin is warm and dry. Shoulder lesion today 11/27/23 Not diaphoretic.  No pallor.  Psychiatric: Mood, memory and judgment normal.  Vitals reviewed.  LABORATORY DATA: Lab Results  Component Value Date   WBC 7.5 11/27/2023   HGB 12.4 11/27/2023   HCT 39.4 11/27/2023   MCV 95.9 11/27/2023   PLT 220 11/27/2023      Chemistry      Component Value Date/Time   NA  137 11/27/2023 1339   NA 140 03/13/2021 0000   K 3.6 11/27/2023 1339   CL 104 11/27/2023 1339   CO2 23 11/27/2023 1339   BUN 9 11/27/2023 1339   BUN 10 03/13/2021 0000   CREATININE 0.93 11/27/2023 1339   GLU 107 03/13/2021 0000      Component Value Date/Time   CALCIUM 9.6 11/27/2023 1339   ALKPHOS 94 11/27/2023 1339   AST 18 11/27/2023 1339   ALT 8 11/27/2023 1339   BILITOT 0.3 11/27/2023 1339       RADIOGRAPHIC STUDIES:  No results found.   ASSESSMENT/PLAN:  This is a very pleasant 52 year old Caucasian female diagnosed with stage IV (T4, N2, M1 a) non-small cell lung cancer, adenocarcinoma presented with multifocal disease involving the right upper lobe, right lower lobe as well as suspicious lower paratracheal lymphadenopathy and groundglass nodules in the left lung diagnosed in May 2022. The patient had molecular studies performed by [facility] and that showed no detectable mutation but this is likely secondary to low-level circulating free tumor DNA. If the patient has disease progression in the future, then we will likely retest her for molecular studies.    The patient is currently undergoing systemic chemotherapy with  carboplatin for an AUC 5, Alimta 500 mg per metered square, and and Avastin 15 mg/kg IV every 3 weeks.  She is status post 41 cycles.  Starting from cycle #7, the patient has been on maintenance treatment with Alimta and Avastin.  I reviewed her skin lesion with Dr. Arbutus Ped today given that she is on chemotherapy and Avastin.  The patient is feeling well today and is eager to resume her treatment.  The patient does not have any systemic symptoms.  Per Dr. Arbutus Ped she is okay to resume her treatment today.  However the patient was cautioned should she develop any signs and symptoms of worsening infection with increased drainage, pain, warmth, erythema, fevers, etc. that she needs to be seen immediately.  She is currently on doxycycline under the care of her dermatologist.  Labs were reviewed . Recommend she proceed with cycle 42 today as scheduled. She is ok to treat with urine protein of 100.   We will see her back for labs and follow up in 3 weeks for evaluation before undergoing cycle #43.    The patient was advised to call immediately if she has any concerning symptoms in the interval. The patient voices understanding of current disease status and treatment options and is in agreement with the current care plan. All questions were answered. The patient knows to call the clinic with any problems, questions or concerns. We can certainly see the patient much sooner if necessary         No orders of the defined types were placed in this encounter.    The total time spent in the appointment was 20-29 minutes  Kirsten Blizzard L Sylina Henion, PA-C 11/27/23

## 2023-11-27 ENCOUNTER — Inpatient Hospital Stay: Payer: Commercial Managed Care - PPO

## 2023-11-27 ENCOUNTER — Inpatient Hospital Stay: Payer: Commercial Managed Care - PPO | Admitting: Physician Assistant

## 2023-11-27 VITALS — BP 139/87 | HR 95 | Temp 98.2°F | Resp 17 | Ht 60.0 in | Wt 101.6 lb

## 2023-11-27 DIAGNOSIS — C3491 Malignant neoplasm of unspecified part of right bronchus or lung: Secondary | ICD-10-CM | POA: Diagnosis not present

## 2023-11-27 DIAGNOSIS — Z5111 Encounter for antineoplastic chemotherapy: Secondary | ICD-10-CM | POA: Diagnosis not present

## 2023-11-27 DIAGNOSIS — Z5112 Encounter for antineoplastic immunotherapy: Secondary | ICD-10-CM | POA: Diagnosis not present

## 2023-11-27 LAB — CMP (CANCER CENTER ONLY)
ALT: 8 U/L (ref 0–44)
AST: 18 U/L (ref 15–41)
Albumin: 3.7 g/dL (ref 3.5–5.0)
Alkaline Phosphatase: 94 U/L (ref 38–126)
Anion gap: 10 (ref 5–15)
BUN: 9 mg/dL (ref 6–20)
CO2: 23 mmol/L (ref 22–32)
Calcium: 9.6 mg/dL (ref 8.9–10.3)
Chloride: 104 mmol/L (ref 98–111)
Creatinine: 0.93 mg/dL (ref 0.44–1.00)
GFR, Estimated: >60 mL/min (ref >60)
Glucose, Bld: 124 mg/dL — ABNORMAL HIGH (ref 70–99)
Potassium: 3.6 mmol/L (ref 3.5–5.1)
Sodium: 137 mmol/L (ref 135–145)
Total Bilirubin: 0.3 mg/dL (ref <1.2)
Total Protein: 7.1 g/dL (ref 6.5–8.1)

## 2023-11-27 LAB — CBC WITH DIFFERENTIAL (CANCER CENTER ONLY)
Abs Immature Granulocytes: 0.02 10*3/uL (ref 0.00–0.07)
Basophils Absolute: 0.1 10*3/uL (ref 0.0–0.1)
Basophils Relative: 2 %
Eosinophils Absolute: 0.2 10*3/uL (ref 0.0–0.5)
Eosinophils Relative: 2 %
HCT: 39.4 % (ref 36.0–46.0)
Hemoglobin: 12.4 g/dL (ref 12.0–15.0)
Immature Granulocytes: 0 %
Lymphocytes Relative: 42 %
Lymphs Abs: 3.1 10*3/uL (ref 0.7–4.0)
MCH: 30.2 pg (ref 26.0–34.0)
MCHC: 31.5 g/dL (ref 30.0–36.0)
MCV: 95.9 fL (ref 80.0–100.0)
Monocytes Absolute: 0.8 10*3/uL (ref 0.1–1.0)
Monocytes Relative: 10 %
Neutro Abs: 3.3 10*3/uL (ref 1.7–7.7)
Neutrophils Relative %: 44 %
Platelet Count: 220 10*3/uL (ref 150–400)
RBC: 4.11 MIL/uL (ref 3.87–5.11)
RDW: 17.2 % — ABNORMAL HIGH (ref 11.5–15.5)
WBC Count: 7.5 10*3/uL (ref 4.0–10.5)
nRBC: 0 % (ref 0.0–0.2)

## 2023-11-27 LAB — TOTAL PROTEIN, URINE DIPSTICK: Protein, ur: 100 mg/dL — AB

## 2023-11-27 MED ORDER — PROCHLORPERAZINE MALEATE 10 MG PO TABS
10.0000 mg | ORAL_TABLET | Freq: Once | ORAL | Status: AC
  Administered 2023-11-27: 10 mg via ORAL
  Filled 2023-11-27: qty 1

## 2023-11-27 MED ORDER — SODIUM CHLORIDE 0.9 % IV SOLN
700.0000 mg | Freq: Once | INTRAVENOUS | Status: AC
  Administered 2023-11-27: 700 mg via INTRAVENOUS
  Filled 2023-11-27: qty 28

## 2023-11-27 MED ORDER — SODIUM CHLORIDE 0.9 % IV SOLN: Freq: Once | INTRAVENOUS | Status: AC

## 2023-11-27 MED ORDER — DEXAMETHASONE SODIUM PHOSPHATE 10 MG/ML IJ SOLN
10.0000 mg | Freq: Once | INTRAMUSCULAR | Status: AC
Start: 2023-11-27 — End: 2023-11-27
  Administered 2023-11-27: 10 mg via INTRAVENOUS
  Filled 2023-11-27: qty 1

## 2023-11-27 MED ORDER — SODIUM CHLORIDE 0.9 % IV SOLN
700.0000 mg | Freq: Once | INTRAVENOUS | Status: AC
  Administered 2023-11-27: 700 mg via INTRAVENOUS
  Filled 2023-11-27: qty 20

## 2023-11-27 NOTE — Progress Notes (Signed)
Patient with >10% weight loss.  New BSA: 1.4 m2  Adjust doses to reflect new weight 46.1kg (BSA: 1.4 m2):  Mvasi 15 mg/kg = 700 mg  Alimta 500 mg/m2 = 700 mg  T.O. Cassandra Heilingoetter, PA-C/[employer] [employer]op Yetta Barre, PharmD

## 2023-11-27 NOTE — Progress Notes (Signed)
Per Cassie Heilingoetter, PA-C: OK to treat with elevated urine protein

## 2023-12-05 ENCOUNTER — Other Ambulatory Visit: Payer: Commercial Managed Care - PPO

## 2023-12-05 ENCOUNTER — Ambulatory Visit: Payer: Commercial Managed Care - PPO

## 2023-12-05 ENCOUNTER — Ambulatory Visit: Payer: Commercial Managed Care - PPO | Admitting: Internal Medicine

## 2023-12-13 NOTE — Progress Notes (Signed)
 [facility] OFFICE PROGRESS NOTE  Kirsten Lot, PA-C [address] [place] [address]  DIAGNOSIS: Stage IV (T4, N2, M1 a) non-small cell lung cancer, adenocarcinoma presented with multifocal disease involving the right upper lobe, right lower lobe as well as suspicious lower paratracheal lymphadenopathy and groundglass nodules in the left lung diagnosed in May 2022. The patient had molecular studies performed by [facility] and that showed no detectable mutation but this is likely secondary to low-level circulating free tumor DNA.   PRIOR THERAPY: None   CURRENT THERAPY: Palliative systemic chemotherapy with carboplatin  for AUC of 5, Alimta  500 Mg/M2 and Avastin  15 Mg/KG every 3 weeks.  The patient is not a great candidate for immunotherapy in the event that she has a genetic mutation.  She is status post 42 cycles. Starting from cycle #6, the patient will start maintenance Alimta  and Avastin .    INTERVAL HISTORY: Kirsten Williams 53 y.o. female returns to the clinic today for a follow-up visit.  The patient was last seen 3 weeks ago.  She resumed her treatment with Avastin  and Alimta .  This was held for a few weeks while she had underwent debridement of skin infection and antibiotic treatment.  The area is healing well.  The patient's dermatologist want her on antibiotics with doxycycline although she had some intolerance to this so she may only take this once a day.  She states that she needs to pick up her refill.  Overall the patient states she is feeling good today.  She did have a cold following Thanksgiving and had an ongoing residual cough.  She stopped taking Robitussin because she thought it was too strong for her cough.  Her cough produces clear phlegm. She is wondering if it is due to the weather.  She has a hard time getting her phlegm up and it may cause gagging when trying to spit it out.  She denies any fever, chills, malaise, or shortness of breath.  She overall  states she feels good.  She does try to drink water and tea. She denies significant nasal congestion but does use saline spray if needed.  The patient mentions that she has been having some progressive weight loss despite having a good appetite.  She tries not to overeat though.  She had some intolerance in the past 2 protein supplemental drinks.  She does try to make smoothies every day.  Denies any nausea or vomiting.  She did have some constipation due to eating chili beans and cheese per patient report.  She denies any chest pain or hemoptysis.  She does have a little bit of soreness in her back this morning but has not needed to take anything for this.  Denies any headache or visual changes.  Denies any new rashes.  She is here today for evaluation repeat blood work before undergoing cycle #43.  MEDICAL HISTORY: Past Medical History:  Diagnosis Date   Adenocarcinoma, lung, right (HCC) 05/03/2021   Kirsten Williams is a 53 y.o. female with a history of lung nodules dating back to 2019 who is referred in consultation with Williams Montey, PA-C for assessment and management. She is a former smoker having quit in 2016. 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 She missed her screening CT last year and imaging was ordered last month. CT chest from 02-16-2021 reveals pro   Anxiety    GERD (gastroesophageal reflux disease)    SLE (systemic lupus erythematosus) (  HCC) 05/03/2021   SLE (systemic lupus erythematosus) (HCC) 05/03/2021    ALLERGIES:  is allergic to clindamycin/lincomycin and carboplatin .  MEDICATIONS:  Current Outpatient Medications  Medication Sig Dispense Refill   amLODipine (NORVASC) 5 MG tablet Take 5 mg by mouth daily.     B Complex Vitamins (B COMPLEX 50 PO) Take by mouth.     Calcium  Carbonate-Vit D-Min (CALCIUM  1200 PO) Take 1 capsule by mouth daily.     cholecalciferol (VITAMIN D3) 25 MCG (1000 UNIT) tablet Take 2,000 Units by mouth daily.      cyclobenzaprine (FLEXERIL) 10 MG tablet Take 10 mg by mouth at bedtime as needed for muscle spasms.     denosumab  (PROLIA ) 60 MG/ML SOSY injection Inject 60 mg into the skin every 6 (six) months.     dexamethasone  (DECADRON ) 4 MG tablet 4 mg p.o. twice daily the day before, day of and day after the chemotherapy every 3 weeks. 40 tablet 2   dicyclomine (BENTYL) 10 MG capsule Take 10 mg by mouth every 6 (six) hours as needed.     diphenhydrAMINE  (BENADRYL ) 25 MG tablet Take 25 mg by mouth daily as needed for allergies.     DULoxetine  (CYMBALTA ) 30 MG capsule Take 1 capsule (30 mg total) by mouth at bedtime. (Patient not taking: Reported on 11/27/2023) 30 capsule 3   famotidine  (PEPCID ) 20 MG tablet Take 20 mg by mouth at bedtime.     fluticasone (FLONASE) 50 MCG/ACT nasal spray Place 2 sprays into both nostrils daily.     folic acid  (FOLVITE ) 1 MG tablet TAKE 1 TABLET(1 MG) BY MOUTH DAILY 30 tablet 4   gabapentin (NEURONTIN) 300 MG capsule Take 300 mg by mouth 3 (three) times daily. Patient takes once or twice a day.     hydrocortisone  1 % lotion Apply 1 application topically 2 (two) times daily. 118 mL 0   hydroxychloroquine (PLAQUENIL) 200 MG tablet Take 200 mg by mouth 2 (two) times daily.     Multiple Vitamins-Minerals (MULTI ADULT GUMMIES) CHEW Chew 2 tablets by mouth daily.     omeprazole (PRILOSEC) 40 MG capsule Take 40 mg by mouth daily as needed (acid reflux).     oxyCODONE -acetaminophen  (PERCOCET/ROXICET) 5-325 MG tablet Take 1 tablet by mouth every 8 (eight) hours as needed for severe pain (pain score 7-10). 30 tablet 0   potassium chloride  SA (KLOR-CON  M) 20 MEQ tablet Take 1 tablet (20 mEq total) by mouth daily. (Patient not taking: Reported on 11/27/2023) 6 tablet 0   potassium chloride  SA (KLOR-CON  M) 20 MEQ tablet Take 1 tablet (20 mEq total) by mouth 2 (two) times daily. (Patient not taking: Reported on 11/27/2023) 20 tablet 0   Probiotic Product (PROBIOTIC-10 PO) Take by mouth.      prochlorperazine  (COMPAZINE ) 10 MG tablet TAKE 1 TABLET(10 MG) BY MOUTH EVERY 6 HOURS AS NEEDED 30 tablet 2   rizatriptan (MAXALT-MLT) 10 MG disintegrating tablet Take 10 mg by mouth 2 (two) times daily as needed.     tetrahydrozoline 0.05 % ophthalmic solution Place 1 drop into both eyes once. Systane Eye Drops once daily both eyes     triamcinolone ointment (KENALOG) 0.5 % Apply topically 3 (three) times daily.     No current facility-administered medications for this visit.    SURGICAL HISTORY:  Past Surgical History:  Procedure Laterality Date   ABDOMINAL HYSTERECTOMY     BRONCHIAL BIOPSY  04/24/2021   Procedure: BRONCHIAL BIOPSIES;  Surgeon: Shelah Lamar RAMAN, MD;  Location: [facility] ENDOSCOPY;  Service: Pulmonary;;   BRONCHIAL BRUSHINGS  04/24/2021   Procedure: BRONCHIAL BRUSHINGS;  Surgeon: Shelah Lamar RAMAN, MD;  Location: [facility] ENDOSCOPY;  Service: Pulmonary;;   BRONCHIAL NEEDLE ASPIRATION BIOPSY  04/24/2021   Procedure: BRONCHIAL NEEDLE ASPIRATION BIOPSIES;  Surgeon: Shelah Lamar RAMAN, MD;  Location: [facility] ENDOSCOPY;  Service: Pulmonary;;   BRONCHIAL WASHINGS  04/24/2021   Procedure: BRONCHIAL WASHINGS;  Surgeon: Shelah Lamar RAMAN, MD;  Location: [facility] ENDOSCOPY;  Service: Pulmonary;;   CESAREAN SECTION  11/09/1990   DILATION AND CURETTAGE OF UTERUS Bilateral 09/09/1996   FIDUCIAL MARKER PLACEMENT  04/24/2021   Procedure: FIDUCIAL MARKER PLACEMENT;  Surgeon: Shelah Lamar RAMAN, MD;  Location: [facility] ENDOSCOPY;  Service: Pulmonary;;   TONSILLECTOMY  12/10/1977   VIDEO BRONCHOSCOPY WITH ENDOBRONCHIAL NAVIGATION Bilateral 04/24/2021   Procedure: VIDEO BRONCHOSCOPY WITH ENDOBRONCHIAL NAVIGATION;  Surgeon: Shelah Lamar RAMAN, MD;  Location: [facility] ENDOSCOPY;  Service: Pulmonary;  Laterality: Bilateral;    REVIEW OF SYSTEMS:   Constitutional: Positive for fatigue 1 week following treatment typically and some decreased appetite a few days following treatment (appetite better at this time). Negative for  chills and  fever.   HENT: Negative for mouth sores, nosebleeds, sore throat and trouble swallowing.   Eyes: Negative for eye problems and icterus.  Respiratory: Positive for stable intermittent shortness of breath with certain activities. Positive for stable but residual cough. Negative for hemoptysis and wheezing.  Cardiovascular: Negative for chest pain (gets chest tightness routinely after treatment but none at this time) and leg swelling.  Gastrointestinal: Positive for constipation. Negative for abdominal pain,  diarrhea, nausea and vomiting.  Genitourinary: Negative for bladder incontinence, difficulty urinating, dysuria, frequency and hematuria.   Musculoskeletal: Positive for mild back soreness. Negative for gait problem, neck pain and neck stiffness.  Skin: Improving skin lesion on rigt shoulder.  Neurological: Negative for dizziness, extremity weakness, gait problem, headaches, light-headedness and seizures.  Hematological: Negative for adenopathy. Does not bruise/bleed easily.  Psychiatric/Behavioral: Negative for confusion, depression and sleep disturbance. The patient is not nervous/anxious.     PHYSICAL EXAMINATION:  There were no vitals taken for this visit.  ECOG PERFORMANCE STATUS: 1  Physical Exam  Constitutional: Oriented to person, place, and time and thin appearing female, and in no distress.  HENT:  Head: Normocephalic and atraumatic.  Mouth/Throat: Oropharynx is clear and moist. No oropharyngeal exudate.  Eyes: Conjunctivae are normal. Right eye exhibits no discharge. Left eye exhibits no discharge. No scleral icterus.  Neck: Normal range of motion. Neck supple.  Cardiovascular: Normal rate, regular rhythm, normal heart sounds and intact distal pulses.   Pulmonary/Chest: Effort normal and breath sounds normal. No respiratory distress. No wheezes. No rales.  Abdominal: Soft. Bowel sounds are normal. Exhibits no distension and no mass. There is no tenderness.  Musculoskeletal: Normal  range of motion. Exhibits no edema.  Lymphadenopathy:    No cervical adenopathy.  Neurological: Alert and oriented to person, place, and time. Exhibits normal muscle tone. Gait normal. Coordination normal.   LABORATORY DATA: Lab Results  Component Value Date   WBC 7.5 11/27/2023   HGB 12.4 11/27/2023   HCT 39.4 11/27/2023   MCV 95.9 11/27/2023   PLT 220 11/27/2023      Chemistry      Component Value Date/Time   NA 137 11/27/2023 1339   NA 140 03/13/2021 0000   K 3.6 11/27/2023 1339   CL 104 11/27/2023 1339   CO2 23 11/27/2023 1339   BUN 9 11/27/2023 1339  BUN 10 03/13/2021 0000   CREATININE 0.93 11/27/2023 1339   GLU 107 03/13/2021 0000      Component Value Date/Time   CALCIUM  9.6 11/27/2023 1339   ALKPHOS 94 11/27/2023 1339   AST 18 11/27/2023 1339   ALT 8 11/27/2023 1339   BILITOT 0.3 11/27/2023 1339       RADIOGRAPHIC STUDIES:  No results found.  Assessment/Plan: This is a very pleasant 53 year old Caucasian female diagnosed with stage IV (T4, N2, M1 a) non-small cell lung cancer, adenocarcinoma presented with multifocal disease involving the right upper lobe, right lower lobe as well as suspicious lower paratracheal lymphadenopathy and groundglass nodules in the left lung diagnosed in May 2022. The patient had molecular studies performed by [facility] and that showed no detectable mutation but this is likely secondary to low-level circulating free tumor DNA. If the patient has disease progression in the future, then we will likely retest her for molecular studies.    The patient is currently undergoing systemic chemotherapy with carboplatin  for an AUC 5, Alimta  500 mg per metered square, and and Avastin  15 mg/kg IV every 3 weeks.  She is status post 42 cycles.  Starting from cycle #7, the patient has been on maintenance treatment with Alimta  and Avastin .    Labs were reviewed . Recommend she proceed with cycle 43 today as scheduled. She is ok to treat with urine  protein of 100. We will recheck her BP prior to infusion of avastin  as we would typically want her BP <150.    We will see her back for labs and follow up in 3 weeks for evaluation before undergoing cycle #44. We discussed arranging for a restaging CT scan following this appointment vs after cycle #44. Due to doing well with treatment, I would typically extend the CT out until after #44 but offered to perform restaging CT due to residual cough since she was sick. Overall, she is well appearing, lung clear on exam, does not have productive cough, fevers, chills, shortness of breath, leukocytosis, etc. She will monitor her symptoms for now since the cough is overall better from when she was previously sick. She is going to try to take mucinex, resume taking her doxycyline for her skin infection, and use delsym if needed. Of course, she understands if she develops new or worsening symptoms, to call back for re-evaluation sooner.   For her back, it feels mildly sore today which started after she got here. May be musculoskeletal. She will use tylenol  and heating pads/salonpa patches.   The patient was advised to call immediately if she has any concerning symptoms in the interval. The patient voices understanding of current disease status and treatment options and is in agreement with the current care plan. All questions were answered. The patient knows to call the clinic with any problems, questions or concerns. We can certainly see the patient much sooner if necessary        No orders of the defined types were placed in this encounter.    The total time spent in the appointment was 20-29 minutes  Tyffany Waldrop L Neko Boyajian, PA-C 12/13/23

## 2023-12-18 ENCOUNTER — Inpatient Hospital Stay: Payer: Commercial Managed Care - PPO

## 2023-12-18 ENCOUNTER — Inpatient Hospital Stay: Payer: Commercial Managed Care - PPO | Attending: Hematology and Oncology

## 2023-12-18 ENCOUNTER — Inpatient Hospital Stay: Payer: Commercial Managed Care - PPO | Admitting: Physician Assistant

## 2023-12-18 VITALS — BP 124/78 | HR 85

## 2023-12-18 VITALS — BP 160/84 | HR 85 | Temp 97.9°F | Resp 15 | Wt 100.9 lb

## 2023-12-18 DIAGNOSIS — Z5111 Encounter for antineoplastic chemotherapy: Secondary | ICD-10-CM

## 2023-12-18 DIAGNOSIS — M329 Systemic lupus erythematosus, unspecified: Secondary | ICD-10-CM | POA: Insufficient documentation

## 2023-12-18 DIAGNOSIS — Z5112 Encounter for antineoplastic immunotherapy: Secondary | ICD-10-CM | POA: Insufficient documentation

## 2023-12-18 DIAGNOSIS — E876 Hypokalemia: Secondary | ICD-10-CM | POA: Diagnosis not present

## 2023-12-18 DIAGNOSIS — C3491 Malignant neoplasm of unspecified part of right bronchus or lung: Secondary | ICD-10-CM

## 2023-12-18 DIAGNOSIS — C3411 Malignant neoplasm of upper lobe, right bronchus or lung: Secondary | ICD-10-CM | POA: Insufficient documentation

## 2023-12-18 LAB — CMP (CANCER CENTER ONLY)
ALT: 8 U/L (ref 0–44)
AST: 17 U/L (ref 15–41)
Albumin: 3.7 g/dL (ref 3.5–5.0)
Alkaline Phosphatase: 94 U/L (ref 38–126)
Anion gap: 8 (ref 5–15)
BUN: 8 mg/dL (ref 6–20)
CO2: 27 mmol/L (ref 22–32)
Calcium: 9.8 mg/dL (ref 8.9–10.3)
Chloride: 104 mmol/L (ref 98–111)
Creatinine: 0.8 mg/dL (ref 0.44–1.00)
GFR, Estimated: >60 mL/min (ref >60)
Glucose, Bld: 116 mg/dL — ABNORMAL HIGH (ref 70–99)
Potassium: 3.4 mmol/L — ABNORMAL LOW (ref 3.5–5.1)
Sodium: 139 mmol/L (ref 135–145)
Total Bilirubin: 0.3 mg/dL (ref 0.0–1.2)
Total Protein: 7.5 g/dL (ref 6.5–8.1)

## 2023-12-18 LAB — CBC WITH DIFFERENTIAL (CANCER CENTER ONLY)
Abs Immature Granulocytes: 0.03 10*3/uL (ref 0.00–0.07)
Basophils Absolute: 0.1 10*3/uL (ref 0.0–0.1)
Basophils Relative: 1 %
Eosinophils Absolute: 0.1 10*3/uL (ref 0.0–0.5)
Eosinophils Relative: 1 %
HCT: 39.2 % (ref 36.0–46.0)
Hemoglobin: 12.6 g/dL (ref 12.0–15.0)
Immature Granulocytes: 0 %
Lymphocytes Relative: 22 %
Lymphs Abs: 2.1 10*3/uL (ref 0.7–4.0)
MCH: 30.2 pg (ref 26.0–34.0)
MCHC: 32.1 g/dL (ref 30.0–36.0)
MCV: 94 fL (ref 80.0–100.0)
Monocytes Absolute: 1.1 10*3/uL — ABNORMAL HIGH (ref 0.1–1.0)
Monocytes Relative: 11 %
Neutro Abs: 6.1 10*3/uL (ref 1.7–7.7)
Neutrophils Relative %: 65 %
Platelet Count: 323 10*3/uL (ref 150–400)
RBC: 4.17 MIL/uL (ref 3.87–5.11)
RDW: 17.1 % — ABNORMAL HIGH (ref 11.5–15.5)
WBC Count: 9.4 10*3/uL (ref 4.0–10.5)
nRBC: 0 % (ref 0.0–0.2)

## 2023-12-18 LAB — TOTAL PROTEIN, URINE DIPSTICK: Protein, ur: 100 mg/dL — AB

## 2023-12-18 MED ORDER — DEXAMETHASONE SODIUM PHOSPHATE 10 MG/ML IJ SOLN
10.0000 mg | Freq: Once | INTRAMUSCULAR | Status: AC
  Administered 2023-12-18: 10 mg via INTRAVENOUS
  Filled 2023-12-18: qty 1

## 2023-12-18 MED ORDER — HEPARIN SOD (PORK) LOCK FLUSH 100 UNIT/ML IV SOLN
500.0000 [IU] | Freq: Once | INTRAVENOUS | Status: DC | PRN
Start: 2023-12-18 — End: 2023-12-18

## 2023-12-18 MED ORDER — PROCHLORPERAZINE MALEATE 10 MG PO TABS
10.0000 mg | ORAL_TABLET | Freq: Once | ORAL | Status: AC
  Administered 2023-12-18: 10 mg via ORAL
  Filled 2023-12-18: qty 1

## 2023-12-18 MED ORDER — SODIUM CHLORIDE 0.9 % IV SOLN
700.0000 mg | Freq: Once | INTRAVENOUS | Status: AC
  Administered 2023-12-18: 700 mg via INTRAVENOUS
  Filled 2023-12-18: qty 12

## 2023-12-18 MED ORDER — PEMETREXED DISODIUM CHEMO INJECTION 500 MG
700.0000 mg | Freq: Once | INTRAVENOUS | Status: AC
  Administered 2023-12-18: 700 mg via INTRAVENOUS
  Filled 2023-12-18: qty 20

## 2023-12-18 MED ORDER — SODIUM CHLORIDE 0.9% FLUSH: 10.0000 mL | INTRAVENOUS | Status: DC | PRN

## 2023-12-18 MED ORDER — SODIUM CHLORIDE 0.9 % IV SOLN: Freq: Once | INTRAVENOUS | Status: AC

## 2023-12-26 IMAGING — CT CT CHEST W/ CM
2 of 5 series · 12 of 36 positions shown, 15 images · IV contrast (APPLIED)
Comparison: Most recent CT chest, abdomen and pelvis 10/20/2021.
03/06/2021 PET-CT.

CLINICAL DATA: Primary Cancer Type: Lung
TECHNIQUE: Multidetector CT imaging of the chest, abdomen and pelvis was
performed following the standard protocol during bolus
administration of intravenous contrast.

[Series 505: thins · axial · 0.93mm/px · z∈[+1079,+1572]mm · 9 of 877 slices shown, 12 images]
[im 88/877  mediastinal]
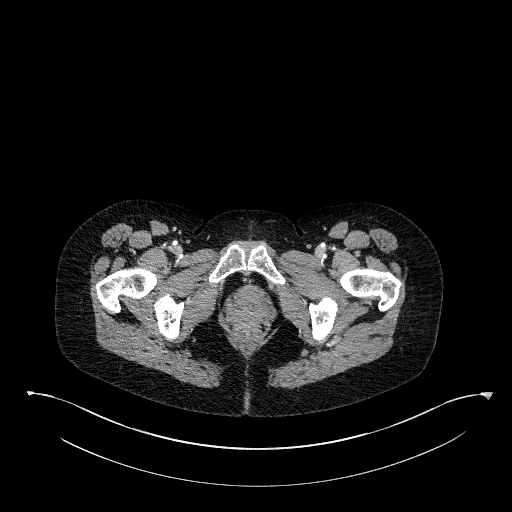
[im 88/877  lung]
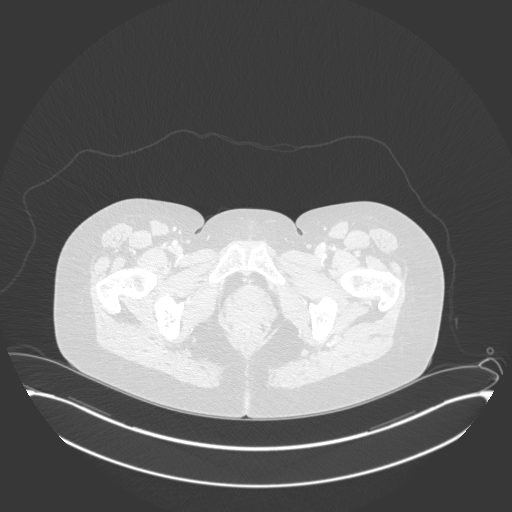
[im 176/877  lung]
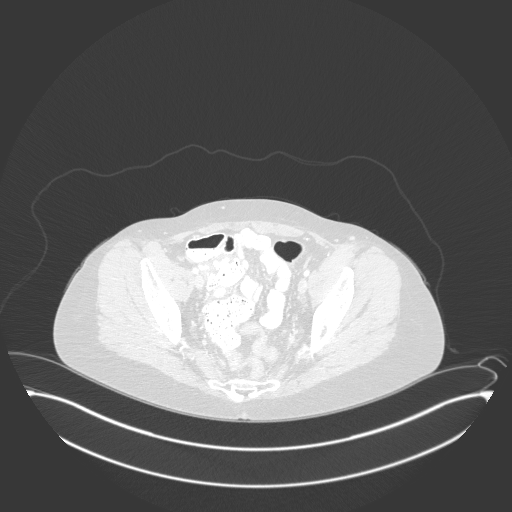
[im 263/877  lung]
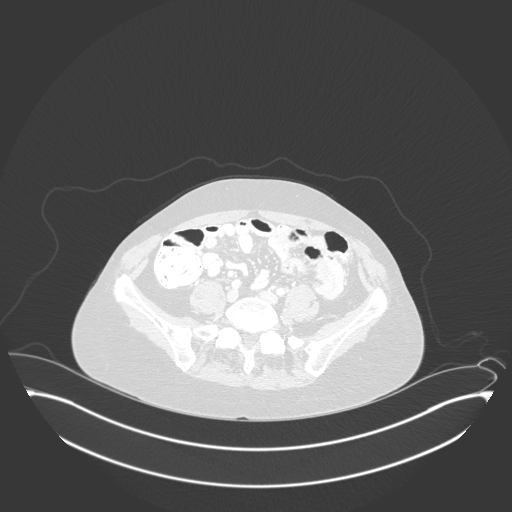
[im 351/877  lung]
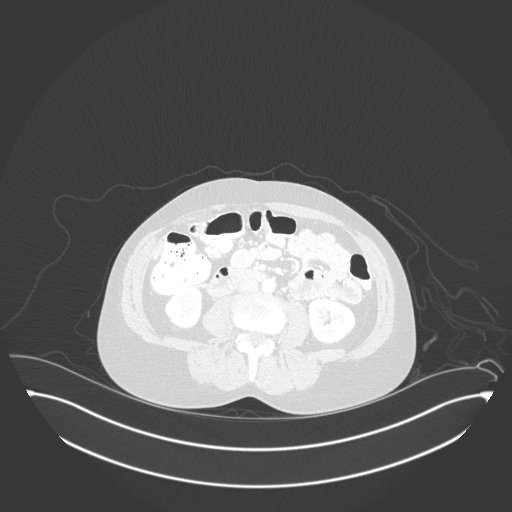
[im 439/877  mediastinal]
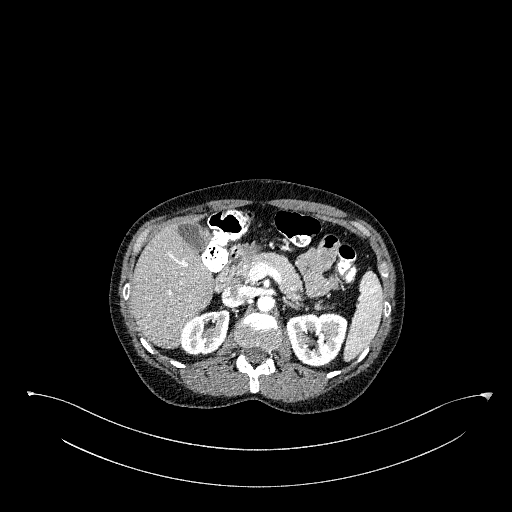
[im 439/877  lung]
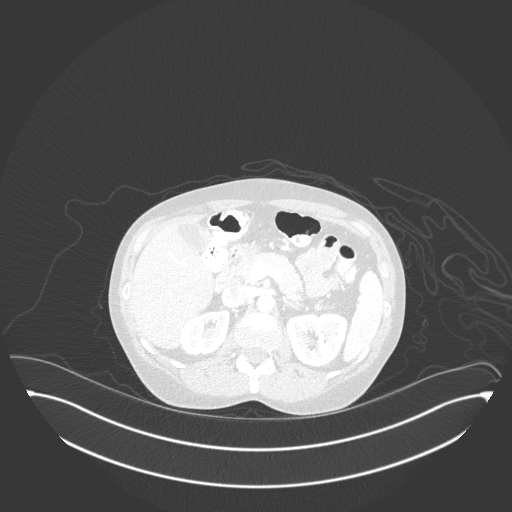
[im 526/877  lung]
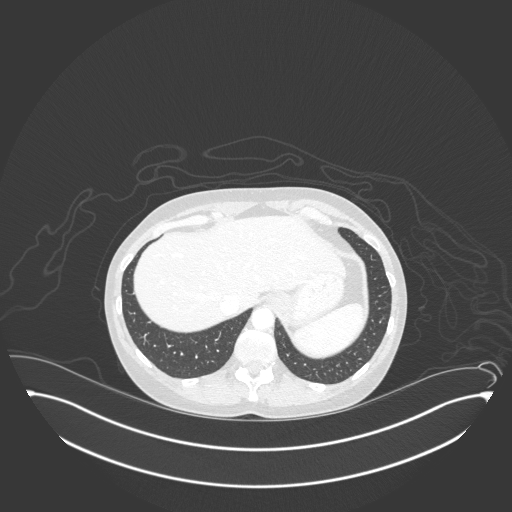
[im 614/877  lung]
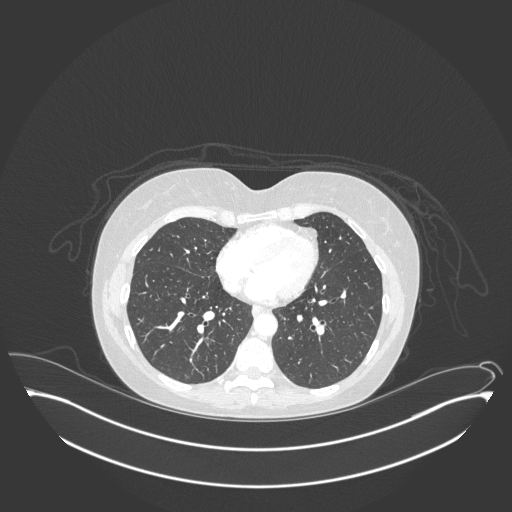
[im 701/877  lung]
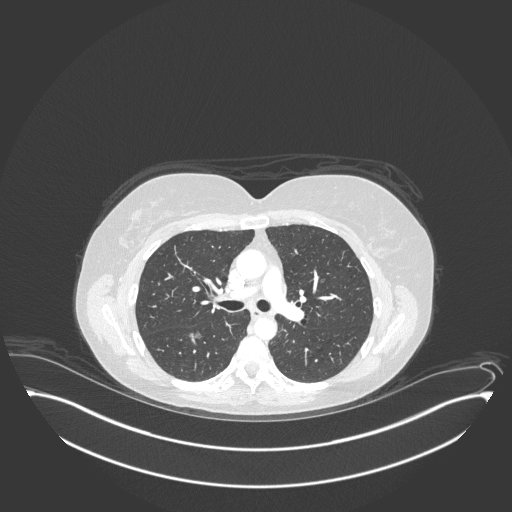
[im 789/877  mediastinal]
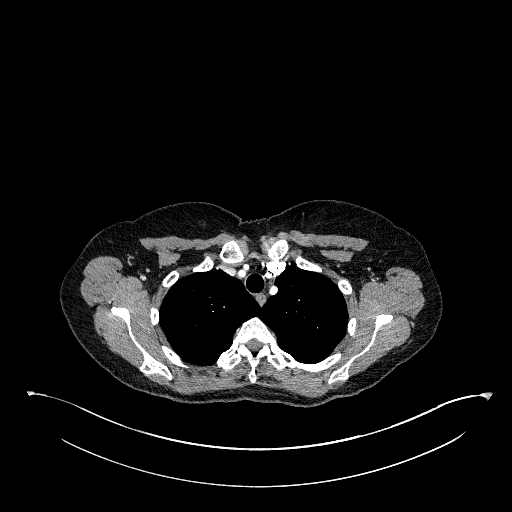
[im 789/877  lung]
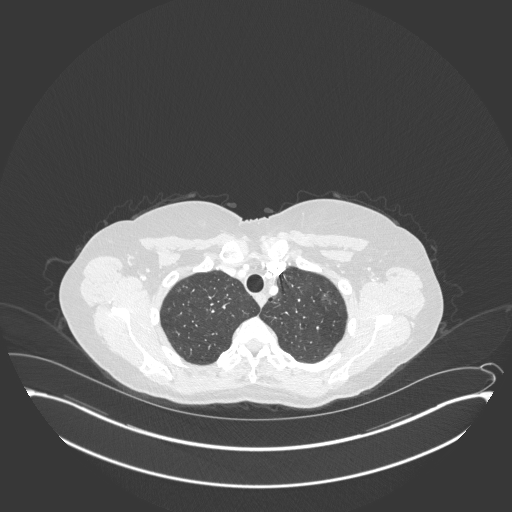

[Series 507: coronals · coronal · 0.76mm/px · 3 of 133 slices shown]
[im 27/133  lung]
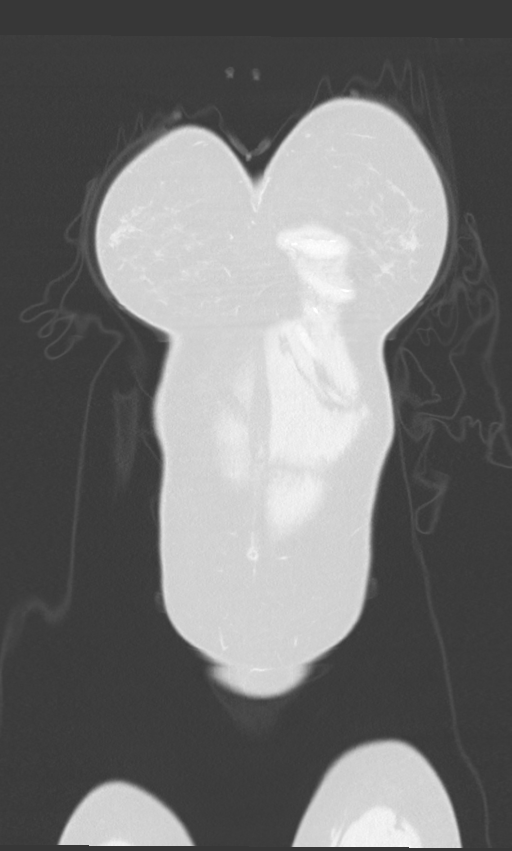
[im 53/133  lung]
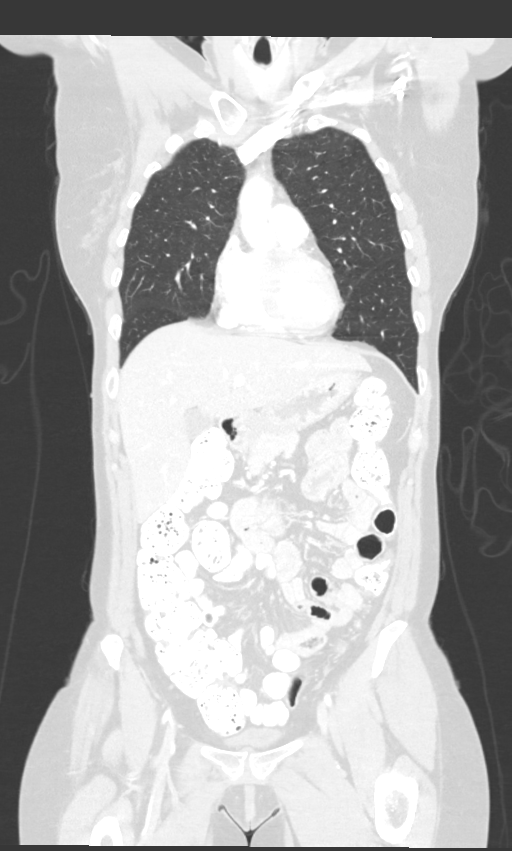
[im 80/133  lung]
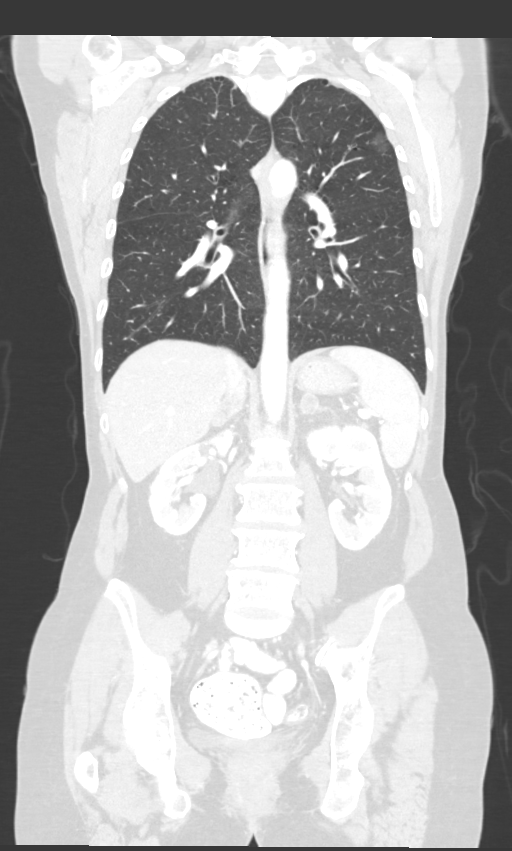

[12 of 36 positions shown; findings below may reference images not displayed]

Imaging Indication: Assess response to therapy

Interval therapy since last imaging? Yes

Initial Cancer Diagnosis

Date: 04/24/2021; Established by: Biopsy-proven

Detailed Pathology: Stage IV non-small cell lung cancer,
adenocarcinoma.

Primary Tumor location:  Right upper and right lower lobe.

Surgeries: Hysterectomy.

Chemotherapy: Yes; Ongoing? Yes; Most recent administration:
12/06/2021

Immunotherapy? No

Radiation therapy? No

EXAM:
CT CHEST, ABDOMEN, AND PELVIS WITH CONTRAST
RADIATION DOSE REDUCTION: This exam was performed according to the
departmental dose-optimization program which includes automated
exposure control, adjustment of the mA and/or kV according to
patient size and/or use of iterative reconstruction technique.

CONTRAST:  100mL OMNIPAQUE IOHEXOL 300 MG/ML  SOLN
FINDINGS: CT CHEST FINDINGS

Cardiovascular: Heart size is normal. There is no significant
pericardial fluid, thickening or pericardial calcification.
Atherosclerotic calcifications in the thoracic aorta. No definite
coronary artery calcifications.

Mediastinum/Nodes: No pathologically enlarged mediastinal or hilar
lymph nodes. Esophagus is unremarkable in appearance. No axillary
lymphadenopathy.

Lungs/Pleura: Again noted are multiple tiny 2-5 mm solid-appearing
pulmonary nodules scattered throughout the lungs bilaterally, which
are stable in size and number. In addition, there are multiple sub
solid lesions in the lungs bilaterally which are grossly similar
compared to the recent prior study. Specific examples include a sub
solid right lower lobe lesion (axial image 61 of series 4) which is
predominantly cystic in appearance and measures 1.5 x 1.1 cm
(unchanged), a 2.1 x 1.3 cm lesion (axial image 40 of series 4) in
the periphery of the left upper lobe (unchanged), and several other
smaller lesions which are also grossly unchanged. No new suspicious
appearing pulmonary nodules or masses are otherwise noted. No acute
consolidative airspace disease. No pleural effusions. Mild diffuse
bronchial wall thickening with very mild centrilobular and
paraseptal emphysema.

Musculoskeletal: There are no aggressive appearing lytic or blastic
lesions noted in the visualized portions of the skeleton.

CT ABDOMEN PELVIS FINDINGS

Hepatobiliary: No suspicious cystic or solid hepatic lesions. No
intra or extrahepatic biliary ductal dilatation. Gallbladder is
normal in appearance.

Pancreas: No pancreatic mass. No pancreatic ductal dilatation. No
pancreatic or peripancreatic fluid collections or inflammatory
changes.

Spleen: Unremarkable.

Adrenals/Urinary Tract: Low-attenuation adrenal nodules bilaterally,
similar to prior examinations, previously not hypermetabolic on
prior PET-CT and characterized as benign adenomas, measuring 1.6 x
2.0 cm on the right and 2.8 x 1.4 cm on the left. Bilateral kidneys
are normal in appearance. No hydroureteronephrosis. Urinary bladder
is normal in appearance.

Stomach/Bowel: The appearance of the stomach is normal. No
pathologic dilatation of small bowel or colon. Normal appendix.

Vascular/Lymphatic: Aortic atherosclerosis, without evidence of
aneurysm or dissection in the abdominal or pelvic vasculature. No
lymphadenopathy noted in the abdomen or pelvis.

Reproductive: Status post hysterectomy. Ovaries are not confidently
identified may be surgically absent or atrophic.

Other: No significant volume of ascites.  No pneumoperitoneum.

Musculoskeletal: There are no aggressive appearing lytic or blastic
lesions noted in the visualized portions of the skeleton.
IMPRESSION: 1. Stable number and size of pulmonary nodules, as above, which
remain highly concerning for multifocal primary bronchogenic
adenocarcinoma.
2. No significant lymphadenopathy or definite signs of metastatic
disease identified in the abdomen or pelvis.
3. Mild diffuse bronchial wall thickening with very mild
centrilobular and paraseptal emphysema; imaging findings suggestive
of underlying COPD.
4. Aortic atherosclerosis.
5. Bilateral adrenal adenomas again noted.
6. Additional incidental findings, similar to prior studies, as
above.

## 2023-12-26 IMAGING — CT CT ABD-PELV W/ CM
3 of 5 series · 13 of 36 positions shown, 15 images · IV contrast (APPLIED)
Comparison: Most recent CT chest, abdomen and pelvis 10/20/2021.
03/06/2021 PET-CT.

CLINICAL DATA: Primary Cancer Type: Lung
TECHNIQUE: Multidetector CT imaging of the chest, abdomen and pelvis was
performed following the standard protocol during bolus
administration of intravenous contrast.

[Series 2: cap with · axial · 0.93mm/px · z∈[+1084,+1564]mm · 8 of 124 slices shown, 10 images]
[im 14/124  mediastinal]
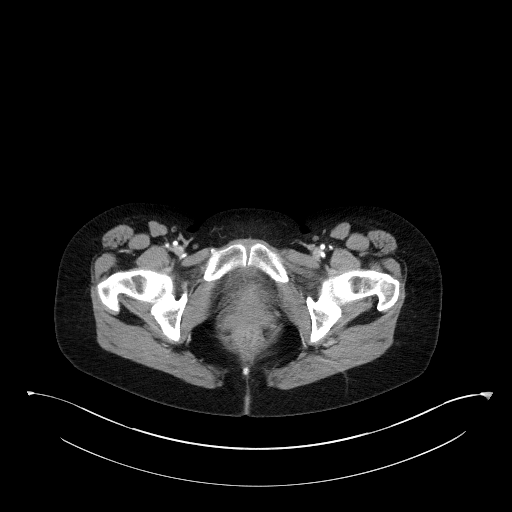
[im 14/124  lung]
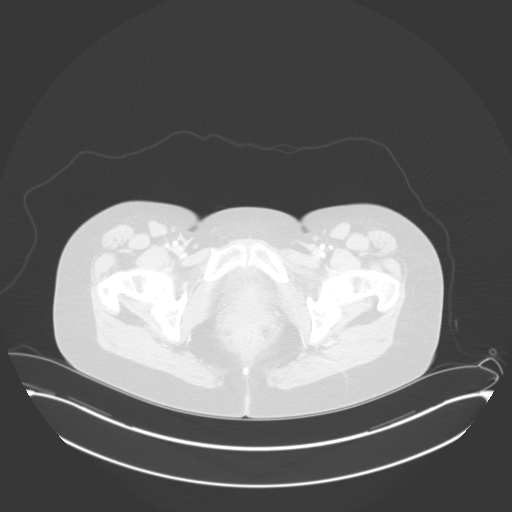
[im 28/124  lung]
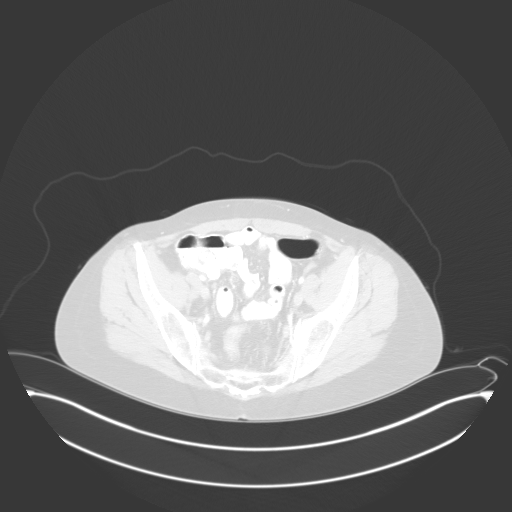
[im 42/124  lung]
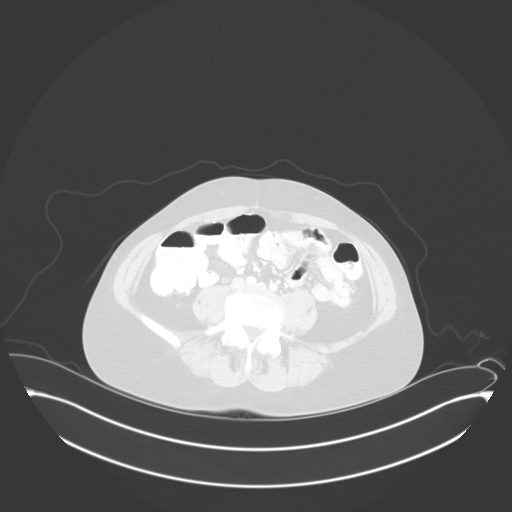
[im 55/124  lung]
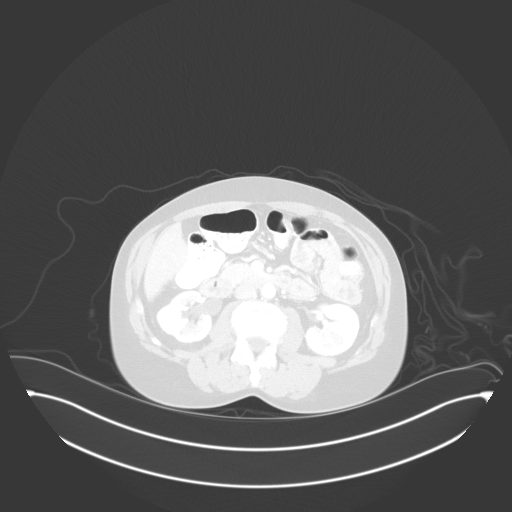
[im 69/124  mediastinal]
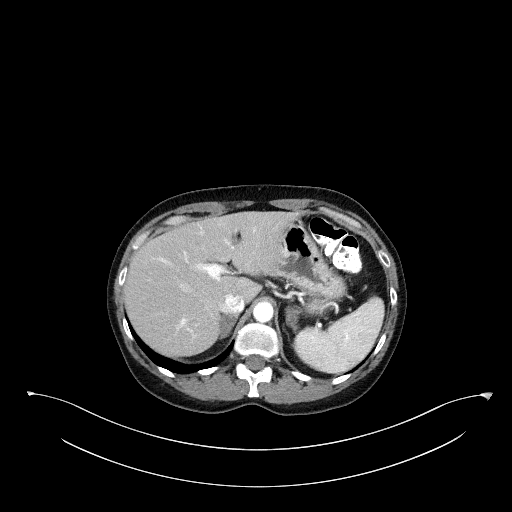
[im 69/124  lung]
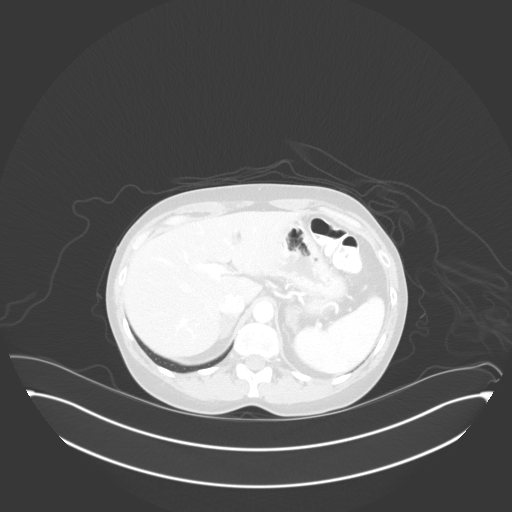
[im 83/124  lung]
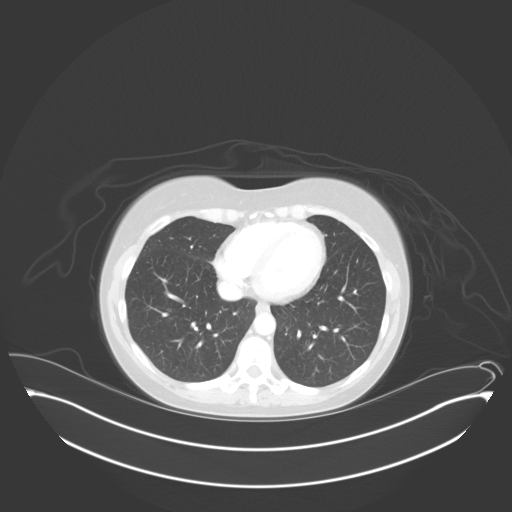
[im 96/124  lung]
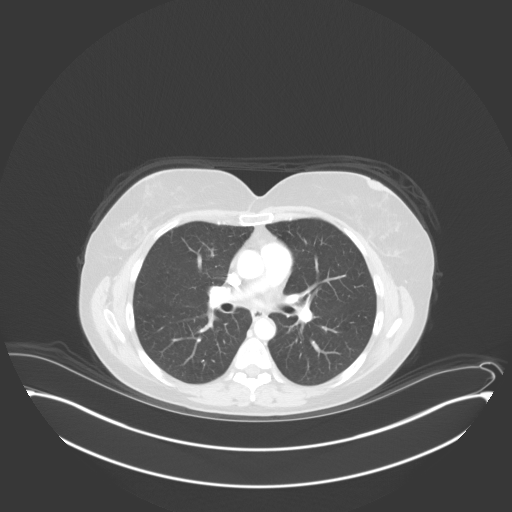
[im 110/124  lung]
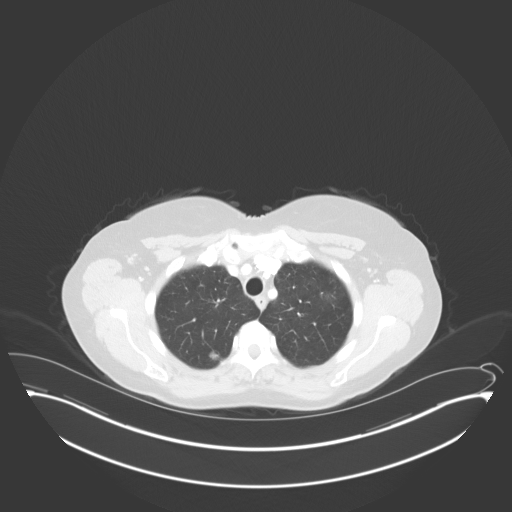

[Series 4: lung · axial · 0.71mm/px · z∈[+1322,+1370]mm · 2 of 169 slices shown]
[im 13/169  lung]
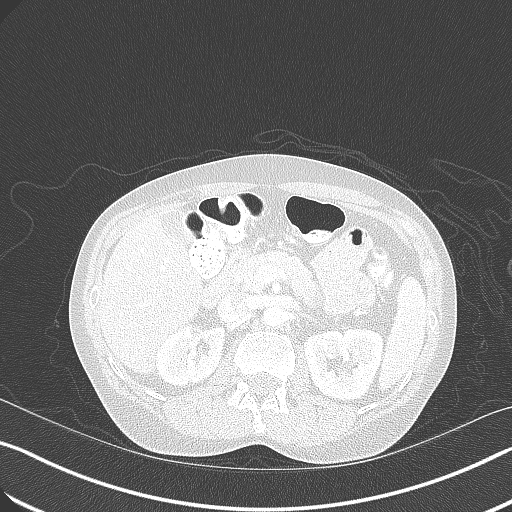
[im 37/169  lung]
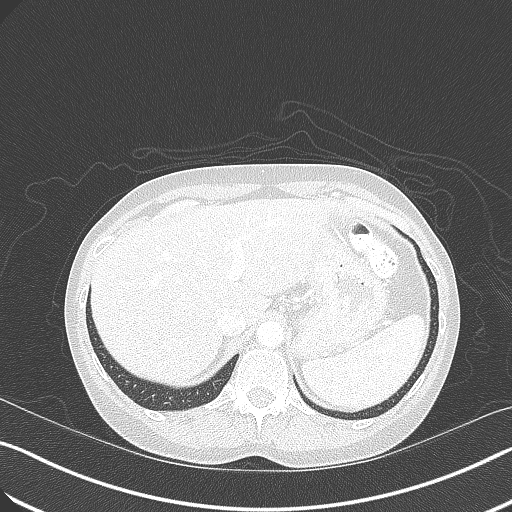

[Series 5: coronals · coronal · 0.76mm/px · 3 of 133 slices shown]
[im 27/133  lung]
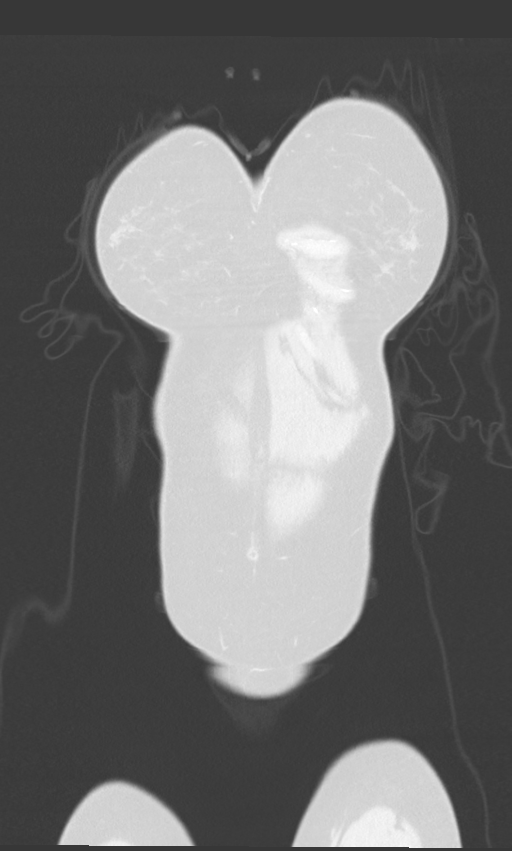
[im 53/133  lung]
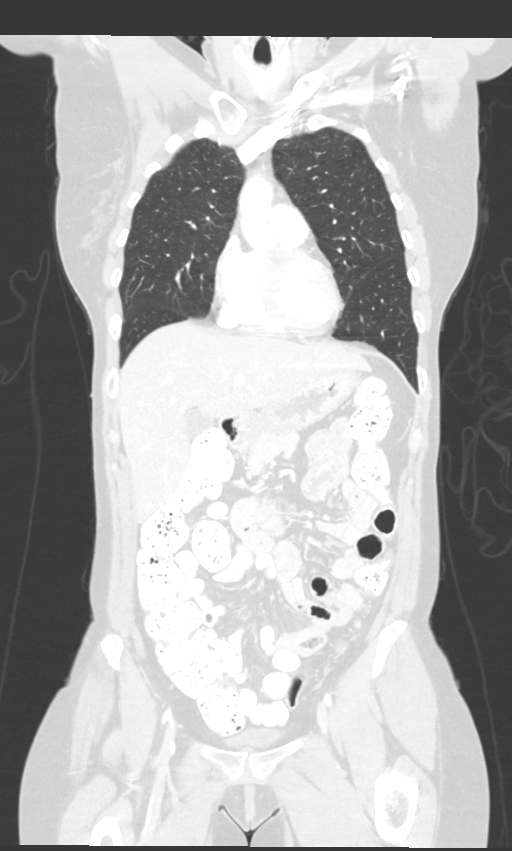
[im 80/133  lung]
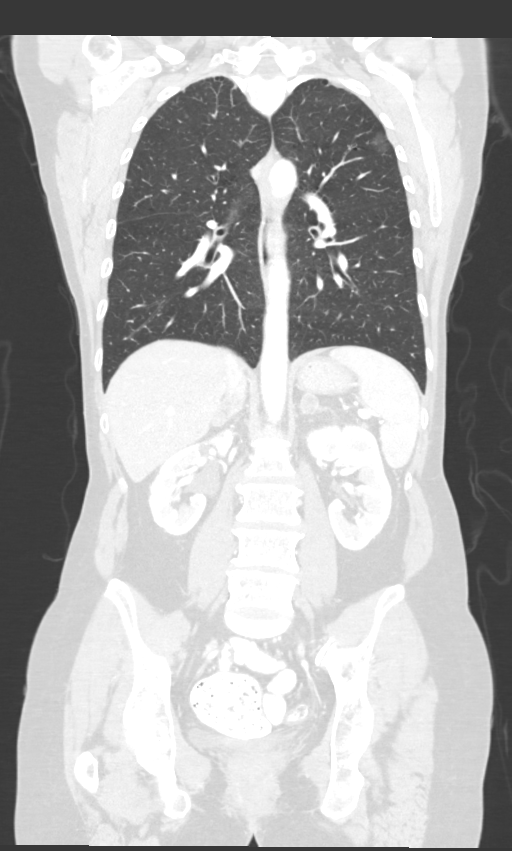

[13 of 36 positions shown; findings below may reference images not displayed]

Imaging Indication: Assess response to therapy

Interval therapy since last imaging? Yes

Initial Cancer Diagnosis

Date: 04/24/2021; Established by: Biopsy-proven

Detailed Pathology: Stage IV non-small cell lung cancer,
adenocarcinoma.

Primary Tumor location:  Right upper and right lower lobe.

Surgeries: Hysterectomy.

Chemotherapy: Yes; Ongoing? Yes; Most recent administration:
12/06/2021

Immunotherapy? No

Radiation therapy? No

EXAM:
CT CHEST, ABDOMEN, AND PELVIS WITH CONTRAST
RADIATION DOSE REDUCTION: This exam was performed according to the
departmental dose-optimization program which includes automated
exposure control, adjustment of the mA and/or kV according to
patient size and/or use of iterative reconstruction technique.

CONTRAST:  100mL OMNIPAQUE IOHEXOL 300 MG/ML  SOLN
FINDINGS: CT CHEST FINDINGS

Cardiovascular: Heart size is normal. There is no significant
pericardial fluid, thickening or pericardial calcification.
Atherosclerotic calcifications in the thoracic aorta. No definite
coronary artery calcifications.

Mediastinum/Nodes: No pathologically enlarged mediastinal or hilar
lymph nodes. Esophagus is unremarkable in appearance. No axillary
lymphadenopathy.

Lungs/Pleura: Again noted are multiple tiny 2-5 mm solid-appearing
pulmonary nodules scattered throughout the lungs bilaterally, which
are stable in size and number. In addition, there are multiple sub
solid lesions in the lungs bilaterally which are grossly similar
compared to the recent prior study. Specific examples include a sub
solid right lower lobe lesion (axial image 61 of series 4) which is
predominantly cystic in appearance and measures 1.5 x 1.1 cm
(unchanged), a 2.1 x 1.3 cm lesion (axial image 40 of series 4) in
the periphery of the left upper lobe (unchanged), and several other
smaller lesions which are also grossly unchanged. No new suspicious
appearing pulmonary nodules or masses are otherwise noted. No acute
consolidative airspace disease. No pleural effusions. Mild diffuse
bronchial wall thickening with very mild centrilobular and
paraseptal emphysema.

Musculoskeletal: There are no aggressive appearing lytic or blastic
lesions noted in the visualized portions of the skeleton.

CT ABDOMEN PELVIS FINDINGS

Hepatobiliary: No suspicious cystic or solid hepatic lesions. No
intra or extrahepatic biliary ductal dilatation. Gallbladder is
normal in appearance.

Pancreas: No pancreatic mass. No pancreatic ductal dilatation. No
pancreatic or peripancreatic fluid collections or inflammatory
changes.

Spleen: Unremarkable.

Adrenals/Urinary Tract: Low-attenuation adrenal nodules bilaterally,
similar to prior examinations, previously not hypermetabolic on
prior PET-CT and characterized as benign adenomas, measuring 1.6 x
2.0 cm on the right and 2.8 x 1.4 cm on the left. Bilateral kidneys
are normal in appearance. No hydroureteronephrosis. Urinary bladder
is normal in appearance.

Stomach/Bowel: The appearance of the stomach is normal. No
pathologic dilatation of small bowel or colon. Normal appendix.

Vascular/Lymphatic: Aortic atherosclerosis, without evidence of
aneurysm or dissection in the abdominal or pelvic vasculature. No
lymphadenopathy noted in the abdomen or pelvis.

Reproductive: Status post hysterectomy. Ovaries are not confidently
identified may be surgically absent or atrophic.

Other: No significant volume of ascites.  No pneumoperitoneum.

Musculoskeletal: There are no aggressive appearing lytic or blastic
lesions noted in the visualized portions of the skeleton.
IMPRESSION: 1. Stable number and size of pulmonary nodules, as above, which
remain highly concerning for multifocal primary bronchogenic
adenocarcinoma.
2. No significant lymphadenopathy or definite signs of metastatic
disease identified in the abdomen or pelvis.
3. Mild diffuse bronchial wall thickening with very mild
centrilobular and paraseptal emphysema; imaging findings suggestive
of underlying COPD.
4. Aortic atherosclerosis.
5. Bilateral adrenal adenomas again noted.
6. Additional incidental findings, similar to prior studies, as
above.

## 2024-01-08 ENCOUNTER — Inpatient Hospital Stay: Payer: Commercial Managed Care - PPO

## 2024-01-08 ENCOUNTER — Inpatient Hospital Stay: Payer: Commercial Managed Care - PPO | Admitting: Internal Medicine

## 2024-01-08 VITALS — BP 123/69 | HR 78 | Temp 97.5°F | Resp 16 | Ht 60.0 in | Wt 101.0 lb

## 2024-01-08 DIAGNOSIS — C3491 Malignant neoplasm of unspecified part of right bronchus or lung: Secondary | ICD-10-CM

## 2024-01-08 DIAGNOSIS — E876 Hypokalemia: Secondary | ICD-10-CM | POA: Diagnosis not present

## 2024-01-08 DIAGNOSIS — C3490 Malignant neoplasm of unspecified part of unspecified bronchus or lung: Secondary | ICD-10-CM

## 2024-01-08 DIAGNOSIS — Z5112 Encounter for antineoplastic immunotherapy: Secondary | ICD-10-CM | POA: Diagnosis not present

## 2024-01-08 LAB — CBC WITH DIFFERENTIAL (CANCER CENTER ONLY)
Abs Immature Granulocytes: 0.01 10*3/uL (ref 0.00–0.07)
Basophils Absolute: 0 10*3/uL (ref 0.0–0.1)
Basophils Relative: 1 %
Eosinophils Absolute: 0.2 10*3/uL (ref 0.0–0.5)
Eosinophils Relative: 2 %
HCT: 39.5 % (ref 36.0–46.0)
Hemoglobin: 12.6 g/dL (ref 12.0–15.0)
Immature Granulocytes: 0 %
Lymphocytes Relative: 30 %
Lymphs Abs: 2.2 10*3/uL (ref 0.7–4.0)
MCH: 30.2 pg (ref 26.0–34.0)
MCHC: 31.9 g/dL (ref 30.0–36.0)
MCV: 94.7 fL (ref 80.0–100.0)
Monocytes Absolute: 0.7 10*3/uL (ref 0.1–1.0)
Monocytes Relative: 10 %
Neutro Abs: 4.2 10*3/uL (ref 1.7–7.7)
Neutrophils Relative %: 57 %
Platelet Count: 342 10*3/uL (ref 150–400)
RBC: 4.17 MIL/uL (ref 3.87–5.11)
RDW: 17.2 % — ABNORMAL HIGH (ref 11.5–15.5)
WBC Count: 7.3 10*3/uL (ref 4.0–10.5)
nRBC: 0 % (ref 0.0–0.2)

## 2024-01-08 LAB — CMP (CANCER CENTER ONLY)
ALT: 9 U/L (ref 0–44)
AST: 18 U/L (ref 15–41)
Albumin: 3.9 g/dL (ref 3.5–5.0)
Alkaline Phosphatase: 114 U/L (ref 38–126)
Anion gap: 10 (ref 5–15)
BUN: 5 mg/dL — ABNORMAL LOW (ref 6–20)
CO2: 25 mmol/L (ref 22–32)
Calcium: 9.8 mg/dL (ref 8.9–10.3)
Chloride: 105 mmol/L (ref 98–111)
Creatinine: 0.74 mg/dL (ref 0.44–1.00)
GFR, Estimated: >60 mL/min (ref >60)
Glucose, Bld: 106 mg/dL — ABNORMAL HIGH (ref 70–99)
Potassium: 3 mmol/L — ABNORMAL LOW (ref 3.5–5.1)
Sodium: 140 mmol/L (ref 135–145)
Total Bilirubin: 0.3 mg/dL (ref 0.0–1.2)
Total Protein: 7.7 g/dL (ref 6.5–8.1)

## 2024-01-08 LAB — TOTAL PROTEIN, URINE DIPSTICK: Protein, ur: 30 mg/dL — AB

## 2024-01-08 MED ORDER — PROCHLORPERAZINE MALEATE 10 MG PO TABS
10.0000 mg | ORAL_TABLET | Freq: Once | ORAL | Status: AC
Start: 2024-01-08 — End: 2024-01-08
  Administered 2024-01-08: 10 mg via ORAL
  Filled 2024-01-08: qty 1

## 2024-01-08 MED ORDER — POTASSIUM CHLORIDE CRYS ER 20 MEQ PO TBCR: 20.0000 meq | EXTENDED_RELEASE_TABLET | Freq: Every day | ORAL | 0 refills | Status: DC

## 2024-01-08 MED ORDER — SODIUM CHLORIDE 0.9 % IV SOLN
700.0000 mg | Freq: Once | INTRAVENOUS | Status: AC
  Administered 2024-01-08: 700 mg via INTRAVENOUS
  Filled 2024-01-08: qty 12

## 2024-01-08 MED ORDER — SODIUM CHLORIDE 0.9% FLUSH: 10.0000 mL | INTRAVENOUS | Status: DC | PRN

## 2024-01-08 MED ORDER — CYANOCOBALAMIN 1000 MCG/ML IJ SOLN
1000.0000 ug | Freq: Once | INTRAMUSCULAR | Status: AC
Start: 2024-01-08 — End: 2024-01-08
  Administered 2024-01-08: 1000 ug via INTRAMUSCULAR
  Filled 2024-01-08: qty 1

## 2024-01-08 MED ORDER — SODIUM CHLORIDE 0.9 % IV SOLN: Freq: Once | INTRAVENOUS | Status: AC

## 2024-01-08 MED ORDER — PEMETREXED DISODIUM CHEMO INJECTION 500 MG
700.0000 mg | Freq: Once | INTRAVENOUS | Status: AC
  Administered 2024-01-08: 700 mg via INTRAVENOUS
  Filled 2024-01-08: qty 20

## 2024-01-08 MED ORDER — HEPARIN SOD (PORK) LOCK FLUSH 100 UNIT/ML IV SOLN: 500.0000 [IU] | Freq: Once | INTRAVENOUS | Status: DC | PRN

## 2024-01-08 MED ORDER — DEXAMETHASONE SODIUM PHOSPHATE 10 MG/ML IJ SOLN
10.0000 mg | Freq: Once | INTRAMUSCULAR | Status: AC
Start: 2024-01-08 — End: 2024-01-08
  Administered 2024-01-08: 10 mg via INTRAVENOUS
  Filled 2024-01-08: qty 1

## 2024-01-08 NOTE — Progress Notes (Signed)
[facility] [phone]   [phone]  OFFICE PROGRESS NOTE  [name], PA-C [address] [place] [address]  DIAGNOSIS:  Stage IV (T4, N2, M1 a) non-small cell lung cancer, adenocarcinoma presented with multifocal disease involving the right upper lobe, right lower lobe as well as suspicious lower paratracheal lymphadenopathy and groundglass nodules in the left lung diagnosed in May 2022. The patient had molecular studies performed by [facility] [employer] and that showed no detectable mutation but this is likely secondary to low-level circulating free tumor DNA.  PRIOR THERAPY: None.  CURRENT THERAPY: palliative systemic chemotherapy with carboplatin for AUC of 5, Alimta 500 Mg/M2 and Avastin 15 Mg/KG every 3 weeks.  The patient is not a great candidate for immunotherapy because of her history of active systemic lupus erythematosus.  Starting from cycle #7 the patient is on maintenance treatment with Alimta and Avastin every 3 weeks.  She is status post 43 cycles.  INTERVAL HISTORY: MARTHELLA OSORNO 53 y.o. female returns to the clinic today for follow-up visit. Discussed the use of AI scribe software for clinical note transcription with the patient, who gave verbal consent to proceed.  History of Present Illness   The patient is a 53 year old female with stage four non-small cell lung cancer and lupus who presents for follow-up of her cancer treatment.  Diagnosed with stage four non-small cell lung cancer, adenocarcinoma, in May 2022. Due to her lupus, she was not given immune therapy. She was started on carboplatin, Alimta, and Avastin for six cycles, followed by maintenance therapy with Alimta and Avastin from cycle seven onwards. She has completed 43 cycles of treatment so far.  She experiences congestion, which she attributes to the weather, causing her nose to be either clogged or running. She also experiences tightness in her chest under the  rib cage. Occasionally, she experiences nausea. Her eating habits are inconsistent, but her weight has remained stable. No fever or chills.  Her potassium level is low at 3.0.       MEDICAL HISTORY: Past Medical History:  Diagnosis Date   Adenocarcinoma, lung, right (HCC) 05/03/2021   JERRIANNE HARTIN is a 53 y.o. female with a history of lung nodules dating back to 2019 who is referred in consultation with [name], PA-C for assessment and management. She is a former smoker having quit in 2016. 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 She missed her screening CT last year and imaging was ordered last month. CT chest from 02-16-2021 reveals pro   Anxiety    GERD (gastroesophageal reflux disease)    SLE (systemic lupus erythematosus) (HCC) 05/03/2021   SLE (systemic lupus erythematosus) (HCC) 05/03/2021    ALLERGIES:  is allergic to clindamycin/lincomycin and carboplatin.  MEDICATIONS:  Current Outpatient Medications  Medication Sig Dispense Refill   amLODipine (NORVASC) 5 MG tablet Take 5 mg by mouth daily.     B Complex Vitamins (B COMPLEX 50 PO) Take by mouth.     Calcium Carbonate-Vit D-Min (CALCIUM 1200 PO) Take 1 capsule by mouth daily.     cholecalciferol (VITAMIN D3) 25 MCG (1000 UNIT) tablet Take 2,000 Units by mouth daily.     cyclobenzaprine (FLEXERIL) 10 MG tablet Take 10 mg by mouth at bedtime as needed for muscle spasms.     denosumab (PROLIA) 60 MG/ML SOSY injection Inject 60 mg into the skin every 6 (six) months.  dexamethasone (DECADRON) 4 MG tablet 4 mg p.o. twice daily the day before, day of and day after the chemotherapy every 3 weeks. 40 tablet 2   dicyclomine (BENTYL) 10 MG capsule Take 10 mg by mouth every 6 (six) hours as needed.     diphenhydrAMINE (BENADRYL) 25 MG tablet Take 25 mg by mouth daily as needed for allergies.     DULoxetine (CYMBALTA) 30 MG capsule Take 1 capsule (30 mg total) by mouth at bedtime. (Patient not  taking: Reported on 12/18/2023) 30 capsule 3   famotidine (PEPCID) 20 MG tablet Take 20 mg by mouth at bedtime.     fluticasone (FLONASE) 50 MCG/ACT nasal spray Place 2 sprays into both nostrils daily.     folic acid (FOLVITE) 1 MG tablet TAKE 1 TABLET(1 MG) BY MOUTH DAILY 30 tablet 4   gabapentin (NEURONTIN) 300 MG capsule Take 300 mg by mouth 3 (three) times daily. Patient takes once or twice a day.     hydrocortisone 1 % lotion Apply 1 application topically 2 (two) times daily. 118 mL 0   hydroxychloroquine (PLAQUENIL) 200 MG tablet Take 200 mg by mouth 2 (two) times daily.     Multiple Vitamins-Minerals (MULTI ADULT GUMMIES) CHEW Chew 2 tablets by mouth daily.     omeprazole (PRILOSEC) 40 MG capsule Take 40 mg by mouth daily as needed (acid reflux).     oxyCODONE-acetaminophen (PERCOCET/ROXICET) 5-325 MG tablet Take 1 tablet by mouth every 8 (eight) hours as needed for severe pain (pain score 7-10). 30 tablet 0   potassium chloride SA (KLOR-CON M) 20 MEQ tablet Take 1 tablet (20 mEq total) by mouth daily. (Patient not taking: Reported on 12/18/2023) 6 tablet 0   potassium chloride SA (KLOR-CON M) 20 MEQ tablet Take 1 tablet (20 mEq total) by mouth 2 (two) times daily. (Patient not taking: Reported on 12/18/2023) 20 tablet 0   Probiotic Product (PROBIOTIC-10 PO) Take by mouth.     prochlorperazine (COMPAZINE) 10 MG tablet TAKE 1 TABLET(10 MG) BY MOUTH EVERY 6 HOURS AS NEEDED 30 tablet 2   rizatriptan (MAXALT-MLT) 10 MG disintegrating tablet Take 10 mg by mouth 2 (two) times daily as needed.     tetrahydrozoline 0.05 % ophthalmic solution Place 1 drop into both eyes once. Systane Eye Drops once daily both eyes     triamcinolone ointment (KENALOG) 0.5 % Apply topically 3 (three) times daily.     No current facility-administered medications for this visit.    SURGICAL HISTORY:  Past Surgical History:  Procedure Laterality Date   ABDOMINAL HYSTERECTOMY     BRONCHIAL BIOPSY  04/24/2021   Procedure:  BRONCHIAL BIOPSIES;  Surgeon: Leslye Peer, MD;  Location: [facility] ENDOSCOPY;  Service: Pulmonary;;   BRONCHIAL BRUSHINGS  04/24/2021   Procedure: BRONCHIAL BRUSHINGS;  Surgeon: Leslye Peer, MD;  Location: [facility] ENDOSCOPY;  Service: Pulmonary;;   BRONCHIAL NEEDLE ASPIRATION BIOPSY  04/24/2021   Procedure: BRONCHIAL NEEDLE ASPIRATION BIOPSIES;  Surgeon: Leslye Peer, MD;  Location: [facility] ENDOSCOPY;  Service: Pulmonary;;   BRONCHIAL WASHINGS  04/24/2021   Procedure: BRONCHIAL WASHINGS;  Surgeon: Leslye Peer, MD;  Location: [facility] ENDOSCOPY;  Service: Pulmonary;;   CESAREAN SECTION  11/09/1990   DILATION AND CURETTAGE OF UTERUS Bilateral 09/09/1996   FIDUCIAL MARKER PLACEMENT  04/24/2021   Procedure: FIDUCIAL MARKER PLACEMENT;  Surgeon: Leslye Peer, MD;  Location: [facility] ENDOSCOPY;  Service: Pulmonary;;   TONSILLECTOMY  12/10/1977   VIDEO BRONCHOSCOPY WITH ENDOBRONCHIAL NAVIGATION Bilateral 04/24/2021  Procedure: VIDEO BRONCHOSCOPY WITH ENDOBRONCHIAL NAVIGATION;  Surgeon: Leslye Peer, MD;  Location: [facility] ENDOSCOPY;  Service: Pulmonary;  Laterality: Bilateral;    REVIEW OF SYSTEMS:  A comprehensive review of systems was negative except for: Constitutional: positive for fatigue Ears, nose, mouth, throat, and face: positive for nasal congestion Respiratory: positive for cough   PHYSICAL EXAMINATION: General appearance: alert, cooperative, fatigued, and no distress Head: Normocephalic, without obvious abnormality, atraumatic Neck: no adenopathy, no JVD, supple, symmetrical, trachea midline, and thyroid not enlarged, symmetric, no tenderness/mass/nodules Lymph nodes: Cervical, supraclavicular, and axillary nodes normal. Resp: clear to auscultation bilaterally Back: symmetric, no curvature. ROM normal. No CVA tenderness. Cardio: regular rate and rhythm, S1, S2 normal, no murmur, click, rub or gallop GI: soft, non-tender; bowel sounds normal; no masses,  no organomegaly Extremities: extremities  normal, atraumatic, no cyanosis or edema  ECOG PERFORMANCE STATUS: 1 - Symptomatic but completely ambulatory  Blood pressure 123/69, pulse 78, temperature (!) 97.5 F (36.4 C), temperature source Temporal, resp. rate 16, height 5' (1.524 m), weight 101 lb (45.8 kg), SpO2 100%.  LABORATORY DATA: Lab Results  Component Value Date   WBC 7.3 01/08/2024   HGB 12.6 01/08/2024   HCT 39.5 01/08/2024   MCV 94.7 01/08/2024   PLT 342 01/08/2024      Chemistry      Component Value Date/Time   NA 140 01/08/2024 1102   NA 140 03/13/2021 0000   K 3.0 (L) 01/08/2024 1102   CL 105 01/08/2024 1102   CO2 25 01/08/2024 1102   BUN 5 (L) 01/08/2024 1102   BUN 10 03/13/2021 0000   CREATININE 0.74 01/08/2024 1102   GLU 107 03/13/2021 0000      Component Value Date/Time   CALCIUM 9.8 01/08/2024 1102   ALKPHOS 114 01/08/2024 1102   AST 18 01/08/2024 1102   ALT 9 01/08/2024 1102   BILITOT 0.3 01/08/2024 1102       RADIOGRAPHIC STUDIES: No results found.  ASSESSMENT AND PLAN:  This is a very pleasant 53 years old white female recently diagnosed with a stage IV non-small cell lung cancer, adenocarcinoma with no actionable mutation diagnosed in May 2022.  The patient has also history of systemic lupus erythematosus and she is not a candidate for immunotherapy. She is currently undergoing systemic chemotherapy with carboplatin for AUC of 5, Alimta 500 Mg/M2 and Avastin 15 Mg/KG every 3 weeks status post 43 cycles.  Starting from cycle #7 the patient is on maintenance treatment with Alimta and Avastin every 3 weeks.  She has been tolerating her treatment fairly well with no concerning adverse effects.    Stage IV Non-Small Cell Lung Cancer (Adenocarcinoma) Diagnosed in May 2022. Currently on maintenance therapy with Alimta and Avastin, having completed 43 cycles. Reports intermittent chest tightness, occasional nausea, and stable weight. Lab results support treatment continuation. Immune therapy not  initiated due to SLE. Discussed need for chest, abdomen, and pelvis scan to monitor disease progression. - Continue Alimta and Avastin - Order chest, abdomen, and pelvis scan in two weeks - Follow-up in three weeks with scan results  Hypokalemia Potassium level is 3.0 mEq/L. Requires supplementation and dietary adjustments. - Prescribe 20 mEq potassium chloride daily for seven days - Advise increasing dietary potassium intake (e.g., orange juice, bananas)  Systemic Lupus Erythematosus (SLE) SLE precluded immune therapy for lung cancer treatment. - Monitor for lupus-related symptoms and complications.  The patient was advised to call immediately if he has any other concerning symptoms in the  interval. The patient voices understanding of current disease status and treatment options and is in agreement with the current care plan.  All questions were answered. The patient knows to call the clinic with any problems, questions or concerns. We can certainly see the patient much sooner if necessary. The total time spent in the appointment was 20 minutes.  Disclaimer: This note was dictated with voice recognition software. Similar sounding words can inadvertently be transcribed and may not be corrected upon review.

## 2024-01-08 NOTE — Patient Instructions (Signed)
[facility] A DE[facility] OF [facility]  Discharge Instructions: Thank you for choosing [facility] to provide your oncology and hematology care.   If you have a lab appointment with the Cancer Center, please go directly to the Cancer Center and check in at the registration area.   Wear comfortable clothing and clothing appropriate for easy access to any Portacath or PICC line.   We strive to give you quality time with your provider. You may need to reschedule your appointment if you arrive late (15 or more minutes).  Arriving late affects you and other patients whose appointments are after yours.  Also, if you miss three or more appointments without notifying the office, you may be dismissed from the clinic at the provider's discretion.      For prescription refill requests, have your pharmacy contact our office and allow 72 hours for refills to be completed.    Today you received the following chemotherapy and/or immunotherapy agents: Mvasi, Alimta      To help prevent nausea and vomiting after your treatment, we encourage you to take your nausea medication as directed.  BELOW ARE SYMPTOMS THAT SHOULD BE REPORTED IMMEDIATELY: *FEVER GREATER THAN 100.4 F (38 C) OR HIGHER *CHILLS OR SWEATING *NAUSEA AND VOMITING THAT IS NOT CONTROLLED WITH YOUR NAUSEA MEDICATION *UNUSUAL SHORTNESS OF BREATH *UNUSUAL BRUISING OR BLEEDING *URINARY PROBLEMS (pain or burning when urinating, or frequent urination) *BOWEL PROBLEMS (unusual diarrhea, constipation, pain near the anus) TENDERNESS IN MOUTH AND THROAT WITH OR WITHOUT PRESENCE OF ULCERS (sore throat, sores in mouth, or a toothache) UNUSUAL RASH, SWELLING OR PAIN  UNUSUAL VAGINAL DISCHARGE OR ITCHING   Items with * indicate a potential emergency and should be followed up as soon as possible or go to the Emergency Department if any problems should occur.  Please show the CHEMOTHERAPY ALERT CARD or  IMMUNOTHERAPY ALERT CARD at check-in to the Emergency Department and triage nurse.  Should you have questions after your visit or need to cancel or reschedule your appointment, please contact [facility]  Dept: [phone]  and follow the prompts.  Office hours are 8:00 a.m. to 4:30 p.m. Monday - Friday. Please note that voicemails left after 4:00 p.m. may not be returned until the following business day.  We are closed weekends and major holidays. You have access to a nurse at all times for urgent questions. Please call the main number to the clinic Dept: [phone] and follow the prompts.   For any non-urgent questions, you may also contact your provider using MyChart. We now offer e-Visits for anyone 22 and older to request care online for non-urgent symptoms. For details visit [email or web address].   Also download the MyChart app! Go to the app store, search "MyChart", open the app, select [facility], and log in with your MyChart username and password.

## 2024-01-13 ENCOUNTER — Other Ambulatory Visit: Payer: Self-pay | Admitting: Internal Medicine

## 2024-01-13 DIAGNOSIS — E876 Hypokalemia: Secondary | ICD-10-CM

## 2024-01-23 ENCOUNTER — Ambulatory Visit (HOSPITAL_BASED_OUTPATIENT_CLINIC_OR_DEPARTMENT_OTHER)
Admission: RE | Admit: 2024-01-23 | Discharge: 2024-01-23 | Disposition: A | Discharge status: Home / Self Care | Payer: Commercial Managed Care - PPO | Source: Ambulatory Visit | Attending: Internal Medicine | Admitting: Internal Medicine

## 2024-01-23 DIAGNOSIS — C3490 Malignant neoplasm of unspecified part of unspecified bronchus or lung: Secondary | ICD-10-CM | POA: Diagnosis not present

## 2024-01-23 MED ORDER — IOHEXOL 300 MG/ML  SOLN
100.0000 mL | Freq: Once | INTRAMUSCULAR | Status: AC | PRN
Start: 2024-01-23 — End: 2024-01-23
  Administered 2024-01-23: 100 mL via INTRAVENOUS

## 2024-01-29 ENCOUNTER — Inpatient Hospital Stay: Payer: Commercial Managed Care - PPO | Attending: Hematology and Oncology

## 2024-01-29 ENCOUNTER — Inpatient Hospital Stay: Payer: Commercial Managed Care - PPO

## 2024-01-29 ENCOUNTER — Inpatient Hospital Stay: Payer: Commercial Managed Care - PPO | Admitting: Internal Medicine

## 2024-01-29 VITALS — BP 136/85 | HR 82 | Temp 98.0°F | Resp 15 | Ht 60.0 in | Wt 100.2 lb

## 2024-01-29 DIAGNOSIS — C3491 Malignant neoplasm of unspecified part of right bronchus or lung: Secondary | ICD-10-CM

## 2024-01-29 DIAGNOSIS — C3411 Malignant neoplasm of upper lobe, right bronchus or lung: Secondary | ICD-10-CM | POA: Insufficient documentation

## 2024-01-29 DIAGNOSIS — Z5111 Encounter for antineoplastic chemotherapy: Secondary | ICD-10-CM | POA: Insufficient documentation

## 2024-01-29 DIAGNOSIS — Z5112 Encounter for antineoplastic immunotherapy: Secondary | ICD-10-CM | POA: Insufficient documentation

## 2024-01-29 DIAGNOSIS — M329 Systemic lupus erythematosus, unspecified: Secondary | ICD-10-CM | POA: Diagnosis not present

## 2024-01-29 LAB — CMP (CANCER CENTER ONLY)
ALT: 10 U/L (ref 0–44)
AST: 19 U/L (ref 15–41)
Albumin: 4 g/dL (ref 3.5–5.0)
Alkaline Phosphatase: 119 U/L (ref 38–126)
Anion gap: 10 (ref 5–15)
BUN: 10 mg/dL (ref 6–20)
CO2: 27 mmol/L (ref 22–32)
Calcium: 9.6 mg/dL (ref 8.9–10.3)
Chloride: 101 mmol/L (ref 98–111)
Creatinine: 0.88 mg/dL (ref 0.44–1.00)
GFR, Estimated: >60 mL/min (ref >60)
Glucose, Bld: 116 mg/dL — ABNORMAL HIGH (ref 70–99)
Potassium: 3.2 mmol/L — ABNORMAL LOW (ref 3.5–5.1)
Sodium: 138 mmol/L (ref 135–145)
Total Bilirubin: 0.3 mg/dL (ref 0.0–1.2)
Total Protein: 7.5 g/dL (ref 6.5–8.1)

## 2024-01-29 LAB — CBC WITH DIFFERENTIAL (CANCER CENTER ONLY)
Abs Immature Granulocytes: 0.03 10*3/uL (ref 0.00–0.07)
Basophils Absolute: 0.1 10*3/uL (ref 0.0–0.1)
Basophils Relative: 1 %
Eosinophils Absolute: 0.1 10*3/uL (ref 0.0–0.5)
Eosinophils Relative: 1 %
HCT: 37.5 % (ref 36.0–46.0)
Hemoglobin: 12 g/dL (ref 12.0–15.0)
Immature Granulocytes: 0 %
Lymphocytes Relative: 25 %
Lymphs Abs: 2.1 10*3/uL (ref 0.7–4.0)
MCH: 30.2 pg (ref 26.0–34.0)
MCHC: 32 g/dL (ref 30.0–36.0)
MCV: 94.5 fL (ref 80.0–100.0)
Monocytes Absolute: 1 10*3/uL (ref 0.1–1.0)
Monocytes Relative: 11 %
Neutro Abs: 5.2 10*3/uL (ref 1.7–7.7)
Neutrophils Relative %: 62 %
Platelet Count: 318 10*3/uL (ref 150–400)
RBC: 3.97 MIL/uL (ref 3.87–5.11)
RDW: 16.9 % — ABNORMAL HIGH (ref 11.5–15.5)
WBC Count: 8.5 10*3/uL (ref 4.0–10.5)
nRBC: 0 % (ref 0.0–0.2)

## 2024-01-29 LAB — TOTAL PROTEIN, URINE DIPSTICK: Protein, ur: 30 mg/dL — AB

## 2024-01-29 MED ORDER — PROCHLORPERAZINE MALEATE 10 MG PO TABS
10.0000 mg | ORAL_TABLET | Freq: Once | ORAL | Status: AC
  Administered 2024-01-29: 10 mg via ORAL
  Filled 2024-01-29: qty 1

## 2024-01-29 MED ORDER — DEXAMETHASONE SODIUM PHOSPHATE 10 MG/ML IJ SOLN
10.0000 mg | Freq: Once | INTRAMUSCULAR | Status: AC
  Administered 2024-01-29: 10 mg via INTRAVENOUS
  Filled 2024-01-29: qty 1

## 2024-01-29 MED ORDER — PEMETREXED DISODIUM CHEMO INJECTION 500 MG
700.0000 mg | Freq: Once | INTRAVENOUS | Status: AC
  Administered 2024-01-29: 700 mg via INTRAVENOUS
  Filled 2024-01-29: qty 20

## 2024-01-29 MED ORDER — SODIUM CHLORIDE 0.9 % IV SOLN: Freq: Once | INTRAVENOUS | Status: AC

## 2024-01-29 MED ORDER — SODIUM CHLORIDE 0.9 % IV SOLN
700.0000 mg | Freq: Once | INTRAVENOUS | Status: AC
  Administered 2024-01-29: 700 mg via INTRAVENOUS
  Filled 2024-01-29: qty 12

## 2024-01-29 NOTE — Patient Instructions (Signed)
 [facility]  Discharge Instructions: Thank you for choosing [facility] to provide your oncology and hematology care.   If you have a lab appointment with the Cancer Center, please go directly to the Cancer Center and check in at the registration area.   Wear comfortable clothing and clothing appropriate for easy access to any Portacath or PICC line.   We strive to give you quality time with your provider. You may need to reschedule your appointment if you arrive late (15 or more minutes).  Arriving late affects you and other patients whose appointments are after yours.  Also, if you miss three or more appointments without notifying the office, you may be dismissed from the clinic at the provider's discretion.      For prescription refill requests, have your pharmacy contact our office and allow 72 hours for refills to be completed.    Today you received the following chemotherapy and/or immunotherapy agents: Mvasi, Alimta      To help prevent nausea and vomiting after your treatment, we encourage you to take your nausea medication as directed.  BELOW ARE SYMPTOMS THAT SHOULD BE REPORTED IMMEDIATELY: *FEVER GREATER THAN 100.4 F (38 C) OR HIGHER *CHILLS OR SWEATING *NAUSEA AND VOMITING THAT IS NOT CONTROLLED WITH YOUR NAUSEA MEDICATION *UNUSUAL SHORTNESS OF BREATH *UNUSUAL BRUISING OR BLEEDING *URINARY PROBLEMS (pain or burning when urinating, or frequent urination) *BOWEL PROBLEMS (unusual diarrhea, constipation, pain near the anus) TENDERNESS IN MOUTH AND THROAT WITH OR WITHOUT PRESENCE OF ULCERS (sore throat, sores in mouth, or a toothache) UNUSUAL RASH, SWELLING OR PAIN  UNUSUAL VAGINAL DISCHARGE OR ITCHING   Items with * indicate a potential emergency and should be followed up as soon as possible or go to the Emergency Department if any problems should occur.  Please show the CHEMOTHERAPY ALERT CARD or  IMMUNOTHERAPY ALERT CARD at check-in to the Emergency Department and triage nurse.  Should you have questions after your visit or need to cancel or reschedule your appointment, please contact [facility]  Dept: [phone]  and follow the prompts.  Office hours are 8:00 a.m. to 4:30 p.m. Monday - Friday. Please note that voicemails left after 4:00 p.m. may not be returned until the following business day.  We are closed weekends and major holidays. You have access to a nurse at all times for urgent questions. Please call the main number to the clinic Dept: [phone] and follow the prompts.   For any non-urgent questions, you may also contact your provider using MyChart. We now offer e-Visits for anyone 22 and older to request care online for non-urgent symptoms. For details visit [email or web address].   Also download the MyChart app! Go to the app store, search "MyChart", open the app, select [facility], and log in with your MyChart username and password.

## 2024-01-29 NOTE — Progress Notes (Signed)
[facility] [phone]   [phone]  OFFICE PROGRESS NOTE  [name], PA-C [address] [place] [address]  DIAGNOSIS:  Stage IV (T4, N2, M1 a) non-small cell lung cancer, adenocarcinoma presented with multifocal disease involving the right upper lobe, right lower lobe as well as suspicious lower paratracheal lymphadenopathy and groundglass nodules in the left lung diagnosed in May 2022. The patient had molecular studies performed by [facility] [employer] and that showed no detectable mutation but this is likely secondary to low-level circulating free tumor DNA.  PRIOR THERAPY: None.  CURRENT THERAPY: palliative systemic chemotherapy with carboplatin for AUC of 5, Alimta 500 Mg/M2 and Avastin 15 Mg/KG every 3 weeks.  The patient is not a great candidate for immunotherapy because of her history of active systemic lupus erythematosus.  Starting from cycle #7 the patient is on maintenance treatment with Alimta and Avastin every 3 weeks.  She is status post 44 cycles.  INTERVAL HISTORY: Kirsten Williams 53 y.o. female returns to the clinic today for follow-up visit. Discussed the use of AI scribe software for clinical note transcription with the patient, who gave verbal consent to proceed.  History of Present Illness   Kirsten Williams is a 53 year old female with stage four non-small cell lung cancer adenocarcinoma who presents for follow-up and treatment.  Diagnosed with stage four non-small cell lung cancer adenocarcinoma in May 2022, she has been on a treatment regimen of carboplatin, Alimta, and Avastin due to her history of lupus, which precludes the use of immunotherapy. Initially, she received six cycles of this combination and has since been on Alimta and Avastin alone, completing 44 cycles to date. She is here for her 45th cycle of treatment and feels 'pretty good' overall. Her recent scan was performed last Thursday, and she is hopeful about the  results.  She has been cautious about her diet, particularly avoiding dairy products, which have previously caused gastrointestinal symptoms severe enough to induce vomiting. She uses Gas-X before consuming any dairy, which has helped manage her symptoms.  She experienced a fall last Friday while walking her dog, resulting in minor bruises but no significant injuries. She attributes the fall to slipping on frozen leaves.  She is looking forward to the birth of her fifth grandchild in July and wants to remain healthy to see her grandchildren grow up.       MEDICAL HISTORY: Past Medical History:  Diagnosis Date   Adenocarcinoma, lung, right (HCC) 05/03/2021   Kirsten Williams is a 53 y.o. female with a history of lung nodules dating back to 2019 who is referred in consultation with [name], PA-C for assessment and management. She is a former smoker having quit in 2016. 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 She missed her screening CT last year and imaging was ordered last month. CT chest from 02-16-2021 reveals pro   Anxiety    GERD (gastroesophageal reflux disease)    SLE (systemic lupus erythematosus) (HCC) 05/03/2021   SLE (systemic lupus erythematosus) (HCC) 05/03/2021    ALLERGIES:  is allergic to clindamycin/lincomycin and carboplatin.  MEDICATIONS:  Current Outpatient Medications  Medication Sig Dispense Refill   amLODipine (NORVASC) 5 MG tablet Take 5 mg by mouth daily.     B Complex Vitamins (B COMPLEX 50 PO) Take by mouth.     Calcium Carbonate-Vit D-Min (CALCIUM 1200 PO) Take 1 capsule by mouth daily.  cholecalciferol (VITAMIN D3) 25 MCG (1000 UNIT) tablet Take 2,000 Units by mouth daily.     cyclobenzaprine (FLEXERIL) 10 MG tablet Take 10 mg by mouth at bedtime as needed for muscle spasms.     denosumab (PROLIA) 60 MG/ML SOSY injection Inject 60 mg into the skin every 6 (six) months.     dexamethasone (DECADRON) 4 MG tablet 4 mg p.o.  twice daily the day before, day of and day after the chemotherapy every 3 weeks. 40 tablet 2   dicyclomine (BENTYL) 10 MG capsule Take 10 mg by mouth every 6 (six) hours as needed.     diphenhydrAMINE (BENADRYL) 25 MG tablet Take 25 mg by mouth daily as needed for allergies.     DULoxetine (CYMBALTA) 30 MG capsule Take 1 capsule (30 mg total) by mouth at bedtime. (Patient not taking: Reported on 12/18/2023) 30 capsule 3   famotidine (PEPCID) 20 MG tablet Take 20 mg by mouth at bedtime.     fluticasone (FLONASE) 50 MCG/ACT nasal spray Place 2 sprays into both nostrils daily.     folic acid (FOLVITE) 1 MG tablet TAKE 1 TABLET(1 MG) BY MOUTH DAILY 30 tablet 4   gabapentin (NEURONTIN) 300 MG capsule Take 300 mg by mouth 3 (three) times daily. Patient takes once or twice a day.     hydrocortisone 1 % lotion Apply 1 application topically 2 (two) times daily. 118 mL 0   hydroxychloroquine (PLAQUENIL) 200 MG tablet Take 200 mg by mouth 2 (two) times daily.     Multiple Vitamins-Minerals (MULTI ADULT GUMMIES) CHEW Chew 2 tablets by mouth daily.     omeprazole (PRILOSEC) 40 MG capsule Take 40 mg by mouth daily as needed (acid reflux).     oxyCODONE-acetaminophen (PERCOCET/ROXICET) 5-325 MG tablet Take 1 tablet by mouth every 8 (eight) hours as needed for severe pain (pain score 7-10). 30 tablet 0   potassium chloride SA (KLOR-CON M) 20 MEQ tablet Take 1 tablet (20 mEq total) by mouth daily. 7 tablet 0   Probiotic Product (PROBIOTIC-10 PO) Take by mouth.     prochlorperazine (COMPAZINE) 10 MG tablet TAKE 1 TABLET(10 MG) BY MOUTH EVERY 6 HOURS AS NEEDED 30 tablet 2   rizatriptan (MAXALT-MLT) 10 MG disintegrating tablet Take 10 mg by mouth 2 (two) times daily as needed.     tetrahydrozoline 0.05 % ophthalmic solution Place 1 drop into both eyes once. Systane Eye Drops once daily both eyes     triamcinolone ointment (KENALOG) 0.5 % Apply topically 3 (three) times daily.     No current facility-administered  medications for this visit.    SURGICAL HISTORY:  Past Surgical History:  Procedure Laterality Date   ABDOMINAL HYSTERECTOMY     BRONCHIAL BIOPSY  04/24/2021   Procedure: BRONCHIAL BIOPSIES;  Surgeon: Leslye Peer, MD;  Location: [facility] ENDOSCOPY;  Service: Pulmonary;;   BRONCHIAL BRUSHINGS  04/24/2021   Procedure: BRONCHIAL BRUSHINGS;  Surgeon: Leslye Peer, MD;  Location: [facility] ENDOSCOPY;  Service: Pulmonary;;   BRONCHIAL NEEDLE ASPIRATION BIOPSY  04/24/2021   Procedure: BRONCHIAL NEEDLE ASPIRATION BIOPSIES;  Surgeon: Leslye Peer, MD;  Location: [facility] ENDOSCOPY;  Service: Pulmonary;;   BRONCHIAL WASHINGS  04/24/2021   Procedure: BRONCHIAL WASHINGS;  Surgeon: Leslye Peer, MD;  Location: [facility] ENDOSCOPY;  Service: Pulmonary;;   CESAREAN SECTION  11/09/1990   DILATION AND CURETTAGE OF UTERUS Bilateral 09/09/1996   FIDUCIAL MARKER PLACEMENT  04/24/2021   Procedure: FIDUCIAL MARKER PLACEMENT;  Surgeon: Leslye Peer, MD;  Location: [facility] ENDOSCOPY;  Service: Pulmonary;;   TONSILLECTOMY  12/10/1977   VIDEO BRONCHOSCOPY WITH ENDOBRONCHIAL NAVIGATION Bilateral 04/24/2021   Procedure: VIDEO BRONCHOSCOPY WITH ENDOBRONCHIAL NAVIGATION;  Surgeon: Leslye Peer, MD;  Location: [facility] ENDOSCOPY;  Service: Pulmonary;  Laterality: Bilateral;    REVIEW OF SYSTEMS:  Constitutional: positive for fatigue Eyes: negative Ears, nose, mouth, throat, and face: negative Respiratory: negative Cardiovascular: negative Gastrointestinal: positive for change in bowel habits Genitourinary:negative Integument/breast: negative Hematologic/lymphatic: negative Musculoskeletal:negative Neurological: negative Behavioral/Psych: negative Endocrine: negative Allergic/Immunologic: negative   PHYSICAL EXAMINATION: General appearance: alert, cooperative, fatigued, and no distress Head: Normocephalic, without obvious abnormality, atraumatic Neck: no adenopathy, no JVD, supple, symmetrical, trachea midline, and thyroid not  enlarged, symmetric, no tenderness/mass/nodules Lymph nodes: Cervical, supraclavicular, and axillary nodes normal. Resp: clear to auscultation bilaterally Back: symmetric, no curvature. ROM normal. No CVA tenderness. Cardio: regular rate and rhythm, S1, S2 normal, no murmur, click, rub or gallop GI: soft, non-tender; bowel sounds normal; no masses,  no organomegaly Extremities: extremities normal, atraumatic, no cyanosis or edema Neurologic: Alert and oriented X 3, normal strength and tone. Normal symmetric reflexes. Normal coordination and gait  ECOG PERFORMANCE STATUS: 1 - Symptomatic but completely ambulatory  Blood pressure 136/85, pulse 82, temperature 98 F (36.7 C), resp. rate 15, height 5' (1.524 m), weight 100 lb 3.2 oz (45.5 kg), SpO2 100%.  LABORATORY DATA: Lab Results  Component Value Date   WBC 8.5 01/29/2024   HGB 12.0 01/29/2024   HCT 37.5 01/29/2024   MCV 94.5 01/29/2024   PLT 318 01/29/2024      Chemistry      Component Value Date/Time   NA 138 01/29/2024 1004   NA 140 03/13/2021 0000   K 3.2 (L) 01/29/2024 1004   CL 101 01/29/2024 1004   CO2 27 01/29/2024 1004   BUN 10 01/29/2024 1004   BUN 10 03/13/2021 0000   CREATININE 0.88 01/29/2024 1004   GLU 107 03/13/2021 0000      Component Value Date/Time   CALCIUM 9.6 01/29/2024 1004   ALKPHOS 119 01/29/2024 1004   AST 19 01/29/2024 1004   ALT 10 01/29/2024 1004   BILITOT 0.3 01/29/2024 1004       RADIOGRAPHIC STUDIES: CT CHEST ABDOMEN PELVIS W CONTRAST Result Date: 01/28/2024 CLINICAL DATA:  Lung cancer restaging, ongoing chemotherapy * Tracking Code: BO * EXAM: CT CHEST, ABDOMEN, AND PELVIS WITH CONTRAST TECHNIQUE: Multidetector CT imaging of the chest, abdomen and pelvis was performed following the standard protocol during bolus administration of intravenous contrast. RADIATION DOSE REDUCTION: This exam was performed according to the departmental dose-optimization program which includes automated  exposure control, adjustment of the mA and/or kV according to patient size and/or use of iterative reconstruction technique. CONTRAST:  OMNIPAQUE IOHEXOL 300 MG/ML SOLN additional oral enteric contrast COMPARISON:  10/15/2023 FINDINGS: CT CHEST FINDINGS Cardiovascular: No significant vascular findings. Normal heart size. No pericardial effusion. Mediastinum/Nodes: Unchanged enlarged mediastinal lymph nodes, pretracheal nodes measuring up to 1.8 x 1.2 cm (series 301, image 22). Thyroid gland, trachea, and esophagus demonstrate no significant findings. Lungs/Pleura: Unchanged, dominant subsolid nodule of the anterior superior segment right lower lobe, measuring 2.3 x 1.5 cm (series 302, image 51). Additional unchanged smaller subsolid and ground-glass opacities in the posterior right upper lobe (series 302, image 27) and in the peripheral left upper lobe (series 302, image 30). Unchanged 0.3 cm solid nodule of the anterior inferior right upper lobe (series 302, image 39). Multiple metallic biopsy marking clips and fiducials.  No pleural effusion or pneumothorax. Musculoskeletal: No chest wall abnormality. No acute osseous findings. CT ABDOMEN PELVIS FINDINGS Hepatobiliary: No solid liver abnormality is seen. Hepatic steatosis. No gallstones, gallbladder wall thickening, or biliary dilatation. Pancreas: Unremarkable. No pancreatic ductal dilatation or surrounding inflammatory changes. Spleen: Normal in size without significant abnormality. Adrenals/Urinary Tract: Unchanged benign bilateral adrenal adenomata, requiring no specific further follow-up or characterization. Unchanged patulous right renal pelvis without overt hydronephrosis. Kidneys are otherwise normal, without renal calculi, solid lesion, or hydronephrosis. Bladder is unremarkable. Stomach/Bowel: Stomach is within normal limits. Appendix not clearly visualized. No evidence of bowel wall thickening, distention, or inflammatory changes.  Vascular/Lymphatic: Severe aortic atherosclerosis. No enlarged abdominal or pelvic lymph nodes. Reproductive: Status post. Other: No abdominal wall hernia or abnormality. No ascites. Musculoskeletal: No acute osseous findings. IMPRESSION: 1. Unchanged, dominant subsolid nodule of the anterior superior segment right lower lobe, measuring 2.3 x 1.5 cm. Additional unchanged smaller subsolid subsolid, ground-glass, and solid nodules, unchanged. No new nodules. 2. Unchanged enlarged mediastinal lymph nodes. 3. No evidence of lymphadenopathy or metastatic disease in the abdomen or pelvis. 4. Hepatic steatosis. Aortic Atherosclerosis (ICD10-I70.0). Electronically Signed   By: Jearld Lesch M.D.   On: 01/28/2024 16:32    ASSESSMENT AND PLAN:  This is a very pleasant 53 years old white female recently diagnosed with a stage IV non-small cell lung cancer, adenocarcinoma with no actionable mutation diagnosed in May 2022.  The patient has also history of systemic lupus erythematosus and she is not a candidate for immunotherapy. She is currently undergoing systemic chemotherapy with carboplatin for AUC of 5, Alimta 500 Mg/M2 and Avastin 15 Mg/KG every 3 weeks status post 44 cycles.  Starting from cycle #7 the patient is on maintenance treatment with Alimta and Avastin every 3 weeks.  She has been tolerating her treatment fairly well with no concerning adverse effects.    Stage IV Non-Small Cell Lung Cancer (Adenocarcinoma) Kathe Wirick, a 53 year old female, diagnosed in May 2022, has been on carboplatin, Alimta, and Avastin due to lupus, precluding immunotherapy. After six cycles of the three-drug regimen, she has continued with Alimta and Avastin for 44 cycles. Latest scan shows no lymphadenopathy or metastatic spread in the abdomen. She reports feeling well, with minor bruises from a recent fall. Risks and benefits of continuing the current regimen, including stable disease and avoidance of immunotherapy, were  discussed. - Proceed with cycle 45 of Alimta and Avastin today - Schedule follow-up appointment in three weeks  Lupus Lupus has influenced her cancer treatment by excluding immunotherapy. She is managing her condition without significant issues.  Gastrointestinal Discomfort Experiencing gastrointestinal discomfort, particularly with dairy, leading to vomiting. Managing by avoiding dairy and using Gas-X before meals, which has been effective.  General Health Maintenance Advised to be cautious due to icy conditions to prevent falls. Motivated to maintain health to see her fifth grandchild, expected in July. - Advise caution due to icy conditions to prevent falls.   The patient was advised to call immediately if she has any concerning symptoms in the interval.   The patient voices understanding of current disease status and treatment options and is in agreement with the current care plan.  All questions were answered. The patient knows to call the clinic with any problems, questions or concerns. We can certainly see the patient much sooner if necessary. The total time spent in the appointment was 30 minutes.  Disclaimer: This note was dictated with voice recognition software. Similar sounding words can inadvertently be  transcribed and may not be corrected upon review.

## 2024-02-01 NOTE — Progress Notes (Signed)
 Updated pemetrexed ERX to [id number]   Pryor Ochoa, PharmD 02/01/24

## 2024-02-13 NOTE — Progress Notes (Signed)
 [facility] OFFICE PROGRESS NOTE  Kirsten Peak, PA-C [address] [place] [address]  DIAGNOSIS: Stage IV (T4, N2, M1 a) non-small cell lung cancer, adenocarcinoma presented with multifocal disease involving the right upper lobe, right lower lobe as well as suspicious lower paratracheal lymphadenopathy and groundglass nodules in the left lung diagnosed in May 2022. The patient had molecular studies performed by [facility] and that showed no detectable mutation but this is likely secondary to low-level circulating free tumor DNA.   PRIOR THERAPY: None  CURRENT THERAPY: Palliative systemic chemotherapy with carboplatin for AUC of 5, Alimta 500 Mg/M2 and Avastin 15 Mg/KG every 3 weeks.  The patient is not a great candidate for immunotherapy in the event that she has a genetic mutation.  She is status post 45 cycles. Starting from cycle #6, the patient will start maintenance Alimta and Avastin.    INTERVAL HISTORY: Kirsten Williams 53 y.o. female returns to the clinic today for a follow-up visit.  The patient was last seen 3 weeks ago by Dr. Arbutus Ped.   Overall the patient states she is feeling good today.  She tells me she is working with her gastroenterologist regarding persistent bloating and stomach irritation with dairy.  She has tried to cut back on dairy which has been helping somewhat.  They have her on omeprazole and famotidine.  She also has Bentyl and takes a probiotic every day.  She states they are working on ruling things out that may be exacerbating her symptoms.  Sometimes she may have random nausea and vomiting although she states she often feels better after throwing up.  She also tells me she has an appointment to see her dermatologist on Friday for a rash around her neck and upper back.  She tried to test the therapy whether she was allergic to the metal of her necklaces and stopped wearing jewelry for 1 month but continued to have a rash.  She uses steroid  cream.  She also tells me that she is going to see her dentist in 2 weeks for cleaning.  However, she believes she is going to need additional dental extractions and implants.  She will let us know when it is time so we can coordinate her infusion appointments around this considering she is on Avastin.  She has been trying to focus on her eating and did gain about 2 pounds.  She denies any fever, chills, or night sweats.  She denies any changes with her breathing or any worsening shortness of breath or cough.  Denies any hemoptysis.  She does persistently get a bandlike sensation around her diaphragm after infusions which has been going on since she started treatment.  This is unchanged.  She intermittently may have diarrhea or constipation.  Denies any headache or visual changes.  She is here today for evaluation before undergoing her next cycle of treatment with cycle #46.    MEDICAL HISTORY: Past Medical History:  Diagnosis Date   Adenocarcinoma, lung, right (HCC) 05/03/2021   Kirsten Williams is a 53 y.o. female with a history of lung nodules dating back to 2019 who is referred in consultation with Kirsten Peak, PA-C for assessment and management. She is a former smoker having quit in 2016. 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 She missed her screening CT last year and imaging was ordered last month. CT chest from 02-16-2021 reveals pro   Anxiety    GERD (gastroesophageal reflux  disease)    SLE (systemic lupus erythematosus) (HCC) 05/03/2021   SLE (systemic lupus erythematosus) (HCC) 05/03/2021    ALLERGIES:  is allergic to clindamycin/lincomycin and carboplatin.  MEDICATIONS:  Current Outpatient Medications  Medication Sig Dispense Refill   amLODipine (NORVASC) 5 MG tablet Take 5 mg by mouth daily.     B Complex Vitamins (B COMPLEX 50 PO) Take by mouth.     Calcium Carbonate-Vit D-Min (CALCIUM 1200 PO) Take 1 capsule by mouth daily.     cholecalciferol  (VITAMIN D3) 25 MCG (1000 UNIT) tablet Take 2,000 Units by mouth daily.     cyclobenzaprine (FLEXERIL) 10 MG tablet Take 10 mg by mouth at bedtime as needed for muscle spasms.     denosumab (PROLIA) 60 MG/ML SOSY injection Inject 60 mg into the skin every 6 (six) months.     dexamethasone (DECADRON) 4 MG tablet 4 mg p.o. twice daily the day before, day of and day after the chemotherapy every 3 weeks. 40 tablet 2   dicyclomine (BENTYL) 10 MG capsule Take 10 mg by mouth every 6 (six) hours as needed.     diphenhydrAMINE (BENADRYL) 25 MG tablet Take 25 mg by mouth daily as needed for allergies.     DULoxetine (CYMBALTA) 30 MG capsule Take 1 capsule (30 mg total) by mouth at bedtime. (Patient not taking: Reported on 12/18/2023) 30 capsule 3   famotidine (PEPCID) 20 MG tablet Take 20 mg by mouth at bedtime.     fluticasone (FLONASE) 50 MCG/ACT nasal spray Place 2 sprays into both nostrils daily.     folic acid (FOLVITE) 1 MG tablet TAKE 1 TABLET(1 MG) BY MOUTH DAILY 30 tablet 4   gabapentin (NEURONTIN) 300 MG capsule Take 300 mg by mouth 3 (three) times daily. Patient takes once or twice a day.     hydrocortisone 1 % lotion Apply 1 application topically 2 (two) times daily. 118 mL 0   hydroxychloroquine (PLAQUENIL) 200 MG tablet Take 200 mg by mouth 2 (two) times daily.     Multiple Vitamins-Minerals (MULTI ADULT GUMMIES) CHEW Chew 2 tablets by mouth daily.     omeprazole (PRILOSEC) 40 MG capsule Take 40 mg by mouth daily as needed (acid reflux).     oxyCODONE-acetaminophen (PERCOCET/ROXICET) 5-325 MG tablet Take 1 tablet by mouth every 8 (eight) hours as needed for severe pain (pain score 7-10). 30 tablet 0   potassium chloride SA (KLOR-CON M) 20 MEQ tablet Take 1 tablet (20 mEq total) by mouth daily. 7 tablet 0   Probiotic Product (PROBIOTIC-10 PO) Take by mouth.     prochlorperazine (COMPAZINE) 10 MG tablet TAKE 1 TABLET(10 MG) BY MOUTH EVERY 6 HOURS AS NEEDED 30 tablet 2   rizatriptan (MAXALT-MLT) 10  MG disintegrating tablet Take 10 mg by mouth 2 (two) times daily as needed.     tetrahydrozoline 0.05 % ophthalmic solution Place 1 drop into both eyes once. Systane Eye Drops once daily both eyes     triamcinolone ointment (KENALOG) 0.5 % Apply topically 3 (three) times daily.     No current facility-administered medications for this visit.   Facility-Administered Medications Ordered in Other Visits  Medication Dose Route Frequency Provider Last Rate Last Admin   0.9 %  sodium chloride infusion   Intravenous Once Si Gaul, MD       bevacizumab-awwb (MVASI) 800 mg in sodium chloride 0.9 % 100 mL chemo infusion  15 mg/kg (Treatment Plan Recorded) Intravenous Once Si Gaul, MD  dexamethasone (DECADRON) injection 10 mg  10 mg Intravenous Once Si Gaul, MD       heparin lock flush 100 unit/mL  500 Units Intracatheter Once PRN Si Gaul, MD       PEMEtrexed Disodium (ALIMTA) 700 mg in sodium chloride 0.9 % 100 mL chemo infusion  500 mg/m2 (Order-Specific) Intravenous Once Si Gaul, MD       prochlorperazine (COMPAZINE) tablet 10 mg  10 mg Oral Once Si Gaul, MD       sodium chloride flush (NS) 0.9 % injection 10 mL  10 mL Intracatheter PRN Si Gaul, MD        SURGICAL HISTORY:  Past Surgical History:  Procedure Laterality Date   ABDOMINAL HYSTERECTOMY     BRONCHIAL BIOPSY  04/24/2021   Procedure: BRONCHIAL BIOPSIES;  Surgeon: Leslye Peer, MD;  Location: [facility] ENDOSCOPY;  Service: Pulmonary;;   BRONCHIAL BRUSHINGS  04/24/2021   Procedure: BRONCHIAL BRUSHINGS;  Surgeon: Leslye Peer, MD;  Location: [facility] ENDOSCOPY;  Service: Pulmonary;;   BRONCHIAL NEEDLE ASPIRATION BIOPSY  04/24/2021   Procedure: BRONCHIAL NEEDLE ASPIRATION BIOPSIES;  Surgeon: Leslye Peer, MD;  Location: [facility] ENDOSCOPY;  Service: Pulmonary;;   BRONCHIAL WASHINGS  04/24/2021   Procedure: BRONCHIAL WASHINGS;  Surgeon: Leslye Peer, MD;  Location: [facility] ENDOSCOPY;   Service: Pulmonary;;   CESAREAN SECTION  11/09/1990   DILATION AND CURETTAGE OF UTERUS Bilateral 09/09/1996   FIDUCIAL MARKER PLACEMENT  04/24/2021   Procedure: FIDUCIAL MARKER PLACEMENT;  Surgeon: Leslye Peer, MD;  Location: [facility] ENDOSCOPY;  Service: Pulmonary;;   TONSILLECTOMY  12/10/1977   VIDEO BRONCHOSCOPY WITH ENDOBRONCHIAL NAVIGATION Bilateral 04/24/2021   Procedure: VIDEO BRONCHOSCOPY WITH ENDOBRONCHIAL NAVIGATION;  Surgeon: Leslye Peer, MD;  Location: [facility] ENDOSCOPY;  Service: Pulmonary;  Laterality: Bilateral;    REVIEW OF SYSTEMS:   Constitutional: Positive for fatigue 1 week following treatment typically and some decreased appetite a few days following treatment (appetite better at this time). Negative for chills and  fever.  HENT: Negative for mouth sores, nosebleeds, sore throat and trouble swallowing.   Eyes: Negative for eye problems and icterus.  Respiratory: Positive for stable intermittent shortness of breath with certain activities. No changes in baseline cough. Negative for hemoptysis and wheezing.  Cardiovascular: Negative for chest pain (gets chest tightness routinely after treatment but none at this time) and leg swelling.  Gastrointestinal: Positive for constipation and intermittent diarrhea. Positive for intermittent bloating. Positive for intermittent mild nausea/vomiting.  Genitourinary: Negative for bladder incontinence, difficulty urinating, dysuria, frequency and hematuria.   Musculoskeletal: Positive for mild back soreness. Negative for gait problem, neck pain and neck stiffness.  Skin: Improving skin lesion on rigt shoulder. Positive for mild rash on neck and upper back.  Neurological: Negative for dizziness, extremity weakness, gait problem, headaches, light-headedness and seizures.  Hematological: Negative for adenopathy. Does not bruise/bleed easily.  Psychiatric/Behavioral: Negative for confusion, depression and sleep disturbance. The patient is not  nervous/anxious.     PHYSICAL EXAMINATION:  Blood pressure (!) 141/84, pulse 91, temperature 97.6 F (36.4 C), temperature source Temporal, resp. rate 16, height 5' (1.524 m), weight 102 lb 1.6 oz (46.3 kg), SpO2 100%.  ECOG PERFORMANCE STATUS: 1  Physical Exam  Constitutional: Oriented to person, place, and time and thin appearing female, and in no distress.  HENT:  Head: Normocephalic and atraumatic.  Mouth/Throat: Oropharynx is clear and moist. No oropharyngeal exudate.  Eyes: Conjunctivae are normal. Right eye exhibits no discharge. Left eye exhibits no discharge.  No scleral icterus.  Neck: Normal range of motion. Neck supple.  Cardiovascular: Normal rate, regular rhythm, normal heart sounds and intact distal pulses.   Pulmonary/Chest: Effort normal and breath sounds normal. No respiratory distress. No wheezes. No rales.  Abdominal: Soft. Bowel sounds are normal. Exhibits no distension and no mass. There is no tenderness.  Musculoskeletal: Normal range of motion. Exhibits no edema.  Lymphadenopathy:    No cervical adenopathy.  Neurological: Alert and oriented to person, place, and time. Exhibits normal muscle tone. Gait normal. Coordination normal.  Skin: Skin is warm and dry.Positive for rash on upper chest and back.  Psychiatric: Mood, memory and judgment normal.  Vitals reviewed.  LABORATORY DATA: Lab Results  Component Value Date   WBC 8.2 02/19/2024   HGB 11.9 (L) 02/19/2024   HCT 37.8 02/19/2024   MCV 95.0 02/19/2024   PLT 310 02/19/2024      Chemistry      Component Value Date/Time   NA 140 02/19/2024 1042   NA 140 03/13/2021 0000   K 3.3 (L) 02/19/2024 1042   CL 103 02/19/2024 1042   CO2 27 02/19/2024 1042   BUN 6 02/19/2024 1042   BUN 10 03/13/2021 0000   CREATININE 0.86 02/19/2024 1042   GLU 107 03/13/2021 0000      Component Value Date/Time   CALCIUM 9.0 02/19/2024 1042   ALKPHOS 151 (H) 02/19/2024 1042   AST 22 02/19/2024 1042   ALT 16 02/19/2024  1042   BILITOT 0.4 02/19/2024 1042       RADIOGRAPHIC STUDIES:  CT CHEST ABDOMEN PELVIS W CONTRAST Result Date: 01/28/2024 CLINICAL DATA:  Lung cancer restaging, ongoing chemotherapy * Tracking Code: BO * EXAM: CT CHEST, ABDOMEN, AND PELVIS WITH CONTRAST TECHNIQUE: Multidetector CT imaging of the chest, abdomen and pelvis was performed following the standard protocol during bolus administration of intravenous contrast. RADIATION DOSE REDUCTION: This exam was performed according to the departmental dose-optimization program which includes automated exposure control, adjustment of the mA and/or kV according to patient size and/or use of iterative reconstruction technique. CONTRAST:  OMNIPAQUE IOHEXOL 300 MG/ML SOLN additional oral enteric contrast COMPARISON:  10/15/2023 FINDINGS: CT CHEST FINDINGS Cardiovascular: No significant vascular findings. Normal heart size. No pericardial effusion. Mediastinum/Nodes: Unchanged enlarged mediastinal lymph nodes, pretracheal nodes measuring up to 1.8 x 1.2 cm (series 301, image 22). Thyroid gland, trachea, and esophagus demonstrate no significant findings. Lungs/Pleura: Unchanged, dominant subsolid nodule of the anterior superior segment right lower lobe, measuring 2.3 x 1.5 cm (series 302, image 51). Additional unchanged smaller subsolid and ground-glass opacities in the posterior right upper lobe (series 302, image 27) and in the peripheral left upper lobe (series 302, image 30). Unchanged 0.3 cm solid nodule of the anterior inferior right upper lobe (series 302, image 39). Multiple metallic biopsy marking clips and fiducials. No pleural effusion or pneumothorax. Musculoskeletal: No chest wall abnormality. No acute osseous findings. CT ABDOMEN PELVIS FINDINGS Hepatobiliary: No solid liver abnormality is seen. Hepatic steatosis. No gallstones, gallbladder wall thickening, or biliary dilatation. Pancreas: Unremarkable. No pancreatic ductal dilatation or  surrounding inflammatory changes. Spleen: Normal in size without significant abnormality. Adrenals/Urinary Tract: Unchanged benign bilateral adrenal adenomata, requiring no specific further follow-up or characterization. Unchanged patulous right renal pelvis without overt hydronephrosis. Kidneys are otherwise normal, without renal calculi, solid lesion, or hydronephrosis. Bladder is unremarkable. Stomach/Bowel: Stomach is within normal limits. Appendix not clearly visualized. No evidence of bowel wall thickening, distention, or inflammatory changes. Vascular/Lymphatic: Severe aortic atherosclerosis. No  enlarged abdominal or pelvic lymph nodes. Reproductive: Status post. Other: No abdominal wall hernia or abnormality. No ascites. Musculoskeletal: No acute osseous findings. IMPRESSION: 1. Unchanged, dominant subsolid nodule of the anterior superior segment right lower lobe, measuring 2.3 x 1.5 cm. Additional unchanged smaller subsolid subsolid, ground-glass, and solid nodules, unchanged. No new nodules. 2. Unchanged enlarged mediastinal lymph nodes. 3. No evidence of lymphadenopathy or metastatic disease in the abdomen or pelvis. 4. Hepatic steatosis. Aortic Atherosclerosis (ICD10-I70.0). Electronically Signed   By: Jearld Lesch M.D.   On: 01/28/2024 16:32     ASSESSMENT/PLAN:  This is a very pleasant 53 year old Caucasian female diagnosed with stage IV (T4, N2, M1 a) non-small cell lung cancer, adenocarcinoma presented with multifocal disease involving the right upper lobe, right lower lobe as well as suspicious lower paratracheal lymphadenopathy and groundglass nodules in the left lung diagnosed in May 2022. The patient had molecular studies performed by [facility] [employer] and that showed no detectable mutation but this is likely secondary to low-level circulating free tumor DNA. If the patient has disease progression in the future, then we will likely retest her for molecular studies.    The patient is currently  undergoing systemic chemotherapy with carboplatin for an AUC 5, Alimta 500 mg per metered square, and and Avastin 15 mg/kg IV every 3 weeks.  She is status post 45 cycles.  Starting from cycle #7, the patient has been on maintenance treatment with Alimta and Avastin.  Labs were reviewed . Recommend she proceed with cycle 46 today as scheduled. She is ok to treat with urine protein of 30.   We will see her back for labs and follow up in 3 weeks for evaluation before undergoing cycle #47.   She will let us know when she needs to have dental extraction performed as we likely will need to hold her treatment.   She will see dermatology on Friday regarding the rash.  He will continue with her hydrocortisone cream.  The patient was advised to call immediately if she has any concerning symptoms in the interval. The patient voices understanding of current disease status and treatment options and is in agreement with the current care plan. All questions were answered. The patient knows to call the clinic with any problems, questions or concerns. We can certainly see the patient much sooner if necessary   No orders of the defined types were placed in this encounter.     The total time spent in the appointment was 20-29 minutes  Dewaine Morocho L Stefany Starace, PA-C 02/19/24

## 2024-02-19 ENCOUNTER — Inpatient Hospital Stay: Payer: Commercial Managed Care - PPO | Attending: Hematology and Oncology

## 2024-02-19 ENCOUNTER — Inpatient Hospital Stay: Payer: Commercial Managed Care - PPO | Admitting: Physician Assistant

## 2024-02-19 ENCOUNTER — Inpatient Hospital Stay: Payer: Commercial Managed Care - PPO

## 2024-02-19 VITALS — BP 141/84 | HR 91 | Temp 97.6°F | Resp 16 | Ht 60.0 in | Wt 102.1 lb

## 2024-02-19 DIAGNOSIS — C3491 Malignant neoplasm of unspecified part of right bronchus or lung: Secondary | ICD-10-CM

## 2024-02-19 DIAGNOSIS — Z5112 Encounter for antineoplastic immunotherapy: Secondary | ICD-10-CM | POA: Diagnosis present

## 2024-02-19 DIAGNOSIS — Z5111 Encounter for antineoplastic chemotherapy: Secondary | ICD-10-CM

## 2024-02-19 DIAGNOSIS — C3411 Malignant neoplasm of upper lobe, right bronchus or lung: Secondary | ICD-10-CM | POA: Diagnosis present

## 2024-02-19 LAB — CBC WITH DIFFERENTIAL (CANCER CENTER ONLY)
Abs Immature Granulocytes: 0.03 10*3/uL (ref 0.00–0.07)
Basophils Absolute: 0.1 10*3/uL (ref 0.0–0.1)
Basophils Relative: 1 %
Eosinophils Absolute: 0.1 10*3/uL (ref 0.0–0.5)
Eosinophils Relative: 1 %
HCT: 37.8 % (ref 36.0–46.0)
Hemoglobin: 11.9 g/dL — ABNORMAL LOW (ref 12.0–15.0)
Immature Granulocytes: 0 %
Lymphocytes Relative: 25 %
Lymphs Abs: 2 10*3/uL (ref 0.7–4.0)
MCH: 29.9 pg (ref 26.0–34.0)
MCHC: 31.5 g/dL (ref 30.0–36.0)
MCV: 95 fL (ref 80.0–100.0)
Monocytes Absolute: 0.8 10*3/uL (ref 0.1–1.0)
Monocytes Relative: 10 %
Neutro Abs: 5.2 10*3/uL (ref 1.7–7.7)
Neutrophils Relative %: 63 %
Platelet Count: 310 10*3/uL (ref 150–400)
RBC: 3.98 MIL/uL (ref 3.87–5.11)
RDW: 17.2 % — ABNORMAL HIGH (ref 11.5–15.5)
WBC Count: 8.2 10*3/uL (ref 4.0–10.5)
nRBC: 0 % (ref 0.0–0.2)

## 2024-02-19 LAB — CMP (CANCER CENTER ONLY)
ALT: 16 U/L (ref 0–44)
AST: 22 U/L (ref 15–41)
Albumin: 3.6 g/dL (ref 3.5–5.0)
Alkaline Phosphatase: 151 U/L — ABNORMAL HIGH (ref 38–126)
Anion gap: 10 (ref 5–15)
BUN: 6 mg/dL (ref 6–20)
CO2: 27 mmol/L (ref 22–32)
Calcium: 9 mg/dL (ref 8.9–10.3)
Chloride: 103 mmol/L (ref 98–111)
Creatinine: 0.86 mg/dL (ref 0.44–1.00)
GFR, Estimated: >60 mL/min (ref >60)
Glucose, Bld: 181 mg/dL — ABNORMAL HIGH (ref 70–99)
Potassium: 3.3 mmol/L — ABNORMAL LOW (ref 3.5–5.1)
Sodium: 140 mmol/L (ref 135–145)
Total Bilirubin: 0.4 mg/dL (ref 0.0–1.2)
Total Protein: 7 g/dL (ref 6.5–8.1)

## 2024-02-19 LAB — TOTAL PROTEIN, URINE DIPSTICK: Protein, ur: 30 mg/dL — AB

## 2024-02-19 MED ORDER — BEVACIZUMAB-AWWB CHEMO INJECTION 400 MG/16ML
700.0000 mg | Freq: Once | INTRAVENOUS | Status: AC
  Administered 2024-02-19: 700 mg via INTRAVENOUS
  Filled 2024-02-19: qty 12

## 2024-02-19 MED ORDER — SODIUM CHLORIDE 0.9 % IV SOLN
500.0000 mg/m2 | Freq: Once | INTRAVENOUS | Status: AC
  Administered 2024-02-19: 700 mg via INTRAVENOUS
  Filled 2024-02-19: qty 20

## 2024-02-19 MED ORDER — SODIUM CHLORIDE 0.9 % IV SOLN: Freq: Once | INTRAVENOUS | Status: AC

## 2024-02-19 MED ORDER — DEXAMETHASONE SODIUM PHOSPHATE 10 MG/ML IJ SOLN
10.0000 mg | Freq: Once | INTRAMUSCULAR | Status: AC
  Administered 2024-02-19: 10 mg via INTRAVENOUS
  Filled 2024-02-19: qty 1

## 2024-02-19 MED ORDER — HEPARIN SOD (PORK) LOCK FLUSH 100 UNIT/ML IV SOLN: 500.0000 [IU] | Freq: Once | INTRAVENOUS | Status: DC | PRN

## 2024-02-19 MED ORDER — SODIUM CHLORIDE 0.9% FLUSH
10.0000 mL | INTRAVENOUS | Status: DC | PRN
Start: 2024-02-19 — End: 2024-02-19

## 2024-02-19 MED ORDER — PROCHLORPERAZINE MALEATE 10 MG PO TABS
10.0000 mg | ORAL_TABLET | Freq: Once | ORAL | Status: AC
Start: 2024-02-19 — End: 2024-02-19
  Administered 2024-02-19: 10 mg via ORAL
  Filled 2024-02-19: qty 1

## 2024-02-19 NOTE — Patient Instructions (Signed)
 [facility] - A DEPT OF [facility]  Discharge Instructions: Thank you for choosing [facility] to provide your oncology and hematology care.   If you have a lab appointment with the Cancer Center, please go directly to the Cancer Center and check in at the registration area.   Wear comfortable clothing and clothing appropriate for easy access to any Portacath or PICC line.   We strive to give you quality time with your provider. You may need to reschedule your appointment if you arrive late (15 or more minutes).  Arriving late affects you and other patients whose appointments are after yours.  Also, if you miss three or more appointments without notifying the office, you may be dismissed from the clinic at the provider's discretion.      For prescription refill requests, have your pharmacy contact our office and allow 72 hours for refills to be completed.    Today you received the following chemotherapy and/or immunotherapy agents: Bevacizumab, Pemetrexed.       To help prevent nausea and vomiting after your treatment, we encourage you to take your nausea medication as directed.  BELOW ARE SYMPTOMS THAT SHOULD BE REPORTED IMMEDIATELY: *FEVER GREATER THAN 100.4 F (38 C) OR HIGHER *CHILLS OR SWEATING *NAUSEA AND VOMITING THAT IS NOT CONTROLLED WITH YOUR NAUSEA MEDICATION *UNUSUAL SHORTNESS OF BREATH *UNUSUAL BRUISING OR BLEEDING *URINARY PROBLEMS (pain or burning when urinating, or frequent urination) *BOWEL PROBLEMS (unusual diarrhea, constipation, pain near the anus) TENDERNESS IN MOUTH AND THROAT WITH OR WITHOUT PRESENCE OF ULCERS (sore throat, sores in mouth, or a toothache) UNUSUAL RASH, SWELLING OR PAIN  UNUSUAL VAGINAL DISCHARGE OR ITCHING   Items with * indicate a potential emergency and should be followed up as soon as possible or go to the Emergency Department if any problems should occur.  Please show the CHEMOTHERAPY ALERT CARD  or IMMUNOTHERAPY ALERT CARD at check-in to the Emergency Department and triage nurse.  Should you have questions after your visit or need to cancel or reschedule your appointment, please contact [facility] - A DEPT OF [facility]  Dept: [phone]  and follow the prompts.  Office hours are 8:00 a.m. to 4:30 p.m. Monday - Friday. Please note that voicemails left after 4:00 p.m. may not be returned until the following business day.  We are closed weekends and major holidays. You have access to a nurse at all times for urgent questions. Please call the main number to the clinic Dept: [phone] and follow the prompts.   For any non-urgent questions, you may also contact your provider using MyChart. We now offer e-Visits for anyone 93 and older to request care online for non-urgent symptoms. For details visit [email or web address].   Also download the MyChart app! Go to the app store, search "MyChart", open the app, select [place], and log in with your MyChart username and password.

## 2024-02-23 ENCOUNTER — Other Ambulatory Visit: Payer: Self-pay

## 2024-03-06 NOTE — Progress Notes (Signed)
 [facility] OFFICE PROGRESS NOTE  [name], PA-C [address] [place] [address]  DIAGNOSIS: Stage IV (T4, N2, M1 a) non-small cell lung cancer, adenocarcinoma presented with multifocal disease involving the right upper lobe, right lower lobe as well as suspicious lower paratracheal lymphadenopathy and groundglass nodules in the left lung diagnosed in May 2022. The patient had molecular studies performed by [facility] and that showed no detectable mutation but this is likely secondary to low-level circulating free tumor DNA   PRIOR THERAPY: None  CURRENT THERAPY:  Palliative systemic chemotherapy with carboplatin for AUC of 5, Alimta 500 Mg/M2 and Avastin 15 Mg/KG every 3 weeks.  The patient is not a great candidate for immunotherapy in the event that she has a genetic mutation.  She is status post 46 cycles. Starting from cycle #6, the patient will start maintenance Alimta and Avastin.    INTERVAL HISTORY: Kirsten Williams 53 y.o. female returns to the clinic today for a follow-up visit.  The patient was last seen 3 weeks ago by myself.   Overall the patient states she is feeling well today.  She gave me an update of what has been going on in the interval since last being seen.  She has been very busy with appointments.  She had been seeing gastroenterology for persistent bloating and stomach irritation so she is cut back on her dairy and she is on famotidine and Prilosec.  She also takes probiotics and Bentyl.  However she was recently put on doxycycline for recurrent skin boils and has had a history of GI upset with doxycycline in the past.  Since she started doxycycline she has noticed some increased abdominal discomfort and pressure.  She make sure to take doxycycline with food.  She also reports she has had similar symptoms with a urinary tract infection in the past.  Denies any dysuria but she would be appreciative of having a urinalysis checked.  Her liver enzymes  are typically normal on our labs but she reports she had lab work performed at another provider's office and they were elevated a few weeks ago.  They continue to be normal today per usual.  She did states she was taking Tylenol around the time she had her labs drawn.  She is not having any right upper quadrant discomfort and she is overall well-appearing today.  She also had her eye exam earlier this week.  She states that she was given a good report.  She denies any fever or chills.  She has night sweats intermittently at baseline which is unchanged.  She denies any changes in her breathing today with any worsening shortness of breath or cough.  Consistently after treatment she does experience a bandlike station around her diaphragm which is unchanged.  She sometimes may get intermittent nausea.  She denies any diarrhea or constipation except for this morning she had a few episodes of diarrhea which may be related to her recent antibiotic use.  She denies any headaches.  He saw her dermatologist recently who gave her a new cream for her rash on her neck.  He is here today for evaluation and repeat blood work before undergoing cycle #47.     MEDICAL HISTORY: Past Medical History:  Diagnosis Date   Adenocarcinoma, lung, right (HCC) 05/03/2021   Kirsten Williams is a 53 y.o. female with a history of lung nodules dating back to 2019 who is referred in consultation with [name], PA-C for assessment  and management. She is a former smoker having quit in 2016. 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 She missed her screening CT last year and imaging was ordered last month. CT chest from 02-16-2021 reveals pro   Anxiety    GERD (gastroesophageal reflux disease)    SLE (systemic lupus erythematosus) (HCC) 05/03/2021   SLE (systemic lupus erythematosus) (HCC) 05/03/2021    ALLERGIES:  is allergic to clindamycin/lincomycin and carboplatin.  MEDICATIONS:  Current Outpatient  Medications  Medication Sig Dispense Refill   CLOBETASOL PROPIONATE E 0.05 % emollient cream Apply topically.     doxycycline (VIBRA-TABS) 100 MG tablet Take 100 mg by mouth daily.     ketoconazole (NIZORAL) 2 % shampoo SMARTSIG:Topical 2-3 Times Weekly     potassium chloride 20 MEQ/15ML (10%) SOLN Take 15 mLs (20 mEq total) by mouth daily. 105 mL 0   amLODipine (NORVASC) 5 MG tablet Take 5 mg by mouth daily.     B Complex Vitamins (B COMPLEX 50 PO) Take by mouth.     Calcium Carbonate-Vit D-Min (CALCIUM 1200 PO) Take 1 capsule by mouth daily.     cholecalciferol (VITAMIN D3) 25 MCG (1000 UNIT) tablet Take 2,000 Units by mouth daily.     cyclobenzaprine (FLEXERIL) 10 MG tablet Take 10 mg by mouth at bedtime as needed for muscle spasms.     denosumab (PROLIA) 60 MG/ML SOSY injection Inject 60 mg into the skin every 6 (six) months.     dexamethasone (DECADRON) 4 MG tablet 4 mg p.o. twice daily the day before, day of and day after the chemotherapy every 3 weeks. 40 tablet 2   dicyclomine (BENTYL) 10 MG capsule Take 10 mg by mouth every 6 (six) hours as needed.     diphenhydrAMINE (BENADRYL) 25 MG tablet Take 25 mg by mouth daily as needed for allergies.     DULoxetine (CYMBALTA) 30 MG capsule Take 1 capsule (30 mg total) by mouth at bedtime. (Patient not taking: Reported on 12/18/2023) 30 capsule 3   famotidine (PEPCID) 20 MG tablet Take 20 mg by mouth at bedtime.     fluticasone (FLONASE) 50 MCG/ACT nasal spray Place 2 sprays into both nostrils daily.     folic acid (FOLVITE) 1 MG tablet TAKE 1 TABLET(1 MG) BY MOUTH DAILY 30 tablet 4   gabapentin (NEURONTIN) 300 MG capsule Take 300 mg by mouth 3 (three) times daily. Patient takes once or twice a day.     hydrocortisone 1 % lotion Apply 1 application topically 2 (two) times daily. 118 mL 0   hydroxychloroquine (PLAQUENIL) 200 MG tablet Take 200 mg by mouth 2 (two) times daily.     Multiple Vitamins-Minerals (MULTI ADULT GUMMIES) CHEW Chew 2 tablets  by mouth daily.     omeprazole (PRILOSEC) 40 MG capsule Take 40 mg by mouth daily as needed (acid reflux).     oxyCODONE-acetaminophen (PERCOCET/ROXICET) 5-325 MG tablet Take 1 tablet by mouth every 8 (eight) hours as needed for severe pain (pain score 7-10). 30 tablet 0   Probiotic Product (PROBIOTIC-10 PO) Take by mouth.     prochlorperazine (COMPAZINE) 10 MG tablet TAKE 1 TABLET(10 MG) BY MOUTH EVERY 6 HOURS AS NEEDED 30 tablet 2   rizatriptan (MAXALT-MLT) 10 MG disintegrating tablet Take 10 mg by mouth 2 (two) times daily as needed.     tetrahydrozoline 0.05 % ophthalmic solution Place 1 drop into both eyes once. Systane Eye Drops once daily both eyes  triamcinolone ointment (KENALOG) 0.5 % Apply topically 3 (three) times daily.     No current facility-administered medications for this visit.    SURGICAL HISTORY:  Past Surgical History:  Procedure Laterality Date   ABDOMINAL HYSTERECTOMY     BRONCHIAL BIOPSY  04/24/2021   Procedure: BRONCHIAL BIOPSIES;  Surgeon: Leslye Peer, MD;  Location: [facility] ENDOSCOPY;  Service: Pulmonary;;   BRONCHIAL BRUSHINGS  04/24/2021   Procedure: BRONCHIAL BRUSHINGS;  Surgeon: Leslye Peer, MD;  Location: [facility] ENDOSCOPY;  Service: Pulmonary;;   BRONCHIAL NEEDLE ASPIRATION BIOPSY  04/24/2021   Procedure: BRONCHIAL NEEDLE ASPIRATION BIOPSIES;  Surgeon: Leslye Peer, MD;  Location: [facility] ENDOSCOPY;  Service: Pulmonary;;   BRONCHIAL WASHINGS  04/24/2021   Procedure: BRONCHIAL WASHINGS;  Surgeon: Leslye Peer, MD;  Location: [facility] ENDOSCOPY;  Service: Pulmonary;;   CESAREAN SECTION  11/09/1990   DILATION AND CURETTAGE OF UTERUS Bilateral 09/09/1996   FIDUCIAL MARKER PLACEMENT  04/24/2021   Procedure: FIDUCIAL MARKER PLACEMENT;  Surgeon: Leslye Peer, MD;  Location: [facility] ENDOSCOPY;  Service: Pulmonary;;   TONSILLECTOMY  12/10/1977   VIDEO BRONCHOSCOPY WITH ENDOBRONCHIAL NAVIGATION Bilateral 04/24/2021   Procedure: VIDEO BRONCHOSCOPY WITH ENDOBRONCHIAL  NAVIGATION;  Surgeon: Leslye Peer, MD;  Location: [facility] ENDOSCOPY;  Service: Pulmonary;  Laterality: Bilateral;    REVIEW OF SYSTEMS:   Constitutional: Positive for fatigue 1 week following treatment typically and some decreased appetite a few days following treatment (appetite better at this time). Negative for chills and  fever.  HENT: Negative for mouth sores, nosebleeds, sore throat and trouble swallowing.   Eyes: Negative for eye problems and icterus.  Respiratory: Positive for stable intermittent shortness of breath with certain activities. No changes in baseline cough. Negative for hemoptysis and wheezing.  Cardiovascular: Negative for chest pain (gets chest tightness routinely after treatment but none at this time) and leg swelling.  Gastrointestinal: Positive for diarrhea this AM.. a. Positive for intermittent bloating.  Genitourinary: Positive for suprapubic discomfort. Negative for bladder incontinence, difficulty urinating, dysuria, frequency and hematuria.   Musculoskeletal: Positive for mild back soreness. Negative for gait problem, neck pain and neck stiffness.  Skin: Positive for skin boils and stable rash on neck.  Neurological: Negative for dizziness, extremity weakness, gait problem, headaches, light-headedness and seizures.  Hematological: Negative for adenopathy. Does not bruise/bleed easily.  Psychiatric/Behavioral: Negative for confusion, depression and sleep disturbance. The patient is not nervous/anxious.     PHYSICAL EXAMINATION:  Blood pressure (!) 154/80, pulse 96, temperature 97.6 F (36.4 C), temperature source Temporal, resp. rate 16, weight 98 lb 9.6 oz (44.7 kg), SpO2 100%.  ECOG PERFORMANCE STATUS: 1  Physical Exam  Constitutional: Oriented to person, place, and time and thin appearing female, and in no distress.  HENT:  Head: Normocephalic and atraumatic.  Mouth/Throat: Oropharynx is clear and moist. No oropharyngeal exudate.  Eyes: Conjunctivae are  normal. Right eye exhibits no discharge. Left eye exhibits no discharge. No scleral icterus.  Neck: Normal range of motion. Neck supple.  Cardiovascular: Normal rate, regular rhythm, normal heart sounds and intact distal pulses.   Pulmonary/Chest: Effort normal and breath sounds normal. No respiratory distress. No wheezes. No rales.  Abdominal: Soft. Bowel sounds are normal. Exhibits no distension and no mass. There is no tenderness.  Musculoskeletal: Normal range of motion. Exhibits no edema.  Lymphadenopathy:    No cervical adenopathy.  Neurological: Alert and oriented to person, place, and time. Exhibits normal muscle tone. Gait normal. Coordination normal.  Skin: Skin is warm  and dry .Positive for rash on upper chest and back.  Psychiatric: Mood, memory and judgment normal.  Vitals reviewed.  LABORATORY DATA: Lab Results  Component Value Date   WBC 7.6 03/11/2024   HGB 12.6 03/11/2024   HCT 40.0 03/11/2024   MCV 95.2 03/11/2024   PLT 260 03/11/2024      Chemistry      Component Value Date/Time   NA 141 03/11/2024 1019   NA 140 03/13/2021 0000   K 3.1 (L) 03/11/2024 1019   CL 106 03/11/2024 1019   CO2 25 03/11/2024 1019   BUN 7 03/11/2024 1019   BUN 10 03/13/2021 0000   CREATININE 0.79 03/11/2024 1019   GLU 107 03/13/2021 0000      Component Value Date/Time   CALCIUM 8.6 (L) 03/11/2024 1019   ALKPHOS 115 03/11/2024 1019   AST 22 03/11/2024 1019   ALT 10 03/11/2024 1019   BILITOT 0.3 03/11/2024 1019       RADIOGRAPHIC STUDIES:  No results found.   ASSESSMENT/PLAN:  This is a very pleasant 53 year old Caucasian female diagnosed with stage IV (T4, N2, M1 a) non-small cell lung cancer, adenocarcinoma presented with multifocal disease involving the right upper lobe, right lower lobe as well as suspicious lower paratracheal lymphadenopathy and groundglass nodules in the left lung diagnosed in May 2022. The patient had molecular studies performed by [facility] [employer] and  that showed no detectable mutation but this is likely secondary to low-level circulating free tumor DNA. If the patient has disease progression in the future, then we will likely retest her for molecular studies.    The patient is currently undergoing systemic chemotherapy with carboplatin for an AUC 5, Alimta 500 mg per metered square, and and Avastin 15 mg/kg IV every 3 weeks.  She is status post 46 cycles.  Starting from cycle #7, the patient has been on maintenance treatment with Alimta and Avastin.   Labs were reviewed . Recommend she proceed with cycle 47 today as scheduled. She is ok to treat with urine protein of 100.   We will see her back for labs and follow up in 3 weeks for evaluation before undergoing cycle #48.  Will check a urinalysis today just to ensure she does not have a urinary tract infection as she has had similar symptoms with suprapubic pressure in the past with urinary tract infections.  She is overall well-appearing today.  She had diarrhea starting this morning but this could be secondary to her antibiotic use.  She will monitor for now and hydrate well at home.  She will continue to eat food with taking her antibiotic.  She will also continue her omeprazole, Bentyl, and famotidine.  I let the patient know that her liver enzymes were normal on her labs today, as they typically are.  He has an upcoming appointment with a dentist.  She will let us know if they are planning on arranging for implants so we can coordinate her infusions since she is on Avastin.  Will continue to use the steroid cream for her rash as prescribed by her dermatologist.   The patient was advised to call immediately if she has any concerning symptoms in the interval. The patient voices understanding of current disease status and treatment options and is in agreement with the current care plan. All questions were answered. The patient knows to call the clinic with any problems, questions or  concerns. We can certainly see the patient much sooner if necessary  Orders Placed This Encounter  Procedures   Urine Culture    Standing Status:   Future    Number of Occurrences:   1    Expected Date:   03/11/2024    Expiration Date:   03/11/2025   Urinalysis, Complete w Microscopic    Standing Status:   Future    Number of Occurrences:   1    Expected Date:   03/11/2024    Expiration Date:   03/11/2025    The total time spent in the appointment was 20-29 minutes  Oriana Horiuchi L Jelani Vreeland, PA-C 03/11/24

## 2024-03-11 ENCOUNTER — Encounter: Payer: Self-pay | Admitting: Sports Medicine

## 2024-03-11 ENCOUNTER — Encounter: Payer: Self-pay | Admitting: Internal Medicine

## 2024-03-11 ENCOUNTER — Inpatient Hospital Stay: Payer: Commercial Managed Care - PPO

## 2024-03-11 ENCOUNTER — Inpatient Hospital Stay: Payer: Commercial Managed Care - PPO | Attending: Hematology and Oncology

## 2024-03-11 ENCOUNTER — Other Ambulatory Visit: Payer: Self-pay

## 2024-03-11 ENCOUNTER — Inpatient Hospital Stay: Payer: Commercial Managed Care - PPO | Admitting: Physician Assistant

## 2024-03-11 VITALS — BP 154/80 | HR 96 | Temp 97.6°F | Resp 16 | Wt 98.6 lb

## 2024-03-11 VITALS — BP 160/86 | HR 88 | Resp 16

## 2024-03-11 DIAGNOSIS — Z5112 Encounter for antineoplastic immunotherapy: Secondary | ICD-10-CM | POA: Insufficient documentation

## 2024-03-11 DIAGNOSIS — R809 Proteinuria, unspecified: Secondary | ICD-10-CM | POA: Insufficient documentation

## 2024-03-11 DIAGNOSIS — C3411 Malignant neoplasm of upper lobe, right bronchus or lung: Secondary | ICD-10-CM | POA: Insufficient documentation

## 2024-03-11 DIAGNOSIS — K219 Gastro-esophageal reflux disease without esophagitis: Secondary | ICD-10-CM | POA: Diagnosis not present

## 2024-03-11 DIAGNOSIS — Z79899 Other long term (current) drug therapy: Secondary | ICD-10-CM | POA: Insufficient documentation

## 2024-03-11 DIAGNOSIS — C3491 Malignant neoplasm of unspecified part of right bronchus or lung: Secondary | ICD-10-CM

## 2024-03-11 DIAGNOSIS — E876 Hypokalemia: Secondary | ICD-10-CM

## 2024-03-11 DIAGNOSIS — Z5111 Encounter for antineoplastic chemotherapy: Secondary | ICD-10-CM | POA: Diagnosis not present

## 2024-03-11 DIAGNOSIS — Z87440 Personal history of urinary (tract) infections: Secondary | ICD-10-CM | POA: Diagnosis not present

## 2024-03-11 DIAGNOSIS — R300 Dysuria: Secondary | ICD-10-CM | POA: Diagnosis not present

## 2024-03-11 DIAGNOSIS — M329 Systemic lupus erythematosus, unspecified: Secondary | ICD-10-CM | POA: Diagnosis not present

## 2024-03-11 LAB — CBC WITH DIFFERENTIAL (CANCER CENTER ONLY)
Abs Immature Granulocytes: 0.02 10*3/uL (ref 0.00–0.07)
Basophils Absolute: 0.1 10*3/uL (ref 0.0–0.1)
Basophils Relative: 1 %
Eosinophils Absolute: 0.1 10*3/uL (ref 0.0–0.5)
Eosinophils Relative: 1 %
HCT: 40 % (ref 36.0–46.0)
Hemoglobin: 12.6 g/dL (ref 12.0–15.0)
Immature Granulocytes: 0 %
Lymphocytes Relative: 31 %
Lymphs Abs: 2.4 10*3/uL (ref 0.7–4.0)
MCH: 30 pg (ref 26.0–34.0)
MCHC: 31.5 g/dL (ref 30.0–36.0)
MCV: 95.2 fL (ref 80.0–100.0)
Monocytes Absolute: 0.7 10*3/uL (ref 0.1–1.0)
Monocytes Relative: 9 %
Neutro Abs: 4.4 10*3/uL (ref 1.7–7.7)
Neutrophils Relative %: 58 %
Platelet Count: 260 10*3/uL (ref 150–400)
RBC: 4.2 MIL/uL (ref 3.87–5.11)
RDW: 17 % — ABNORMAL HIGH (ref 11.5–15.5)
WBC Count: 7.6 10*3/uL (ref 4.0–10.5)
nRBC: 0 % (ref 0.0–0.2)

## 2024-03-11 LAB — CMP (CANCER CENTER ONLY)
ALT: 10 U/L (ref 0–44)
AST: 22 U/L (ref 15–41)
Albumin: 3.8 g/dL (ref 3.5–5.0)
Alkaline Phosphatase: 115 U/L (ref 38–126)
Anion gap: 10 (ref 5–15)
BUN: 7 mg/dL (ref 6–20)
CO2: 25 mmol/L (ref 22–32)
Calcium: 8.6 mg/dL — ABNORMAL LOW (ref 8.9–10.3)
Chloride: 106 mmol/L (ref 98–111)
Creatinine: 0.79 mg/dL (ref 0.44–1.00)
GFR, Estimated: >60 mL/min (ref >60)
Glucose, Bld: 115 mg/dL — ABNORMAL HIGH (ref 70–99)
Potassium: 3.1 mmol/L — ABNORMAL LOW (ref 3.5–5.1)
Sodium: 141 mmol/L (ref 135–145)
Total Bilirubin: 0.3 mg/dL (ref 0.0–1.2)
Total Protein: 7.5 g/dL (ref 6.5–8.1)

## 2024-03-11 LAB — URINALYSIS, COMPLETE (UACMP) WITH MICROSCOPIC
Bacteria, UA: NONE SEEN
Bilirubin Urine: NEGATIVE
Glucose, UA: NEGATIVE mg/dL
Hgb urine dipstick: NEGATIVE
Ketones, ur: NEGATIVE mg/dL
Leukocytes,Ua: NEGATIVE
Nitrite: NEGATIVE
Protein, ur: 30 mg/dL — AB
Specific Gravity, Urine: 1.001 — ABNORMAL LOW (ref 1.005–1.030)
pH: 6 (ref 5.0–8.0)

## 2024-03-11 LAB — TOTAL PROTEIN, URINE DIPSTICK: Protein, ur: 100 mg/dL — AB

## 2024-03-11 MED ORDER — CYANOCOBALAMIN 1000 MCG/ML IJ SOLN
1000.0000 ug | Freq: Once | INTRAMUSCULAR | Status: AC
  Administered 2024-03-11: 1000 ug via INTRAMUSCULAR
  Filled 2024-03-11: qty 1

## 2024-03-11 MED ORDER — SODIUM CHLORIDE 0.9 % IV SOLN: Freq: Once | INTRAVENOUS | Status: AC

## 2024-03-11 MED ORDER — SODIUM CHLORIDE 0.9 % IV SOLN
700.0000 mg | Freq: Once | INTRAVENOUS | Status: AC
  Administered 2024-03-11: 700 mg via INTRAVENOUS
  Filled 2024-03-11: qty 12

## 2024-03-11 MED ORDER — SODIUM CHLORIDE 0.9 % IV SOLN
500.0000 mg/m2 | Freq: Once | INTRAVENOUS | Status: AC
  Administered 2024-03-11: 700 mg via INTRAVENOUS
  Filled 2024-03-11: qty 20

## 2024-03-11 MED ORDER — POTASSIUM CHLORIDE 20 MEQ/15ML (10%) PO SOLN
20.0000 meq | Freq: Every day | ORAL | 0 refills | Status: DC
Start: 2024-03-11 — End: 2024-04-01

## 2024-03-11 MED ORDER — DEXAMETHASONE SODIUM PHOSPHATE 10 MG/ML IJ SOLN
10.0000 mg | Freq: Once | INTRAMUSCULAR | Status: AC
  Administered 2024-03-11: 10 mg via INTRAVENOUS
  Filled 2024-03-11: qty 1

## 2024-03-11 MED ORDER — PROCHLORPERAZINE MALEATE 10 MG PO TABS
10.0000 mg | ORAL_TABLET | Freq: Once | ORAL | Status: AC
  Administered 2024-03-11: 10 mg via ORAL
  Filled 2024-03-11: qty 1

## 2024-03-11 NOTE — Patient Instructions (Signed)
 [facility] - A DEPT OF [facility]  Discharge Instructions: Thank you for choosing [facility] to provide your oncology and hematology care.   If you have a lab appointment with the Cancer Center, please go directly to the Cancer Center and check in at the registration area.   Wear comfortable clothing and clothing appropriate for easy access to any Portacath or PICC line.   We strive to give you quality time with your provider. You may need to reschedule your appointment if you arrive late (15 or more minutes).  Arriving late affects you and other patients whose appointments are after yours.  Also, if you miss three or more appointments without notifying the office, you may be dismissed from the clinic at the provider's discretion.      For prescription refill requests, have your pharmacy contact our office and allow 72 hours for refills to be completed.    Today you received the following chemotherapy and/or immunotherapy agents: Bevacizumab, Pemetrexed.       To help prevent nausea and vomiting after your treatment, we encourage you to take your nausea medication as directed.  BELOW ARE SYMPTOMS THAT SHOULD BE REPORTED IMMEDIATELY: *FEVER GREATER THAN 100.4 F (38 C) OR HIGHER *CHILLS OR SWEATING *NAUSEA AND VOMITING THAT IS NOT CONTROLLED WITH YOUR NAUSEA MEDICATION *UNUSUAL SHORTNESS OF BREATH *UNUSUAL BRUISING OR BLEEDING *URINARY PROBLEMS (pain or burning when urinating, or frequent urination) *BOWEL PROBLEMS (unusual diarrhea, constipation, pain near the anus) TENDERNESS IN MOUTH AND THROAT WITH OR WITHOUT PRESENCE OF ULCERS (sore throat, sores in mouth, or a toothache) UNUSUAL RASH, SWELLING OR PAIN  UNUSUAL VAGINAL DISCHARGE OR ITCHING   Items with * indicate a potential emergency and should be followed up as soon as possible or go to the Emergency Department if any problems should occur.  Please show the CHEMOTHERAPY ALERT CARD  or IMMUNOTHERAPY ALERT CARD at check-in to the Emergency Department and triage nurse.  Should you have questions after your visit or need to cancel or reschedule your appointment, please contact [facility] - A DEPT OF [facility]  Dept: [phone]  and follow the prompts.  Office hours are 8:00 a.m. to 4:30 p.m. Monday - Friday. Please note that voicemails left after 4:00 p.m. may not be returned until the following business day.  We are closed weekends and major holidays. You have access to a nurse at all times for urgent questions. Please call the main number to the clinic Dept: [phone] and follow the prompts.   For any non-urgent questions, you may also contact your provider using MyChart. We now offer e-Visits for anyone 93 and older to request care online for non-urgent symptoms. For details visit [email or web address].   Also download the MyChart app! Go to the app store, search "MyChart", open the app, select [place], and log in with your MyChart username and password.

## 2024-03-12 LAB — URINE CULTURE: Culture: NO GROWTH

## 2024-03-13 ENCOUNTER — Other Ambulatory Visit: Payer: Self-pay

## 2024-03-15 ENCOUNTER — Other Ambulatory Visit: Payer: Self-pay | Admitting: Physician Assistant

## 2024-03-15 DIAGNOSIS — E876 Hypokalemia: Secondary | ICD-10-CM

## 2024-03-16 NOTE — Telephone Encounter (Signed)
 This was only supposed to be a short course. Was she able to fill this? If filled, no need to renew it. If it is not in stock, then please let me know

## 2024-03-17 ENCOUNTER — Other Ambulatory Visit: Payer: Self-pay

## 2024-03-19 ENCOUNTER — Other Ambulatory Visit: Payer: Self-pay | Admitting: Physician Assistant

## 2024-03-24 ENCOUNTER — Other Ambulatory Visit: Payer: Self-pay

## 2024-04-01 ENCOUNTER — Inpatient Hospital Stay: Payer: Commercial Managed Care - PPO

## 2024-04-01 ENCOUNTER — Inpatient Hospital Stay: Payer: Commercial Managed Care - PPO | Admitting: Internal Medicine

## 2024-04-01 ENCOUNTER — Inpatient Hospital Stay: Admitting: Dietician

## 2024-04-01 VITALS — BP 128/72 | HR 91 | Temp 98.0°F | Resp 17 | Ht 60.0 in | Wt 97.8 lb

## 2024-04-01 DIAGNOSIS — C3491 Malignant neoplasm of unspecified part of right bronchus or lung: Secondary | ICD-10-CM

## 2024-04-01 DIAGNOSIS — E876 Hypokalemia: Secondary | ICD-10-CM | POA: Diagnosis not present

## 2024-04-01 DIAGNOSIS — C3490 Malignant neoplasm of unspecified part of unspecified bronchus or lung: Secondary | ICD-10-CM | POA: Diagnosis not present

## 2024-04-01 DIAGNOSIS — Z5112 Encounter for antineoplastic immunotherapy: Secondary | ICD-10-CM | POA: Diagnosis not present

## 2024-04-01 LAB — CMP (CANCER CENTER ONLY)
ALT: 9 U/L (ref 0–44)
AST: 19 U/L (ref 15–41)
Albumin: 3.6 g/dL (ref 3.5–5.0)
Alkaline Phosphatase: 100 U/L (ref 38–126)
Anion gap: 11 (ref 5–15)
BUN: 6 mg/dL (ref 6–20)
CO2: 25 mmol/L (ref 22–32)
Calcium: 8.6 mg/dL — ABNORMAL LOW (ref 8.9–10.3)
Chloride: 105 mmol/L (ref 98–111)
Creatinine: 0.83 mg/dL (ref 0.44–1.00)
GFR, Estimated: >60 mL/min (ref >60)
Glucose, Bld: 166 mg/dL — ABNORMAL HIGH (ref 70–99)
Potassium: 2.8 mmol/L — ABNORMAL LOW (ref 3.5–5.1)
Sodium: 141 mmol/L (ref 135–145)
Total Bilirubin: 0.3 mg/dL (ref 0.0–1.2)
Total Protein: 6.9 g/dL (ref 6.5–8.1)

## 2024-04-01 LAB — CBC WITH DIFFERENTIAL (CANCER CENTER ONLY)
Abs Immature Granulocytes: 0.01 10*3/uL (ref 0.00–0.07)
Basophils Absolute: 0 10*3/uL (ref 0.0–0.1)
Basophils Relative: 1 %
Eosinophils Absolute: 0.1 10*3/uL (ref 0.0–0.5)
Eosinophils Relative: 1 %
HCT: 33.8 % — ABNORMAL LOW (ref 36.0–46.0)
Hemoglobin: 11.1 g/dL — ABNORMAL LOW (ref 12.0–15.0)
Immature Granulocytes: 0 %
Lymphocytes Relative: 25 %
Lymphs Abs: 1.7 10*3/uL (ref 0.7–4.0)
MCH: 30.3 pg (ref 26.0–34.0)
MCHC: 32.8 g/dL (ref 30.0–36.0)
MCV: 92.3 fL (ref 80.0–100.0)
Monocytes Absolute: 0.9 10*3/uL (ref 0.1–1.0)
Monocytes Relative: 13 %
Neutro Abs: 4.1 10*3/uL (ref 1.7–7.7)
Neutrophils Relative %: 60 %
Platelet Count: 276 10*3/uL (ref 150–400)
RBC: 3.66 MIL/uL — ABNORMAL LOW (ref 3.87–5.11)
RDW: 17.3 % — ABNORMAL HIGH (ref 11.5–15.5)
WBC Count: 6.9 10*3/uL (ref 4.0–10.5)
nRBC: 0 % (ref 0.0–0.2)

## 2024-04-01 LAB — TOTAL PROTEIN, URINE DIPSTICK: Protein, ur: 100 mg/dL — AB

## 2024-04-01 MED ORDER — SODIUM CHLORIDE 0.9 % IV SOLN: Freq: Once | INTRAVENOUS | Status: AC

## 2024-04-01 MED ORDER — PEMETREXED DISODIUM CHEMO 500 MG/20ML IV SOLN
500.0000 mg/m2 | Freq: Once | INTRAVENOUS | Status: AC
  Administered 2024-04-01: 700 mg via INTRAVENOUS
  Filled 2024-04-01: qty 20

## 2024-04-01 MED ORDER — POTASSIUM CHLORIDE 20 MEQ/15ML (10%) PO SOLN: 20.0000 meq | Freq: Every day | ORAL | 0 refills | Status: DC

## 2024-04-01 MED ORDER — SODIUM CHLORIDE 0.9 % IV SOLN
700.0000 mg | Freq: Once | INTRAVENOUS | Status: AC
  Administered 2024-04-01: 700 mg via INTRAVENOUS
  Filled 2024-04-01: qty 12

## 2024-04-01 MED ORDER — DEXAMETHASONE SODIUM PHOSPHATE 10 MG/ML IJ SOLN
10.0000 mg | Freq: Once | INTRAMUSCULAR | Status: AC
  Administered 2024-04-01: 10 mg via INTRAVENOUS
  Filled 2024-04-01: qty 1

## 2024-04-01 MED ORDER — PROCHLORPERAZINE MALEATE 10 MG PO TABS
10.0000 mg | ORAL_TABLET | Freq: Once | ORAL | Status: AC
  Administered 2024-04-01: 10 mg via ORAL
  Filled 2024-04-01: qty 1

## 2024-04-01 NOTE — Patient Instructions (Addendum)
 [facility] - A DEPT OF [facility]  Discharge Instructions: Thank you for choosing [facility] to provide your oncology and hematology care.   If you have a lab appointment with the Cancer Center, please go directly to the Cancer Center and check in at the registration area.   Wear comfortable clothing and clothing appropriate for easy access to any Portacath or PICC line.   We strive to give you quality time with your provider. You may need to reschedule your appointment if you arrive late (15 or more minutes).  Arriving late affects you and other patients whose appointments are after yours.  Also, if you miss three or more appointments without notifying the office, you may be dismissed from the clinic at the provider's discretion.      For prescription refill requests, have your pharmacy contact our office and allow 72 hours for refills to be completed.    Today you received the following chemotherapy and/or immunotherapy agents: Bevacizumab  and Alimta       To help prevent nausea and vomiting after your treatment, we encourage you to take your nausea medication as directed.  BELOW ARE SYMPTOMS THAT SHOULD BE REPORTED IMMEDIATELY: *FEVER GREATER THAN 100.4 F (38 C) OR HIGHER *CHILLS OR SWEATING *NAUSEA AND VOMITING THAT IS NOT CONTROLLED WITH YOUR NAUSEA MEDICATION *UNUSUAL SHORTNESS OF BREATH *UNUSUAL BRUISING OR BLEEDING *URINARY PROBLEMS (pain or burning when urinating, or frequent urination) *BOWEL PROBLEMS (unusual diarrhea, constipation, pain near the anus) TENDERNESS IN MOUTH AND THROAT WITH OR WITHOUT PRESENCE OF ULCERS (sore throat, sores in mouth, or a toothache) UNUSUAL RASH, SWELLING OR PAIN  UNUSUAL VAGINAL DISCHARGE OR ITCHING   Items with * indicate a potential emergency and should be followed up as soon as possible or go to the Emergency Department if any problems should occur.  Please show the CHEMOTHERAPY ALERT CARD or  IMMUNOTHERAPY ALERT CARD at check-in to the Emergency Department and triage nurse.  Should you have questions after your visit or need to cancel or reschedule your appointment, please contact [facility] - A DEPT OF [facility]  Dept: [phone]  and follow the prompts.  Office hours are 8:00 a.m. to 4:30 p.m. Monday - Friday. Please note that voicemails left after 4:00 p.m. may not be returned until the following business day.  We are closed weekends and major holidays. You have access to a nurse at all times for urgent questions. Please call the main number to the clinic Dept: [phone] and follow the prompts.   For any non-urgent questions, you may also contact your provider using MyChart. We now offer e-Visits for anyone 41 and older to request care online for non-urgent symptoms. For details visit [email or web address].   Also download the MyChart app! Go to the app store, search "MyChart", open the app, select [place], and log in with your MyChart username and password.

## 2024-04-01 NOTE — Progress Notes (Signed)
 [facility] [phone]) [phone]   [phone]) [phone]  OFFICE PROGRESS NOTE  [name], PA-C [address] [place] [address]  DIAGNOSIS:  Stage IV (T4, N2, M1 a) non-small cell lung cancer, adenocarcinoma presented with multifocal disease involving the right upper lobe, right lower lobe as well as suspicious lower paratracheal lymphadenopathy and groundglass nodules in the left lung diagnosed in May 2022. The patient had molecular studies performed by [facility] and that showed no detectable mutation but this is likely secondary to low-level circulating free tumor DNA.  PRIOR THERAPY: None.  CURRENT THERAPY: palliative systemic chemotherapy with carboplatin  for AUC of 5, Alimta  500 Mg/M2 and Avastin  15 Mg/KG every 3 weeks.  The patient is not a great candidate for immunotherapy because of her history of active systemic lupus erythematosus.  Starting from cycle #7 the patient is on maintenance treatment with Alimta  and Avastin  every 3 weeks.  She is status post 47 cycles.  INTERVAL HISTORY: Kirsten Williams 53 y.o. female returns to the clinic today for follow-up visit.  Discussed the use of AI scribe software for clinical note transcription with the patient, who gave verbal consent to proceed.  History of Present Illness   Kirsten Williams is a 53 year old female undergoing evaluation before starting her 48th cycle of treatment for multiple myeloma.  She is concerned about her weight, currently at 97.8 pounds, down from 99 pounds previously. There is a family history of low weight, as her grandmother weighed 83 pounds at the time of her passing.  She experiences gastric area pain and takes Prilosec every morning and famotidine , though she is unsure of its effectiveness.  She is scheduled for a bone density test, mammogram, and Pap smear tomorrow. Additionally, she has an upcoming appointment with a nutritionist and a gastrointestinal specialist in  May.  Her lab work shows a urine protein level of 100, which is higher than usual but not at a concerning level for her treatment. She is scheduled for a scan ten days before her next visit.       MEDICAL HISTORY: Past Medical History:  Diagnosis Date   Adenocarcinoma, lung, right (HCC) 05/03/2021   Kirsten Williams is a 53 y.o. female with a history of lung nodules dating back to 2019 who is referred in consultation with [name], PA-C for assessment and management. She is a former smoker having quit in 2016. 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 She missed her screening CT last year and imaging was ordered last month. CT chest from 02-16-2021 reveals pro   Anxiety    GERD (gastroesophageal reflux disease)    SLE (systemic lupus erythematosus) (HCC) 05/03/2021   SLE (systemic lupus erythematosus) (HCC) 05/03/2021    ALLERGIES:  is allergic to clindamycin/lincomycin and carboplatin .  MEDICATIONS:  Current Outpatient Medications  Medication Sig Dispense Refill   amLODipine (NORVASC) 5 MG tablet Take 5 mg by mouth daily.     B Complex Vitamins (B COMPLEX 50 PO) Take by mouth.     Calcium Carbonate-Vit D-Min (CALCIUM 1200 PO) Take 1 capsule by mouth daily.     cholecalciferol (VITAMIN D3) 25 MCG (1000 UNIT) tablet Take 2,000 Units by mouth daily.     CLOBETASOL PROPIONATE E 0.05 % emollient cream Apply topically.     cyclobenzaprine (FLEXERIL) 10 MG tablet Take 10 mg by mouth at bedtime as needed for muscle spasms.     denosumab  (PROLIA )  60 MG/ML SOSY injection Inject 60 mg into the skin every 6 (six) months.     dexamethasone  (DECADRON ) 4 MG tablet 4 mg p.o. twice daily the day before, day of and day after the chemotherapy every 3 weeks. 40 tablet 2   dicyclomine (BENTYL) 10 MG capsule Take 10 mg by mouth every 6 (six) hours as needed.     diphenhydrAMINE  (BENADRYL ) 25 MG tablet Take 25 mg by mouth daily as needed for allergies.     doxycycline  (VIBRA-TABS) 100 MG tablet Take 100 mg by mouth daily.     DULoxetine  (CYMBALTA ) 30 MG capsule Take 1 capsule (30 mg total) by mouth at bedtime. (Patient not taking: Reported on 12/18/2023) 30 capsule 3   famotidine  (PEPCID ) 20 MG tablet Take 20 mg by mouth at bedtime.     fluticasone (FLONASE) 50 MCG/ACT nasal spray Place 2 sprays into both nostrils daily.     folic acid  (FOLVITE ) 1 MG tablet TAKE 1 TABLET(1 MG) BY MOUTH DAILY 30 tablet 4   gabapentin (NEURONTIN) 300 MG capsule Take 300 mg by mouth 3 (three) times daily. Patient takes once or twice a day.     hydrocortisone  1 % lotion Apply 1 application topically 2 (two) times daily. 118 mL 0   hydroxychloroquine (PLAQUENIL) 200 MG tablet Take 200 mg by mouth 2 (two) times daily.     ketoconazole (NIZORAL) 2 % shampoo SMARTSIG:Topical 2-3 Times Weekly     Multiple Vitamins-Minerals (MULTI ADULT GUMMIES) CHEW Chew 2 tablets by mouth daily.     omeprazole (PRILOSEC) 40 MG capsule Take 40 mg by mouth daily as needed (acid reflux).     oxyCODONE -acetaminophen  (PERCOCET/ROXICET) 5-325 MG tablet Take 1 tablet by mouth every 8 (eight) hours as needed for severe pain (pain score 7-10). 30 tablet 0   potassium chloride  20 MEQ/15ML (10%) SOLN Take 15 mLs (20 mEq total) by mouth daily. 105 mL 0   Probiotic Product (PROBIOTIC-10 PO) Take by mouth.     prochlorperazine  (COMPAZINE ) 10 MG tablet TAKE 1 TABLET(10 MG) BY MOUTH EVERY 6 HOURS AS NEEDED 30 tablet 2   rizatriptan (MAXALT-MLT) 10 MG disintegrating tablet Take 10 mg by mouth 2 (two) times daily as needed.     tetrahydrozoline 0.05 % ophthalmic solution Place 1 drop into both eyes once. Systane Eye Drops once daily both eyes     triamcinolone ointment (KENALOG) 0.5 % Apply topically 3 (three) times daily.     No current facility-administered medications for this visit.    SURGICAL HISTORY:  Past Surgical History:  Procedure Laterality Date   ABDOMINAL HYSTERECTOMY     BRONCHIAL BIOPSY  04/24/2021    Procedure: BRONCHIAL BIOPSIES;  Surgeon: Denson Flake, MD;  Location: [facility] ENDOSCOPY;  Service: Pulmonary;;   BRONCHIAL BRUSHINGS  04/24/2021   Procedure: BRONCHIAL BRUSHINGS;  Surgeon: Denson Flake, MD;  Location: [facility] ENDOSCOPY;  Service: Pulmonary;;   BRONCHIAL NEEDLE ASPIRATION BIOPSY  04/24/2021   Procedure: BRONCHIAL NEEDLE ASPIRATION BIOPSIES;  Surgeon: Denson Flake, MD;  Location: [facility] ENDOSCOPY;  Service: Pulmonary;;   BRONCHIAL WASHINGS  04/24/2021   Procedure: BRONCHIAL WASHINGS;  Surgeon: Denson Flake, MD;  Location: [facility] ENDOSCOPY;  Service: Pulmonary;;   CESAREAN SECTION  11/09/1990   DILATION AND CURETTAGE OF UTERUS Bilateral 09/09/1996   FIDUCIAL MARKER PLACEMENT  04/24/2021   Procedure: FIDUCIAL MARKER PLACEMENT;  Surgeon: Denson Flake, MD;  Location: [facility] ENDOSCOPY;  Service: Pulmonary;;   TONSILLECTOMY  12/10/1977   VIDEO  BRONCHOSCOPY WITH ENDOBRONCHIAL NAVIGATION Bilateral 04/24/2021   Procedure: VIDEO BRONCHOSCOPY WITH ENDOBRONCHIAL NAVIGATION;  Surgeon: Denson Flake, MD;  Location: [facility] ENDOSCOPY;  Service: Pulmonary;  Laterality: Bilateral;    REVIEW OF SYSTEMS:  A comprehensive review of systems was negative except for: Constitutional: positive for fatigue and weight loss   PHYSICAL EXAMINATION: General appearance: alert, cooperative, fatigued, and no distress Head: Normocephalic, without obvious abnormality, atraumatic Neck: no adenopathy, no JVD, supple, symmetrical, trachea midline, and thyroid  not enlarged, symmetric, no tenderness/mass/nodules Lymph nodes: Cervical, supraclavicular, and axillary nodes normal. Resp: clear to auscultation bilaterally Back: symmetric, no curvature. ROM normal. No CVA tenderness. Cardio: regular rate and rhythm, S1, S2 normal, no murmur, click, rub or gallop GI: soft, non-tender; bowel sounds normal; no masses,  no organomegaly Extremities: extremities normal, atraumatic, no cyanosis or edema  ECOG PERFORMANCE STATUS: 1 -  Symptomatic but completely ambulatory  Blood pressure 128/72, pulse 91, temperature 98 F (36.7 C), resp. rate 17, height 5' (1.524 m), weight 97 lb 12.8 oz (44.4 kg), SpO2 100%.  LABORATORY DATA: Lab Results  Component Value Date   WBC 6.9 04/01/2024   HGB 11.1 (L) 04/01/2024   HCT 33.8 (L) 04/01/2024   MCV 92.3 04/01/2024   PLT 276 04/01/2024      Chemistry      Component Value Date/Time   NA 141 04/01/2024 1050   NA 140 03/13/2021 0000   K 2.8 (L) 04/01/2024 1050   CL 105 04/01/2024 1050   CO2 25 04/01/2024 1050   BUN 6 04/01/2024 1050   BUN 10 03/13/2021 0000   CREATININE 0.83 04/01/2024 1050   GLU 107 03/13/2021 0000      Component Value Date/Time   CALCIUM 8.6 (L) 04/01/2024 1050   ALKPHOS 100 04/01/2024 1050   AST 19 04/01/2024 1050   ALT 9 04/01/2024 1050   BILITOT 0.3 04/01/2024 1050       RADIOGRAPHIC STUDIES: No results found.   ASSESSMENT AND PLAN:  This is a very pleasant 53 years old white female recently diagnosed with a stage IV non-small cell lung cancer, adenocarcinoma with no actionable mutation diagnosed in May 2022.  The patient has also history of systemic lupus erythematosus and she is not a candidate for immunotherapy. She is currently undergoing systemic chemotherapy with carboplatin  for AUC of 5, Alimta  500 Mg/M2 and Avastin  15 Mg/KG every 3 weeks status post 47 cycles.  Starting from cycle #7 the patient is on maintenance treatment with Alimta  and Avastin  every 3 weeks.      Stage IV (T4, N2, M1 a) non-small cell lung cancer, adenocarcinoma presented with multifocal disease involving the right upper lobe, right lower lobe as well as suspicious lower paratracheal lymphadenopathy and groundglass nodules in the left lung diagnosed in May 2022.  She is currently on palliative systemic chemotherapy with carboplatin  for AUC of 5, Alimta  500 Mg/M2 and Avastin  15 Mg/KG every 3 weeks.  The patient is not a great candidate for immunotherapy because of  her history of active systemic lupus erythematosus.  Starting from cycle #7 the patient is on maintenance treatment with Alimta  and Avastin  every 3 weeks.  She is status post 47 cycles. Proceed with cycle #48 of her treatment today as planned. Schedule CT scan of the chest, abdomen and pelvis before the next visit.   Proteinuria Proteinuria is present with a urine protein level of 100, which is elevated but manageable. Monitoring is essential to prevent reaching a level of 300, which would  require a change in management. A scan is planned to assess the current status before the next visit. - Continue current treatment regimen. - Order scan to be performed ten days before the next visit.  Gastroesophageal reflux disease Experiencing gastric pain, possibly related to GERD. Currently on famotidine  and omeprazole daily. The effectiveness of famotidine  is uncertain, but omeprazole is taken regularly. Differential diagnosis includes acid reflux as a potential cause of the gastric pain. - Continue omeprazole daily. - Evaluate the effectiveness of famotidine  and consider adjustments if necessary.   The patient was advised to call immediately if she has any concerning symptoms in the interval. The patient voices understanding of current disease status and treatment options and is in agreement with the current care plan.  All questions were answered. The patient knows to call the clinic with any problems, questions or concerns. We can certainly see the patient much sooner if necessary. The total time spent in the appointment was 20 minutes.  Disclaimer: This note was dictated with voice recognition software. Similar sounding words can inadvertently be transcribed and may not be corrected upon review.

## 2024-04-01 NOTE — Progress Notes (Signed)
 Nutrition Follow-up:  Patient with stage IV non-small cell lung cancer. She is currently receiving maintenance therapy with Alimta  + Avastin  q3w.   Met with patient in infusion. She reports decreased appetite the last couple of months. Endorses abdominal pain and vomiting with po of some foods. Recalls episode of vomiting after eating a few bites of an egg, couple pieces of bacon, and grits. She likes meat and potatoes. Has been unable to tolerate pork which is disappointing. Patient is followed by GI for this. Reports cutting out dairy the last 2 months. She has noticed some improvement, but lately it seems it does not matter what she eats. Salads typically work well for her. Patient gets nauseous with Ensure/Boost and no longer drinking these. Patient reports antiemetics work for the most part. Compazine  makes her sleepy, sometimes makes her very hungry. Patient unsure if she has anymore zofran . Patient states she stopped taking dex. She has an appointment with gynecologist tomorrow. Patient planning to ask for gallbladder to be checked. Both her mom and maternal grandmother are s/p cholecystectomy.     Medications: reviewed   Labs: reviewed   Anthropometrics: Wt 97 lb 12.8 oz today - trending down   4/2 - 98 lb 9.6 oz  3/12 - 102 lb 1.6 oz  2/19 - 100 lb 3.2 oz   NUTRITION DIAGNOSIS: Unintended wt loss - continues    INTERVENTION:  Educated on GI soft diet, recommend avoiding greasy/fried foods Consider checking lipase - ?EPI Pt is followed by outpt GI Encourage small frequent meals - snack ideas provided Educated on strategies for nausea, foods best tolerated - handout with tips and list of foods provided  Suggested taking nausea medications daily, alternating between compazine  and zofran  Educated on strategies for smell and taste changes - handout with tips provided  Contact information provided    MONITORING, EVALUATION, GOAL: wt trends, intake   NEXT VISIT: Wednesday May 14  during infusion

## 2024-04-02 ENCOUNTER — Other Ambulatory Visit: Payer: Self-pay

## 2024-04-06 ENCOUNTER — Ambulatory Visit: Payer: Commercial Managed Care - PPO | Admitting: Neurology

## 2024-04-06 ENCOUNTER — Encounter: Payer: Self-pay | Admitting: Neurology

## 2024-04-06 VITALS — BP 115/80 | HR 90 | Ht 60.0 in | Wt 98.0 lb

## 2024-04-06 DIAGNOSIS — M5417 Radiculopathy, lumbosacral region: Secondary | ICD-10-CM

## 2024-04-06 NOTE — Progress Notes (Signed)
 Follow-up Visit   Date: 04/06/2024    Kirsten Williams MRN: [id number] DOB: 02-23-1971    Kirsten Williams is a 53 y.o. right-handed Caucasian female with right lung adenocarcinoma, SLE, and GERD returning to the clinic for follow-up of lumbar radiculopathy.  The patient was accompanied to the clinic by self.  IMPRESSION/PLAN: Lumbar radiculopathy at L5 manifesting with bilateral feet paresthesias following L5 dermatome.  NCS/EMG of 08/2022 shows chronic right L5 radiculopathy, no evidence of large fiber neuropathy.  Exam shows diminished sensation over the dorsum of the feet with instant strength and reflexes. She had marked benefit with PT.   - Continue gabapentin 300mg  at bedtime  - Continue home exercises  - MRI lumbar spine can be ordered, if symptoms get worse  Return to clinic in 6 months  --------------------------------------------- History of present illness: Starting around 2022, she began having numbness/tingling over the dorsum of the feet and lower legs. Symptoms have intensified over time and progress as the day goes on. She takes gabapentin 300mg  twice daily and sometimes will take up to 6 times daily. She also takes hydrocodone  for right ankle pain. She had low back pain. She has not done PT.   UPDATE 09/30/2023: She is here for follow-up visit.  She has completed PT and reports having marked benefit with low back pain and hip pain.  Unfortunately, there was no significant change with her feet paresthesias.  She takes gabapentin 300mg  at bedtime, which gives minimal benefit. She is unable to tolerate higher dose because of cognitive side effects.  No new weakness or pain.   UPDATE 04/06/2024: She is here for follow-up visit.  She continues to have sand paper sensation over the top of the feet.  She has some relief with gabapentin 300mg  at bedtime and does not want to increase the dose.  She denies any worsening of symptoms.  She did not tolerate cymbalta  and went back  to gabapentin.    Medications:  Current Outpatient Medications on File Prior to Visit  Medication Sig Dispense Refill   amLODipine (NORVASC) 5 MG tablet Take 5 mg by mouth daily.     B Complex Vitamins (B COMPLEX 50 PO) Take by mouth.     Calcium Carbonate-Vit D-Min (CALCIUM 1200 PO) Take 1 capsule by mouth daily.     cholecalciferol (VITAMIN D3) 25 MCG (1000 UNIT) tablet Take 2,000 Units by mouth daily.     CLOBETASOL PROPIONATE E 0.05 % emollient cream Apply topically.     cyclobenzaprine (FLEXERIL) 10 MG tablet Take 10 mg by mouth at bedtime as needed for muscle spasms.     denosumab  (PROLIA ) 60 MG/ML SOSY injection Inject 60 mg into the skin every 6 (six) months.     dexamethasone  (DECADRON ) 4 MG tablet 4 mg p.o. twice daily the day before, day of and day after the chemotherapy every 3 weeks. 40 tablet 2   dicyclomine (BENTYL) 10 MG capsule Take 10 mg by mouth every 6 (six) hours as needed.     diphenhydrAMINE  (BENADRYL ) 25 MG tablet Take 25 mg by mouth daily as needed for allergies.     famotidine  (PEPCID ) 20 MG tablet Take 20 mg by mouth at bedtime.     fluticasone (FLONASE) 50 MCG/ACT nasal spray Place 2 sprays into both nostrils daily.     folic acid  (FOLVITE ) 1 MG tablet TAKE 1 TABLET(1 MG) BY MOUTH DAILY 30 tablet 4   gabapentin (NEURONTIN) 300 MG capsule Take 300 mg by mouth  3 (three) times daily. Patient takes once or twice a day.     hydrocortisone  1 % lotion Apply 1 application topically 2 (two) times daily. 118 mL 0   hydroxychloroquine (PLAQUENIL) 200 MG tablet Take 200 mg by mouth 2 (two) times daily.     ketoconazole (NIZORAL) 2 % shampoo SMARTSIG:Topical 2-3 Times Weekly     Multiple Vitamins-Minerals (MULTI ADULT GUMMIES) CHEW Chew 2 tablets by mouth daily.     omeprazole (PRILOSEC) 40 MG capsule Take 40 mg by mouth daily as needed (acid reflux).     oxyCODONE -acetaminophen  (PERCOCET/ROXICET) 5-325 MG tablet Take 1 tablet by mouth every 8 (eight) hours as needed for  severe pain (pain score 7-10). 30 tablet 0   potassium chloride  20 MEQ/15ML (10%) SOLN Take 15 mLs (20 mEq total) by mouth daily. 105 mL 0   Probiotic Product (PROBIOTIC-10 PO) Take by mouth.     prochlorperazine  (COMPAZINE ) 10 MG tablet TAKE 1 TABLET(10 MG) BY MOUTH EVERY 6 HOURS AS NEEDED 30 tablet 2   rizatriptan (MAXALT-MLT) 10 MG disintegrating tablet Take 10 mg by mouth 2 (two) times daily as needed.     tetrahydrozoline 0.05 % ophthalmic solution Place 1 drop into both eyes once. Systane Eye Drops once daily both eyes     triamcinolone ointment (KENALOG) 0.5 % Apply topically 3 (three) times daily.     No current facility-administered medications on file prior to visit.    Allergies:  Allergies  Allergen Reactions   Clindamycin/Lincomycin Itching and Nausea And Vomiting   Carboplatin  Itching and Other (See Comments)    Erythema to hands/feet/arms/perineal area  Elevated BP     Vital Signs:  BP 115/80   Pulse 90   Ht 5' (1.524 m)   Wt 98 lb (44.5 kg)   SpO2 100%   BMI 19.14 kg/m   Neurological Exam: MENTAL STATUS including orientation to time, place, person, recent and remote memory, attention span and concentration, language, and fund of knowledge is normal.  Speech is not dysarthric.  CRANIAL NERVES:  Pupils equal round and reactive to light.  Normal conjugate, extra-ocular eye movements in all directions of gaze.  No ptosis.  Face is symmetric.  MOTOR:  Motor strength is 5/5 in all extremities.  No atrophy, fasciculations or abnormal movements.  No pronator drift.  Tone is normal.    MSRs:  Reflexes are 2+/4 throughout.  SENSORY:  Intact to vibration throughout.  COORDINATION/GAIT:  Normal finger-to- nose-finger.  Intact rapid alternating movements bilaterally.  Gait narrow based and stable.   Data: NCS/EMG of the legs 08/14/2022: Chronic L5 radiculopathy affecting the right lower extremity, mild. There is no evidence of a large fiber sensorimotor polyneuropathy  affecting the lower extremities.    Thank you for allowing me to participate in patient's care.  If I can answer any additional questions, I would be pleased to do so.    Sincerely,    Cecily Lawhorne K. Lydia Sams, DO

## 2024-04-08 ENCOUNTER — Other Ambulatory Visit: Payer: Self-pay

## 2024-04-10 ENCOUNTER — Other Ambulatory Visit: Payer: Self-pay | Admitting: Internal Medicine

## 2024-04-10 ENCOUNTER — Ambulatory Visit (HOSPITAL_COMMUNITY)
Admission: RE | Admit: 2024-04-10 | Discharge: 2024-04-10 | Disposition: A | Discharge status: Home / Self Care | Source: Ambulatory Visit | Attending: Internal Medicine | Admitting: Internal Medicine

## 2024-04-10 DIAGNOSIS — C3490 Malignant neoplasm of unspecified part of unspecified bronchus or lung: Secondary | ICD-10-CM

## 2024-04-10 DIAGNOSIS — E876 Hypokalemia: Secondary | ICD-10-CM

## 2024-04-10 MED ORDER — IOHEXOL 9 MG/ML PO SOLN
500.0000 mL | ORAL | Status: AC
  Administered 2024-04-10 (×2): 500 mL via ORAL

## 2024-04-10 MED ORDER — SODIUM CHLORIDE (PF) 0.9 % IJ SOLN
INTRAMUSCULAR | Status: AC
  Filled 2024-04-10: qty 50

## 2024-04-10 MED ORDER — IOHEXOL 9 MG/ML PO SOLN
ORAL | Status: AC
  Filled 2024-04-10: qty 1000

## 2024-04-10 MED ORDER — IOHEXOL 300 MG/ML  SOLN
100.0000 mL | Freq: Once | INTRAMUSCULAR | Status: AC | PRN
  Administered 2024-04-10: 100 mL via INTRAVENOUS

## 2024-04-17 NOTE — Progress Notes (Unsigned)
 [facility] OFFICE PROGRESS NOTE  [name], PA-C [address] [place] [address]  DIAGNOSIS: Stage IV (T4, N2, M1 a) non-small cell lung cancer, adenocarcinoma presented with multifocal disease involving the right upper lobe, right lower lobe as well as suspicious lower paratracheal lymphadenopathy and groundglass nodules in the left lung diagnosed in May 2022. The patient had molecular studies performed by [facility] [employer] and that showed no detectable mutation but this is likely secondary to low-level circulating free tumor DNA   PRIOR THERAPY: None  CURRENT THERAPY:  Palliative systemic chemotherapy with carboplatin  for AUC of 5, Alimta  500 Mg/M2 and Avastin  15 Mg/KG every 3 weeks.  The patient is not a great candidate for immunotherapy in the event that she has a genetic mutation.  She is status post 48 cycles. Starting from cycle #6, the patient will start maintenance Alimta  and Avastin .    INTERVAL HISTORY: Kirsten Williams 53 y.o. female returns to the clinic today for a follow-up visit.  The patient was last seen 3 weeks ago by Dr. Marguerita Shih.  Overall the patient states she is feeling well today. She has some stress in her life with some care trouble which is causing some financial strain.  Her potassium is low today. She denies vomiting for changes in bowel habits. She does struggle with abdominal discomfort and bloating for which she sees GI. Her next appointment is tomorrow. She is on probiotics, prilosec, and bentyl.   She is currently on medication for blood pressure, which she takes every morning, but her blood pressure was noted to be high today.  She reports a decreased appetite and weight loss despite the fact she states "I eat". She did not have a lot to eat last night though. She also mentions nausea and a sensation of gagging while eating, which leads to a loss of appetite.   She mentions a history of bone density issues and reports that her recent bone  density scan showed improvement. She is on a regimen that includes a bone density shot scheduled for June 2nd.  She reports a foot issue that was evaluated by Dr. Tully Gainer, who recommended a cortisone shot. She experiences pain when the foot is bent a certain way.  She denies any fever or chills. She denies any changes in her breathing today with any worsening shortness of breath or cough. Consistently after treatment she does experience a bandlike station around her diaphragm which is unchanged. She describes sensations of palpitations a few times over the last few days. He had trouble with skin boils in the past but states this is good at this time.  She recently had a restaging CT scan performed. He is here today for evaluation and repeat blood work before undergoing cycle #49.     MEDICAL HISTORY: Past Medical History:  Diagnosis Date   Adenocarcinoma, lung, right (HCC) 05/03/2021   Kirsten Williams is a 53 y.o. female with a history of lung nodules dating back to 2019 who is referred in consultation with [name], PA-C for assessment and management. She is a former smoker having quit in 2016. 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 She missed her screening CT last year and imaging was ordered last month. CT chest from 02-16-2021 reveals pro   Anxiety    GERD (gastroesophageal reflux disease)    SLE (systemic lupus erythematosus) (HCC) 05/03/2021   SLE (systemic lupus erythematosus) (HCC) 05/03/2021    ALLERGIES:  is allergic to clindamycin/lincomycin and carboplatin .  MEDICATIONS:  Current Outpatient Medications  Medication Sig Dispense Refill   amLODipine (NORVASC) 5 MG tablet Take 5 mg by mouth daily.     B Complex Vitamins (B COMPLEX 50 PO) Take by mouth.     Calcium Carbonate-Vit D-Min (CALCIUM 1200 PO) Take 1 capsule by mouth daily.     cholecalciferol (VITAMIN D3) 25 MCG (1000 UNIT) tablet Take 2,000 Units by mouth daily.     CLOBETASOL  PROPIONATE E 0.05 % emollient cream Apply topically.     cyclobenzaprine (FLEXERIL) 10 MG tablet Take 10 mg by mouth at bedtime as needed for muscle spasms.     denosumab  (PROLIA ) 60 MG/ML SOSY injection Inject 60 mg into the skin every 6 (six) months.     dexamethasone  (DECADRON ) 4 MG tablet 4 mg p.o. twice daily the day before, day of and day after the chemotherapy every 3 weeks. 40 tablet 2   dicyclomine (BENTYL) 10 MG capsule Take 10 mg by mouth every 6 (six) hours as needed.     diphenhydrAMINE  (BENADRYL ) 25 MG tablet Take 25 mg by mouth daily as needed for allergies.     famotidine  (PEPCID ) 20 MG tablet Take 20 mg by mouth at bedtime.     fluticasone (FLONASE) 50 MCG/ACT nasal spray Place 2 sprays into both nostrils daily.     folic acid  (FOLVITE ) 1 MG tablet TAKE 1 TABLET(1 MG) BY MOUTH DAILY 30 tablet 4   gabapentin (NEURONTIN) 300 MG capsule Take 300 mg by mouth 3 (three) times daily. Patient takes once or twice a day.     hydrocortisone  1 % lotion Apply 1 application topically 2 (two) times daily. 118 mL 0   hydroxychloroquine (PLAQUENIL) 200 MG tablet Take 200 mg by mouth 2 (two) times daily.     ketoconazole (NIZORAL) 2 % shampoo SMARTSIG:Topical 2-3 Times Weekly     Multiple Vitamins-Minerals (MULTI ADULT GUMMIES) CHEW Chew 2 tablets by mouth daily.     omeprazole (PRILOSEC) 40 MG capsule Take 40 mg by mouth daily as needed (acid reflux).     oxyCODONE -acetaminophen  (PERCOCET/ROXICET) 5-325 MG tablet Take 1 tablet by mouth every 8 (eight) hours as needed for severe pain (pain score 7-10). 30 tablet 0   potassium chloride  20 MEQ/15ML (10%) SOLN Take 15 mLs (20 mEq total) by mouth daily. 105 mL 0   Probiotic Product (PROBIOTIC-10 PO) Take by mouth.     prochlorperazine  (COMPAZINE ) 10 MG tablet TAKE 1 TABLET(10 MG) BY MOUTH EVERY 6 HOURS AS NEEDED 30 tablet 2   rizatriptan (MAXALT-MLT) 10 MG disintegrating tablet Take 10 mg by mouth 2 (two) times daily as needed.     tetrahydrozoline  0.05 % ophthalmic solution Place 1 drop into both eyes once. Systane Eye Drops once daily both eyes     triamcinolone ointment (KENALOG) 0.5 % Apply topically 3 (three) times daily.     No current facility-administered medications for this visit.    SURGICAL HISTORY:  Past Surgical History:  Procedure Laterality Date   ABDOMINAL HYSTERECTOMY     BRONCHIAL BIOPSY  04/24/2021   Procedure: BRONCHIAL BIOPSIES;  Surgeon: Denson Flake, MD;  Location: [facility] ENDOSCOPY;  Service: Pulmonary;;   BRONCHIAL BRUSHINGS  04/24/2021   Procedure: BRONCHIAL BRUSHINGS;  Surgeon: Denson Flake, MD;  Location: [facility] ENDOSCOPY;  Service: Pulmonary;;   BRONCHIAL NEEDLE ASPIRATION BIOPSY  04/24/2021   Procedure: BRONCHIAL NEEDLE ASPIRATION BIOPSIES;  Surgeon: Denson Flake, MD;  Location: [facility]  ENDOSCOPY;  Service: Pulmonary;;   BRONCHIAL WASHINGS  04/24/2021   Procedure: BRONCHIAL WASHINGS;  Surgeon: Denson Flake, MD;  Location: [facility] ENDOSCOPY;  Service: Pulmonary;;   CESAREAN SECTION  11/09/1990   DILATION AND CURETTAGE OF UTERUS Bilateral 09/09/1996   FIDUCIAL MARKER PLACEMENT  04/24/2021   Procedure: FIDUCIAL MARKER PLACEMENT;  Surgeon: Denson Flake, MD;  Location: [facility] ENDOSCOPY;  Service: Pulmonary;;   TONSILLECTOMY  12/10/1977   VIDEO BRONCHOSCOPY WITH ENDOBRONCHIAL NAVIGATION Bilateral 04/24/2021   Procedure: VIDEO BRONCHOSCOPY WITH ENDOBRONCHIAL NAVIGATION;  Surgeon: Denson Flake, MD;  Location: [facility] ENDOSCOPY;  Service: Pulmonary;  Laterality: Bilateral;    REVIEW OF SYSTEMS:   Review of Systems  Constitutional: Positive for stable fatigue and weight loss. Negative for appetite change, chills,  and fever.  HENT: Negative for mouth sores, nosebleeds, sore throat and trouble swallowing.   Eyes: Negative for eye problems and icterus.  Respiratory: Negative for cough, hemoptysis, shortness of breath and wheezing.   Cardiovascular: Negative for chest pain (gets chest tightness routinely after treatment but  none at this time) and leg swelling.  Gastrointestinal: Positive for intermittent chronic abdominal bloating, nausea, and early satiety. Negative for constipation, diarrhea, and vomiting.  Genitourinary: Negative for bladder incontinence, difficulty urinating, dysuria, frequency and hematuria.   Musculoskeletal: Positive for right ankle pain. Negative for back pain, gait problem, neck pain and neck stiffness.  Skin: Negative for itching and rash.  Neurological: Negative for dizziness, extremity weakness, gait problem, headaches, and seizures.  Hematological: Negative for adenopathy. Does not bruise/bleed easily.  Psychiatric/Behavioral: Negative for confusion, depression and sleep disturbance. The patient is not nervous/anxious.     PHYSICAL EXAMINATION:  There were no vitals taken for this visit.  ECOG PERFORMANCE STATUS: 1  Physical Exam  Constitutional: Oriented to person, place, and time and thin appearing female, and in no distress.  HENT:  Head: Normocephalic and atraumatic.  Mouth/Throat: Oropharynx is clear and moist. No oropharyngeal exudate.  Eyes: Conjunctivae are normal. Right eye exhibits no discharge. Left eye exhibits no discharge. No scleral icterus.  Neck: Normal range of motion. Neck supple.  Cardiovascular: Normal rate, regular rhythm, normal heart sounds and intact distal pulses.   Pulmonary/Chest: Effort normal and breath sounds normal. No respiratory distress. No wheezes. No rales.  Abdominal: Soft. Bowel sounds are normal. Exhibits no distension and no mass. There is no tenderness.  Musculoskeletal: Normal range of motion. Exhibits no edema.  Lymphadenopathy:    No cervical adenopathy.  Neurological: Alert and oriented to person, place, and time. Exhibits normal muscle tone. Gait normal. Coordination normal.  Skin: Skin is warm and dry .Positive for rash on upper chest and back.  Psychiatric: Mood, memory and judgment normal.  Vitals reviewed.  LABORATORY  DATA: Lab Results  Component Value Date   WBC 6.9 04/01/2024   HGB 11.1 (L) 04/01/2024   HCT 33.8 (L) 04/01/2024   MCV 92.3 04/01/2024   PLT 276 04/01/2024      Chemistry      Component Value Date/Time   NA 141 04/01/2024 1050   NA 140 03/13/2021 0000   K 2.8 (L) 04/01/2024 1050   CL 105 04/01/2024 1050   CO2 25 04/01/2024 1050   BUN 6 04/01/2024 1050   BUN 10 03/13/2021 0000   CREATININE 0.83 04/01/2024 1050   GLU 107 03/13/2021 0000      Component Value Date/Time   CALCIUM 8.6 (L) 04/01/2024 1050   ALKPHOS 100 04/01/2024 1050   AST 19 04/01/2024  1050   ALT 9 04/01/2024 1050   BILITOT 0.3 04/01/2024 1050       RADIOGRAPHIC STUDIES:  CT CHEST ABDOMEN PELVIS W CONTRAST Result Date: 04/10/2024 CLINICAL DATA:  Lung cancer restaging, ongoing chemotherapy. * Tracking Code: BO * EXAM: CT CHEST, ABDOMEN, AND PELVIS WITH CONTRAST TECHNIQUE: Multidetector CT imaging of the chest, abdomen and pelvis was performed following the standard protocol during bolus administration of intravenous contrast. RADIATION DOSE REDUCTION: This exam was performed according to the departmental dose-optimization program which includes automated exposure control, adjustment of the mA and/or kV according to patient size and/or use of iterative reconstruction technique. CONTRAST:  OMNIPAQUE  IOHEXOL  300 MG/ML  SOLN COMPARISON:  Multiple priors including most recent CT January 23, 2024 FINDINGS: CT CHEST FINDINGS Cardiovascular: Aortic atherosclerosis. Normal size heart. No significant pericardial effusion/thickening. Mediastinum/Nodes: No suspicious thyroid  nodule. Stable size of the enlarged mediastinal lymph nodes. Previously index pretracheal lymph node measures 18 x 11 mm on image 25/2 previously 18 x 12 mm. Lungs/Pleura: Unchanged size of the dominant sub solid spiculated nodule in the superior segment of the right lower lobe measuring 2.3 x 1.5 cm on image 68/6. Additional sub solid and ground-glass  nodules are stable from prior. For reference: -peripheral left upper lobe subsolid opacity measures 11 mm on image 43/6, unchanged. Stable 3 mm solid nodule in the anterior inferior right upper lobe on image 58/6. No new suspicious pulmonary nodules or masses. Multiple metallic biopsy clips and fiducial markers. Musculoskeletal: No aggressive lytic or blastic lesion of bone. CT ABDOMEN PELVIS FINDINGS Hepatobiliary: Diffuse hepatic steatosis. No suspicious hepatic lesion. Gallbladder is unremarkable. No biliary ductal dilation. Pancreas: No pancreatic ductal dilation or evidence of acute inflammation. Spleen: No splenomegaly. Adrenals/Urinary Tract: Bilateral adrenal nodules previously characterized as adenomas are stable. Stable right pelviectasis. Kidneys demonstrate symmetric enhancement. Urinary bladder is unremarkable for degree of distension. Stomach/Bowel: Stomach is within normal limits. Appendix not clearly visualized. No evidence of bowel wall thickening, distention, or inflammatory changes. Vascular/Lymphatic: Aortic atherosclerosis. Normal caliber abdominal aorta. Smooth IVC contours. No pathologically enlarged abdominal or pelvic lymph nodes. Reproductive: Status post hysterectomy. No adnexal masses. Other: No significant abdominopelvic free fluid. Musculoskeletal: No aggressive lytic or blastic lesion of bone. IMPRESSION: 1. Stable size of the dominant sub solid spiculated nodule in the superior segment of the right lower lobe. 2. Additional sub solid and ground-glass nodules are stable from prior. 3. Stable size of the enlarged mediastinal lymph nodes. 4. No evidence of metastatic disease in the abdomen or pelvis. 5. Diffuse hepatic steatosis. Electronically Signed   By: Tama Fails M.D.   On: 04/10/2024 17:15     ASSESSMENT/PLAN:  This is a very pleasant 53 year old Caucasian female diagnosed with stage IV (T4, N2, M1 a) non-small cell lung cancer, adenocarcinoma presented with multifocal  disease involving the right upper lobe, right lower lobe as well as suspicious lower paratracheal lymphadenopathy and groundglass nodules in the left lung diagnosed in May 2022. The patient had molecular studies performed by [facility] and that showed no detectable mutation but this is likely secondary to low-level circulating free tumor DNA. If the patient has disease progression in the future, then we will likely retest her for molecular studies.     The patient is currently undergoing systemic chemotherapy with carboplatin  for an AUC 5, Alimta  500 mg per metered square, and and Avastin  15 mg/kg IV every 3 weeks.  She is status post 48 cycles.  Starting from cycle #7, the patient has  been on maintenance treatment with Alimta  and Avastin .  The patient was seen with Dr. Marguerita Shih today.  Dr. Marguerita Shih personally and independently reviewed the scan and discussed results with the patient today.  The scan showed no evidence of disease progression.  Dr. Marguerita Shih recommends she continue on treatment at the same dose.   We will hold her avastin  today due to her urine protein of 300 today.   We will recheck her BP in the infusion room. She took her anti-hypertensive today but she was a little stressed due to some financial cost/concerns.   Her potassium is low. She will receive 30 meq of IV potassium today. I have also sent her oral potassium to take at home BID for 10 days.   Labs were reviewed . Recommend she proceed with cycle 49 today as scheduled with single agent alimta .   We will see her back for labs and follow up in 3 weeks for evaluation before undergoing cycle #50.   The patient was advised to call immediately if she has any concerning symptoms in the interval. The patient voices understanding of current disease status and treatment options and is in agreement with the current care plan. All questions were answered. The patient knows to call the clinic with any problems, questions or concerns.  We can certainly see the patient much sooner if necessary   No orders of the defined types were placed in this encounter.   Leiyah Maultsby L Phyillis Dascoli, PA-C 04/17/24  ADDENDUM: Hematology/Oncology Attending: I had a face-to-face encounter with the patient today.  I reviewed her records, lab, scan and recommended her care plan.  This is a very pleasant 53 years old white female with a stage IV non-small cell lung cancer, adenocarcinoma diagnosed in May 2022.  The patient has no molecular actionable mutations.  She was also not a good candidate for treatment with immunotherapy because of systemic lupus erythematosus.  She started treatment with carboplatin , Alimta  and Avastin  for 6 cycles and starting from cycle #7 she has been on maintenance treatment with Alimta  and Avastin  every 3 weeks status post 48 cycles.  She has been tolerating her treatment fairly well with no concerning adverse effect except for mild fatigue.  She had repeat CT scan of the chest, abdomen and pelvis performed recently.  I personally and independently reviewed the scan and discussed the result with the patient today.  Her scan showed no concerning findings for disease progression. I recommended for her to continue her current treatment with the same regimen but we will skip her treatment with Avastin  today because of the elevated urine protein.  For the electrolyte abnormality, will arrange for the patient to receive potassium chloride  during her treatment today. The patient will come back for follow-up visit in 3 weeks for evaluation before starting cycle #50. She was advised to call immediately if she has any other concerning symptoms in the interval. The total time spent in the appointment was 30 minutes including review of chart and various tests results, discussions about plan of care and coordination of care plan . Disclaimer: This note was dictated with voice recognition software. Similar sounding words can inadvertently  be transcribed and may be missed upon review. Aurelio Blower, MD

## 2024-04-22 ENCOUNTER — Inpatient Hospital Stay

## 2024-04-22 ENCOUNTER — Inpatient Hospital Stay: Attending: Hematology and Oncology

## 2024-04-22 ENCOUNTER — Inpatient Hospital Stay: Admitting: Dietician

## 2024-04-22 ENCOUNTER — Inpatient Hospital Stay: Attending: Hematology and Oncology | Admitting: Physician Assistant

## 2024-04-22 VITALS — BP 172/89 | HR 107 | Temp 97.8°F | Resp 16 | Wt 95.7 lb

## 2024-04-22 VITALS — HR 93

## 2024-04-22 DIAGNOSIS — C3411 Malignant neoplasm of upper lobe, right bronchus or lung: Secondary | ICD-10-CM | POA: Insufficient documentation

## 2024-04-22 DIAGNOSIS — E876 Hypokalemia: Secondary | ICD-10-CM | POA: Diagnosis not present

## 2024-04-22 DIAGNOSIS — C3431 Malignant neoplasm of lower lobe, right bronchus or lung: Secondary | ICD-10-CM | POA: Insufficient documentation

## 2024-04-22 DIAGNOSIS — Z5111 Encounter for antineoplastic chemotherapy: Secondary | ICD-10-CM | POA: Diagnosis not present

## 2024-04-22 DIAGNOSIS — C3491 Malignant neoplasm of unspecified part of right bronchus or lung: Secondary | ICD-10-CM

## 2024-04-22 DIAGNOSIS — M329 Systemic lupus erythematosus, unspecified: Secondary | ICD-10-CM | POA: Diagnosis not present

## 2024-04-22 DIAGNOSIS — E878 Other disorders of electrolyte and fluid balance, not elsewhere classified: Secondary | ICD-10-CM | POA: Insufficient documentation

## 2024-04-22 HISTORY — DX: Hypokalemia: E87.6

## 2024-04-22 LAB — CMP (CANCER CENTER ONLY)
ALT: 13 U/L (ref 0–44)
AST: 25 U/L (ref 15–41)
Albumin: 4.2 g/dL (ref 3.5–5.0)
Alkaline Phosphatase: 131 U/L — ABNORMAL HIGH (ref 38–126)
Anion gap: 13 (ref 5–15)
BUN: 7 mg/dL (ref 6–20)
CO2: 28 mmol/L (ref 22–32)
Calcium: 9.4 mg/dL (ref 8.9–10.3)
Chloride: 101 mmol/L (ref 98–111)
Creatinine: 0.98 mg/dL (ref 0.44–1.00)
GFR, Estimated: >60 mL/min (ref >60)
Glucose, Bld: 115 mg/dL — ABNORMAL HIGH (ref 70–99)
Potassium: 2.6 mmol/L — CL (ref 3.5–5.1)
Sodium: 142 mmol/L (ref 135–145)
Total Bilirubin: 0.5 mg/dL (ref 0.0–1.2)
Total Protein: 8.2 g/dL — ABNORMAL HIGH (ref 6.5–8.1)

## 2024-04-22 LAB — CBC WITH DIFFERENTIAL (CANCER CENTER ONLY)
Abs Immature Granulocytes: 0.02 10*3/uL (ref 0.00–0.07)
Basophils Absolute: 0.1 10*3/uL (ref 0.0–0.1)
Basophils Relative: 1 %
Eosinophils Absolute: 0.1 10*3/uL (ref 0.0–0.5)
Eosinophils Relative: 1 %
HCT: 40.7 % (ref 36.0–46.0)
Hemoglobin: 12.9 g/dL (ref 12.0–15.0)
Immature Granulocytes: 0 %
Lymphocytes Relative: 26 %
Lymphs Abs: 2.2 10*3/uL (ref 0.7–4.0)
MCH: 29.9 pg (ref 26.0–34.0)
MCHC: 31.7 g/dL (ref 30.0–36.0)
MCV: 94.4 fL (ref 80.0–100.0)
Monocytes Absolute: 1.1 10*3/uL — ABNORMAL HIGH (ref 0.1–1.0)
Monocytes Relative: 13 %
Neutro Abs: 4.9 10*3/uL (ref 1.7–7.7)
Neutrophils Relative %: 59 %
Platelet Count: 397 10*3/uL (ref 150–400)
RBC: 4.31 MIL/uL (ref 3.87–5.11)
RDW: 18.4 % — ABNORMAL HIGH (ref 11.5–15.5)
WBC Count: 8.4 10*3/uL (ref 4.0–10.5)
nRBC: 0 % (ref 0.0–0.2)

## 2024-04-22 LAB — TOTAL PROTEIN, URINE DIPSTICK: Protein, ur: 300 mg/dL — AB

## 2024-04-22 MED ORDER — DEXAMETHASONE SODIUM PHOSPHATE 10 MG/ML IJ SOLN
10.0000 mg | Freq: Once | INTRAMUSCULAR | Status: AC
  Administered 2024-04-22: 10 mg via INTRAVENOUS
  Filled 2024-04-22: qty 1

## 2024-04-22 MED ORDER — PEMETREXED DISODIUM CHEMO 500 MG/20ML IV SOLN
500.0000 mg/m2 | Freq: Once | INTRAVENOUS | Status: AC
  Administered 2024-04-22: 700 mg via INTRAVENOUS
  Filled 2024-04-22: qty 20

## 2024-04-22 MED ORDER — SODIUM CHLORIDE 0.9 % IV SOLN: Freq: Once | INTRAVENOUS | Status: AC

## 2024-04-22 MED ORDER — PROCHLORPERAZINE MALEATE 10 MG PO TABS
10.0000 mg | ORAL_TABLET | Freq: Once | ORAL | Status: AC
  Administered 2024-04-22: 10 mg via ORAL
  Filled 2024-04-22: qty 1

## 2024-04-22 MED ORDER — POTASSIUM CHLORIDE 10 MEQ/100ML IV SOLN
10.0000 meq | INTRAVENOUS | Status: AC
  Administered 2024-04-22 (×3): 10 meq via INTRAVENOUS
  Filled 2024-04-22 (×3): qty 100

## 2024-04-22 MED ORDER — POTASSIUM CHLORIDE CRYS ER 20 MEQ PO TBCR
20.0000 meq | EXTENDED_RELEASE_TABLET | Freq: Two times a day (BID) | ORAL | 0 refills | Status: DC
Start: 2024-04-22 — End: 2024-08-05

## 2024-04-22 NOTE — Progress Notes (Signed)
 CRITICAL VALUE STICKER  CRITICAL VALUE: K+ =2.6  RECEIVER (on-site recipient of call):Sharrod Achille  DATE & TIME NOTIFIED: 04/22/2024 @ 1118  MESSENGER (representative from lab): AMBER  MD NOTIFIED: Laural Polka , PA-C  TIME OF NOTIFICATION: 1118  RESPONSE:  Kirsten Williams is being seen by provider.

## 2024-04-22 NOTE — Progress Notes (Signed)
 Nutrition Follow-up:  Patient with stage IV non-small cell lung cancer. She is currently receiving maintenance therapy with Alimta  + Avastin  q3w.   Met with patient in infusion. She reports increasing intake and surprised that she lost weight. Patient has persistent nausea. Antiemetics are working well. She reports improved taste with baking soda salt water rinses. Patient states she is on the go all the time and needs to start making smoothies.    Medications: reviewed  Labs: K 2.6, glucose 115  Anthropometrics: Wt 95 lb 11.2 oz today   4/23 - 97 lb 12.8 oz  3/12 - 102 lb 1.6 oz    NUTRITION DIAGNOSIS: Unintended wt loss - continues     INTERVENTION:  Encourage packing snack bag with foods high in calories/protein Suggested making smoothies ahead of time for a grab and go breakfast Continue antiemetics     MONITORING, EVALUATION, GOAL: wt trends, intake   NEXT VISIT: Wednesday June 25 during infusion

## 2024-04-30 ENCOUNTER — Other Ambulatory Visit: Payer: Self-pay | Admitting: Physician Assistant

## 2024-04-30 DIAGNOSIS — E876 Hypokalemia: Secondary | ICD-10-CM

## 2024-05-01 ENCOUNTER — Encounter: Payer: Self-pay | Admitting: Internal Medicine

## 2024-05-01 ENCOUNTER — Encounter: Payer: Self-pay | Admitting: Sports Medicine

## 2024-05-12 ENCOUNTER — Ambulatory Visit (INDEPENDENT_AMBULATORY_CARE_PROVIDER_SITE_OTHER): Payer: Commercial Managed Care - PPO

## 2024-05-12 ENCOUNTER — Telehealth: Payer: Self-pay

## 2024-05-12 VITALS — BP 132/86 | HR 93 | Temp 97.8°F | Resp 16 | Ht 61.0 in | Wt 93.0 lb

## 2024-05-12 DIAGNOSIS — M810 Age-related osteoporosis without current pathological fracture: Secondary | ICD-10-CM | POA: Diagnosis not present

## 2024-05-12 MED ORDER — DENOSUMAB 60 MG/ML ~~LOC~~ SOSY
60.0000 mg | PREFILLED_SYRINGE | Freq: Once | SUBCUTANEOUS | Status: AC
  Administered 2024-05-12: 60 mg via SUBCUTANEOUS
  Filled 2024-05-12: qty 1

## 2024-05-12 NOTE — Telephone Encounter (Signed)
 Auth Submission: NO AUTH NEEDED Site of care: Site of care: [facility] Payer: [employer] Medication & CPT/J Code(s) submitted: Prolia  (Denosumab ) [id number] Route of submission (phone, fax, portal): portal/phone Phone # [phone] Fax # Auth type: Buy/Bill PB Units/visits requested: 60mg  x 2 doses Reference number: Kirsten Williams Approval from: 05/12/24 to 12/09/24   Both [employer] and [employer] portals told me no Kirsten Williams is required. I called the insurance and Kirsten Williams confirmed that no Kirsten Williams is required.  The patient is here now and states she received a letter that her Prolia  was approved. She will send us  that letter.

## 2024-05-12 NOTE — Progress Notes (Signed)
 Diagnosis: Osteoporosis  Provider:  Chilton Greathouse MD  Procedure: Injection  Prolia (Denosumab), Dose: 60 mg, Site: subcutaneous, Number of injections: 1  Injection Site(s): Right arm  Post Care: Patient declined observation  Discharge: Condition: Stable, Destination: Home . AVS Provided  Performed by:  Wyvonne Lenz, RN

## 2024-05-13 ENCOUNTER — Inpatient Hospital Stay: Admitting: Internal Medicine

## 2024-05-13 ENCOUNTER — Inpatient Hospital Stay

## 2024-05-13 ENCOUNTER — Other Ambulatory Visit: Payer: Self-pay

## 2024-05-13 ENCOUNTER — Inpatient Hospital Stay: Attending: Hematology and Oncology

## 2024-05-13 VITALS — BP 130/73 | HR 102 | Temp 97.7°F | Resp 18 | Ht 61.0 in

## 2024-05-13 VITALS — HR 100

## 2024-05-13 DIAGNOSIS — Z5111 Encounter for antineoplastic chemotherapy: Secondary | ICD-10-CM | POA: Diagnosis present

## 2024-05-13 DIAGNOSIS — C3491 Malignant neoplasm of unspecified part of right bronchus or lung: Secondary | ICD-10-CM | POA: Diagnosis not present

## 2024-05-13 DIAGNOSIS — C3411 Malignant neoplasm of upper lobe, right bronchus or lung: Secondary | ICD-10-CM | POA: Diagnosis present

## 2024-05-13 DIAGNOSIS — M329 Systemic lupus erythematosus, unspecified: Secondary | ICD-10-CM | POA: Insufficient documentation

## 2024-05-13 DIAGNOSIS — Z5112 Encounter for antineoplastic immunotherapy: Secondary | ICD-10-CM | POA: Diagnosis present

## 2024-05-13 LAB — CBC WITH DIFFERENTIAL (CANCER CENTER ONLY)
Abs Immature Granulocytes: 0.04 10*3/uL (ref 0.00–0.07)
Basophils Absolute: 0.1 10*3/uL (ref 0.0–0.1)
Basophils Relative: 1 %
Eosinophils Absolute: 0.1 10*3/uL (ref 0.0–0.5)
Eosinophils Relative: 1 %
HCT: 36.1 % (ref 36.0–46.0)
Hemoglobin: 11.8 g/dL — ABNORMAL LOW (ref 12.0–15.0)
Immature Granulocytes: 1 %
Lymphocytes Relative: 26 %
Lymphs Abs: 1.9 10*3/uL (ref 0.7–4.0)
MCH: 30.6 pg (ref 26.0–34.0)
MCHC: 32.7 g/dL (ref 30.0–36.0)
MCV: 93.8 fL (ref 80.0–100.0)
Monocytes Absolute: 0.9 10*3/uL (ref 0.1–1.0)
Monocytes Relative: 12 %
Neutro Abs: 4.4 10*3/uL (ref 1.7–7.7)
Neutrophils Relative %: 59 %
Platelet Count: 372 10*3/uL (ref 150–400)
RBC: 3.85 MIL/uL — ABNORMAL LOW (ref 3.87–5.11)
RDW: 17.9 % — ABNORMAL HIGH (ref 11.5–15.5)
WBC Count: 7.3 10*3/uL (ref 4.0–10.5)
nRBC: 0 % (ref 0.0–0.2)

## 2024-05-13 LAB — CMP (CANCER CENTER ONLY)
ALT: 12 U/L (ref 0–44)
AST: 21 U/L (ref 15–41)
Albumin: 4 g/dL (ref 3.5–5.0)
Alkaline Phosphatase: 121 U/L (ref 38–126)
Anion gap: 10 (ref 5–15)
BUN: 15 mg/dL (ref 6–20)
CO2: 26 mmol/L (ref 22–32)
Calcium: 9.2 mg/dL (ref 8.9–10.3)
Chloride: 101 mmol/L (ref 98–111)
Creatinine: 0.94 mg/dL (ref 0.44–1.00)
GFR, Estimated: >60 mL/min (ref >60)
Glucose, Bld: 157 mg/dL — ABNORMAL HIGH (ref 70–99)
Potassium: 3.8 mmol/L (ref 3.5–5.1)
Sodium: 137 mmol/L (ref 135–145)
Total Bilirubin: 0.3 mg/dL (ref 0.0–1.2)
Total Protein: 8.1 g/dL (ref 6.5–8.1)

## 2024-05-13 LAB — TOTAL PROTEIN, URINE DIPSTICK: Protein, ur: 100 mg/dL — AB

## 2024-05-13 MED ORDER — DEXAMETHASONE SODIUM PHOSPHATE 10 MG/ML IJ SOLN
10.0000 mg | Freq: Once | INTRAMUSCULAR | Status: AC
  Administered 2024-05-13: 10 mg via INTRAVENOUS
  Filled 2024-05-13: qty 1

## 2024-05-13 MED ORDER — SODIUM CHLORIDE 0.9 % IV SOLN
500.0000 mg/m2 | Freq: Once | INTRAVENOUS | Status: AC
  Administered 2024-05-13: 700 mg via INTRAVENOUS
  Filled 2024-05-13: qty 20

## 2024-05-13 MED ORDER — SODIUM CHLORIDE 0.9 % IV SOLN
700.0000 mg | Freq: Once | INTRAVENOUS | Status: AC
  Administered 2024-05-13: 700 mg via INTRAVENOUS
  Filled 2024-05-13: qty 12

## 2024-05-13 MED ORDER — PROCHLORPERAZINE MALEATE 10 MG PO TABS
10.0000 mg | ORAL_TABLET | Freq: Once | ORAL | Status: AC
  Administered 2024-05-13: 10 mg via ORAL
  Filled 2024-05-13: qty 1

## 2024-05-13 MED ORDER — SODIUM CHLORIDE 0.9 % IV SOLN: Freq: Once | INTRAVENOUS | Status: AC

## 2024-05-13 MED ORDER — CYANOCOBALAMIN 1000 MCG/ML IJ SOLN
1000.0000 ug | Freq: Once | INTRAMUSCULAR | Status: AC
  Administered 2024-05-13: 1000 ug via INTRAMUSCULAR
  Filled 2024-05-13: qty 1

## 2024-05-13 NOTE — Progress Notes (Signed)
 [facility] [phone]   [phone]  OFFICE PROGRESS NOTE  Kirsten Arnold, PA-C [address] [place] [address]  DIAGNOSIS:  Stage IV (T4, N2, M1 a) non-small cell lung cancer, adenocarcinoma presented with multifocal disease involving the right upper lobe, right lower lobe as well as suspicious lower paratracheal lymphadenopathy and groundglass nodules in the left lung diagnosed in May 2022. The patient had molecular studies performed by [facility] [employer] and that showed no detectable mutation but this is likely secondary to low-level circulating free tumor DNA.  PRIOR THERAPY: None.  CURRENT THERAPY: palliative systemic chemotherapy with carboplatin  for AUC of 5, Alimta  500 Mg/M2 and Avastin  15 Mg/KG every 3 weeks.  The patient is not a great candidate for immunotherapy because of her history of active systemic lupus erythematosus.  Starting from cycle #7 the patient is on maintenance treatment with Alimta  and Avastin  every 3 weeks.  She is status post 49 cycles.  INTERVAL HISTORY: ERSEL WADLEIGH 53 y.o. female returns to the clinic today for follow-up visit.  Discussed the use of AI scribe software for clinical note transcription with the patient, who gave verbal consent to proceed.  History of Present Illness   Kirsten Williams is a 53 year old female with stage four non-small cell lung cancer, adenocarcinoma, who presents for cycle number fifty of maintenance chemotherapy.  Diagnosed with stage four non-small cell lung cancer, adenocarcinoma, in May 2022, she completed six cycles of systemic chemotherapy with carboplatin , Alimta , and Avastin . She has since been on maintenance therapy with Alimta  and Avastin  every three weeks, currently here for cycle number fifty. She feels better with no major complaints.  She is undergoing a gallbladder evaluation, including an ultrasound and a scheduled HIDA scan at [facility]. The scan is expected to  take about two and a half hours, and she is scheduled to be there at 7:30 AM. She is uncertain about the findings of the ultrasound and upcoming scan.  Over the past month, she has experienced 'crazy little fluttering' in her chest. Further evaluation, such as an EKG or heart monitor, might be necessary if the symptoms continue.  She recently traveled to [place]  to attend her niece's graduation, where she gained some weight due to her sister's cooking, which included banana pudding. Her potassium levels are currently good.        MEDICAL HISTORY: Past Medical History:  Diagnosis Date   Adenocarcinoma, lung, right (HCC) 05/03/2021   Kirsten Williams is a 53 y.o. female with a history of lung nodules dating back to 2019 who is referred in consultation with Kirsten Arnold, PA-C for assessment and management. She is a former smoker having quit in 2016. 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 She missed her screening CT last year and imaging was ordered last month. CT chest from 02-16-2021 reveals pro   Anxiety    GERD (gastroesophageal reflux disease)    SLE (systemic lupus erythematosus) (HCC) 05/03/2021   SLE (systemic lupus erythematosus) (HCC) 05/03/2021    ALLERGIES:  is allergic to clindamycin/lincomycin and carboplatin .  MEDICATIONS:  Current Outpatient Medications  Medication Sig Dispense Refill   amLODipine (NORVASC) 5 MG tablet Take 5 mg by mouth daily.     B Complex Vitamins (B COMPLEX 50 PO) Take by mouth.     Calcium Carbonate-Vit D-Min (CALCIUM 1200 PO) Take 1 capsule by mouth daily.     cholecalciferol (VITAMIN D3)  25 MCG (1000 UNIT) tablet Take 2,000 Units by mouth daily.     CLOBETASOL PROPIONATE E 0.05 % emollient cream Apply topically.     cyclobenzaprine (FLEXERIL) 10 MG tablet Take 10 mg by mouth at bedtime as needed for muscle spasms.     denosumab  (PROLIA ) 60 MG/ML SOSY injection Inject 60 mg into the skin every 6 (six) months.      dexamethasone  (DECADRON ) 4 MG tablet 4 mg p.o. twice daily the day before, day of and day after the chemotherapy every 3 weeks. 40 tablet 2   dicyclomine (BENTYL) 10 MG capsule Take 10 mg by mouth every 6 (six) hours as needed.     diphenhydrAMINE  (BENADRYL ) 25 MG tablet Take 25 mg by mouth daily as needed for allergies.     famotidine  (PEPCID ) 20 MG tablet Take 20 mg by mouth at bedtime.     fluticasone (FLONASE) 50 MCG/ACT nasal spray Place 2 sprays into both nostrils daily.     folic acid  (FOLVITE ) 1 MG tablet TAKE 1 TABLET(1 MG) BY MOUTH DAILY 30 tablet 4   gabapentin (NEURONTIN) 300 MG capsule Take 300 mg by mouth 3 (three) times daily. Patient takes once or twice a day.     hydrocortisone  1 % lotion Apply 1 application topically 2 (two) times daily. 118 mL 0   hydroxychloroquine (PLAQUENIL) 200 MG tablet Take 200 mg by mouth 2 (two) times daily.     ketoconazole (NIZORAL) 2 % shampoo SMARTSIG:Topical 2-3 Times Weekly     Multiple Vitamins-Minerals (MULTI ADULT GUMMIES) CHEW Chew 2 tablets by mouth daily.     omeprazole (PRILOSEC) 40 MG capsule Take 40 mg by mouth daily as needed (acid reflux).     oxyCODONE -acetaminophen  (PERCOCET/ROXICET) 5-325 MG tablet Take 1 tablet by mouth every 8 (eight) hours as needed for severe pain (pain score 7-10). 30 tablet 0   potassium chloride  20 MEQ/15ML (10%) SOLN Take 15 mLs (20 mEq total) by mouth daily. 105 mL 0   potassium chloride  SA (KLOR-CON  M) 20 MEQ tablet Take 1 tablet (20 mEq total) by mouth 2 (two) times daily. 20 tablet 0   Probiotic Product (PROBIOTIC-10 PO) Take by mouth.     prochlorperazine  (COMPAZINE ) 10 MG tablet TAKE 1 TABLET(10 MG) BY MOUTH EVERY 6 HOURS AS NEEDED 30 tablet 2   rizatriptan (MAXALT-MLT) 10 MG disintegrating tablet Take 10 mg by mouth 2 (two) times daily as needed.     tetrahydrozoline 0.05 % ophthalmic solution Place 1 drop into both eyes once. Systane Eye Drops once daily both eyes     triamcinolone ointment (KENALOG)  0.5 % Apply topically 3 (three) times daily.     No current facility-administered medications for this visit.    SURGICAL HISTORY:  Past Surgical History:  Procedure Laterality Date   ABDOMINAL HYSTERECTOMY     BRONCHIAL BIOPSY  04/24/2021   Procedure: BRONCHIAL BIOPSIES;  Surgeon: Denson Flake, MD;  Location: [facility] ENDOSCOPY;  Service: Pulmonary;;   BRONCHIAL BRUSHINGS  04/24/2021   Procedure: BRONCHIAL BRUSHINGS;  Surgeon: Denson Flake, MD;  Location: [facility] ENDOSCOPY;  Service: Pulmonary;;   BRONCHIAL NEEDLE ASPIRATION BIOPSY  04/24/2021   Procedure: BRONCHIAL NEEDLE ASPIRATION BIOPSIES;  Surgeon: Denson Flake, MD;  Location: [facility] ENDOSCOPY;  Service: Pulmonary;;   BRONCHIAL WASHINGS  04/24/2021   Procedure: BRONCHIAL WASHINGS;  Surgeon: Denson Flake, MD;  Location: [facility] ENDOSCOPY;  Service: Pulmonary;;   CESAREAN SECTION  11/09/1990   DILATION AND CURETTAGE OF UTERUS Bilateral  09/09/1996   FIDUCIAL MARKER PLACEMENT  04/24/2021   Procedure: FIDUCIAL MARKER PLACEMENT;  Surgeon: Denson Flake, MD;  Location: [facility] ENDOSCOPY;  Service: Pulmonary;;   TONSILLECTOMY  12/10/1977   VIDEO BRONCHOSCOPY WITH ENDOBRONCHIAL NAVIGATION Bilateral 04/24/2021   Procedure: VIDEO BRONCHOSCOPY WITH ENDOBRONCHIAL NAVIGATION;  Surgeon: Denson Flake, MD;  Location: [facility] ENDOSCOPY;  Service: Pulmonary;  Laterality: Bilateral;    REVIEW OF SYSTEMS:  A comprehensive review of systems was negative.   PHYSICAL EXAMINATION: General appearance: alert, cooperative, and no distress Head: Normocephalic, without obvious abnormality, atraumatic Neck: no adenopathy, no JVD, supple, symmetrical, trachea midline, and thyroid  not enlarged, symmetric, no tenderness/mass/nodules Lymph nodes: Cervical, supraclavicular, and axillary nodes normal. Resp: clear to auscultation bilaterally Back: symmetric, no curvature. ROM normal. No CVA tenderness. Cardio: regular rate and rhythm, S1, S2 normal, no murmur, click, rub or  gallop GI: soft, non-tender; bowel sounds normal; no masses,  no organomegaly Extremities: extremities normal, atraumatic, no cyanosis or edema  ECOG PERFORMANCE STATUS: 1 - Symptomatic but completely ambulatory  Blood pressure 130/73, pulse (!) 102, temperature 97.7 F (36.5 C), temperature source Tympanic, resp. rate 18, height 5\' 1"  (1.549 m), SpO2 100%.  LABORATORY DATA: Lab Results  Component Value Date   WBC 7.3 05/13/2024   HGB 11.8 (L) 05/13/2024   HCT 36.1 05/13/2024   MCV 93.8 05/13/2024   PLT 372 05/13/2024      Chemistry      Component Value Date/Time   NA 137 05/13/2024 1043   NA 140 03/13/2021 0000   K 3.8 05/13/2024 1043   CL 101 05/13/2024 1043   CO2 26 05/13/2024 1043   BUN 15 05/13/2024 1043   BUN 10 03/13/2021 0000   CREATININE 0.94 05/13/2024 1043   GLU 107 03/13/2021 0000      Component Value Date/Time   CALCIUM 9.2 05/13/2024 1043   ALKPHOS 121 05/13/2024 1043   AST 21 05/13/2024 1043   ALT 12 05/13/2024 1043   BILITOT 0.3 05/13/2024 1043       RADIOGRAPHIC STUDIES: No results found.   ASSESSMENT AND PLAN:  This is a very pleasant 53 years old white female recently diagnosed with a stage IV non-small cell lung cancer, adenocarcinoma with no actionable mutation diagnosed in May 2022.  The patient has also history of systemic lupus erythematosus and she is not a candidate for immunotherapy. She is currently undergoing systemic chemotherapy with carboplatin  for AUC of 5, Alimta  500 Mg/M2 and Avastin  15 Mg/KG every 3 weeks status post 49 cycles.  Starting from cycle #7 the patient is on maintenance treatment with Alimta  and Avastin  every 3 weeks.   She has been tolerating this treatment fairly well. Assessment and Plan    Stage 4 non-small cell lung cancer, adenocarcinoma Diagnosed in May 2022, currently on maintenance therapy with Alimta  and Avastin  every three weeks. Completed 49 cycles, here for cycle 50. No new cancer-related complaints.  Potassium levels are good. Potential gallbladder issues discussed, no surgery planned. Avastin  should be withheld if surgery occurs. - Administer cycle 50 of Alimta  and Avastin . - Monitor gallbladder status and adjust Avastin  if surgery is planned.  Chest fluttering Intermittent chest fluttering over the last month. Discussed with Dr. Epimenio Hasten, advised monitoring. Consider EKG or heart monitor if symptoms persist or worsen to rule out cardiac issues. - Monitor chest fluttering. - Consider EKG or heart monitor if symptoms persist or worsen.   The patient was advised to call immediately if she has any concerning symptoms  in the interval. The patient voices understanding of current disease status and treatment options and is in agreement with the current care plan.  All questions were answered. The patient knows to call the clinic with any problems, questions or concerns. We can certainly see the patient much sooner if necessary. The total time spent in the appointment was 20 minutes.  Disclaimer: This note was dictated with voice recognition software. Similar sounding words can inadvertently be transcribed and may not be corrected upon review.

## 2024-05-13 NOTE — Patient Instructions (Signed)
 [facility] - A DEPT OF [facility]  Discharge Instructions: Thank you for choosing [facility] to provide your oncology and hematology care.   If you have a lab appointment with the Cancer Center, please go directly to the Cancer Center and check in at the registration area.   Wear comfortable clothing and clothing appropriate for easy access to any Portacath or PICC line.   We strive to give you quality time with your provider. You may need to reschedule your appointment if you arrive late (15 or more minutes).  Arriving late affects you and other patients whose appointments are after yours.  Also, if you miss three or more appointments without notifying the office, you may be dismissed from the clinic at the provider's discretion.      For prescription refill requests, have your pharmacy contact our office and allow 72 hours for refills to be completed.    Today you received the following chemotherapy and/or immunotherapy agents: Bevacizumab  and Alimta       To help prevent nausea and vomiting after your treatment, we encourage you to take your nausea medication as directed.  BELOW ARE SYMPTOMS THAT SHOULD BE REPORTED IMMEDIATELY: *FEVER GREATER THAN 100.4 F (38 C) OR HIGHER *CHILLS OR SWEATING *NAUSEA AND VOMITING THAT IS NOT CONTROLLED WITH YOUR NAUSEA MEDICATION *UNUSUAL SHORTNESS OF BREATH *UNUSUAL BRUISING OR BLEEDING *URINARY PROBLEMS (pain or burning when urinating, or frequent urination) *BOWEL PROBLEMS (unusual diarrhea, constipation, pain near the anus) TENDERNESS IN MOUTH AND THROAT WITH OR WITHOUT PRESENCE OF ULCERS (sore throat, sores in mouth, or a toothache) UNUSUAL RASH, SWELLING OR PAIN  UNUSUAL VAGINAL DISCHARGE OR ITCHING   Items with * indicate a potential emergency and should be followed up as soon as possible or go to the Emergency Department if any problems should occur.  Please show the CHEMOTHERAPY ALERT CARD or  IMMUNOTHERAPY ALERT CARD at check-in to the Emergency Department and triage nurse.  Should you have questions after your visit or need to cancel or reschedule your appointment, please contact [facility] - A DEPT OF [facility]  Dept: [phone]  and follow the prompts.  Office hours are 8:00 a.m. to 4:30 p.m. Monday - Friday. Please note that voicemails left after 4:00 p.m. may not be returned until the following business day.  We are closed weekends and major holidays. You have access to a nurse at all times for urgent questions. Please call the main number to the clinic Dept: [phone] and follow the prompts.   For any non-urgent questions, you may also contact your provider using MyChart. We now offer e-Visits for anyone 41 and older to request care online for non-urgent symptoms. For details visit [email or web address].   Also download the MyChart app! Go to the app store, search "MyChart", open the app, select [facility], and log in with your MyChart username and password.

## 2024-05-27 ENCOUNTER — Encounter: Payer: Self-pay | Admitting: Internal Medicine

## 2024-05-27 NOTE — Progress Notes (Signed)
 [facility] OFFICE PROGRESS NOTE  Kirsten Lot, PA-C [address] [place] [address]  DIAGNOSIS: Stage IV (T4, N2, M1 a) non-small cell lung cancer, adenocarcinoma presented with multifocal disease involving the right upper lobe, right lower lobe as well as suspicious lower paratracheal lymphadenopathy and groundglass nodules in the left lung diagnosed in May 2022. The patient had molecular studies performed by [facility] and that showed no detectable mutation but this is likely secondary to low-level circulating free tumor DNA   PRIOR THERAPY: None  CURRENT THERAPY: Palliative systemic chemotherapy with carboplatin  for AUC of 5, Alimta  500 Mg/M2 and Avastin  15 Mg/KG every 3 weeks.  The patient is not a great candidate for immunotherapy in the event that she has a genetic mutation.  She is status post 50 cycles. Starting from cycle #6, the patient will start maintenance Alimta  and Avastin .    INTERVAL HISTORY: Kirsten Williams 53 y.o. female returns to the clinic today for a follow-up visit.  The patient was last seen 3 weeks ago by Dr. Sherrod.   The patient states overall she is feeling well today. She is scheduled for dental implants on July 8th, with two implants to be placed. She is coordinating the timing with her treatment schedule to avoid complications such as bleeding and delayed wound healing.  She was seeing GI for abdominal bloating. She had a HIDA scan performed at GI and tells me that came back acceptable. She has been managing her diet to avoid foods that cause stomach discomfort, successfully identifying and avoiding these foods, which has improved her symptoms.  She has a history of skin issues and is followed by dermatology, including a rash on her back that has worsened despite using prescribed medication. She plans to see her dermatologist on Friday.   She tolerates her treatment well.  She denies any fever. She does get cold from time to time. She  gained 2 lbs. She denies any changes in her breathing with any worsening shortness of breath or cough.  On a regular basis after treatment she does experience a bandlike sensation around her diaphragm which is unchanged.  She denies any chest pain.  She denies any nausea or vomiting.  She denies any diarrhea or constipation.  She denies any abnormal bleeding or bruising.  She is here today for evaluation and repeat blood work before undergoing cycle #51.  MEDICAL HISTORY: Past Medical History:  Diagnosis Date   Adenocarcinoma, lung, right (HCC) 05/03/2021   Kirsten Williams is a 53 y.o. female with a history of lung nodules dating back to 2019 who is referred in consultation with Williams Montey, PA-C for assessment and management. She is a former smoker having quit in 2016. 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 She missed her screening CT last year and imaging was ordered last month. CT chest from 02-16-2021 reveals pro   Anxiety    GERD (gastroesophageal reflux disease)    SLE (systemic lupus erythematosus) (HCC) 05/03/2021   SLE (systemic lupus erythematosus) (HCC) 05/03/2021    ALLERGIES:  is allergic to clindamycin/lincomycin and carboplatin .  MEDICATIONS:  Current Outpatient Medications  Medication Sig Dispense Refill   amLODipine (NORVASC) 5 MG tablet Take 5 mg by mouth daily.     B Complex Vitamins (B COMPLEX 50 PO) Take by mouth.     Calcium Carbonate-Vit D-Min (CALCIUM 1200 PO) Take 1 capsule by mouth daily.     cholecalciferol (VITAMIN D3)  25 MCG (1000 UNIT) tablet Take 2,000 Units by mouth daily.     CLOBETASOL PROPIONATE E 0.05 % emollient cream Apply topically.     cyclobenzaprine (FLEXERIL) 10 MG tablet Take 10 mg by mouth at bedtime as needed for muscle spasms.     denosumab  (PROLIA ) 60 MG/ML SOSY injection Inject 60 mg into the skin every 6 (six) months.     dexamethasone  (DECADRON ) 4 MG tablet 4 mg p.o. twice daily the day before, day of and day  after the chemotherapy every 3 weeks. 40 tablet 2   dicyclomine (BENTYL) 10 MG capsule Take 10 mg by mouth every 6 (six) hours as needed.     diphenhydrAMINE  (BENADRYL ) 25 MG tablet Take 25 mg by mouth daily as needed for allergies.     famotidine  (PEPCID ) 20 MG tablet Take 20 mg by mouth at bedtime.     fluticasone (FLONASE) 50 MCG/ACT nasal spray Place 2 sprays into both nostrils daily.     folic acid  (FOLVITE ) 1 MG tablet TAKE 1 TABLET(1 MG) BY MOUTH DAILY 30 tablet 4   gabapentin (NEURONTIN) 300 MG capsule Take 300 mg by mouth 3 (three) times daily. Patient takes once or twice a day.     hydrocortisone  1 % lotion Apply 1 application topically 2 (two) times daily. 118 mL 0   hydroxychloroquine (PLAQUENIL) 200 MG tablet Take 200 mg by mouth 2 (two) times daily.     ketoconazole (NIZORAL) 2 % shampoo SMARTSIG:Topical 2-3 Times Weekly     Multiple Vitamins-Minerals (MULTI ADULT GUMMIES) CHEW Chew 2 tablets by mouth daily.     omeprazole (PRILOSEC) 40 MG capsule Take 40 mg by mouth daily as needed (acid reflux).     oxyCODONE -acetaminophen  (PERCOCET/ROXICET) 5-325 MG tablet Take 1 tablet by mouth every 8 (eight) hours as needed for severe pain (pain score 7-10). 30 tablet 0   potassium chloride  20 MEQ/15ML (10%) SOLN Take 15 mLs (20 mEq total) by mouth daily. 105 mL 0   potassium chloride  SA (KLOR-CON  M) 20 MEQ tablet Take 1 tablet (20 mEq total) by mouth 2 (two) times daily. 20 tablet 0   Probiotic Product (PROBIOTIC-10 PO) Take by mouth.     prochlorperazine  (COMPAZINE ) 10 MG tablet TAKE 1 TABLET(10 MG) BY MOUTH EVERY 6 HOURS AS NEEDED 30 tablet 2   rizatriptan (MAXALT-MLT) 10 MG disintegrating tablet Take 10 mg by mouth 2 (two) times daily as needed.     tetrahydrozoline 0.05 % ophthalmic solution Place 1 drop into both eyes once. Systane Eye Drops once daily both eyes     triamcinolone ointment (KENALOG) 0.5 % Apply topically 3 (three) times daily.     No current facility-administered  medications for this visit.    SURGICAL HISTORY:  Past Surgical History:  Procedure Laterality Date   ABDOMINAL HYSTERECTOMY     BRONCHIAL BIOPSY  04/24/2021   Procedure: BRONCHIAL BIOPSIES;  Surgeon: Shelah Lamar RAMAN, MD;  Location: [facility] ENDOSCOPY;  Service: Pulmonary;;   BRONCHIAL BRUSHINGS  04/24/2021   Procedure: BRONCHIAL BRUSHINGS;  Surgeon: Shelah Lamar RAMAN, MD;  Location: [facility] ENDOSCOPY;  Service: Pulmonary;;   BRONCHIAL NEEDLE ASPIRATION BIOPSY  04/24/2021   Procedure: BRONCHIAL NEEDLE ASPIRATION BIOPSIES;  Surgeon: Shelah Lamar RAMAN, MD;  Location: [facility] ENDOSCOPY;  Service: Pulmonary;;   BRONCHIAL WASHINGS  04/24/2021   Procedure: BRONCHIAL WASHINGS;  Surgeon: Shelah Lamar RAMAN, MD;  Location: [facility] ENDOSCOPY;  Service: Pulmonary;;   CESAREAN SECTION  11/09/1990   DILATION AND CURETTAGE OF UTERUS Bilateral  09/09/1996   FIDUCIAL MARKER PLACEMENT  04/24/2021   Procedure: FIDUCIAL MARKER PLACEMENT;  Surgeon: Shelah Lamar RAMAN, MD;  Location: [facility] ENDOSCOPY;  Service: Pulmonary;;   TONSILLECTOMY  12/10/1977   VIDEO BRONCHOSCOPY WITH ENDOBRONCHIAL NAVIGATION Bilateral 04/24/2021   Procedure: VIDEO BRONCHOSCOPY WITH ENDOBRONCHIAL NAVIGATION;  Surgeon: Shelah Lamar RAMAN, MD;  Location: [facility] ENDOSCOPY;  Service: Pulmonary;  Laterality: Bilateral;    REVIEW OF SYSTEMS:   Constitutional: Positive for stable fatigue. Negative for appetite change, chills,  and fever.  HENT: Negative for mouth sores, nosebleeds, sore throat and trouble swallowing.   Eyes: Negative for eye problems and icterus.  Respiratory: Negative for cough, hemoptysis, shortness of breath and wheezing.   Cardiovascular: Negative for chest pain (gets chest tightness routinely after treatment but none at this time) and leg swelling.  Gastrointestinal: Positive for intermittent chronic abdominal bloating, nausea, and early satiety (improved compared to prior). Negative for constipation, diarrhea, and vomiting.  Genitourinary: Negative for bladder  incontinence, difficulty urinating, dysuria, frequency and hematuria.   Musculoskeletal:  Negative for back pain, gait problem, neck pain and neck stiffness.  Skin: Positive for rash on chest, arms, and back. Neurological: Negative for dizziness, extremity weakness, gait problem, headaches, and seizures.  Hematological: Negative for adenopathy. Does not bruise/bleed easily.  Psychiatric/Behavioral: Negative for confusion, depression and sleep disturbance. The patient is not nervous/anxious.      PHYSICAL EXAMINATION:  There were no vitals taken for this visit.  ECOG PERFORMANCE STATUS: 1  Physical Exam  Constitutional: Oriented to person, place, and time and well-developed, well-nourished, and in no distress.  HENT:  Head: Normocephalic and atraumatic.  Mouth/Throat: Oropharynx is clear and moist. No oropharyngeal exudate.  Eyes: Conjunctivae are normal. Right eye exhibits no discharge. Left eye exhibits no discharge. No scleral icterus.  Neck: Normal range of motion. Neck supple.  Cardiovascular: Normal rate, regular rhythm, normal heart sounds and intact distal pulses.   Pulmonary/Chest: Effort normal and breath sounds normal. No respiratory distress. No wheezes. No rales.  Abdominal: Soft. Bowel sounds are normal. Exhibits no distension and no mass. There is no tenderness.  Musculoskeletal: Normal range of motion. Exhibits no edema.  Lymphadenopathy:    No cervical adenopathy.  Neurological: Alert and oriented to person, place, and time. Exhibits normal muscle tone. Gait normal. Coordination normal.  Skin: Skin is warm and dry. Positive for rash on arms, chest, and back. Not diaphoretic. No erythema. No pallor.  Psychiatric: Mood, memory and judgment normal.  Vitals reviewed.  LABORATORY DATA: Lab Results  Component Value Date   WBC 7.3 05/13/2024   HGB 11.8 (L) 05/13/2024   HCT 36.1 05/13/2024   MCV 93.8 05/13/2024   PLT 372 05/13/2024      Chemistry      Component  Value Date/Time   NA 137 05/13/2024 1043   NA 140 03/13/2021 0000   K 3.8 05/13/2024 1043   CL 101 05/13/2024 1043   CO2 26 05/13/2024 1043   BUN 15 05/13/2024 1043   BUN 10 03/13/2021 0000   CREATININE 0.94 05/13/2024 1043   GLU 107 03/13/2021 0000      Component Value Date/Time   CALCIUM 9.2 05/13/2024 1043   ALKPHOS 121 05/13/2024 1043   AST 21 05/13/2024 1043   ALT 12 05/13/2024 1043   BILITOT 0.3 05/13/2024 1043       RADIOGRAPHIC STUDIES:  No results found.   ASSESSMENT/PLAN:  This is a very pleasant 53 year old Caucasian female diagnosed with stage IV (T4, N2, M1  a) non-small cell lung cancer, adenocarcinoma presented with multifocal disease involving the right upper lobe, right lower lobe as well as suspicious lower paratracheal lymphadenopathy and groundglass nodules in the left lung diagnosed in May 2022. The patient had molecular studies performed by [facility] and that showed no detectable mutation but this is likely secondary to low-level circulating free tumor DNA. If the patient has disease progression in the future, then we will likely retest her for molecular studies.      The patient is currently undergoing systemic chemotherapy with carboplatin  for an AUC 5, Alimta  500 mg per metered square, and and Avastin  15 mg/kg IV every 3 weeks.  She is status post 50 cycles.  Starting from cycle #7, the patient has been on maintenance treatment with Alimta  and Avastin .  Labs were reviewed. Ok to treat with urine protein of 100. Ok to treat with systolic BP of 152. Recommend that she proceed with cycle #51 today as scheduled.  Will see her back for labs and a follow-up visit in 3 weeks for evaluation and repeat blood work before undergoing cycle #52. Since she is having dental implants on 7/8, we will hold avastin  with her 7/16 appointment. I have already removed this from her care plan. Per Dr. Sherrod, ok to proceed with avastin  and alimta  today.   She will see her  dermatologist later this week for her rash.   The patient was advised to call immediately if she has any concerning symptoms in the interval. The patient voices understanding of current disease status and treatment options and is in agreement with the current care plan. All questions were answered. The patient knows to call the clinic with any problems, questions or concerns. We can certainly see the patient much sooner if necessary    No orders of the defined types were placed in this encounter.    The total time spent in the appointment was 20-29 minutes  Deylan Canterbury L Apollonia Amini, PA-C 05/27/24

## 2024-06-03 ENCOUNTER — Inpatient Hospital Stay: Admitting: Physician Assistant

## 2024-06-03 ENCOUNTER — Inpatient Hospital Stay: Admitting: Dietician

## 2024-06-03 ENCOUNTER — Inpatient Hospital Stay

## 2024-06-03 VITALS — BP 152/83 | HR 107 | Temp 97.8°F | Resp 16 | Wt 95.4 lb

## 2024-06-03 DIAGNOSIS — Z5111 Encounter for antineoplastic chemotherapy: Secondary | ICD-10-CM | POA: Diagnosis not present

## 2024-06-03 DIAGNOSIS — Z5112 Encounter for antineoplastic immunotherapy: Secondary | ICD-10-CM | POA: Diagnosis not present

## 2024-06-03 DIAGNOSIS — C3491 Malignant neoplasm of unspecified part of right bronchus or lung: Secondary | ICD-10-CM | POA: Diagnosis not present

## 2024-06-03 LAB — CBC WITH DIFFERENTIAL (CANCER CENTER ONLY)
Abs Immature Granulocytes: 0.04 10*3/uL (ref 0.00–0.07)
Basophils Absolute: 0.1 10*3/uL (ref 0.0–0.1)
Basophils Relative: 1 %
Eosinophils Absolute: 0.1 10*3/uL (ref 0.0–0.5)
Eosinophils Relative: 2 %
HCT: 35.9 % — ABNORMAL LOW (ref 36.0–46.0)
Hemoglobin: 11.3 g/dL — ABNORMAL LOW (ref 12.0–15.0)
Immature Granulocytes: 1 %
Lymphocytes Relative: 22 %
Lymphs Abs: 1.8 10*3/uL (ref 0.7–4.0)
MCH: 30 pg (ref 26.0–34.0)
MCHC: 31.5 g/dL (ref 30.0–36.0)
MCV: 95.2 fL (ref 80.0–100.0)
Monocytes Absolute: 1.2 10*3/uL — ABNORMAL HIGH (ref 0.1–1.0)
Monocytes Relative: 14 %
Neutro Abs: 5.1 10*3/uL (ref 1.7–7.7)
Neutrophils Relative %: 60 %
Platelet Count: 402 10*3/uL — ABNORMAL HIGH (ref 150–400)
RBC: 3.77 MIL/uL — ABNORMAL LOW (ref 3.87–5.11)
RDW: 18 % — ABNORMAL HIGH (ref 11.5–15.5)
WBC Count: 8.3 10*3/uL (ref 4.0–10.5)
nRBC: 0 % (ref 0.0–0.2)

## 2024-06-03 LAB — CMP (CANCER CENTER ONLY)
ALT: 11 U/L (ref 0–44)
AST: 22 U/L (ref 15–41)
Albumin: 4.1 g/dL (ref 3.5–5.0)
Alkaline Phosphatase: 138 U/L — ABNORMAL HIGH (ref 38–126)
Anion gap: 13 (ref 5–15)
BUN: 7 mg/dL (ref 6–20)
CO2: 23 mmol/L (ref 22–32)
Calcium: 9.7 mg/dL (ref 8.9–10.3)
Chloride: 101 mmol/L (ref 98–111)
Creatinine: 1.08 mg/dL — ABNORMAL HIGH (ref 0.44–1.00)
GFR, Estimated: >60 mL/min (ref >60)
Glucose, Bld: 101 mg/dL — ABNORMAL HIGH (ref 70–99)
Potassium: 3.9 mmol/L (ref 3.5–5.1)
Sodium: 137 mmol/L (ref 135–145)
Total Bilirubin: 0.3 mg/dL (ref 0.0–1.2)
Total Protein: 8.5 g/dL — ABNORMAL HIGH (ref 6.5–8.1)

## 2024-06-03 LAB — TOTAL PROTEIN, URINE DIPSTICK: Protein, ur: 100 mg/dL — AB

## 2024-06-03 MED ORDER — SODIUM CHLORIDE 0.9 % IV SOLN: Freq: Once | INTRAVENOUS | Status: AC

## 2024-06-03 MED ORDER — DEXAMETHASONE SODIUM PHOSPHATE 10 MG/ML IJ SOLN
10.0000 mg | Freq: Once | INTRAMUSCULAR | Status: AC
  Administered 2024-06-03: 10 mg via INTRAVENOUS

## 2024-06-03 MED ORDER — SODIUM CHLORIDE 0.9 % IV SOLN
500.0000 mg/m2 | Freq: Once | INTRAVENOUS | Status: AC
  Administered 2024-06-03: 700 mg via INTRAVENOUS
  Filled 2024-06-03: qty 20

## 2024-06-03 MED ORDER — PROCHLORPERAZINE MALEATE 10 MG PO TABS
10.0000 mg | ORAL_TABLET | Freq: Once | ORAL | Status: AC
  Administered 2024-06-03: 10 mg via ORAL

## 2024-06-03 MED ORDER — SODIUM CHLORIDE 0.9 % IV SOLN
700.0000 mg | Freq: Once | INTRAVENOUS | Status: AC
  Administered 2024-06-03: 700 mg via INTRAVENOUS
  Filled 2024-06-03: qty 12

## 2024-06-03 NOTE — Progress Notes (Signed)
 Per Cassie Heilingoetter, PA, ok to treat with elevated urine protein and blood pressure and heart rate.

## 2024-06-03 NOTE — Progress Notes (Signed)
 Nutrition Follow-up:   Patient with stage IV non-small cell lung cancer. She is currently receiving maintenance therapy with Alimta  + Avastin  q3w.    Met with pt in infusion. She is doing great. Patient endorses good appetite. Reports eating lots of fresh fruits and vegetables from her garden. Looking forward to a squash and tomato sandwich for dinner tonight. Pt has not been eating much meat. This does not appeal to her.    Medications: reviewed   Labs: glucose 101, Cr 1.08   Anthropometrics: Wt 95 lb 6.4 oz today - stable   5/14 - 95 lb 11.2 oz     NUTRITION DIAGNOSIS: Unintended wt loss - stable     INTERVENTION:  Encourage high calorie high protein foods to promote wt gain Discussed plant-based proteins, encourage protein foods at every meal    MONITORING, EVALUATION, GOAL: wt trends, intake   NEXT VISIT: To be scheduled as needed

## 2024-06-03 NOTE — Patient Instructions (Signed)
 [facility] - A DEPT OF [facility]  Discharge Instructions: Thank you for choosing [facility] to provide your oncology and hematology care.   If you have a lab appointment with the Cancer Center, please go directly to the Cancer Center and check in at the registration area.   Wear comfortable clothing and clothing appropriate for easy access to any Portacath or PICC line.   We strive to give you quality time with your provider. You may need to reschedule your appointment if you arrive late (15 or more minutes).  Arriving late affects you and other patients whose appointments are after yours.  Also, if you miss three or more appointments without notifying the office, you may be dismissed from the clinic at the provider's discretion.      For prescription refill requests, have your pharmacy contact our office and allow 72 hours for refills to be completed.    Today you received the following chemotherapy and/or immunotherapy agents avastin , alimta       To help prevent nausea and vomiting after your treatment, we encourage you to take your nausea medication as directed.  BELOW ARE SYMPTOMS THAT SHOULD BE REPORTED IMMEDIATELY: *FEVER GREATER THAN 100.4 F (38 C) OR HIGHER *CHILLS OR SWEATING *NAUSEA AND VOMITING THAT IS NOT CONTROLLED WITH YOUR NAUSEA MEDICATION *UNUSUAL SHORTNESS OF BREATH *UNUSUAL BRUISING OR BLEEDING *URINARY PROBLEMS (pain or burning when urinating, or frequent urination) *BOWEL PROBLEMS (unusual diarrhea, constipation, pain near the anus) TENDERNESS IN MOUTH AND THROAT WITH OR WITHOUT PRESENCE OF ULCERS (sore throat, sores in mouth, or a toothache) UNUSUAL RASH, SWELLING OR PAIN  UNUSUAL VAGINAL DISCHARGE OR ITCHING   Items with * indicate a potential emergency and should be followed up as soon as possible or go to the Emergency Department if any problems should occur.  Please show the CHEMOTHERAPY ALERT CARD or  IMMUNOTHERAPY ALERT CARD at check-in to the Emergency Department and triage nurse.  Should you have questions after your visit or need to cancel or reschedule your appointment, please contact [facility] - A DEPT OF [facility]  Dept: [phone]  and follow the prompts.  Office hours are 8:00 a.m. to 4:30 p.m. Monday - Friday. Please note that voicemails left after 4:00 p.m. may not be returned until the following business day.  We are closed weekends and major holidays. You have access to a nurse at all times for urgent questions. Please call the main number to the clinic Dept: [phone] and follow the prompts.   For any non-urgent questions, you may also contact your provider using MyChart. We now offer e-Visits for anyone 35 and older to request care online for non-urgent symptoms. For details visit [email or web address].   Also download the MyChart app! Go to the app store, search MyChart, open the app, select [place], and log in with your MyChart username and password.

## 2024-06-24 ENCOUNTER — Inpatient Hospital Stay

## 2024-06-24 ENCOUNTER — Inpatient Hospital Stay: Admitting: Internal Medicine

## 2024-06-24 ENCOUNTER — Inpatient Hospital Stay: Attending: Hematology and Oncology

## 2024-06-24 VITALS — BP 134/73 | HR 95 | Temp 98.0°F | Resp 17 | Ht 61.0 in | Wt 92.0 lb

## 2024-06-24 DIAGNOSIS — M329 Systemic lupus erythematosus, unspecified: Secondary | ICD-10-CM | POA: Insufficient documentation

## 2024-06-24 DIAGNOSIS — C3491 Malignant neoplasm of unspecified part of right bronchus or lung: Secondary | ICD-10-CM | POA: Diagnosis not present

## 2024-06-24 DIAGNOSIS — R634 Abnormal weight loss: Secondary | ICD-10-CM | POA: Insufficient documentation

## 2024-06-24 DIAGNOSIS — Z5112 Encounter for antineoplastic immunotherapy: Secondary | ICD-10-CM | POA: Insufficient documentation

## 2024-06-24 DIAGNOSIS — Z5111 Encounter for antineoplastic chemotherapy: Secondary | ICD-10-CM | POA: Diagnosis present

## 2024-06-24 DIAGNOSIS — C3411 Malignant neoplasm of upper lobe, right bronchus or lung: Secondary | ICD-10-CM | POA: Insufficient documentation

## 2024-06-24 LAB — CMP (CANCER CENTER ONLY)
ALT: 14 U/L (ref 0–44)
AST: 23 U/L (ref 15–41)
Albumin: 4 g/dL (ref 3.5–5.0)
Alkaline Phosphatase: 105 U/L (ref 38–126)
Anion gap: 9 (ref 5–15)
BUN: 21 mg/dL — ABNORMAL HIGH (ref 6–20)
CO2: 25 mmol/L (ref 22–32)
Calcium: 10.3 mg/dL (ref 8.9–10.3)
Chloride: 105 mmol/L (ref 98–111)
Creatinine: 0.91 mg/dL (ref 0.44–1.00)
GFR, Estimated: >60 mL/min (ref >60)
Glucose, Bld: 132 mg/dL — ABNORMAL HIGH (ref 70–99)
Potassium: 4 mmol/L (ref 3.5–5.1)
Sodium: 139 mmol/L (ref 135–145)
Total Bilirubin: 0.3 mg/dL (ref 0.0–1.2)
Total Protein: 7.8 g/dL (ref 6.5–8.1)

## 2024-06-24 LAB — CBC WITH DIFFERENTIAL (CANCER CENTER ONLY)
Abs Immature Granulocytes: 0.2 K/uL — ABNORMAL HIGH (ref 0.00–0.07)
Basophils Absolute: 0 K/uL (ref 0.0–0.1)
Basophils Relative: 0 %
Eosinophils Absolute: 0.1 K/uL (ref 0.0–0.5)
Eosinophils Relative: 1 %
HCT: 38 % (ref 36.0–46.0)
Hemoglobin: 12.2 g/dL (ref 12.0–15.0)
Immature Granulocytes: 2 %
Lymphocytes Relative: 20 %
Lymphs Abs: 2.6 K/uL (ref 0.7–4.0)
MCH: 30.7 pg (ref 26.0–34.0)
MCHC: 32.1 g/dL (ref 30.0–36.0)
MCV: 95.7 fL (ref 80.0–100.0)
Monocytes Absolute: 1.6 K/uL — ABNORMAL HIGH (ref 0.1–1.0)
Monocytes Relative: 12 %
Neutro Abs: 8.7 K/uL — ABNORMAL HIGH (ref 1.7–7.7)
Neutrophils Relative %: 65 %
Platelet Count: 380 K/uL (ref 150–400)
RBC: 3.97 MIL/uL (ref 3.87–5.11)
RDW: 20.3 % — ABNORMAL HIGH (ref 11.5–15.5)
WBC Count: 13.2 K/uL — ABNORMAL HIGH (ref 4.0–10.5)
nRBC: 0 % (ref 0.0–0.2)

## 2024-06-24 LAB — TOTAL PROTEIN, URINE DIPSTICK: Protein, ur: 100 mg/dL — AB

## 2024-06-24 MED ORDER — SODIUM CHLORIDE 0.9 % IV SOLN: Freq: Once | INTRAVENOUS | Status: AC

## 2024-06-24 MED ORDER — PROCHLORPERAZINE MALEATE 10 MG PO TABS
10.0000 mg | ORAL_TABLET | Freq: Once | ORAL | Status: AC
  Administered 2024-06-24: 10 mg via ORAL
  Filled 2024-06-24: qty 1

## 2024-06-24 MED ORDER — DEXAMETHASONE SODIUM PHOSPHATE 10 MG/ML IJ SOLN
10.0000 mg | Freq: Once | INTRAMUSCULAR | Status: AC
  Administered 2024-06-24: 10 mg via INTRAVENOUS
  Filled 2024-06-24: qty 1

## 2024-06-24 MED ORDER — SODIUM CHLORIDE 0.9 % IV SOLN
700.0000 mg | Freq: Once | INTRAVENOUS | Status: AC
  Administered 2024-06-24: 700 mg via INTRAVENOUS
  Filled 2024-06-24: qty 12

## 2024-06-24 MED ORDER — SODIUM CHLORIDE 0.9 % IV SOLN
500.0000 mg/m2 | Freq: Once | INTRAVENOUS | Status: AC
  Administered 2024-06-24: 700 mg via INTRAVENOUS
  Filled 2024-06-24: qty 20

## 2024-06-24 NOTE — Progress Notes (Addendum)
 OK to treat w/ UP 100 & continue Bevacizumab  dose at 700 mg despite wt decrease per Dr. Sherrod.  Hansel Devan, Pharm.D., CPP 06/24/2024@12 :58 PM

## 2024-06-24 NOTE — Progress Notes (Signed)
 [name] [phone]   [phone]  OFFICE PROGRESS NOTE  Williams Lot, PA-C [address] [place] [address]  DIAGNOSIS:  Stage IV (T4, N2, M1 a) non-small cell lung cancer, adenocarcinoma presented with multifocal disease involving the right upper lobe, right lower lobe as well as suspicious lower paratracheal lymphadenopathy and groundglass nodules in the left lung diagnosed in May 2022. The patient had molecular studies performed by [facility] and that showed no detectable mutation but this is likely secondary to low-level circulating free tumor DNA.  PRIOR THERAPY: None.  CURRENT THERAPY: palliative systemic chemotherapy with carboplatin  for AUC of 5, Alimta  500 Mg/M2 and Avastin  15 Mg/KG every 3 weeks.  The patient is not a great candidate for immunotherapy because of her history of active systemic lupus erythematosus.  Starting from cycle #7 the patient is on maintenance treatment with Alimta  and Avastin  every 3 weeks.  She is status post 51 cycles.  INTERVAL HISTORY: Kirsten Williams 53 y.o. female returns to the clinic today for follow-up visit.  Discussed the use of AI scribe software for clinical note transcription with the patient, who gave verbal consent to proceed.  History of Present Illness Kirsten Williams is a 53 year old female with stage four non-small cell lung cancer who presents for chemotherapy follow-up.  Diagnosed with stage four non-small cell lung cancer, adenocarcinoma, in May 2022, she has been undergoing systemic chemotherapy with carboplatin , pemetrexed , and bevacizumab  every three months for a year. Despite a good appetite and consuming a diet including fresh foods from her garden, chicken pies, and steaks, she continues to experience weight loss, which is a source of frustration for her.  She has a history of lupus, which has recently flared up, causing a rash. A biopsy confirmed the rash is related to lupus.  Recent blood work showed stable levels, and she is scheduled to see a rheumatologist on September 11.  She experiences heart fluttering and has been prescribed metoprolol, which she has not yet started. She plans to discuss this at her upcoming cardiology appointment. The patient reports that after receiving a steroid injection, she noticed a significant increase in her appetite.  She has a history of gallbladder issues, with a recent evaluation showing a function of 59%. She experiences stomach pain but has not pursued further evaluation due to frustration with the process.     MEDICAL HISTORY: Past Medical History:  Diagnosis Date   Adenocarcinoma, lung, right (HCC) 05/03/2021   Kirsten Williams is a 53 y.o. female with a history of lung nodules dating back to 2019 who is referred in consultation with Kirsten Montey, PA-C for assessment and management. She is a former smoker having quit in 2016. 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 She missed her screening CT last year and imaging was ordered last month. CT chest from 02-16-2021 reveals pro   Anxiety    GERD (gastroesophageal reflux disease)    SLE (systemic lupus erythematosus) (HCC) 05/03/2021   SLE (systemic lupus erythematosus) (HCC) 05/03/2021    ALLERGIES:  is allergic to clindamycin/lincomycin, losartan, and carboplatin .  MEDICATIONS:  Current Outpatient Medications  Medication Sig Dispense Refill   amLODipine (NORVASC) 5 MG tablet Take 5 mg by mouth daily.     B Complex Vitamins (B COMPLEX 50 PO) Take by mouth.     Calcium Carbonate-Vit D-Min (CALCIUM 1200 PO) Take 1 capsule by mouth daily.  cholecalciferol (VITAMIN D3) 25 MCG (1000 UNIT) tablet Take 2,000 Units by mouth daily.     CLOBETASOL PROPIONATE E 0.05 % emollient cream Apply topically.     cyclobenzaprine (FLEXERIL) 10 MG tablet Take 10 mg by mouth at bedtime as needed for muscle spasms.     denosumab  (PROLIA ) 60 MG/ML SOSY injection  Inject 60 mg into the skin every 6 (six) months.     dexamethasone  (DECADRON ) 4 MG tablet 4 mg p.o. twice daily the day before, day of and day after the chemotherapy every 3 weeks. 40 tablet 2   dicyclomine (BENTYL) 10 MG capsule Take 10 mg by mouth every 6 (six) hours as needed.     diphenhydrAMINE  (BENADRYL ) 25 MG tablet Take 25 mg by mouth daily as needed for allergies.     famotidine  (PEPCID ) 20 MG tablet Take 20 mg by mouth at bedtime.     fluticasone (FLONASE) 50 MCG/ACT nasal spray Place 2 sprays into both nostrils daily.     folic acid  (FOLVITE ) 1 MG tablet TAKE 1 TABLET(1 MG) BY MOUTH DAILY 30 tablet 4   gabapentin (NEURONTIN) 300 MG capsule Take 300 mg by mouth 3 (three) times daily. Patient takes once or twice a day.     hydrochlorothiazide (HYDRODIURIL) 25 MG tablet Take 25 mg by mouth daily.     hydrocortisone  1 % lotion Apply 1 application topically 2 (two) times daily. 118 mL 0   hydroxychloroquine (PLAQUENIL) 200 MG tablet Take 200 mg by mouth 2 (two) times daily.     ketoconazole (NIZORAL) 2 % shampoo SMARTSIG:Topical 2-3 Times Weekly     Multiple Vitamins-Minerals (MULTI ADULT GUMMIES) CHEW Chew 2 tablets by mouth daily.     omeprazole (PRILOSEC) 40 MG capsule Take 40 mg by mouth daily as needed (acid reflux).     oxyCODONE -acetaminophen  (PERCOCET/ROXICET) 5-325 MG tablet Take 1 tablet by mouth every 8 (eight) hours as needed for severe pain (pain score 7-10). 30 tablet 0   potassium chloride  20 MEQ/15ML (10%) SOLN Take 15 mLs (20 mEq total) by mouth daily. 105 mL 0   potassium chloride  SA (KLOR-CON  M) 20 MEQ tablet Take 1 tablet (20 mEq total) by mouth 2 (two) times daily. 20 tablet 0   Probiotic Product (PROBIOTIC-10 PO) Take by mouth.     prochlorperazine  (COMPAZINE ) 10 MG tablet TAKE 1 TABLET(10 MG) BY MOUTH EVERY 6 HOURS AS NEEDED 30 tablet 2   rizatriptan (MAXALT-MLT) 10 MG disintegrating tablet Take 10 mg by mouth 2 (two) times daily as needed.     tetrahydrozoline 0.05 %  ophthalmic solution Place 1 drop into both eyes once. Systane Eye Drops once daily both eyes     triamcinolone ointment (KENALOG) 0.5 % Apply topically 3 (three) times daily.     No current facility-administered medications for this visit.    SURGICAL HISTORY:  Past Surgical History:  Procedure Laterality Date   ABDOMINAL HYSTERECTOMY     BRONCHIAL BIOPSY  04/24/2021   Procedure: BRONCHIAL BIOPSIES;  Surgeon: Shelah Lamar RAMAN, MD;  Location: [facility] ENDOSCOPY;  Service: Pulmonary;;   BRONCHIAL BRUSHINGS  04/24/2021   Procedure: BRONCHIAL BRUSHINGS;  Surgeon: Shelah Lamar RAMAN, MD;  Location: [facility] ENDOSCOPY;  Service: Pulmonary;;   BRONCHIAL NEEDLE ASPIRATION BIOPSY  04/24/2021   Procedure: BRONCHIAL NEEDLE ASPIRATION BIOPSIES;  Surgeon: Shelah Lamar RAMAN, MD;  Location: [facility] ENDOSCOPY;  Service: Pulmonary;;   BRONCHIAL WASHINGS  04/24/2021   Procedure: BRONCHIAL WASHINGS;  Surgeon: Shelah Lamar RAMAN, MD;  Location: [facility]  ENDOSCOPY;  Service: Pulmonary;;   CESAREAN SECTION  11/09/1990   DILATION AND CURETTAGE OF UTERUS Bilateral 09/09/1996   FIDUCIAL MARKER PLACEMENT  04/24/2021   Procedure: FIDUCIAL MARKER PLACEMENT;  Surgeon: Shelah Lamar RAMAN, MD;  Location[facility] ENDOSCOPY;  Service: Pulmonary;;   TONSILLECTOMY  12/10/1977   VIDEO BRONCHOSCOPY WITH ENDOBRONCHIAL NAVIGATION Bilateral 04/24/2021   Procedure: VIDEO BRONCHOSCOPY WITH ENDOBRONCHIAL NAVIGATION;  Surgeon: Shelah Lamar RAMAN, MD;  Location: [facility] ENDOSCOPY;  Service: Pulmonary;  Laterality: Bilateral;    REVIEW OF SYSTEMS:  A comprehensive review of systems was negative except for: Constitutional: positive for weight loss   PHYSICAL EXAMINATION: General appearance: alert, cooperative, and no distress Head: Normocephalic, without obvious abnormality, atraumatic Neck: no adenopathy, no JVD, supple, symmetrical, trachea midline, and thyroid  not enlarged, symmetric, no tenderness/mass/nodules Lymph nodes: Cervical, supraclavicular, and axillary nodes  normal. Resp: clear to auscultation bilaterally Back: symmetric, no curvature. ROM normal. No CVA tenderness. Cardio: regular rate and rhythm, S1, S2 normal, no murmur, click, rub or gallop GI: soft, non-tender; bowel sounds normal; no masses,  no organomegaly Extremities: extremities normal, atraumatic, no cyanosis or edema  ECOG PERFORMANCE STATUS: 1 - Symptomatic but completely ambulatory  Blood pressure 134/73, pulse 95, temperature 98 F (36.7 C), temperature source Temporal, resp. rate 17, height 5' 1 (1.549 m), weight 92 lb (41.7 kg), SpO2 100%.  LABORATORY DATA: Lab Results  Component Value Date   WBC 13.2 (H) 06/24/2024   HGB 12.2 06/24/2024   HCT 38.0 06/24/2024   MCV 95.7 06/24/2024   PLT 380 06/24/2024      Chemistry      Component Value Date/Time   NA 139 06/24/2024 1010   NA 140 03/13/2021 0000   K 4.0 06/24/2024 1010   CL 105 06/24/2024 1010   CO2 25 06/24/2024 1010   BUN 21 (H) 06/24/2024 1010   BUN 10 03/13/2021 0000   CREATININE 0.91 06/24/2024 1010   GLU 107 03/13/2021 0000      Component Value Date/Time   CALCIUM 10.3 06/24/2024 1010   ALKPHOS 105 06/24/2024 1010   AST 23 06/24/2024 1010   ALT 14 06/24/2024 1010   BILITOT 0.3 06/24/2024 1010       RADIOGRAPHIC STUDIES: No results found.   ASSESSMENT AND PLAN:  This is a very pleasant 53 years old white female recently diagnosed with a stage IV non-small cell lung cancer, adenocarcinoma with no actionable mutation diagnosed in May 2022.  The patient has also history of systemic lupus erythematosus and she is not a candidate for immunotherapy. She is currently undergoing systemic chemotherapy with carboplatin  for AUC of 5, Alimta  500 Mg/M2 and Avastin  15 Mg/KG every 3 weeks status post 51 cycles.  Starting from cycle #7 the patient is on maintenance treatment with Alimta  and Avastin  every 3 weeks.   She has been tolerating this treatment fairly well. Assessment and Plan Assessment & Plan Stage  4 non-small cell lung cancer, adenocarcinoma subtype Diagnosed in May 2022 with no actionable mutations. Currently on maintenance therapy with Alimta  and Avastin  every three weeks. Reports good appetite but experiencing weight loss. Recent gallbladder check was normal. Plan to monitor thyroid  function due to weight loss. - Continue Alimta  and Avastin  maintenance therapy - Order thyroid  function tests - Schedule scan one week before next visit  Weight loss Experiencing weight loss despite good appetite and increased food intake, including healthy and calorie-dense foods. Thyroid  function to be checked as a potential contributing factor. 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 Recent blood tests indicate well-managed lupus levels. Dermatological rash attributed to lupus flare-up. Scheduled to see a rheumatologist on September 11 for further management. - Follow up with rheumatologist on September 11  Cardiac fluttering Experiencing cardiac fluttering. Prescribed metoprolol by Dr. Dewaine but has not started medication. Scheduled to see a cardiologist for further evaluation. - Follow up with cardiologist - Discuss metoprolol use with cardiologist The patient was advised to call immediately if she has any concerning symptoms in the interval.  The patient voices understanding of current disease status and treatment options and is in agreement with the current care plan.  All questions were answered. The patient knows to call the clinic with any problems, questions or concerns. We can certainly see the patient much sooner if necessary. The total time spent in the appointment was 20 minutes.  Disclaimer: This note was dictated with voice recognition software. Similar sounding words can inadvertently be transcribed and may not be corrected upon review.

## 2024-06-24 NOTE — Patient Instructions (Signed)
 [facility] - A DEPT OF [facility]  Discharge Instructions: Thank you for choosing [facility] to provide your oncology and hematology care.   If you have a lab appointment with the Cancer Center, please go directly to the Cancer Center and check in at the registration area.   Wear comfortable clothing and clothing appropriate for easy access to any Portacath or PICC line.   We strive to give you quality time with your provider. You may need to reschedule your appointment if you arrive late (15 or more minutes).  Arriving late affects you and other patients whose appointments are after yours.  Also, if you miss three or more appointments without notifying the office, you may be dismissed from the clinic at the provider's discretion.      For prescription refill requests, have your pharmacy contact our office and allow 72 hours for refills to be completed.    Today you received the following chemotherapy and/or immunotherapy agents avastin , alimta       To help prevent nausea and vomiting after your treatment, we encourage you to take your nausea medication as directed.  BELOW ARE SYMPTOMS THAT SHOULD BE REPORTED IMMEDIATELY: *FEVER GREATER THAN 100.4 F (38 C) OR HIGHER *CHILLS OR SWEATING *NAUSEA AND VOMITING THAT IS NOT CONTROLLED WITH YOUR NAUSEA MEDICATION *UNUSUAL SHORTNESS OF BREATH *UNUSUAL BRUISING OR BLEEDING *URINARY PROBLEMS (pain or burning when urinating, or frequent urination) *BOWEL PROBLEMS (unusual diarrhea, constipation, pain near the anus) TENDERNESS IN MOUTH AND THROAT WITH OR WITHOUT PRESENCE OF ULCERS (sore throat, sores in mouth, or a toothache) UNUSUAL RASH, SWELLING OR PAIN  UNUSUAL VAGINAL DISCHARGE OR ITCHING   Items with * indicate a potential emergency and should be followed up as soon as possible or go to the Emergency Department if any problems should occur.  Please show the CHEMOTHERAPY ALERT CARD or  IMMUNOTHERAPY ALERT CARD at check-in to the Emergency Department and triage nurse.  Should you have questions after your visit or need to cancel or reschedule your appointment, please contact [facility] - A DEPT OF [facility]  Dept: [phone]  and follow the prompts.  Office hours are 8:00 a.m. to 4:30 p.m. Monday - Friday. Please note that voicemails left after 4:00 p.m. may not be returned until the following business day.  We are closed weekends and major holidays. You have access to a nurse at all times for urgent questions. Please call the main number to the clinic Dept: [phone] and follow the prompts.   For any non-urgent questions, you may also contact your provider using MyChart. We now offer e-Visits for anyone 35 and older to request care online for non-urgent symptoms. For details visit [email or web address].   Also download the MyChart app! Go to the app store, search MyChart, open the app, select [place], and log in with your MyChart username and password.

## 2024-06-24 NOTE — Progress Notes (Signed)
 Pt was supposed to have dental implant placed 7/8.  Bevacizumab  removed from tx plan for 06/24/24.     However, d/t insurance issues, her dental implant is now sched for 08/07/24.   Per Dr. Sherrod, ok to add Bevacizumab  back into tx for 06/24/24.  Jerold Yoss, Pharm.D., CPP 06/24/2024@12 :21 PM

## 2024-06-30 ENCOUNTER — Ambulatory Visit

## 2024-06-30 ENCOUNTER — Other Ambulatory Visit: Payer: Self-pay

## 2024-06-30 VITALS — BP 156/96 | HR 81 | Ht 60.0 in | Wt 90.4 lb

## 2024-06-30 DIAGNOSIS — R5383 Other fatigue: Secondary | ICD-10-CM | POA: Insufficient documentation

## 2024-06-30 DIAGNOSIS — R072 Precordial pain: Secondary | ICD-10-CM

## 2024-06-30 DIAGNOSIS — Z13220 Encounter for screening for lipoid disorders: Secondary | ICD-10-CM | POA: Diagnosis not present

## 2024-06-30 DIAGNOSIS — K219 Gastro-esophageal reflux disease without esophagitis: Secondary | ICD-10-CM | POA: Insufficient documentation

## 2024-06-30 DIAGNOSIS — R002 Palpitations: Secondary | ICD-10-CM | POA: Insufficient documentation

## 2024-06-30 DIAGNOSIS — I10 Essential (primary) hypertension: Secondary | ICD-10-CM | POA: Insufficient documentation

## 2024-06-30 DIAGNOSIS — R079 Chest pain, unspecified: Secondary | ICD-10-CM | POA: Diagnosis not present

## 2024-06-30 MED ORDER — ASPIRIN 81 MG PO TBEC: 81.0000 mg | DELAYED_RELEASE_TABLET | Freq: Every day | ORAL | Status: DC

## 2024-06-30 MED ORDER — METOPROLOL TARTRATE 100 MG PO TABS: 100.0000 mg | ORAL_TABLET | Freq: Once | ORAL | 0 refills | Status: DC

## 2024-06-30 MED ORDER — CARVEDILOL 6.25 MG PO TABS: 6.2500 mg | ORAL_TABLET | Freq: Two times a day (BID) | ORAL | 3 refills | Status: AC

## 2024-06-30 NOTE — Assessment & Plan Note (Signed)
 Palpitations that she described appear more consistent with the effort intolerance and dyspnea on exertion rather than an arrhythmia related episode. Will obtain Zio patch for 14 days to rule out any significant arrhythmias or ectopy burden.

## 2024-06-30 NOTE — Assessment & Plan Note (Signed)
 Suboptimal today in the office. Current medications at home hydrochlorothiazide 25 mg once daily and amlodipine 5 mg once daily.  Target blood pressure below 130/80 mmHg. 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 Continue amlodipine 5 mg once daily. Start carvedilol  6.25 mg twice daily.

## 2024-06-30 NOTE — Assessment & Plan Note (Signed)
 Pain and dyspnea on exertion relieving with rest, with a sensation of fluttering in the chest during effort, suggest anginal equivalent.  She does have significant cardiovascular risk factors in the form of age, prior smoking history.  Abnormal EKG changes today in the office in comparison to prior EKG from [facility]-15-2024 at [facility].  - In the setting we will proceed with the evaluation for coronary artery disease. - Proceed with cardiac CT coronary angiogram. - Start aspirin  81 mg once daily. - Will start carvedilol  6.25 mg twice daily which should help with blood pressure control and also with anginal. - Will obtain transthoracic echocardiogram to rule out any significant cardiac structural and functional abnormalities.   Tentatively follow-up in the office in 6 to 8 weeks depending on scheduling of the above tests.

## 2024-06-30 NOTE — Patient Instructions (Addendum)
 Medication Instructions:  Your physician has recommended you make the following change in your medication:   Start 81 mg coated aspirin  daily  Start Coreg  6.25 mg twice daily  Stop hydrochlorothiazide   *If you need a refill on your cardiac medications before your next appointment, please call your pharmacy*   Lab Work: Your physician recommends that you return for lab work in: the next few days for CMP and fasting lipids You need to have labs done when you are fasting.  You can come Monday through Friday 8:30 am to 12:00 pm and 1:15 to 4:30. You do not need to make an appointment as the order has already been placed.  If you have labs (blood work) drawn today and your tests are completely normal, you will receive your results only by: MyChart Message (if you have MyChart) OR A paper copy in the mail If you have any lab test that is abnormal or we need to change your treatment, we will call you to review the results.  Testing/Procedures: Your physician has requested that you have an echocardiogram. Echocardiography is a painless test that uses sound waves to create images of your heart. It provides your doctor with information about the size and shape of your heart and how well your heart's chambers and valves are working. This procedure takes approximately one hour. There are no restrictions for this procedure. Please do NOT wear cologne, perfume, aftershave, or lotions (deodorant is allowed). Please arrive 15 minutes prior to your appointment time.  Please note: We ask at that you not bring children with you during ultrasound (echo/ vascular) testing. Due to room size and safety concerns, children are not allowed in the ultrasound rooms during exams. Our front office staff cannot provide observation of children in our lobby area while testing is being conducted. An adult accompanying a patient to their appointment will only be allowed in the ultrasound room at the discretion of the  ultrasound technician under special circumstances. We apologize for any inconvenience.  A zio monitor was ordered today. It will remain on for 14 days. Remove 07/14/24. You will then return monitor and event diary in provided box. It takes 1-2 weeks for report to be downloaded and returned to us . We will call you with the results. If monitor falls off or has orange flashing light, please call Zio for further instructions.    DO NOT schedule your CT until after 07/14/24 when you remove the monitor.  Your cardiac CT will be scheduled at one of the below locations:   [facility] [address] [place], [place]  Please follow these instructions carefully (unless otherwise directed):  An IV will be required for this test and Nitroglycerin will be given.    On the Night Before the Test: Be sure to Drink plenty of water. Do not consume any caffeinated/decaffeinated beverages or chocolate 12 hours prior to your test. Do not take any antihistamines 12 hours prior to your test.  On the Day of the Test: Drink plenty of water until 1 hour prior to the test. Do not eat any food 1 hour prior to test. You may take your regular medications prior to the test.  Take metoprolol  (Lopressor ) two hours prior to test. Hold your Carvedilol  (Coreg ) and take 100 mg dose the day of the CT. FEMALES- please wear underwire-free bra if available, avoid dresses & tight clothing      After the Test: Drink plenty of water. After receiving IV contrast, you may experience  a mild flushed feeling. This is normal. On occasion, you may experience a mild rash up to 24 hours after the test. This is not dangerous. If this occurs, you can take Benadryl  25 mg, Zyrtec, Claritin, or Allegra and increase your fluid intake. (Patients taking Tikosyn should avoid Benadryl , and may take Zyrtec, Claritin, or Allegra) If you experience trouble breathing, this can be serious. If it is severe call 911 IMMEDIATELY. If it is mild, please  call our office.  We will call to schedule your test 2-4 weeks out understanding that some insurance companies will need an authorization prior to the service being performed.   For more information and frequently asked questions, please visit our website : [email or web address]  For non-scheduling related questions, please contact the cardiac imaging nurse navigator should you have any questions/concerns: Cardiac Imaging Nurse Navigators Direct Office Dial: [phone]   For scheduling needs, including cancellations and rescheduling, please call [place], [phone].   Follow-Up: At [facility], you and your health needs are our priority.  As part of our continuing mission to provide you with exceptional heart care, we have created designated Provider Care Teams.  These Care Teams include your primary Cardiologist (physician) and Advanced Practice Providers (APPs -  Physician Assistants and Nurse Practitioners) who all work together to provide you with the care you need, when you need it.  We recommend signing up for the patient portal called MyChart.  Sign up information is provided on this After Visit Summary.  MyChart is used to connect with patients for Virtual Visits (Telemedicine).  Patients are able to view lab/test results, encounter notes, upcoming appointments, etc.  Non-urgent messages can be sent to your provider as well.   To learn more about what you can do with MyChart, go to [email or web address].    Your next appointment:   2 month(s)  The format for your next appointment:   In Person  Provider:   Alean Madireddy, MD   Other Instructions Echocardiogram An echocardiogram is a test that uses sound waves (ultrasound) to produce images of the heart. Images from an echocardiogram can provide important information about: Heart size and shape. The size and thickness and movement of your heart's walls. Heart muscle function and strength. Heart  valve function or if you have stenosis. Stenosis is when the heart valves are too narrow. If blood is flowing backward through the heart valves (regurgitation). A tumor or infectious growth around the heart valves. Areas of heart muscle that are not working well because of poor blood flow or injury from a heart attack. Aneurysm detection. An aneurysm is a weak or damaged part of an artery wall. The wall bulges out from the normal force of blood pumping through the body. Tell a health care provider about: Any allergies you have. All medicines you are taking, including vitamins, herbs, eye drops, creams, and over-the-counter medicines. Any blood disorders you have. Any surgeries you have had. Any medical conditions you have. Whether you are pregnant or may be pregnant. What are the risks? Generally, this is a safe test. However, problems may occur, including an allergic reaction to dye (contrast) that may be used during the test. What happens before the test? No specific preparation is needed. You may eat and drink normally. What happens during the test? You will take off your clothes from the waist up and put on a hospital gown. Electrodes or electrocardiogram (ECG)patches may be placed on your chest. The electrodes or patches are then connected to a device  that monitors your heart rate and rhythm. You will lie down on a table for an ultrasound exam. A gel will be applied to your chest to help sound waves pass through your skin. A handheld device, called a transducer, will be pressed against your chest and moved over your heart. The transducer produces sound waves that travel to your heart and bounce back (or echo back) to the transducer. These sound waves will be captured in real-time and changed into images of your heart that can be viewed on a video monitor. The images will be recorded on a computer and reviewed by your health care provider. You may be asked to change positions or hold your  breath for a short time. This makes it easier to get different views or better views of your heart. In some cases, you may receive contrast through an IV in one of your veins. This can improve the quality of the pictures from your heart. The procedure may vary among health care providers and hospitals.   What can I expect after the test? You may return to your normal, everyday life, including diet, activities, and medicines, unless your health care provider tells you not to do that. Follow these instructions at home: It is up to you to get the results of your test. Ask your health care provider, or the department that is doing the test, when your results will be ready. Keep all follow-up visits. This is important. Summary An echocardiogram is a test that uses sound waves (ultrasound) to produce images of the heart. Images from an echocardiogram can provide important information about the size and shape of your heart, heart muscle function, heart valve function, and other possible heart problems. You do not need to do anything to prepare before this test. You may eat and drink normally. After the echocardiogram is completed, you may return to your normal, everyday life, unless your health care provider tells you not to do that. This information is not intended to replace advice given to you by your health care provider. Make sure you discuss any questions you have with your health care provider. Document Revised: 07/19/2020 Document Reviewed: 07/19/2020 Elsevier Patient Education  2021 [employer]   Important Information About Sugar

## 2024-06-30 NOTE — Progress Notes (Signed)
 Cardiology Consultation:    Date:  06/30/2024   ID:  Kirsten Williams, DOB [birth year], MRN [id number]  PCP:  Kirsten Lot, PA-C  Cardiologist:  Kirsten SAUNDERS Avanish Cerullo, MD   Referring MD: Kirsten Lot, PA-C   No chief complaint on file.    ASSESSMENT AND PLAN:   Kirsten Williams 53 year old woman with history of lung cancer s/p chemotherapy [ongoing sessions every 3 weeks for the past 2 years], cutaneous lupus, hypertension, hyperlipidemia, and adrenal nodule bilateral on prior CT imaging [following up with PCP], GERD, anxiety, depression, former smoker [quit in 2016] No prior history of CAD, CHF, CVA, MI  With symptoms of progressive effort intolerance associated with a sense of palpitations during exertion appears more suggestive of angina.  Problem List Items Addressed This Visit     Palpitations - Primary   Palpitations that she described appear more consistent with the effort intolerance and dyspnea on exertion rather than an arrhythmia related episode. Will obtain Zio patch for 14 days to rule out any significant arrhythmias or ectopy burden.       Relevant Orders   EKG 12-Lead (Completed)   Hypertension, essential, benign   Suboptimal today in the office. Current medications at home hydrochlorothiazide 25 mg once daily and amlodipine 5 mg once daily.  Target blood pressure below 130/80 mmHg. 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 Continue amlodipine 5 mg once daily. Start carvedilol  6.25 mg twice daily.       Chest pain on exertion   Pain and dyspnea on exertion relieving with rest, with a sensation of fluttering in the chest during effort, suggest anginal equivalent.  She does have significant cardiovascular risk factors in the form of age, prior smoking history.  Abnormal EKG changes today in the office in comparison to prior EKG from 01-15-2024 at [facility].  - In the setting we will proceed with the  evaluation for coronary artery disease. - Proceed with cardiac CT coronary angiogram. - Start aspirin  81 mg once daily. - Will start carvedilol  6.25 mg twice daily which should help with blood pressure control and also with anginal. - Will obtain transthoracic echocardiogram to rule out any significant cardiac structural and functional abnormalities.   Tentatively follow-up in the office in 6 to 8 weeks depending on scheduling of the above tests.     Advised to avoid moderate to heavy exertion and if any significant symptoms to call 911 or head to the ER right away.  Return to clinic tentatively in 6 to 8 weeks.    History of Present Illness:    Kirsten Williams is a 53 y.o. female who is being seen today for the evaluation of palpitations at the request of Kirsten Lot, PA-C.  Pleasant woman here for the visit by herself.  Lives at home with her husband and her daughter.  Her husband works as Fish farm manager for [employer] and travels most of the week out of state.  History of lung cancer s/p chemotherapy [ongoing sessions every 3 weeks for the past 2 years], cutaneous lupus, hypertension, hyperlipidemia, and adrenal nodule bilateral on prior CT imaging [following up with PCP], GERD, anxiety, depression, former smoker [quit in 2016] No prior history of CAD, CHF, CVA, MI  Has been noticing symptoms of palpitations and PCP recently started metoprolol  succinate 25 mg once daily.  On medication reconciliation does not appear to be taking this currently  From blood pressure standpoint, had a visit to the ER in  February 2025 at [facility] for elevated blood pressures 187/99 mmHg and headache CT head was unremarkable  EKG in the clinic today shows sinus rhythm heart rate 53/min, PR interval 116 ms, QRS duration 92 ms, nonspecific ST-T changes in inferolateral leads.  In comparison to prior EKG from 01-15-2024 at [facility] had less prominent inferolateral ST-T  changes.  On further discussion she mentions symptoms of burning of feeling a sense of heart racing and fluttering when she is doing any kind of activity such as walking around the friend he had, carrying groceries into the house. Feels chest pressure heaviness like sensation with exertion. She mentions this has become more noticeable over the past 6 months. Mentions over the last 2 years she has not been physically active with her chemotherapy sessions and feels she might be deconditioned. Denies any symptoms at rest. Denies any significant palpitations or lightheadedness symptoms while at rest. No orthopnea paroxysmal nocturnal dyspnea.  No pedal edema.  Good compliance with medication.  Past Medical History:  Diagnosis Date   Adenocarcinoma of right lung (HCC) 02/15/2022   Adenocarcinoma of right lung, stage 4 (HCC) 06/14/2021   Adenocarcinoma, lung, right (HCC) 05/03/2021   Kirsten Williams is a 53 y.o. female with a history of lung nodules dating back to 2019 who is referred in consultation with Kirsten Dike, PA-C for assessment and management. She is a former smoker having quit in 2016. 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 She missed her screening CT last year and imaging was ordered last month. 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   Hypercalcemia 08/23/2022   Hypertension, essential, benign    Hypokalemia 04/22/2024   INSOMNIA NOS 02/06/2007   Qualifier: Diagnosis of   By: Kivett, Whitney         MIGRAINE, UNSPEC., W/O INTRACTABLE MIGRAINE  02/06/2007   Qualifier: Diagnosis of   By: Damien Folks      Replacing diagnoses that were inactivated after the 03/11/23 regulatory import     Osteoporosis 03/01/2022   Palpitations    Pleuritic chest pain 10/24/2022   Post-op pain 05/04/2021   Rheumatoid arthritis (HCC) 02/15/2022   Skin infection 11/13/2023   SLE (systemic lupus erythematosus) (HCC) 05/03/2021   SLE (systemic lupus erythematosus) (HCC) 05/03/2021   TOBACCO DEPENDENCE 02/06/2007   Qualifier: Diagnosis of   By: Damien Folks          Past Surgical History:  Procedure Laterality Date   ABDOMINAL HYSTERECTOMY     BRONCHIAL BIOPSY  04/24/2021   Procedure: BRONCHIAL BIOPSIES;  Surgeon: Shelah Lamar RAMAN, MD;  Location[facility] ENDOSCOPY;  Service: Pulmonary;;   BRONCHIAL BRUSHINGS  04/24/2021   Procedure: BRONCHIAL BRUSHINGS;  Surgeon: Shelah Lamar RAMAN, MD;  [facility]COPY;  Service: Pulmonary;;   BRONCHIAL NEEDLE ASPIRATION BIOPSY  04/24/2021   Procedure: BRONCHIAL NEEDLE ASPIRATION BIOPSIES;  Surgeon: Shelah Lamar RAMAN, MD;  Location: [facility] ENDOSCOPY;  Service: Pulmonary;;   BRONCHIAL WASHINGS  04/24/2021   Procedure: BRONCHIAL WASHINGS;  Surgeon: Shelah Lamar RAMAN, MD;  Location: [facility] ENDOSCOPY;  Service: Pulmonary;;   CESAREAN SECTION  11/09/1990   DILATION AND CURETTAGE OF UTERUS Bilateral 09/09/1996   FIDUCIAL MARKER PLACEMENT  04/24/2021   Procedure: FIDUCIAL MARKER PLACEMENT;  Surgeon: Shelah Lamar RAMAN, MD;  Location: [facility] ENDOSCOPY;  Service: Pulmonary;;   TONSILLECTOMY  12/10/1977   VIDEO BRONCHOSCOPY WITH ENDOBRONCHIAL NAVIGATION Bilateral 04/24/2021   Procedure: VIDEO BRONCHOSCOPY WITH ENDOBRONCHIAL NAVIGATION;  Surgeon: Shelah Lamar RAMAN, MD;  Location: [facility] ENDOSCOPY;  Service: Pulmonary;  Laterality: Bilateral;    Current Medications: Current Meds  Medication Sig   B Complex Vitamins (B COMPLEX 50 PO) Take by mouth.   Calcium Carbonate-Vit D-Min (CALCIUM 1200 PO) Take 1 capsule by mouth daily.   cholecalciferol  (VITAMIN D3) 25 MCG (1000 UNIT) tablet Take 2,000 Units by mouth daily.   CLOBETASOL PROPIONATE E 0.05 % emollient cream Apply topically.   cyclobenzaprine (FLEXERIL) 10 MG tablet Take 10 mg by mouth at bedtime as needed for muscle spasms.   denosumab  (PROLIA ) 60 MG/ML SOSY injection Inject 60 mg into the skin every 6 (six) months.   dexamethasone  (DECADRON ) 4 MG tablet 4 mg p.o. twice daily the day before, day of and day after the chemotherapy every 3 weeks.   dicyclomine (BENTYL) 10 MG capsule Take 10 mg by mouth every 6 (six) hours as needed for spasms.   diphenhydrAMINE  (BENADRYL ) 25 MG tablet Take 25 mg by mouth daily as needed for allergies.   fluticasone (FLONASE) 50 MCG/ACT nasal spray Place 2 sprays into both nostrils daily.   folic acid  (FOLVITE ) 1 MG tablet TAKE 1 TABLET(1 MG) BY MOUTH DAILY   gabapentin (NEURONTIN) 300 MG capsule Take 300 mg by mouth at bedtime.   hydrochlorothiazide (HYDRODIURIL) 25 MG tablet Take 25 mg by mouth daily.   hydrocortisone  1 % lotion Apply 1 application topically 2 (two) times daily.   hydroxychloroquine (PLAQUENIL) 200 MG tablet Take 200 mg by mouth 2 (two) times daily.   ketoconazole (NIZORAL) 2 % shampoo SMARTSIG:Topical 2-3 Times Weekly   Multiple Vitamins-Minerals (MULTI ADULT GUMMIES) CHEW Chew 2 tablets by mouth daily.   omeprazole (PRILOSEC) 40 MG capsule Take 40 mg by mouth daily as needed (acid reflux).   oxyCODONE -acetaminophen  (PERCOCET/ROXICET) 5-325 MG tablet Take 1 tablet by mouth every 8 (eight) hours as needed for severe pain (pain score 7-10).   potassium chloride  20 MEQ/15ML (10%) SOLN Take 15 mLs (20 mEq total) by mouth daily.   potassium chloride  SA (KLOR-CON  M) 20 MEQ tablet Take 1 tablet (20 mEq total) by mouth 2 (two) times daily.   Probiotic Product (PROBIOTIC-10 PO) Take 1 tablet by mouth daily.   prochlorperazine  (COMPAZINE ) 10 MG tablet TAKE 1 TABLET(10 MG) BY MOUTH EVERY 6 HOURS AS NEEDED   rizatriptan (MAXALT-MLT) 10 MG  disintegrating tablet Take 10 mg by mouth 2 (two) times daily as needed for migraine.   tetrahydrozoline 0.05 % ophthalmic solution Place 1 drop into both eyes once. Systane Eye Drops once daily both eyes   triamcinolone ointment (KENALOG) 0.5 % Apply 1 Application topically 3 (three) times daily.   [DISCONTINUED] amLODipine (NORVASC) 5 MG tablet Take 5 mg by mouth daily.   [DISCONTINUED] famotidine  (PEPCID ) 20 MG tablet Take 20 mg by mouth at bedtime.     Allergies:   Clindamycin/lincomycin, Losartan, and Carboplatin    Social History   Socioeconomic History   Marital status: Married    Spouse name: Not on file   Number of children: Not on file   Years  of education: Not on file   Highest education level: Not on file  Occupational History   Not on file  Tobacco Use   Smoking status: Former    Current packs/day: 0.00    Average packs/day: 1.5 packs/day for 32.0 years (48.0 ttl pk-yrs)    Types: Cigarettes    Start date: 12/10/1982    Quit date: 12/10/2014    Years since quitting: 9.5   Smokeless tobacco: Never  Vaping Use   Vaping status: Never Used  Substance and Sexual Activity   Alcohol use: Yes    Alcohol/week: 3.0 standard drinks of alcohol    Types: 3 Shots of liquor per week    Comment: daily   Drug use: Never   Sexual activity: Yes  Other Topics Concern   Not on file  Social History Narrative   Right Handed    Lives in a two story home. Lives with husband.   Social Drivers of Corporate investment banker Strain: Not on file  Food Insecurity: Not on file  Transportation Needs: Not on file  Physical Activity: Not on file  Stress: Not on file  Social Connections: Not on file     Family History: The patient's family history includes Cancer in her father; Heart Problems in her mother; Hypertension in her sister; Neuropathy in her father; Stroke in her father and mother. ROS:   Please see the history of present illness.    All 14 point review of systems negative  except as described per history of present illness.  EKGs/Labs/Other Studies Reviewed:    The following studies were reviewed today:   EKG:  EKG Interpretation Date/Time:  Tuesday June 30 2024 15:00:17 EDT Ventricular Rate:  81 PR Interval:  116 QRS Duration:  92 QT Interval:  378 QTC Calculation: 439 R Axis:   76  Text Interpretation: Normal sinus rhythm Nonspecific ST abnormality Abnormal ECG No previous ECGs available Confirmed by Liborio Hai reddy [phone]) on 06/30/2024 3:23:56 PM    Recent Labs: 06/24/2024: ALT 14; BUN 21; Creatinine 0.91; Hemoglobin 12.2; Platelet Count 380; Potassium 4.0; Sodium 139  Recent Lipid Panel No results found for: CHOL, TRIG, HDL, CHOLHDL, VLDL, LDLCALC, LDLDIRECT  Physical Exam:    VS:  BP (!) 156/96   Pulse 81   Ht 5' (1.524 m)   Wt 90 lb 6.1 oz (41 kg)   SpO2 99%   BMI 17.65 kg/m     Wt Readings from Last 3 Encounters:  06/30/24 90 lb 6.1 oz (41 kg)  06/24/24 92 lb (41.7 kg)  06/03/24 95 lb 6.4 oz (43.3 kg)     GENERAL:  Well nourished, well developed in no acute distress NECK: No JVD; No carotid bruits CARDIAC: RRR, S1 and S2 present, no murmurs, no rubs, no gallops CHEST:  Clear to auscultation without rales, wheezing or rhonchi  Extremities: No pitting pedal edema. Pulses bilaterally symmetric with radial 2+ and dorsalis pedis 2+ NEUROLOGIC:  Alert and oriented x 3  Medication Adjustments/Labs and Tests Ordered: Current medicines are reviewed at length with the patient today.  Concerns regarding medicines are outlined above.  Orders Placed This Encounter  Procedures   EKG 12-Lead   No orders of the defined types were placed in this encounter.   Signed, Hai jess Liborio, MD, MPH, [facility]. 06/30/2024 3:56 PM    [facility]

## 2024-07-01 ENCOUNTER — Other Ambulatory Visit: Payer: Self-pay

## 2024-07-02 ENCOUNTER — Other Ambulatory Visit: Payer: Self-pay

## 2024-07-02 ENCOUNTER — Encounter: Payer: Self-pay | Admitting: Internal Medicine

## 2024-07-02 LAB — COMPREHENSIVE METABOLIC PANEL WITH GFR
ALT: 25 IU/L (ref 0–32)
AST: 30 IU/L (ref 0–40)
Albumin: 4 g/dL (ref 3.8–4.9)
Alkaline Phosphatase: 115 IU/L (ref 44–121)
BUN/Creatinine Ratio: 31 — ABNORMAL HIGH (ref 9–23)
BUN: 29 mg/dL — ABNORMAL HIGH (ref 6–24)
Bilirubin Total: 0.3 mg/dL (ref 0.0–1.2)
CO2: 19 mmol/L — ABNORMAL LOW (ref 20–29)
Calcium: 9.7 mg/dL (ref 8.7–10.2)
Chloride: 103 mmol/L (ref 96–106)
Creatinine, Ser: 0.94 mg/dL (ref 0.57–1.00)
Globulin, Total: 2.9 g/dL (ref 1.5–4.5)
Glucose: 86 mg/dL (ref 70–99)
Potassium: 3.6 mmol/L (ref 3.5–5.2)
Sodium: 142 mmol/L (ref 134–144)
Total Protein: 6.9 g/dL (ref 6.0–8.5)
eGFR: 73 mL/min/1.73 (ref >59)

## 2024-07-02 LAB — LIPID PANEL
Chol/HDL Ratio: 3.5 ratio (ref 0.0–4.4)
Cholesterol, Total: 253 mg/dL — ABNORMAL HIGH (ref 100–199)
HDL: 73 mg/dL (ref >39)
LDL Chol Calc (NIH): 153 mg/dL — ABNORMAL HIGH (ref 0–99)
Triglycerides: 153 mg/dL — ABNORMAL HIGH (ref 0–149)
VLDL Cholesterol Cal: 27 mg/dL (ref 5–40)

## 2024-07-07 ENCOUNTER — Telehealth: Payer: Self-pay

## 2024-07-07 NOTE — Telephone Encounter (Signed)
 Advised to return the monitor to Zio as she states that she cannot keep the monitor on due to excessive sweating. Pt verbalized understanding and had no additional questions.

## 2024-07-07 NOTE — Telephone Encounter (Signed)
  1. Is this related to a heart monitor you are wearing?  (If the patient says no, please ask     if they are caling about ICD/pacemaker.) Heart Monitor  2. What is your issue?? It will not stay stuck to her skin. And she keeps forgetting to push the button   Please route to covering RN/CMA/RMA for results. Route to monitor technicians or your monitor tech representative for your site for any technical concerns

## 2024-07-08 ENCOUNTER — Other Ambulatory Visit: Payer: Self-pay | Admitting: Internal Medicine

## 2024-07-11 NOTE — Progress Notes (Unsigned)
 [facility] OFFICE PROGRESS NOTE  Kirsten Lot, PA-C [address] [place] [address]  DIAGNOSIS: Stage IV (T4, N2, M1 a) non-small cell lung cancer, adenocarcinoma presented with multifocal disease involving the right upper lobe, right lower lobe as well as suspicious lower paratracheal lymphadenopathy and groundglass nodules in the left lung diagnosed in May 2022. The patient had molecular studies performed by [facility] [employer] and that showed no detectable mutation but this is likely secondary to low-level circulating free tumor DNA   PRIOR THERAPY: None  CURRENT THERAPY: Palliative systemic chemotherapy with carboplatin  for AUC of 5, Alimta  500 Mg/M2 and Avastin  15 Mg/KG every 3 weeks.  The patient is not a great candidate for immunotherapy in the event that she has a genetic mutation.  She is status post 52 cycles. Starting from cycle #6, the patient will start maintenance Alimta  and Avastin .    INTERVAL HISTORY: Kirsten Williams 53 y.o. female returns to the clinic today for a follow up visit. The patient was last see in the clinic by Dr. Sherrod 3 weeks ago. She is currently on treatment with Alimta  and avastin .   The patient is currently seeing cardiology for her palpitations.  She had a heart monitor.  She scheduled for CT coronary scan on 8/21.  Also getting an echocardiogram.  She is seeing a rheumatologist for her lupus flareup on 08/20/2024.  She is also going to go to [place]  in October.  She is scheduled for her dental implant tentatively for 08/27/2024 which is contingent on her cardiology clearance.  She experiences heart palpitations and shortness of breath, particularly noticeable during physical activity such as walking her dog. She needs to stop and rest due to feeling faint and being out of breath. She has been using a heart monitor and has had episodes of supraventricular tachycardia (SVT), with the longest episode lasting seven beats. No prolonged episodes of  palpitations are reported. She is on coreg .   She tolerates her treatment well.  She denies any fever. She does get cold from time to time. She gained 2 lbs. On a regular basis after treatment she does experience a bandlike sensation around her diaphragm which is unchanged.  She denies any chest pain. She denies any nausea or vomiting.  She denies any diarrhea or constipation. She denies any abnormal bleeding or bruising.  She is here today for evaluation and repeat blood work before undergoing cycle #53.     MEDICAL HISTORY: Past Medical History:  Diagnosis Date   Adenocarcinoma of right lung (HCC) 02/15/2022   Adenocarcinoma of right lung, stage 4 (HCC) 06/14/2021   Adenocarcinoma, lung, right (HCC) 05/03/2021   Kirsten Williams is a 53 y.o. female with a history of lung nodules dating back to 2019 who is referred in consultation with Williams Montey, PA-C for assessment and management. She is a former smoker having quit in 2016. 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 She missed her screening CT last year and imaging was ordered last month. 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/16/2023   Hypercalcemia 08/23/2022   Hypertension, essential, benign    Hypokalemia 04/22/2024   INSOMNIA NOS 02/06/2007   Qualifier: Diagnosis of   By: Damien Folks         MIGRAINE, UNSPEC., W/O INTRACTABLE MIGRAINE 02/06/2007   Qualifier: Diagnosis of   By: Damien Folks      Replacing diagnoses that were inactivated after the 03/11/23 regulatory import     Osteoporosis 03/01/2022    Palpitations    Pleuritic chest pain 10/24/2022   Post-op pain 05/04/2021   Rheumatoid arthritis (HCC) 02/15/2022   Skin infection 11/13/2023   SLE (systemic lupus erythematosus) (HCC) 05/03/2021   SLE (systemic lupus erythematosus) (HCC) 05/03/2021   TOBACCO DEPENDENCE 02/06/2007   Qualifier: Diagnosis of   By: Damien Folks          ALLERGIES:  is allergic to clindamycin/lincomycin, losartan, and carboplatin .  MEDICATIONS:  Current Outpatient Medications  Medication Sig Dispense Refill   amLODipine (NORVASC) 10 MG tablet Take 10 mg by mouth daily.     aspirin  EC 81 MG tablet Take 1 tablet (81 mg total) by mouth daily. Swallow whole.     B Complex Vitamins (B COMPLEX 50 PO) Take by mouth.     Calcium Carbonate-Vit D-Min (CALCIUM 1200 PO) Take 1 capsule by mouth daily.     carvedilol  (COREG ) 6.25 MG tablet Take 1 tablet (6.25 mg total) by mouth 2 (two) times daily. 180 tablet 3   cholecalciferol (VITAMIN D3) 25 MCG (1000 UNIT) tablet Take 2,000 Units by mouth daily.     CLOBETASOL PROPIONATE E 0.05 % emollient cream Apply topically.     cyclobenzaprine (FLEXERIL) 10 MG tablet Take 10 mg by mouth at bedtime as needed for muscle spasms.     denosumab  (PROLIA ) 60 MG/ML SOSY injection Inject 60 mg into the skin every 6 (six) months.     dexamethasone  (DECADRON ) 4 MG tablet 4 mg p.o. twice daily the day before, day of and day after the chemotherapy every 3 weeks. 40 tablet 2   dicyclomine (BENTYL) 10 MG capsule Take 10 mg by mouth every 6 (six) hours as needed for spasms.     diphenhydrAMINE  (BENADRYL ) 25 MG tablet Take 25 mg by mouth daily as needed for allergies.     famotidine  (PEPCID ) 40 MG tablet Take 40 mg by mouth daily.     fluticasone (FLONASE) 50 MCG/ACT nasal spray Place 2 sprays into both nostrils daily.     folic acid  (FOLVITE ) 1 MG tablet TAKE 1 TABLET(1 MG) BY MOUTH DAILY 30 tablet 4   gabapentin (NEURONTIN) 300 MG capsule Take 300 mg by mouth at bedtime.      hydrocortisone  1 % lotion Apply 1 application topically 2 (two) times daily. 118 mL 0   hydroxychloroquine (PLAQUENIL) 200 MG tablet Take 200 mg by mouth 2 (two) times daily.     ketoconazole (NIZORAL) 2 % shampoo SMARTSIG:Topical 2-3 Times Weekly     metoprolol  tartrate (LOPRESSOR ) 100 MG tablet Take 1 tablet (100 mg total) by mouth once for 1 dose. Take 2 hours prior to your CT if your heart rate is greater than 55 1 tablet 0   Multiple Vitamins-Minerals (MULTI ADULT GUMMIES) CHEW Chew 2 tablets by mouth daily.     omeprazole (PRILOSEC) 40 MG capsule Take 40 mg by mouth daily as needed (acid reflux).     oxyCODONE -acetaminophen  (PERCOCET/ROXICET) 5-325 MG tablet Take 1 tablet by mouth every 8 (eight) hours as needed for severe  pain (pain score 7-10). 30 tablet 0   potassium chloride  20 MEQ/15ML (10%) SOLN Take 15 mLs (20 mEq total) by mouth daily. 105 mL 0   potassium chloride  SA (KLOR-CON  M) 20 MEQ tablet Take 1 tablet (20 mEq total) by mouth 2 (two) times daily. 20 tablet 0   Probiotic Product (PROBIOTIC-10 PO) Take 1 tablet by mouth daily.     prochlorperazine  (COMPAZINE ) 10 MG tablet TAKE 1 TABLET(10 MG) BY MOUTH EVERY 6 HOURS AS NEEDED 30 tablet 2   rizatriptan (MAXALT-MLT) 10 MG disintegrating tablet Take 10 mg by mouth 2 (two) times daily as needed for migraine.     tetrahydrozoline 0.05 % ophthalmic solution Place 1 drop into both eyes once. Systane Eye Drops once daily both eyes     triamcinolone ointment (KENALOG) 0.5 % Apply 1 Application topically 3 (three) times daily.     No current facility-administered medications for this visit.    SURGICAL HISTORY:  Past Surgical History:  Procedure Laterality Date   ABDOMINAL HYSTERECTOMY     BRONCHIAL BIOPSY  04/24/2021   Procedure: BRONCHIAL BIOPSIES;  Surgeon: Shelah Lamar RAMAN, MD;  Location[facility] ENDOSCOPY;  Service: Pulmonary;;   BRONCHIAL BRUSHINGS  04/24/2021   Procedure: BRONCHIAL BRUSHINGS;  Surgeon: Shelah Lamar RAMAN, MD;  Location:  [facility] ENDOSCOPY;  Service: Pulmonary;;   BRONCHIAL NEEDLE ASPIRATION BIOPSY  04/24/2021   Procedure: BRONCHIAL NEEDLE ASPIRATION BIOPSIES;  Surgeon: Shelah Lamar RAMAN, MD;  Location: [facility] ENDOSCOPY;  Service: Pulmonary;;   BRONCHIAL WASHINGS  04/24/2021   Procedure: BRONCHIAL WASHINGS;  Surgeon: Shelah Lamar RAMAN, MD;  Location: [facility] ENDOSCOPY;  Service: Pulmonary;;   CESAREAN SECTION  11/09/1990   DILATION AND CURETTAGE OF UTERUS Bilateral 09/09/1996   FIDUCIAL MARKER PLACEMENT  04/24/2021   Procedure: FIDUCIAL MARKER PLACEMENT;  Surgeon: Shelah Lamar RAMAN, MD;  Location: [facility] ENDOSCOPY;  Service: Pulmonary;;   TONSILLECTOMY  12/10/1977   VIDEO BRONCHOSCOPY WITH ENDOBRONCHIAL NAVIGATION Bilateral 04/24/2021   Procedure: VIDEO BRONCHOSCOPY WITH ENDOBRONCHIAL NAVIGATION;  Surgeon: Shelah Lamar RAMAN, MD;  Location: [facility] ENDOSCOPY;  Service: Pulmonary;  Laterality: Bilateral;    REVIEW OF SYSTEMS:   Constitutional: Positive for stable fatigue. Negative for appetite change, chills,  and fever.  HENT: Negative for mouth sores, nosebleeds, sore throat and trouble swallowing.   Eyes: Negative for eye problems and icterus.  Respiratory: Positive for occasional shortness of breath. Negative for cough, hemoptysis, and wheezing.   Cardiovascular: Negative for chest pain (gets chest tightness routinely after treatment but none at this time). Mild ankle swelling.  Gastrointestinal: Positive for intermittent chronic abdominal bloating, nausea, and early satiety (improved compared to prior and none reported today). Negative for constipation, diarrhea, and vomiting.  Genitourinary: Negative for bladder incontinence, difficulty urinating, dysuria, frequency and hematuria.   Musculoskeletal: Negative for back pain, gait problem, neck pain and neck stiffness.  Skin: Positive for rash on chest, arms, and back. Neurological: Negative for dizziness, extremity weakness, gait problem, headaches, and seizures.  Hematological: Negative for  adenopathy. Does not bruise/bleed easily.  Psychiatric/Behavioral: Negative for confusion, depression and sleep disturbance. The patient is not nervous/anxious.       PHYSICAL EXAMINATION:  Blood pressure (!) 162/86, pulse (!) 111, temperature 97.8 F (36.6 C), temperature source Temporal, resp. rate 16, weight 92 lb 3.2 oz (41.8 kg), SpO2 97%.  ECOG PERFORMANCE STATUS: 1  Physical Exam  Constitutional: Oriented to person, place, and time and well-developed, well-nourished, and in no distress.  HENT:  Head: Normocephalic and atraumatic.  Mouth/Throat:  Oropharynx is clear and moist. No oropharyngeal exudate.  Eyes: Conjunctivae are normal. Right eye exhibits no discharge. Left eye exhibits no discharge. No scleral icterus.  Neck: Normal range of motion. Neck supple.  Cardiovascular: Normal rate, regular rhythm, normal heart sounds and intact distal pulses.   Pulmonary/Chest: Effort normal and breath sounds normal. No respiratory distress. No wheezes. No rales.  Abdominal: Soft. Bowel sounds are normal. Exhibits no distension and no mass. There is no tenderness.  Musculoskeletal: Normal range of motion. Exhibits no edema.  Lymphadenopathy:    No cervical adenopathy.  Neurological: Alert and oriented to person, place, and time. Exhibits normal muscle tone. Gait normal. Coordination normal.  Skin: Skin is warm and dry. Positive for rash on arms, chest, and back. Not diaphoretic. No erythema. No pallor.  Psychiatric: Mood, memory and judgment normal.  Vitals reviewed.    LABORATORY DATA: Lab Results  Component Value Date   WBC 11.6 (H) 07/15/2024   HGB 11.0 (L) 07/15/2024   HCT 34.5 (L) 07/15/2024   MCV 97.2 07/15/2024   PLT 400 07/15/2024      Chemistry      Component Value Date/Time   NA 140 07/15/2024 0933   NA 142 07/01/2024 0902   K 3.3 (L) 07/15/2024 0933   CL 105 07/15/2024 0933   CO2 23 07/15/2024 0933   BUN 12 07/15/2024 0933   BUN 29 (H) 07/01/2024 0902    CREATININE 0.84 07/15/2024 0933   GLU 107 03/13/2021 0000      Component Value Date/Time   CALCIUM 9.5 07/15/2024 0933   ALKPHOS 119 07/15/2024 0933   AST 21 07/15/2024 0933   ALT 14 07/15/2024 0933   BILITOT 0.3 07/15/2024 0933       RADIOGRAPHIC STUDIES:  No results found.   ASSESSMENT/PLAN:  This is a very pleasant 53 year old Caucasian female diagnosed with stage IV (T4, N2, M1 a) non-small cell lung cancer, adenocarcinoma presented with multifocal disease involving the right upper lobe, right lower lobe as well as suspicious lower paratracheal lymphadenopathy and groundglass nodules in the left lung diagnosed in May 2022. The patient had molecular studies performed by [facility] [employer] and that showed no detectable mutation but this is likely secondary to low-level circulating free tumor DNA. If the patient has disease progression in the future, then we will likely retest her for molecular studies.      The patient is currently undergoing systemic chemotherapy with carboplatin  for an AUC 5, Alimta  500 mg per metered square, and and Avastin  15 mg/kg IV every 3 weeks.  She is status post 52 cycles.  Starting from cycle #7, the patient has been on maintenance treatment with Alimta  and Avastin .   Labs were reviewed.We will hold her avastin  today due to elevated urine protein and BP. Recommend that she proceed with cycle #53 today as scheduled with only alimta .  Will see her back for labs and a follow-up visit in 3 weeks for evaluation and repeat blood work before undergoing cycle #54.   Will arrange for restaging CT scan of the chest, abdomen, pelvis prior to her next cycle of treatment.  She can check with radiology if this is performed on the same CT scanner as her CT coronary which is scheduled for 07/30/2024.  At her next appointment Dr. Sherrod, she will have more information about her dental procedure that is tentatively scheduled for 08/27/2024.  She can talk to Dr. Sherrod if she  needs to defer her 08/26/2024 appointment.  Additionally she  will be going to [place]  in October and we can adjust her schedule at her next appointment.    Will continue to with cardiology regarding her SVT and echocardiogram.  He has very mild ankle swelling for which she is advised to elevate and use compression stockings.  She is on amlodipine and Coreg .  She will continue cardiology.  The patient was advised to call immediately if she has any concerning symptoms in the interval. The patient voices understanding of current disease status and treatment options and is in agreement with the current care plan. All questions were answered. The patient knows to call the clinic with any problems, questions or concerns. We can certainly see the patient much sooner if necessary     No orders of the defined types were placed in this encounter.   The total time spent in the appointment was 20-29 minutes  Alexcis Bicking L Gedalya Jim, PA-C 07/15/24

## 2024-07-15 ENCOUNTER — Inpatient Hospital Stay: Attending: Hematology and Oncology | Admitting: Physician Assistant

## 2024-07-15 ENCOUNTER — Inpatient Hospital Stay: Attending: Hematology and Oncology

## 2024-07-15 VITALS — BP 162/86 | HR 111 | Temp 97.8°F | Resp 16 | Wt 92.2 lb

## 2024-07-15 DIAGNOSIS — Z5112 Encounter for antineoplastic immunotherapy: Secondary | ICD-10-CM | POA: Insufficient documentation

## 2024-07-15 DIAGNOSIS — I471 Supraventricular tachycardia, unspecified: Secondary | ICD-10-CM | POA: Diagnosis not present

## 2024-07-15 DIAGNOSIS — C3411 Malignant neoplasm of upper lobe, right bronchus or lung: Secondary | ICD-10-CM | POA: Diagnosis present

## 2024-07-15 DIAGNOSIS — Z5111 Encounter for antineoplastic chemotherapy: Secondary | ICD-10-CM | POA: Insufficient documentation

## 2024-07-15 DIAGNOSIS — Z79899 Other long term (current) drug therapy: Secondary | ICD-10-CM | POA: Insufficient documentation

## 2024-07-15 DIAGNOSIS — C3491 Malignant neoplasm of unspecified part of right bronchus or lung: Secondary | ICD-10-CM

## 2024-07-15 DIAGNOSIS — C3431 Malignant neoplasm of lower lobe, right bronchus or lung: Secondary | ICD-10-CM | POA: Insufficient documentation

## 2024-07-15 LAB — CMP (CANCER CENTER ONLY)
ALT: 14 U/L (ref 0–44)
AST: 21 U/L (ref 15–41)
Albumin: 3.5 g/dL (ref 3.5–5.0)
Alkaline Phosphatase: 119 U/L (ref 38–126)
Anion gap: 12 (ref 5–15)
BUN: 12 mg/dL (ref 6–20)
CO2: 23 mmol/L (ref 22–32)
Calcium: 9.5 mg/dL (ref 8.9–10.3)
Chloride: 105 mmol/L (ref 98–111)
Creatinine: 0.84 mg/dL (ref 0.44–1.00)
GFR, Estimated: >60 mL/min (ref >60)
Glucose, Bld: 118 mg/dL — ABNORMAL HIGH (ref 70–99)
Potassium: 3.3 mmol/L — ABNORMAL LOW (ref 3.5–5.1)
Sodium: 140 mmol/L (ref 135–145)
Total Bilirubin: 0.3 mg/dL (ref 0.0–1.2)
Total Protein: 7.2 g/dL (ref 6.5–8.1)

## 2024-07-15 LAB — CBC WITH DIFFERENTIAL (CANCER CENTER ONLY)
Abs Immature Granulocytes: 0.1 K/uL — ABNORMAL HIGH (ref 0.00–0.07)
Basophils Absolute: 0.1 K/uL (ref 0.0–0.1)
Basophils Relative: 1 %
Eosinophils Absolute: 0.2 K/uL (ref 0.0–0.5)
Eosinophils Relative: 2 %
HCT: 34.5 % — ABNORMAL LOW (ref 36.0–46.0)
Hemoglobin: 11 g/dL — ABNORMAL LOW (ref 12.0–15.0)
Immature Granulocytes: 1 %
Lymphocytes Relative: 22 %
Lymphs Abs: 2.5 K/uL (ref 0.7–4.0)
MCH: 31 pg (ref 26.0–34.0)
MCHC: 31.9 g/dL (ref 30.0–36.0)
MCV: 97.2 fL (ref 80.0–100.0)
Monocytes Absolute: 1.6 K/uL — ABNORMAL HIGH (ref 0.1–1.0)
Monocytes Relative: 14 %
Neutro Abs: 7.2 K/uL (ref 1.7–7.7)
Neutrophils Relative %: 60 %
Platelet Count: 400 K/uL (ref 150–400)
RBC: 3.55 MIL/uL — ABNORMAL LOW (ref 3.87–5.11)
RDW: 20.5 % — ABNORMAL HIGH (ref 11.5–15.5)
WBC Count: 11.6 K/uL — ABNORMAL HIGH (ref 4.0–10.5)
nRBC: 0 % (ref 0.0–0.2)

## 2024-07-15 LAB — TOTAL PROTEIN, URINE DIPSTICK: Protein, ur: 300 mg/dL — AB

## 2024-07-15 MED ORDER — DEXAMETHASONE SODIUM PHOSPHATE 10 MG/ML IJ SOLN
10.0000 mg | Freq: Once | INTRAMUSCULAR | Status: AC
  Administered 2024-07-15: 10 mg via INTRAVENOUS
  Filled 2024-07-15: qty 1

## 2024-07-15 MED ORDER — PROCHLORPERAZINE MALEATE 10 MG PO TABS
10.0000 mg | ORAL_TABLET | Freq: Once | ORAL | Status: AC
  Administered 2024-07-15: 10 mg via ORAL
  Filled 2024-07-15: qty 1

## 2024-07-15 MED ORDER — SODIUM CHLORIDE 0.9 % IV SOLN
Freq: Once | INTRAVENOUS | Status: AC
Start: 2024-07-15 — End: 2024-07-15

## 2024-07-15 MED ORDER — SODIUM CHLORIDE 0.9 % IV SOLN
500.0000 mg/m2 | Freq: Once | INTRAVENOUS | Status: AC
  Administered 2024-07-15: 700 mg via INTRAVENOUS
  Filled 2024-07-15: qty 20

## 2024-07-15 MED ORDER — CYANOCOBALAMIN 1000 MCG/ML IJ SOLN
1000.0000 ug | Freq: Once | INTRAMUSCULAR | Status: AC
  Administered 2024-07-15: 1000 ug via INTRAMUSCULAR
  Filled 2024-07-15: qty 1

## 2024-07-15 NOTE — Progress Notes (Signed)
 Per Cassie PA. Hold Bevacizumab  due to elevated urine protein. OK to proceed with Alimta  with elevated HR

## 2024-07-19 ENCOUNTER — Encounter (HOSPITAL_BASED_OUTPATIENT_CLINIC_OR_DEPARTMENT_OTHER): Payer: Self-pay | Admitting: Radiology

## 2024-07-22 ENCOUNTER — Other Ambulatory Visit: Payer: Self-pay

## 2024-07-24 ENCOUNTER — Ambulatory Visit

## 2024-07-24 DIAGNOSIS — R079 Chest pain, unspecified: Secondary | ICD-10-CM

## 2024-07-24 LAB — ECHOCARDIOGRAM COMPLETE
Area-P 1/2: 4.31 cm2
S' Lateral: 2.7 cm

## 2024-07-27 ENCOUNTER — Encounter (HOSPITAL_BASED_OUTPATIENT_CLINIC_OR_DEPARTMENT_OTHER): Payer: Self-pay | Admitting: Radiology

## 2024-07-27 ENCOUNTER — Ambulatory Visit: Payer: Self-pay

## 2024-07-27 ENCOUNTER — Ambulatory Visit (INDEPENDENT_AMBULATORY_CARE_PROVIDER_SITE_OTHER)
Admission: RE | Admit: 2024-07-27 | Discharge: 2024-07-27 | Disposition: A | Discharge status: Home / Self Care | Source: Ambulatory Visit | Attending: Physician Assistant | Admitting: Physician Assistant

## 2024-07-27 DIAGNOSIS — C3491 Malignant neoplasm of unspecified part of right bronchus or lung: Secondary | ICD-10-CM | POA: Diagnosis not present

## 2024-07-27 DIAGNOSIS — R002 Palpitations: Secondary | ICD-10-CM | POA: Diagnosis not present

## 2024-07-27 MED ORDER — IOHEXOL 300 MG/ML  SOLN
100.0000 mL | Freq: Once | INTRAMUSCULAR | Status: AC | PRN
  Administered 2024-07-27: 80 mL via INTRAVENOUS

## 2024-07-28 ENCOUNTER — Encounter (HOSPITAL_COMMUNITY): Payer: Self-pay

## 2024-07-29 ENCOUNTER — Telehealth (HOSPITAL_COMMUNITY): Payer: Self-pay | Admitting: *Deleted

## 2024-07-29 NOTE — Telephone Encounter (Signed)
 Attempted to call patient regarding upcoming cardiac CT appointment. Left message on voicemail with name and callback number  Larey Brick RN Navigator Cardiac Imaging [facility] [phone] Office [phone] Cell

## 2024-07-30 ENCOUNTER — Ambulatory Visit (INDEPENDENT_AMBULATORY_CARE_PROVIDER_SITE_OTHER)
Admission: RE | Admit: 2024-07-30 | Discharge: 2024-07-30 | Disposition: A | Discharge status: Home / Self Care | Source: Ambulatory Visit

## 2024-07-30 DIAGNOSIS — R079 Chest pain, unspecified: Secondary | ICD-10-CM

## 2024-07-30 DIAGNOSIS — R072 Precordial pain: Secondary | ICD-10-CM

## 2024-07-30 MED ORDER — IOHEXOL 350 MG/ML SOLN
95.0000 mL | Freq: Once | INTRAVENOUS | Status: AC | PRN
  Administered 2024-07-30: 95 mL via INTRAVENOUS

## 2024-07-30 MED ORDER — NITROGLYCERIN 0.4 MG SL SUBL
0.8000 mg | SUBLINGUAL_TABLET | Freq: Once | SUBLINGUAL | Status: AC
  Administered 2024-07-30: 0.8 mg via SUBLINGUAL

## 2024-07-30 MED ORDER — METOPROLOL TARTRATE 5 MG/5ML IV SOLN: 10.0000 mg | Freq: Once | INTRAVENOUS | Status: DC | PRN

## 2024-07-30 MED ORDER — DILTIAZEM HCL 25 MG/5ML IV SOLN: 10.0000 mg | INTRAVENOUS | Status: DC | PRN

## 2024-08-03 ENCOUNTER — Telehealth: Payer: Self-pay

## 2024-08-03 NOTE — Telephone Encounter (Signed)
 Pt viewed CT Angio results on My Chart per Dr.Madireddy's's note. Routed to PCP.

## 2024-08-05 ENCOUNTER — Inpatient Hospital Stay: Admitting: Internal Medicine

## 2024-08-05 ENCOUNTER — Telehealth: Payer: Self-pay

## 2024-08-05 ENCOUNTER — Inpatient Hospital Stay

## 2024-08-05 ENCOUNTER — Other Ambulatory Visit: Payer: Self-pay | Admitting: Physician Assistant

## 2024-08-05 VITALS — BP 127/75 | HR 86 | Temp 97.8°F | Resp 17 | Ht 60.0 in | Wt 92.0 lb

## 2024-08-05 DIAGNOSIS — C3491 Malignant neoplasm of unspecified part of right bronchus or lung: Secondary | ICD-10-CM

## 2024-08-05 DIAGNOSIS — E876 Hypokalemia: Secondary | ICD-10-CM

## 2024-08-05 DIAGNOSIS — Z5112 Encounter for antineoplastic immunotherapy: Secondary | ICD-10-CM | POA: Diagnosis not present

## 2024-08-05 LAB — CMP (CANCER CENTER ONLY)
ALT: 11 U/L (ref 0–44)
AST: 20 U/L (ref 15–41)
Albumin: 3.3 g/dL — ABNORMAL LOW (ref 3.5–5.0)
Alkaline Phosphatase: 106 U/L (ref 38–126)
Anion gap: 11 (ref 5–15)
BUN: 9 mg/dL (ref 6–20)
CO2: 25 mmol/L (ref 22–32)
Calcium: 8.5 mg/dL — ABNORMAL LOW (ref 8.9–10.3)
Chloride: 105 mmol/L (ref 98–111)
Creatinine: 0.84 mg/dL (ref 0.44–1.00)
GFR, Estimated: >60 mL/min (ref >60)
Glucose, Bld: 145 mg/dL — ABNORMAL HIGH (ref 70–99)
Potassium: 2.9 mmol/L — ABNORMAL LOW (ref 3.5–5.1)
Sodium: 141 mmol/L (ref 135–145)
Total Bilirubin: 0.3 mg/dL (ref 0.0–1.2)
Total Protein: 6.7 g/dL (ref 6.5–8.1)

## 2024-08-05 LAB — CBC WITH DIFFERENTIAL (CANCER CENTER ONLY)
Abs Immature Granulocytes: 0.06 K/uL (ref 0.00–0.07)
Basophils Absolute: 0.1 K/uL (ref 0.0–0.1)
Basophils Relative: 1 %
Eosinophils Absolute: 0.2 K/uL (ref 0.0–0.5)
Eosinophils Relative: 2 %
HCT: 33.4 % — ABNORMAL LOW (ref 36.0–46.0)
Hemoglobin: 10.7 g/dL — ABNORMAL LOW (ref 12.0–15.0)
Immature Granulocytes: 1 %
Lymphocytes Relative: 22 %
Lymphs Abs: 2.1 K/uL (ref 0.7–4.0)
MCH: 31.8 pg (ref 26.0–34.0)
MCHC: 32 g/dL (ref 30.0–36.0)
MCV: 99.1 fL (ref 80.0–100.0)
Monocytes Absolute: 1 K/uL (ref 0.1–1.0)
Monocytes Relative: 11 %
Neutro Abs: 6 K/uL (ref 1.7–7.7)
Neutrophils Relative %: 63 %
Platelet Count: 402 K/uL — ABNORMAL HIGH (ref 150–400)
RBC: 3.37 MIL/uL — ABNORMAL LOW (ref 3.87–5.11)
RDW: 21 % — ABNORMAL HIGH (ref 11.5–15.5)
WBC Count: 9.4 K/uL (ref 4.0–10.5)
nRBC: 0 % (ref 0.0–0.2)

## 2024-08-05 LAB — TOTAL PROTEIN, URINE DIPSTICK: Protein, ur: 100 mg/dL — AB

## 2024-08-05 MED ORDER — DEXAMETHASONE SODIUM PHOSPHATE 10 MG/ML IJ SOLN
10.0000 mg | Freq: Once | INTRAMUSCULAR | Status: AC
  Administered 2024-08-05: 10 mg via INTRAVENOUS
  Filled 2024-08-05: qty 1

## 2024-08-05 MED ORDER — POTASSIUM CHLORIDE CRYS ER 20 MEQ PO TBCR: 20.0000 meq | EXTENDED_RELEASE_TABLET | Freq: Two times a day (BID) | ORAL | 0 refills | Status: DC

## 2024-08-05 MED ORDER — PROCHLORPERAZINE MALEATE 10 MG PO TABS
10.0000 mg | ORAL_TABLET | Freq: Once | ORAL | Status: AC
Start: 2024-08-05 — End: 2024-08-05
  Administered 2024-08-05: 10 mg via ORAL
  Filled 2024-08-05: qty 1

## 2024-08-05 MED ORDER — SODIUM CHLORIDE 0.9 % IV SOLN: Freq: Once | INTRAVENOUS | Status: AC

## 2024-08-05 MED ORDER — SODIUM CHLORIDE 0.9 % IV SOLN
600.0000 mg | Freq: Once | INTRAVENOUS | Status: AC
  Administered 2024-08-05: 600 mg via INTRAVENOUS
  Filled 2024-08-05: qty 8

## 2024-08-05 MED ORDER — SODIUM CHLORIDE 0.9 % IV SOLN
500.0000 mg/m2 | Freq: Once | INTRAVENOUS | Status: AC
  Administered 2024-08-05: 700 mg via INTRAVENOUS
  Filled 2024-08-05: qty 20

## 2024-08-05 NOTE — Patient Instructions (Signed)
 [facility] - A DEPT OF [place]  Discharge Instructions: Thank you for choosing [facility] to provide your oncology and hematology care.   If you have a lab appointment with the Cancer Center, please go directly to the Cancer Center and check in at the registration area.   Wear comfortable clothing and clothing appropriate for easy access to any Portacath or PICC line.   We strive to give you quality time with your provider. You may need to reschedule your appointment if you arrive late (15 or more minutes).  Arriving late affects you and other patients whose appointments are after yours.  Also, if you miss three or more appointments without notifying the office, you may be dismissed from the clinic at the provider's discretion.      For prescription refill requests, have your pharmacy contact our office and allow 72 hours for refills to be completed.    Today you received the following chemotherapy and/or immunotherapy agents Mvasi  and alimta       To help prevent nausea and vomiting after your treatment, we encourage you to take your nausea medication as directed.  BELOW ARE SYMPTOMS THAT SHOULD BE REPORTED IMMEDIATELY: *FEVER GREATER THAN 100.4 F (38 C) OR HIGHER *CHILLS OR SWEATING *NAUSEA AND VOMITING THAT IS NOT CONTROLLED WITH YOUR NAUSEA MEDICATION *UNUSUAL SHORTNESS OF BREATH *UNUSUAL BRUISING OR BLEEDING *URINARY PROBLEMS (pain or burning when urinating, or frequent urination) *BOWEL PROBLEMS (unusual diarrhea, constipation, pain near the anus) TENDERNESS IN MOUTH AND THROAT WITH OR WITHOUT PRESENCE OF ULCERS (sore throat, sores in mouth, or a toothache) UNUSUAL RASH, SWELLING OR PAIN  UNUSUAL VAGINAL DISCHARGE OR ITCHING   Items with * indicate a potential emergency and should be followed up as soon as possible or go to the Emergency Department if any problems should occur.  Please show the CHEMOTHERAPY ALERT CARD or  IMMUNOTHERAPY ALERT CARD at check-in to the Emergency Department and triage nurse.  Should you have questions after your visit or need to cancel or reschedule your appointment, please contact [facility] - A DEPT OF [facility]  Dept: [phone]  and follow the prompts.  Office hours are 8:00 a.m. to 4:30 p.m. Monday - Friday. Please note that voicemails left after 4:00 p.m. may not be returned until the following business day.  We are closed weekends and major holidays. You have access to a nurse at all times for urgent questions. Please call the main number to the clinic Dept: [phone] and follow the prompts.   For any non-urgent questions, you may also contact your provider using MyChart. We now offer e-Visits for anyone 26 and older to request care online for non-urgent symptoms. For details visit [email or web address].   Also download the MyChart app! Go to the app store, search MyChart, open the app, select [place], and log in with your MyChart username and password.

## 2024-08-05 NOTE — Progress Notes (Signed)
 [facility] [phone]   [phone]  OFFICE PROGRESS NOTE  [name], PA-C [address] [place] [address]  DIAGNOSIS:  Stage IV (T4, N2, M1 a) non-small cell lung cancer, adenocarcinoma presented with multifocal disease involving the right upper lobe, right lower lobe as well as suspicious lower paratracheal lymphadenopathy and groundglass nodules in the left lung diagnosed in May 2022. The patient had molecular studies performed by [facility] [employer] and that showed no detectable mutation but this is likely secondary to low-level circulating free tumor DNA.  PRIOR THERAPY: None.  CURRENT THERAPY: palliative systemic chemotherapy with carboplatin  for AUC of 5, Alimta  500 Mg/M2 and Avastin  15 Mg/KG every 3 weeks.  The patient is not a great candidate for immunotherapy because of her history of active systemic lupus erythematosus.  Starting from cycle #7 the patient is on maintenance treatment with Alimta  and Avastin  every 3 weeks.  She is status post 53 cycles.  INTERVAL HISTORY: TORY MCKISSACK 53 y.o. female returns to the clinic today for follow-up visit. Discussed the use of AI scribe software for clinical note transcription with the patient, who gave verbal consent to proceed.  History of Present Illness KEYMIAH LYLES is a 53 year old female undergoing maintenance treatment with Alimta  and Avastin  who presents for evaluation before starting cycle number fifty-four.  She is currently on maintenance treatment with Alimta  and Avastin  every three weeks, having completed fifty-three cycles. Recent imaging of the chest, abdomen, and pelvis showed a slight increase in size of a speculated nodule in the right lower lobe, now measuring 2.4 by 2.0 cm, previously 2.3 by 1.5 cm. Despite this, she feels well.  She has undergone a heart scan recently, with ultrasound and echo results being normal. She is uncertain about continuing Coreg  and plans to  discuss it with her cardiologist on September 25.  Her potassium level is low at 2.9. She reports weight loss, attributing it to improved eating habits, although she does not eat large amounts at each meal.  She has postponed dental surgery until November 3 due to pending heart clearance. She plans to travel to [place]  in October to visit her grandbabies, as her husband has taken the month off.     MEDICAL HISTORY: Past Medical History:  Diagnosis Date   Adenocarcinoma of right lung (HCC) 02/15/2022   Adenocarcinoma of right lung, stage 4 (HCC) 06/14/2021   Adenocarcinoma, lung, right (HCC) 05/03/2021   FARRAH SKODA is a 53 y.o. female with a history of lung nodules dating back to 2019 who is referred in consultation with Lot Montey, PA-C for assessment and management. She is a former smoker having quit in 2016. 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 She missed her screening CT last year and imaging was ordered last month. 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   Hypercalcemia 08/23/2022   Hypertension, essential, benign    Hypokalemia 04/22/2024   INSOMNIA NOS 02/06/2007   Qualifier: Diagnosis of   By: Kivett,  Whitney         MIGRAINE, UNSPEC., W/O INTRACTABLE MIGRAINE 02/06/2007   Qualifier: Diagnosis of   By: Damien Folks      Replacing diagnoses that were inactivated after the 03/11/23 regulatory import     Osteoporosis 03/01/2022   Palpitations    Pleuritic chest pain 10/24/2022   Post-op pain 05/04/2021   Rheumatoid arthritis (HCC)  02/15/2022   Skin infection 11/13/2023   SLE (systemic lupus erythematosus) (HCC) 05/03/2021   SLE (systemic lupus erythematosus) (HCC) 05/03/2021   TOBACCO DEPENDENCE 02/06/2007   Qualifier: Diagnosis of   By: Kivett, Whitney          ALLERGIES:  is allergic to clindamycin/lincomycin, losartan, and carboplatin .  MEDICATIONS:  Current Outpatient Medications  Medication Sig Dispense Refill   amLODipine (NORVASC) 10 MG tablet Take 10 mg by mouth daily.     aspirin  EC 81 MG tablet Take 1 tablet (81 mg total) by mouth daily. Swallow whole.     B Complex Vitamins (B COMPLEX 50 PO) Take by mouth.     Calcium Carbonate-Vit D-Min (CALCIUM 1200 PO) Take 1 capsule by mouth daily.     carvedilol  (COREG ) 6.25 MG tablet Take 1 tablet (6.25 mg total) by mouth 2 (two) times daily. 180 tablet 3   cholecalciferol (VITAMIN D3) 25 MCG (1000 UNIT) tablet Take 2,000 Units by mouth daily.     CLOBETASOL PROPIONATE E 0.05 % emollient cream Apply topically.     cyclobenzaprine (FLEXERIL) 10 MG tablet Take 10 mg by mouth at bedtime as needed for muscle spasms.     denosumab  (PROLIA ) 60 MG/ML SOSY injection Inject 60 mg into the skin every 6 (six) months.     dexamethasone  (DECADRON ) 4 MG tablet 4 mg p.o. twice daily the day before, day of and day after the chemotherapy every 3 weeks. 40 tablet 2   dicyclomine (BENTYL) 10 MG capsule Take 10 mg by mouth every 6 (six) hours as needed for spasms.     diphenhydrAMINE  (BENADRYL ) 25 MG tablet Take 25 mg by mouth daily as needed for allergies.     famotidine  (PEPCID ) 40 MG tablet Take 40 mg by mouth daily.     fluticasone (FLONASE) 50 MCG/ACT nasal spray Place 2 sprays into both nostrils daily.     folic acid  (FOLVITE ) 1 MG tablet TAKE 1 TABLET(1 MG) BY MOUTH DAILY 30 tablet 4   gabapentin (NEURONTIN) 300 MG capsule Take 300 mg by mouth at bedtime.     hydrocortisone  1 % lotion Apply 1 application topically 2 (two) times daily. 118 mL 0   hydroxychloroquine (PLAQUENIL)  200 MG tablet Take 200 mg by mouth 2 (two) times daily.     ketoconazole (NIZORAL) 2 % shampoo SMARTSIG:Topical 2-3 Times Weekly     metoprolol  tartrate (LOPRESSOR ) 100 MG tablet Take 1 tablet (100 mg total) by mouth once for 1 dose. Take 2 hours prior to your CT if your heart rate is greater than 55 1 tablet 0   Multiple Vitamins-Minerals (MULTI ADULT GUMMIES) CHEW Chew 2 tablets by mouth daily.     omeprazole (PRILOSEC) 40 MG capsule Take 40 mg by mouth daily as needed (acid reflux).     oxyCODONE -acetaminophen  (PERCOCET/ROXICET) 5-325 MG tablet Take 1 tablet by mouth every 8 (eight) hours as needed for severe pain (pain score 7-10). 30 tablet 0   potassium chloride  20 MEQ/15ML (10%) SOLN Take 15 mLs (20 mEq total) by mouth daily. 105 mL 0   potassium chloride   SA (KLOR-CON  M) 20 MEQ tablet Take 1 tablet (20 mEq total) by mouth 2 (two) times daily. 20 tablet 0   Probiotic Product (PROBIOTIC-10 PO) Take 1 tablet by mouth daily.     prochlorperazine  (COMPAZINE ) 10 MG tablet TAKE 1 TABLET(10 MG) BY MOUTH EVERY 6 HOURS AS NEEDED 30 tablet 2   rizatriptan (MAXALT-MLT) 10 MG disintegrating tablet Take 10 mg by mouth 2 (two) times daily as needed for migraine.     tetrahydrozoline 0.05 % ophthalmic solution Place 1 drop into both eyes once. Systane Eye Drops once daily both eyes     triamcinolone ointment (KENALOG) 0.5 % Apply 1 Application topically 3 (three) times daily.     No current facility-administered medications for this visit.    SURGICAL HISTORY:  Past Surgical History:  Procedure Laterality Date   ABDOMINAL HYSTERECTOMY     BRONCHIAL BIOPSY  04/24/2021   Procedure: BRONCHIAL BIOPSIES;  Surgeon: Shelah Lamar RAMAN, MD;  Location: [facility] ENDOSCOPY;  Service: Pulmonary;;   BRONCHIAL BRUSHINGS  04/24/2021   Procedure: BRONCHIAL BRUSHINGS;  Surgeon: Shelah Lamar RAMAN, MD;  Location: [facility] ENDOSCOPY;  Service: Pulmonary;;   BRONCHIAL NEEDLE ASPIRATION BIOPSY  04/24/2021   Procedure: BRONCHIAL NEEDLE  ASPIRATION BIOPSIES;  Surgeon: Shelah Lamar RAMAN, MD;  Location: [facility] ENDOSCOPY;  Service: Pulmonary;;   BRONCHIAL WASHINGS  04/24/2021   Procedure: BRONCHIAL WASHINGS;  Surgeon: Shelah Lamar RAMAN, MD;  Location: [facility] ENDOSCOPY;  Service: Pulmonary;;   CESAREAN SECTION  11/09/1990   DILATION AND CURETTAGE OF UTERUS Bilateral 09/09/1996   FIDUCIAL MARKER PLACEMENT  04/24/2021   Procedure: FIDUCIAL MARKER PLACEMENT;  Surgeon: Shelah Lamar RAMAN, MD;  Location: [facility] ENDOSCOPY;  Service: Pulmonary;;   TONSILLECTOMY  12/10/1977   VIDEO BRONCHOSCOPY WITH ENDOBRONCHIAL NAVIGATION Bilateral 04/24/2021   Procedure: VIDEO BRONCHOSCOPY WITH ENDOBRONCHIAL NAVIGATION;  Surgeon: Shelah Lamar RAMAN, MD;  Location: [facility] ENDOSCOPY;  Service: Pulmonary;  Laterality: Bilateral;    REVIEW OF SYSTEMS:  Constitutional: negative Eyes: negative Ears, nose, mouth, throat, and face: negative Respiratory: negative Cardiovascular: negative Gastrointestinal: negative Genitourinary:negative Integument/breast: negative Hematologic/lymphatic: negative Musculoskeletal:negative Neurological: negative Behavioral/Psych: negative Endocrine: negative Allergic/Immunologic: negative   PHYSICAL EXAMINATION: General appearance: alert, cooperative, and no distress Head: Normocephalic, without obvious abnormality, atraumatic Neck: no adenopathy, no JVD, supple, symmetrical, trachea midline, and thyroid  not enlarged, symmetric, no tenderness/mass/nodules Lymph nodes: Cervical, supraclavicular, and axillary nodes normal. Resp: clear to auscultation bilaterally Back: symmetric, no curvature. ROM normal. No CVA tenderness. Cardio: regular rate and rhythm, S1, S2 normal, no murmur, click, rub or gallop GI: soft, non-tender; bowel sounds normal; no masses,  no organomegaly Extremities: extremities normal, atraumatic, no cyanosis or edema Neurologic: Alert and oriented X 3, normal strength and tone. Normal symmetric reflexes. Normal coordination and  gait  ECOG PERFORMANCE STATUS: 1 - Symptomatic but completely ambulatory  Blood pressure 127/75, pulse 86, temperature 97.8 F (36.6 C), resp. rate 17, height 5' (1.524 m), weight 92 lb (41.7 kg), SpO2 100%.  LABORATORY DATA: Lab Results  Component Value Date   WBC 9.4 08/05/2024   HGB 10.7 (L) 08/05/2024   HCT 33.4 (L) 08/05/2024   MCV 99.1 08/05/2024   PLT 402 (H) 08/05/2024      Chemistry      Component Value Date/Time   NA 141 08/05/2024 0929   NA 142 07/01/2024 0902   K 2.9 (L) 08/05/2024 0929   CL 105 08/05/2024 0929   CO2 25 08/05/2024 0929   BUN 9 08/05/2024 0929   BUN 29 (H) 07/01/2024  0902   CREATININE 0.84 08/05/2024 0929   GLU 107 03/13/2021 0000      Component Value Date/Time   CALCIUM 8.5 (L) 08/05/2024 0929   ALKPHOS 106 08/05/2024 0929   AST 20 08/05/2024 0929   ALT 11 08/05/2024 0929   BILITOT 0.3 08/05/2024 0929       RADIOGRAPHIC STUDIES: CT CHEST ABDOMEN PELVIS W CONTRAST Result Date: 08/04/2024 CLINICAL DATA:  Metastatic disease evaluation, adenocarcinoma of right lung stage IV, chemotherapy ongoing. Prior partial hysterectomy and D and C. * Tracking Code: BO * EXAM: CT CHEST, ABDOMEN, AND PELVIS WITH CONTRAST TECHNIQUE: Multidetector CT imaging of the chest, abdomen and pelvis was performed following the standard protocol during bolus administration of intravenous contrast. RADIATION DOSE REDUCTION: This exam was performed according to the departmental dose-optimization program which includes automated exposure control, adjustment of the mA and/or kV according to patient size and/or use of iterative reconstruction technique. CONTRAST:  80mL OMNIPAQUE  IOHEXOL  300 MG/ML  SOLN COMPARISON:  Multiple priors including CT Apr 10, 2024 FINDINGS: CT CHEST FINDINGS Cardiovascular: Aortic atherosclerosis. Normal size heart. No significant pericardial effusion/thickening. Mediastinum/Nodes: Stable enlarged mediastinal lymph nodes. Previously index pretracheal lymph  node measures 17 x 11 mm on image 21/301 previously 18 x 11 mm. Lungs/Pleura: Dominant sub solid spiculated nodule in the superior segment of the right lower lobe measures 2.4 x 2.0 cm on image 51/301 previously 2.3 x 1.5 cm. Additional referenced sub solid and ground-glass nodules are below. -peripheral left upper lobe subsolid opacity measures 12 mm on image 30/302, unchanged when remeasured for consistency. -left upper lobe ground-glass opacity measures 17 mm on image 23/302, unchanged Subsolid nodule adjacent to fiducial markers in the right upper lobe measures 14 mm on image 28/302, unchanged. Stable 3 mm solid nodule in the anterior inferior right upper lobe on image 39/302 No new suspicious pulmonary nodules or masses. Multiple metallic biopsy clips and fiducial markers. Musculoskeletal: No aggressive lytic or blastic lesion of bone. CT ABDOMEN PELVIS FINDINGS Hepatobiliary: No suspicious hepatic lesion. Gallbladder is unremarkable. No biliary ductal dilation. Pancreas: No pancreatic ductal dilation or evidence of acute inflammation. Spleen: No splenomegaly. Adrenals/Urinary Tract: Similar bilateral adrenal thickening and nodularity previously characterized as benign adenomas are stable. Right extrarenal pelvis. Kidneys demonstrate symmetric enhancement. Urinary bladder is unremarkable for degree of distension. Stomach/Bowel: Radiopaque enteric contrast material traverses the descending colon. Stomach is minimally distended limiting evaluation. No pathologic dilation of small or large bowel. Colonic stool burden compatible with constipation. Vascular/Lymphatic: Normal caliber abdominal aorta. Smooth IVC contours. The portal, splenic and superior mesenteric veins are patent. No pathologically enlarged abdominal or pelvic lymph nodes. Reproductive: Status post hysterectomy. No adnexal masses. Other: No significant abdominopelvic free fluid. Musculoskeletal: No aggressive lytic or blastic lesion of bone.  IMPRESSION: 1. Increased size of the dominant sub solid spiculated nodule in the superior segment of the right lower lobe. 2. Additional referenced sub solid and ground-glass nodules are stable. 3. Stable enlarged mediastinal lymph nodes. 4. No evidence of metastatic disease in the abdomen or pelvis. 5. Colonic stool burden compatible with constipation. 6. Aortic atherosclerosis. Aortic Atherosclerosis (ICD10-I70.0). Electronically Signed   By: Reyes Holder M.D.   On: 08/04/2024 15:21   CT CORONARY MORPH W/CTA COR W/SCORE W/CA W/CM &/OR WO/CM Result Date: 07/30/2024 CLINICAL DATA:  CP EXAM: Cardiac/Coronary  CTA TECHNIQUE: The patient was scanned on a GE Apex scanner. FINDINGS: A 100 kV prospective scan was triggered in the descending thoracic aorta at 111 HU's. Axial non-contrast 3  mm slices were carried out through the heart. The data set was analyzed on a dedicated work station and scored using the Agatson method. Gantry rotation speed was 250 msecs and collimation was .6 mm. No beta blockade and 0.8 mg of sl NTG was given. The 3D data set was reconstructed at 75% of the R-R cycle. Diastolic phases were analyzed on a dedicated work station using MPR, MIP and VRT modes. The patient received 80 cc of contrast. Aorta: Normal size.  No calcifications.  No dissection. Aortic Valve:  Trileaflet.  No calcifications. Coronary Arteries:  Normal coronary origin.  Right dominance. RCA is a large dominant artery that gives rise to PDA and PLA. There is no plaque. Left main is a large artery that gives rise to LAD and LCX arteries. LAD is a large vessel that has no plaque. This artery gives rise to large D1 and large D2 - free of disease. LCX is a non-dominant artery that gives rise to one large OM1 branch. There is no plaque. Other findings: Normal pulmonary vein drainage into the left atrium. Normal left atrial appendage without a thrombus. Normal size of the pulmonary artery. IMPRESSION: 1. Coronary calcium score of  0. 2. Normal coronary origin with right dominance. 3. CAD-RADS 0. No evidence of CAD (0%). Consider non-atherosclerotic causes of chest pain. 4. Atherosclerosis noted in the descending thoracic and abdominal aorta. Electronically Signed   By: Lamar Fitch M.D.   On: 07/30/2024 18:24   LONG TERM MONITOR (3-14 DAYS) Result Date: 07/27/2024   Predominantly sinus rhythm with average heart rate 94/min [ranging from 60 to 152 bpm].   Rare ventricular and supraventricular ectopy burden, less than 1%.   2 short runs of SVT with the longest episode lasting 7 beats, asymptomatic.   Total 6 patient triggered events and diary entries correlated with normal heart rhythm ranging from heart rates 76 to 133 bpm.   No high-grade AV blocks, pauses, sustained arrhythmias. Patch Wear Time:  7 days and 1 hours (2025-07-22T16:12:47-0400 to 2025-07-29T17:37:07-399) Patient had a min HR of 60 bpm, max HR of 152 bpm, and avg HR of 94 bpm. Predominant underlying rhythm was Sinus Rhythm. 2 Supraventricular Tachycardia runs occurred, the run with the fastest interval lasting 7 beats with a max rate of 140 bpm (avg 131 bpm); the run with the fastest interval was also the longest. Isolated SVEs were rare (<1.0%), SVE Couplets were rare (<1.0%), and SVE Triplets were rare (<1.0%). Isolated VEs were rare (<1.0%), and no VE Couplets or VE Triplets were present.  ECHOCARDIOGRAM COMPLETE Result Date: 07/24/2024    ECHOCARDIOGRAM REPORT   Patient Name:   AMYAH CLAWSON Date of Exam: 07/24/2024 Medical Rec #:  [id number]        Height:       60.0 in Accession #:    [id number]       Weight:       92.2 lb Date of Birth:  [birth year]        BSA:          1.343 m Patient Age:    52 years         BP:           162/86 mmHg Patient Gender: F                HR:           74 bpm. Exam Location:  [facility] Procedure: 2D Echo, Cardiac Doppler, Color Doppler and Strain Analysis (  Both            Spectral and Color Flow Doppler were utilized during  procedure). Indications:    Chest pain on exertion [R07.9 (ICD-10-CM)]  History:        Patient has no prior history of Echocardiogram examinations.                 Signs/Symptoms:Chest Pain; Risk Factors:Hypertension and Former                 Smoker. Palpitations.  Sonographer:    Charlie Jointer RDCS Referring Phys: [id number] ALEAN SAUNDERS MADIREDDY IMPRESSIONS  1. Left ventricular ejection fraction, by estimation, is 50 to 55%. The left ventricle has low normal function. The left ventricle has no regional wall motion abnormalities. Left ventricular diastolic parameters were normal. The average left ventricular  global longitudinal strain is -13.8 %. The global longitudinal strain is abnormal.  2. Right ventricular systolic function is normal. The right ventricular size is normal. There is normal pulmonary artery systolic pressure.  3. The mitral valve is normal in structure. Trivial mitral valve regurgitation. No evidence of mitral stenosis.  4. The aortic valve is normal in structure. Aortic valve regurgitation is not visualized. No aortic stenosis is present.  5. The inferior vena cava is normal in size with greater than 50% respiratory variability, suggesting right atrial pressure of 3 mmHg. FINDINGS  Left Ventricle: Left ventricular ejection fraction, by estimation, is 50 to 55%. The left ventricle has low normal function. The left ventricle has no regional wall motion abnormalities. The average left ventricular global longitudinal strain is -13.8 %. Strain was performed and the global longitudinal strain is abnormal. The left ventricular internal cavity size was normal in size. There is no left ventricular hypertrophy. Left ventricular diastolic parameters were normal. Right Ventricle: The right ventricular size is normal. No increase in right ventricular wall thickness. Right ventricular systolic function is normal. There is normal pulmonary artery systolic pressure. The tricuspid regurgitant velocity is 2.48  m/s, and  with an assumed right atrial pressure of 3 mmHg, the estimated right ventricular systolic pressure is 27.6 mmHg. Left Atrium: Left atrial size was normal in size. Right Atrium: Right atrial size was normal in size. Pericardium: There is no evidence of pericardial effusion. Mitral Valve: The mitral valve is normal in structure. Trivial mitral valve regurgitation. No evidence of mitral valve stenosis. Tricuspid Valve: The tricuspid valve is normal in structure. Tricuspid valve regurgitation is not demonstrated. No evidence of tricuspid stenosis. Aortic Valve: The aortic valve is normal in structure. Aortic valve regurgitation is not visualized. No aortic stenosis is present. Pulmonic Valve: The pulmonic valve was normal in structure. Pulmonic valve regurgitation is not visualized. No evidence of pulmonic stenosis. Aorta: The aortic root is normal in size and structure. Venous: The inferior vena cava is normal in size with greater than 50% respiratory variability, suggesting right atrial pressure of 3 mmHg. IAS/Shunts: No atrial level shunt detected by color flow Doppler.  LEFT VENTRICLE PLAX 2D LVIDd:         3.70 cm   Diastology LVIDs:         2.70 cm   LV e' medial:    9.09 cm/s LV PW:         1.00 cm   LV E/e' medial:  9.3 LV IVS:        0.90 cm   LV e' lateral:   14.10 cm/s LVOT diam:     1.80 cm  LV E/e' lateral: 6.0 LV SV:         46 LV SV Index:   34        2D Longitudinal Strain LVOT Area:     2.54 cm  2D Strain GLS Avg:     -13.8 %  RIGHT VENTRICLE            IVC RV Basal diam:  2.10 cm    IVC diam: 1.30 cm RV Mid diam:    1.70 cm RV S prime:     9.64 cm/s TAPSE (M-mode): 2.4 cm LEFT ATRIUM             Index        RIGHT ATRIUM          Index LA diam:        2.40 cm 1.79 cm/m   RA Area:     6.63 cm LA Vol (A2C):   28.3 ml 21.07 ml/m  RA Volume:   10.20 ml 7.59 ml/m LA Vol (A4C):   26.2 ml 19.51 ml/m LA Biplane Vol: 28.9 ml 21.52 ml/m  AORTIC VALVE LVOT Vmax:   102.63 cm/s LVOT Vmean:  62.667  cm/s LVOT VTI:    0.181 m  AORTA Ao Root diam: 2.50 cm Ao Asc diam:  2.90 cm Ao Desc diam: 1.90 cm MITRAL VALVE               TRICUSPID VALVE MV Area (PHT): 4.31 cm    TR Peak grad:   24.6 mmHg MV Decel Time: 176 msec    TR Vmax:        248.00 cm/s MV E velocity: 84.65 cm/s MV A velocity: 59.70 cm/s  SHUNTS MV E/A ratio:  1.42        Systemic VTI:  0.18 m                            Systemic Diam: 1.80 cm Lamar Fitch MD Electronically signed by Lamar Fitch MD Signature Date/Time: 07/24/2024/8:45:40 AM    Final      ASSESSMENT AND PLAN:  This is a very pleasant 53 years old white female recently diagnosed with a stage IV non-small cell lung cancer, adenocarcinoma with no actionable mutation diagnosed in May 2022.  The patient has also history of systemic lupus erythematosus and she is not a candidate for immunotherapy. She is currently undergoing systemic chemotherapy with carboplatin  for AUC of 5, Alimta  500 Mg/M2 and Avastin  15 Mg/KG every 3 weeks status post 53 cycles.  Starting from cycle #7 the patient is on maintenance treatment with Alimta  and Avastin  every 3 weeks.   She has been tolerating this treatment fairly well. She had repeat CT scan of the chest, abdomen and pelvis performed recently.  I personally and independently reviewed the scan and discussed the result with the patient today.  Her scan showed no concerning findings for disease progression except for increased size of a dominant subsolid spiculated nodule in the superior segment of the right lower lobe that need close monitoring on the upcoming imaging studies. Assessment and Plan Assessment & Plan Malignant neoplasm of right lower lobe of lung She is on maintenance treatment with Alimta  and Avastin  every three weeks, having completed 53 cycles. Recent imaging of the chest, abdomen, and pelvis shows a speculated nodule in the right lower lobe, now measuring 2.4 x 2.0 cm, previously 2.3 x 1.5 cm. The increase in size is  considered minor, and no changes in treatment are deemed necessary at this time. - Continue Alimta  and Avastin  maintenance treatment every three weeks.  Hypokalemia Potassium level is low at 2.9 mmol/L. - Prescribe potassium supplements.  Proteinuria Urine protein level is 100 mg/dL. No specific concerns were raised regarding this finding in the context of her current treatment. The patient was advised to call immediately if she has any concerning symptoms in the interval. The patient voices understanding of current disease status and treatment options and is in agreement with the current care plan.  All questions were answered. The patient knows to call the clinic with any problems, questions or concerns. We can certainly see the patient much sooner if necessary. The total time spent in the appointment was 30 minutes.  Disclaimer: This note was dictated with voice recognition software. Similar sounding words can inadvertently be transcribed and may not be corrected upon review.

## 2024-08-05 NOTE — Telephone Encounter (Signed)
 Patient expressed understanding that her potassium is low. She is going to pick up her prescription from the pharmacy now.

## 2024-08-05 NOTE — Progress Notes (Signed)
 Ok to reduce bevacizumab  dose to 600mg  (~15mg /kg rounded to vial size) based on today's wt=41.7kg and ok to update wt basis for tx plan to today's wt per Dr. Sherrod.  Namiyah Grantham, PharmD, MBA

## 2024-08-20 ENCOUNTER — Other Ambulatory Visit: Payer: Self-pay | Admitting: Physician Assistant

## 2024-08-21 NOTE — Progress Notes (Signed)
 [facility] OFFICE PROGRESS NOTE  [name], PA-C [address] [place] [address]  DIAGNOSIS: Stage IV (T4, N2, M1 a) non-small cell lung cancer, adenocarcinoma presented with multifocal disease involving the right upper lobe, right lower lobe as well as suspicious lower paratracheal lymphadenopathy and groundglass nodules in the left lung diagnosed in May 2022. The patient had molecular studies performed by [facility] [employer] and that showed no detectable mutation but this is likely secondary to low-level circulating free tumor DNA   PRIOR THERAPY: None  CURRENT THERAPY: Palliative systemic chemotherapy with carboplatin  for AUC of 5, Alimta  500 Mg/M2 and Avastin  15 Mg/KG every 3 weeks.  The patient is not a great candidate for immunotherapy in the event that she has a genetic mutation.  She is status post 54 cycles. Starting from cycle #6, the patient will start maintenance Alimta  and Avastin .   INTERVAL HISTORY: Kirsten Williams 53 y.o. female returns to the clinic today for a follow up visit. The patient was last see in the clinic by Dr. Sherrod 3 weeks ago. She is currently on treatment with Alimta  and avastin .     She is seeing a rheumatologist for her lupus flareup on 08/20/2024.  She is also going to go to [place]  and [place]  in October. She will be gone majority of the month. She is scheduled for her dental implant tentatively in November which is contingent on her cardiology clearance. She sees cardiology next week. She also is scheduled to see rheumatology again and they discussed having her see a nephrologist. She is currently using clobetasol cream and taking Plaquenil, with her dosage increased to two tablets.  She experiences nausea, which she manages by adjusting her diet, including switching to chocolate milk, which she finds soothing and helpful in taking her potassium supplement. She has fatigue that comes and goes. No recent fevers, infections, or significant  changes in her shortness of breath. She does experience easy bruising but denies abnormal bleeding. On a regular basis after treatment she does experience a bandlike sensation around her diaphragm which is unchanged.    She is scheduled to see Dr. Fayetta on 10/09/24 to discuss if she is a candidate for PEF.   She is here today for evaluation and repeat blood work before undergoing cycle #55.     MEDICAL HISTORY: Past Medical History:  Diagnosis Date   Adenocarcinoma of right lung (HCC) 02/15/2022   Adenocarcinoma of right lung, stage 4 (HCC) 06/14/2021   Adenocarcinoma, lung, right (HCC) 05/03/2021   Kirsten Williams is a 53 y.o. female with a history of lung nodules dating back to 2019 who is referred in consultation with Lot Montey, PA-C for assessment and management. She is a former smoker having quit in 2016. 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 She missed her screening CT last year and imaging was ordered last month. 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   Hypercalcemia 08/23/2022   Hypertension, essential, benign    Hypokalemia 04/22/2024   INSOMNIA NOS 02/06/2007  Qualifier: Diagnosis of   By: Damien Folks         MIGRAINE, UNSPEC., W/O INTRACTABLE MIGRAINE 02/06/2007   Qualifier: Diagnosis of   By: Damien Folks      Replacing diagnoses that were inactivated after the 03/11/23 regulatory import     Osteoporosis 03/01/2022   Palpitations    Pleuritic chest pain 10/24/2022   Post-op pain 05/04/2021   Rheumatoid arthritis (HCC) 02/15/2022    Skin infection 11/13/2023   SLE (systemic lupus erythematosus) (HCC) 05/03/2021   SLE (systemic lupus erythematosus) (HCC) 05/03/2021   TOBACCO DEPENDENCE 02/06/2007   Qualifier: Diagnosis of   By: Kivett, Whitney          ALLERGIES:  is allergic to clindamycin/lincomycin, losartan, and carboplatin .  MEDICATIONS:  Current Outpatient Medications  Medication Sig Dispense Refill   potassium chloride  SA (KLOR-CON  M) 20 MEQ tablet Take 1 tablet (20 mEq total) by mouth daily. 8 tablet 0   amLODipine (NORVASC) 10 MG tablet Take 10 mg by mouth daily.     aspirin  EC 81 MG tablet Take 1 tablet (81 mg total) by mouth daily. Swallow whole.     B Complex Vitamins (B COMPLEX 50 PO) Take by mouth.     Calcium Carbonate-Vit D-Min (CALCIUM 1200 PO) Take 1 capsule by mouth daily.     carvedilol  (COREG ) 6.25 MG tablet Take 1 tablet (6.25 mg total) by mouth 2 (two) times daily. 180 tablet 3   cholecalciferol (VITAMIN D3) 25 MCG (1000 UNIT) tablet Take 2,000 Units by mouth daily.     CLOBETASOL PROPIONATE E 0.05 % emollient cream Apply topically.     cyclobenzaprine (FLEXERIL) 10 MG tablet Take 10 mg by mouth at bedtime as needed for muscle spasms.     denosumab  (PROLIA ) 60 MG/ML SOSY injection Inject 60 mg into the skin every 6 (six) months.     dexamethasone  (DECADRON ) 4 MG tablet 4 mg p.o. twice daily the day before, day of and day after the chemotherapy every 3 weeks. 40 tablet 2   dicyclomine (BENTYL) 10 MG capsule Take 10 mg by mouth every 6 (six) hours as needed for spasms.     diphenhydrAMINE  (BENADRYL ) 25 MG tablet Take 25 mg by mouth daily as needed for allergies.     famotidine  (PEPCID ) 40 MG tablet Take 40 mg by mouth daily.     fluticasone (FLONASE) 50 MCG/ACT nasal spray Place 2 sprays into both nostrils daily.     folic acid  (FOLVITE ) 1 MG tablet TAKE 1 TABLET(1 MG) BY MOUTH DAILY 30 tablet 4   gabapentin (NEURONTIN) 300 MG capsule Take 300 mg by mouth at bedtime.     hydrocortisone  1 %  lotion Apply 1 application topically 2 (two) times daily. 118 mL 0   hydroxychloroquine (PLAQUENIL) 200 MG tablet Take 200 mg by mouth 2 (two) times daily.     ketoconazole (NIZORAL) 2 % shampoo SMARTSIG:Topical 2-3 Times Weekly     metoprolol  tartrate (LOPRESSOR ) 100 MG tablet Take 1 tablet (100 mg total) by mouth once for 1 dose. Take 2 hours prior to your CT if your heart rate is greater than 55 1 tablet 0   Multiple Vitamins-Minerals (MULTI ADULT GUMMIES) CHEW Chew 2 tablets by mouth daily.     omeprazole (PRILOSEC) 40 MG capsule Take 40 mg by mouth daily as needed (acid reflux).     oxyCODONE -acetaminophen  (PERCOCET/ROXICET) 5-325 MG tablet Take 1 tablet by mouth every 8 (eight) hours as needed for severe pain (pain  score 7-10). 30 tablet 0   Probiotic Product (PROBIOTIC-10 PO) Take 1 tablet by mouth daily.     prochlorperazine  (COMPAZINE ) 10 MG tablet TAKE 1 TABLET(10 MG) BY MOUTH EVERY 6 HOURS AS NEEDED 30 tablet 2   rizatriptan (MAXALT-MLT) 10 MG disintegrating tablet Take 10 mg by mouth 2 (two) times daily as needed for migraine.     tetrahydrozoline 0.05 % ophthalmic solution Place 1 drop into both eyes once. Systane Eye Drops once daily both eyes     triamcinolone ointment (KENALOG) 0.5 % Apply 1 Application topically 3 (three) times daily.     No current facility-administered medications for this visit.   Facility-Administered Medications Ordered in Other Visits  Medication Dose Route Frequency Provider Last Rate Last Admin   0.9 %  sodium chloride  infusion   Intravenous Once Sherrod Sherrod, MD       dexamethasone  (DECADRON ) injection 10 mg  10 mg Intravenous Once Mohamed, Mohamed, MD       PEMEtrexed  Disodium (ALIMTA ) 700 mg in sodium chloride  0.9 % 100 mL chemo infusion  500 mg/m2 (Order-Specific) Intravenous Once Sherrod Sherrod, MD       prochlorperazine  (COMPAZINE ) tablet 10 mg  10 mg Oral Once Sherrod Sherrod, MD        SURGICAL HISTORY:  Past Surgical History:   Procedure Laterality Date   ABDOMINAL HYSTERECTOMY     BRONCHIAL BIOPSY  04/24/2021   Procedure: BRONCHIAL BIOPSIES;  Surgeon: Shelah Lamar RAMAN, MD;  Location: [facility] ENDOSCOPY;  Service: Pulmonary;;   BRONCHIAL BRUSHINGS  04/24/2021   Procedure: BRONCHIAL BRUSHINGS;  Surgeon: Shelah Lamar RAMAN, MD;  Location: [facility] ENDOSCOPY;  Service: Pulmonary;;   BRONCHIAL NEEDLE ASPIRATION BIOPSY  04/24/2021   Procedure: BRONCHIAL NEEDLE ASPIRATION BIOPSIES;  Surgeon: Shelah Lamar RAMAN, MD;  Location: [facility] ENDOSCOPY;  Service: Pulmonary;;   BRONCHIAL WASHINGS  04/24/2021   Procedure: BRONCHIAL WASHINGS;  Surgeon: Shelah Lamar RAMAN, MD;  Location: [facility] ENDOSCOPY;  Service: Pulmonary;;   CESAREAN SECTION  11/09/1990   DILATION AND CURETTAGE OF UTERUS Bilateral 09/09/1996   FIDUCIAL MARKER PLACEMENT  04/24/2021   Procedure: FIDUCIAL MARKER PLACEMENT;  Surgeon: Shelah Lamar RAMAN, MD;  Location: [facility] ENDOSCOPY;  Service: Pulmonary;;   TONSILLECTOMY  12/10/1977   VIDEO BRONCHOSCOPY WITH ENDOBRONCHIAL NAVIGATION Bilateral 04/24/2021   Procedure: VIDEO BRONCHOSCOPY WITH ENDOBRONCHIAL NAVIGATION;  Surgeon: Shelah Lamar RAMAN, MD;  Location: [facility] ENDOSCOPY;  Service: Pulmonary;  Laterality: Bilateral;    REVIEW OF SYSTEMS:   Review of Systems  Constitutional: Positive for stable fatigue. Negative for appetite change, chills,  and fever.  HENT: Negative for mouth sores, nosebleeds, sore throat and trouble swallowing.   Eyes: Negative for eye problems and icterus.  Respiratory: Positive for occasional shortness of breath. Negative for cough, hemoptysis, and wheezing.   Cardiovascular: Negative for chest pain (gets chest tightness routinely after treatment but none at this time). Mild ankle swelling.  Gastrointestinal: Positive for intermittent chronic nausea. Negative for constipation, diarrhea, and vomiting.  Genitourinary: Negative for bladder incontinence, difficulty urinating, dysuria, frequency and hematuria.   Musculoskeletal: Negative  for back pain, gait problem, neck pain and neck stiffness.  Skin: Positive for rash on chest, arms, and back. Neurological: Negative for dizziness, extremity weakness, gait problem, headaches, and seizures.  Hematological: Negative for adenopathy. Does not bleed easily. She reports easy bruising.   Psychiatric/Behavioral: Negative for confusion, depression and sleep disturbance. The patient is not nervous/anxious.       PHYSICAL EXAMINATION:  Blood pressure 137/81, pulse 98, temperature (!)  97.3 F (36.3 C), temperature source Temporal, resp. rate 16, weight 91 lb 9.6 oz (41.5 kg), SpO2 100%.  ECOG PERFORMANCE STATUS: 1  Physical Exam  Constitutional: Oriented to person, place, and time and well-developed, well-nourished, and in no distress.  HENT:  Head: Normocephalic and atraumatic.  Mouth/Throat: Oropharynx is clear and moist. No oropharyngeal exudate.  Eyes: Conjunctivae are normal. Right eye exhibits no discharge. Left eye exhibits no discharge. No scleral icterus.  Neck: Normal range of motion. Neck supple.  Cardiovascular: Normal rate, regular rhythm, normal heart sounds and intact distal pulses.   Pulmonary/Chest: Effort normal and breath sounds normal. No respiratory distress. No wheezes. No rales.  Abdominal: Soft. Bowel sounds are normal. Exhibits no distension and no mass. There is no tenderness.  Musculoskeletal: Normal range of motion. Exhibits no edema.  Lymphadenopathy:    No cervical adenopathy.  Neurological: Alert and oriented to person, place, and time. Exhibits normal muscle tone. Gait normal. Coordination normal.  Skin: Skin is warm and dry. Positive for rash on arms, chest, and back. Not diaphoretic. No erythema. No pallor.  Psychiatric: Mood, memory and judgment normal.  Vitals reviewed.    LABORATORY DATA: Lab Results  Component Value Date   WBC 9.8 08/26/2024   HGB 11.2 (L) 08/26/2024   HCT 34.1 (L) 08/26/2024   MCV 99.1 08/26/2024   PLT 419 (H)  08/26/2024      Chemistry      Component Value Date/Time   NA 139 08/26/2024 0907   NA 142 07/01/2024 0902   K 3.2 (L) 08/26/2024 0907   CL 104 08/26/2024 0907   CO2 25 08/26/2024 0907   BUN 13 08/26/2024 0907   BUN 29 (H) 07/01/2024 0902   CREATININE 1.10 (H) 08/26/2024 0907   GLU 107 03/13/2021 0000      Component Value Date/Time   CALCIUM 8.4 (L) 08/26/2024 0907   ALKPHOS 115 08/26/2024 0907   AST 23 08/26/2024 0907   ALT 11 08/26/2024 0907   BILITOT 0.3 08/26/2024 0907       RADIOGRAPHIC STUDIES:  CT CORONARY MORPH W/CTA COR W/SCORE W/CA W/CM &/OR WO/CM Addendum Date: 08/05/2024 ADDENDUM REPORT: 08/05/2024 21:15 EXAM: OVER-READ INTERPRETATION  CT CHEST The following report is an over-read performed by radiologist Dr. Fonda Mom [facility] on 08/05/2024. This over-read does not include interpretation of cardiac or coronary anatomy or pathology. The coronary CTA interpretation by the cardiologist is attached. COMPARISON:  07/27/2024. FINDINGS: Cardiovascular:  See findings discussed in the body of the report. Mediastinum/Nodes: No suspicious adenopathy identified. Imaged mediastinal structures are unremarkable. Lungs/Pleura: 2 cm right lower lobe spiculated nodule unchanged compared to the recent prior study. No pleural effusion or pneumothorax. Upper Abdomen: No acute abnormality. Musculoskeletal: No chest wall abnormality. No acute osseous findings. IMPRESSION: 2 cm right lower lobe spiculated nodule again noted. Electronically Signed   By: Fonda Field M.D.   On: 08/05/2024 21:15   Result Date: 08/05/2024 CLINICAL DATA:  CP EXAM: Cardiac/Coronary  CTA TECHNIQUE: The patient was scanned on a GE Apex scanner. FINDINGS: A 100 kV prospective scan was triggered in the descending thoracic aorta at 111 HU's. Axial non-contrast 3 mm slices were carried out through the heart. The data set was analyzed on a dedicated work station and scored using the Agatson method.  Gantry rotation speed was 250 msecs and collimation was .6 mm. No beta blockade and 0.8 mg of sl NTG was given. The 3D data set was reconstructed at 75% of the  R-R cycle. Diastolic phases were analyzed on a dedicated work station using MPR, MIP and VRT modes. The patient received 80 cc of contrast. Aorta: Normal size.  No calcifications.  No dissection. Aortic Valve:  Trileaflet.  No calcifications. Coronary Arteries:  Normal coronary origin.  Right dominance. RCA is a large dominant artery that gives rise to PDA and PLA. There is no plaque. Left main is a large artery that gives rise to LAD and LCX arteries. LAD is a large vessel that has no plaque. This artery gives rise to large D1 and large D2 - free of disease. LCX is a non-dominant artery that gives rise to one large OM1 branch. There is no plaque. Other findings: Normal pulmonary vein drainage into the left atrium. Normal left atrial appendage without a thrombus. Normal size of the pulmonary artery. IMPRESSION: 1. Coronary calcium score of 0. 2. Normal coronary origin with right dominance. 3. CAD-RADS 0. No evidence of CAD (0%). Consider non-atherosclerotic causes of chest pain. 4. Atherosclerosis noted in the descending thoracic and abdominal aorta. Electronically Signed: By: Lamar Fitch M.D. On: 07/30/2024 18:24   CT CHEST ABDOMEN PELVIS W CONTRAST Result Date: 08/04/2024 CLINICAL DATA:  Metastatic disease evaluation, adenocarcinoma of right lung stage IV, chemotherapy ongoing. Prior partial hysterectomy and D and C. * Tracking Code: BO * EXAM: CT CHEST, ABDOMEN, AND PELVIS WITH CONTRAST TECHNIQUE: Multidetector CT imaging of the chest, abdomen and pelvis was performed following the standard protocol during bolus administration of intravenous contrast. RADIATION DOSE REDUCTION: This exam was performed according to the departmental dose-optimization program which includes automated exposure control, adjustment of the mA and/or kV according to  patient size and/or use of iterative reconstruction technique. CONTRAST:  80mL OMNIPAQUE  IOHEXOL  300 MG/ML  SOLN COMPARISON:  Multiple priors including CT Apr 10, 2024 FINDINGS: CT CHEST FINDINGS Cardiovascular: Aortic atherosclerosis. Normal size heart. No significant pericardial effusion/thickening. Mediastinum/Nodes: Stable enlarged mediastinal lymph nodes. Previously index pretracheal lymph node measures 17 x 11 mm on image 21/301 previously 18 x 11 mm. Lungs/Pleura: Dominant sub solid spiculated nodule in the superior segment of the right lower lobe measures 2.4 x 2.0 cm on image 51/301 previously 2.3 x 1.5 cm. Additional referenced sub solid and ground-glass nodules are below. -peripheral left upper lobe subsolid opacity measures 12 mm on image 30/302, unchanged when remeasured for consistency. -left upper lobe ground-glass opacity measures 17 mm on image 23/302, unchanged Subsolid nodule adjacent to fiducial markers in the right upper lobe measures 14 mm on image 28/302, unchanged. Stable 3 mm solid nodule in the anterior inferior right upper lobe on image 39/302 No new suspicious pulmonary nodules or masses. Multiple metallic biopsy clips and fiducial markers. Musculoskeletal: No aggressive lytic or blastic lesion of bone. CT ABDOMEN PELVIS FINDINGS Hepatobiliary: No suspicious hepatic lesion. Gallbladder is unremarkable. No biliary ductal dilation. Pancreas: No pancreatic ductal dilation or evidence of acute inflammation. Spleen: No splenomegaly. Adrenals/Urinary Tract: Similar bilateral adrenal thickening and nodularity previously characterized as benign adenomas are stable. Right extrarenal pelvis. Kidneys demonstrate symmetric enhancement. Urinary bladder is unremarkable for degree of distension. Stomach/Bowel: Radiopaque enteric contrast material traverses the descending colon. Stomach is minimally distended limiting evaluation. No pathologic dilation of small or large bowel. Colonic stool burden  compatible with constipation. Vascular/Lymphatic: Normal caliber abdominal aorta. Smooth IVC contours. The portal, splenic and superior mesenteric veins are patent. No pathologically enlarged abdominal or pelvic lymph nodes. Reproductive: Status post hysterectomy. No adnexal masses. Other: No significant abdominopelvic free fluid. Musculoskeletal: No  aggressive lytic or blastic lesion of bone. IMPRESSION: 1. Increased size of the dominant sub solid spiculated nodule in the superior segment of the right lower lobe. 2. Additional referenced sub solid and ground-glass nodules are stable. 3. Stable enlarged mediastinal lymph nodes. 4. No evidence of metastatic disease in the abdomen or pelvis. 5. Colonic stool burden compatible with constipation. 6. Aortic atherosclerosis. Aortic Atherosclerosis (ICD10-I70.0). Electronically Signed   By: Reyes Holder M.D.   On: 08/04/2024 15:21     ASSESSMENT/PLAN:  This is a very pleasant 53 year old Caucasian female diagnosed with stage IV (T4, N2, M1 a) non-small cell lung cancer, adenocarcinoma presented with multifocal disease involving the right upper lobe, right lower lobe as well as suspicious lower paratracheal lymphadenopathy and groundglass nodules in the left lung diagnosed in May 2022. The patient had molecular studies performed by [facility] [employer] and that showed no detectable mutation but this is likely secondary to low-level circulating free tumor DNA. If the patient has disease progression in the future, then we will likely retest her for molecular studies.      The patient is currently undergoing systemic chemotherapy with carboplatin  for an AUC 5, Alimta  500 mg per metered square, and and Avastin  15 mg/kg IV every 3 weeks.  She is status post 54 cycles.  Starting from cycle #7, the patient has been on maintenance treatment with Alimta  and Avastin .   Labs were reviewed. Her urine protein is 300. We will hold the avastin  today. She will proceed with single  agent Alimta .  Recommend that she proceed with cycle #55 today as scheduled with alimta .   Will see her back for labs and a follow-up visit on 10/07/24 as she will be out of town for majority of October. I have adjusted the dates in her care plan.   Nausea and diarrhea Managed by identifying dietary triggers. Chocolate milk helps alleviate symptoms and aids in potassium supplement intake. - Continue dietary modifications. - Monitor symptoms and adjust dietary intake as needed.  Systemic lupus erythematosus Experiencing flare-ups. Under rheumatologist care. Considering further evaluation at [facility]. - Continue clobetasol cream and Plaquenil. - Follow up with rheumatologist for further evaluation and potential referral to [facility].   Will continue to with cardiology as scheduled next week.   She will see Dr. Shelah on 10/09/24 to discuss PEF.    He has very mild ankle swelling for which she is advised to elevate and use compression stockings.   Her potassium is slightly low. I have sent her more potassium supplements to take once daily for a few days.   The patient was advised to call immediately if she has any concerning symptoms in the interval. The patient voices understanding of current disease status and treatment options and is in agreement with the current care plan. All questions were answered. The patient knows to call the clinic with any problems, questions or concerns. We can certainly see the patient much sooner if necessary    No orders of the defined types were placed in this encounter.    The total time spent in the appointment was 20-29 minutes  Jerzy Crotteau L Raymont Andreoni, PA-C 08/26/24

## 2024-08-26 ENCOUNTER — Inpatient Hospital Stay: Attending: Hematology and Oncology

## 2024-08-26 ENCOUNTER — Inpatient Hospital Stay: Admitting: Physician Assistant

## 2024-08-26 VITALS — BP 137/81 | HR 98 | Temp 97.3°F | Resp 16 | Wt 91.6 lb

## 2024-08-26 DIAGNOSIS — Z5111 Encounter for antineoplastic chemotherapy: Secondary | ICD-10-CM

## 2024-08-26 DIAGNOSIS — C3491 Malignant neoplasm of unspecified part of right bronchus or lung: Secondary | ICD-10-CM | POA: Diagnosis not present

## 2024-08-26 DIAGNOSIS — M329 Systemic lupus erythematosus, unspecified: Secondary | ICD-10-CM | POA: Diagnosis not present

## 2024-08-26 DIAGNOSIS — E876 Hypokalemia: Secondary | ICD-10-CM

## 2024-08-26 DIAGNOSIS — C3411 Malignant neoplasm of upper lobe, right bronchus or lung: Secondary | ICD-10-CM | POA: Insufficient documentation

## 2024-08-26 LAB — CBC WITH DIFFERENTIAL (CANCER CENTER ONLY)
Abs Immature Granulocytes: 0.08 K/uL — ABNORMAL HIGH (ref 0.00–0.07)
Basophils Absolute: 0.1 K/uL (ref 0.0–0.1)
Basophils Relative: 1 %
Eosinophils Absolute: 0.2 K/uL (ref 0.0–0.5)
Eosinophils Relative: 3 %
HCT: 34.1 % — ABNORMAL LOW (ref 36.0–46.0)
Hemoglobin: 11.2 g/dL — ABNORMAL LOW (ref 12.0–15.0)
Immature Granulocytes: 1 %
Lymphocytes Relative: 23 %
Lymphs Abs: 2.2 K/uL (ref 0.7–4.0)
MCH: 32.6 pg (ref 26.0–34.0)
MCHC: 32.8 g/dL (ref 30.0–36.0)
MCV: 99.1 fL (ref 80.0–100.0)
Monocytes Absolute: 1.2 K/uL — ABNORMAL HIGH (ref 0.1–1.0)
Monocytes Relative: 12 %
Neutro Abs: 6 K/uL (ref 1.7–7.7)
Neutrophils Relative %: 60 %
Platelet Count: 419 K/uL — ABNORMAL HIGH (ref 150–400)
RBC: 3.44 MIL/uL — ABNORMAL LOW (ref 3.87–5.11)
RDW: 19.6 % — ABNORMAL HIGH (ref 11.5–15.5)
WBC Count: 9.8 K/uL (ref 4.0–10.5)
nRBC: 0 % (ref 0.0–0.2)

## 2024-08-26 LAB — CMP (CANCER CENTER ONLY)
ALT: 11 U/L (ref 0–44)
AST: 23 U/L (ref 15–41)
Albumin: 3.6 g/dL (ref 3.5–5.0)
Alkaline Phosphatase: 115 U/L (ref 38–126)
Anion gap: 10 (ref 5–15)
BUN: 13 mg/dL (ref 6–20)
CO2: 25 mmol/L (ref 22–32)
Calcium: 8.4 mg/dL — ABNORMAL LOW (ref 8.9–10.3)
Chloride: 104 mmol/L (ref 98–111)
Creatinine: 1.1 mg/dL — ABNORMAL HIGH (ref 0.44–1.00)
GFR, Estimated: >60 mL/min (ref >60)
Glucose, Bld: 104 mg/dL — ABNORMAL HIGH (ref 70–99)
Potassium: 3.2 mmol/L — ABNORMAL LOW (ref 3.5–5.1)
Sodium: 139 mmol/L (ref 135–145)
Total Bilirubin: 0.3 mg/dL (ref 0.0–1.2)
Total Protein: 7.5 g/dL (ref 6.5–8.1)

## 2024-08-26 LAB — TOTAL PROTEIN, URINE DIPSTICK: Protein, ur: 300 mg/dL — AB

## 2024-08-26 MED ORDER — POTASSIUM CHLORIDE CRYS ER 20 MEQ PO TBCR: 20.0000 meq | EXTENDED_RELEASE_TABLET | Freq: Every day | ORAL | 0 refills | Status: DC

## 2024-08-26 MED ORDER — DEXAMETHASONE SODIUM PHOSPHATE 10 MG/ML IJ SOLN
10.0000 mg | Freq: Once | INTRAMUSCULAR | Status: AC
  Administered 2024-08-26: 10 mg via INTRAVENOUS
  Filled 2024-08-26: qty 1

## 2024-08-26 MED ORDER — PROCHLORPERAZINE MALEATE 10 MG PO TABS
10.0000 mg | ORAL_TABLET | Freq: Once | ORAL | Status: AC
  Administered 2024-08-26: 10 mg via ORAL
  Filled 2024-08-26: qty 1

## 2024-08-26 MED ORDER — SODIUM CHLORIDE 0.9 % IV SOLN
500.0000 mg/m2 | Freq: Once | INTRAVENOUS | Status: AC
  Administered 2024-08-26: 700 mg via INTRAVENOUS
  Filled 2024-08-26: qty 20

## 2024-08-26 MED ORDER — SODIUM CHLORIDE 0.9 % IV SOLN: Freq: Once | INTRAVENOUS | Status: AC

## 2024-08-26 NOTE — Patient Instructions (Signed)
 [facility] - A DEPT OF [place]  Discharge Instructions: Thank you for choosing [facility] to provide your oncology and hematology care.   If you have a lab appointment with the Cancer Center, please go directly to the Cancer Center and check in at the registration area.   Wear comfortable clothing and clothing appropriate for easy access to any Portacath or PICC line.   We strive to give you quality time with your provider. You may need to reschedule your appointment if you arrive late (15 or more minutes).  Arriving late affects you and other patients whose appointments are after yours.  Also, if you miss three or more appointments without notifying the office, you may be dismissed from the clinic at the provider's discretion.      For prescription refill requests, have your pharmacy contact our office and allow 72 hours for refills to be completed.    Today you received the following chemotherapy and/or immunotherapy agents Mvasi  and alimta       To help prevent nausea and vomiting after your treatment, we encourage you to take your nausea medication as directed.  BELOW ARE SYMPTOMS THAT SHOULD BE REPORTED IMMEDIATELY: *FEVER GREATER THAN 100.4 F (38 C) OR HIGHER *CHILLS OR SWEATING *NAUSEA AND VOMITING THAT IS NOT CONTROLLED WITH YOUR NAUSEA MEDICATION *UNUSUAL SHORTNESS OF BREATH *UNUSUAL BRUISING OR BLEEDING *URINARY PROBLEMS (pain or burning when urinating, or frequent urination) *BOWEL PROBLEMS (unusual diarrhea, constipation, pain near the anus) TENDERNESS IN MOUTH AND THROAT WITH OR WITHOUT PRESENCE OF ULCERS (sore throat, sores in mouth, or a toothache) UNUSUAL RASH, SWELLING OR PAIN  UNUSUAL VAGINAL DISCHARGE OR ITCHING   Items with * indicate a potential emergency and should be followed up as soon as possible or go to the Emergency Department if any problems should occur.  Please show the CHEMOTHERAPY ALERT CARD or  IMMUNOTHERAPY ALERT CARD at check-in to the Emergency Department and triage nurse.  Should you have questions after your visit or need to cancel or reschedule your appointment, please contact [facility] - A DEPT OF [facility]  Dept: [phone]  and follow the prompts.  Office hours are 8:00 a.m. to 4:30 p.m. Monday - Friday. Please note that voicemails left after 4:00 p.m. may not be returned until the following business day.  We are closed weekends and major holidays. You have access to a nurse at all times for urgent questions. Please call the main number to the clinic Dept: [phone] and follow the prompts.   For any non-urgent questions, you may also contact your provider using MyChart. We now offer e-Visits for anyone 26 and older to request care online for non-urgent symptoms. For details visit [email or web address].   Also download the MyChart app! Go to the app store, search MyChart, open the app, select [place], and log in with your MyChart username and password.

## 2024-09-03 ENCOUNTER — Ambulatory Visit

## 2024-09-03 VITALS — BP 120/90 | HR 84 | Ht 60.0 in | Wt 89.0 lb

## 2024-09-03 DIAGNOSIS — I10 Essential (primary) hypertension: Secondary | ICD-10-CM

## 2024-09-03 DIAGNOSIS — E782 Mixed hyperlipidemia: Secondary | ICD-10-CM | POA: Diagnosis not present

## 2024-09-03 DIAGNOSIS — Z79899 Other long term (current) drug therapy: Secondary | ICD-10-CM | POA: Insufficient documentation

## 2024-09-03 DIAGNOSIS — E785 Hyperlipidemia, unspecified: Secondary | ICD-10-CM | POA: Insufficient documentation

## 2024-09-03 DIAGNOSIS — Z01810 Encounter for preprocedural cardiovascular examination: Secondary | ICD-10-CM

## 2024-09-03 DIAGNOSIS — R809 Proteinuria, unspecified: Secondary | ICD-10-CM | POA: Insufficient documentation

## 2024-09-03 DIAGNOSIS — C3490 Malignant neoplasm of unspecified part of unspecified bronchus or lung: Secondary | ICD-10-CM | POA: Insufficient documentation

## 2024-09-03 MED ORDER — ROSUVASTATIN CALCIUM 5 MG PO TABS: 5.0000 mg | ORAL_TABLET | ORAL | 3 refills | Status: AC

## 2024-09-03 NOTE — Assessment & Plan Note (Signed)
 Lipid panel from 07/01/2024 total cholesterol 253, HDL 73, LDL 153, triglycerides 153. Suboptimal. Would recommend target LDL below 100 mg/dL. Continue with dietary and lifestyle modifications. Discussed further use of statins for lipid management and also for overall cardiovascular risk reduction. Potential side effects such as muscle aches, myopathies, LFT monitoring reviewed. Patient is agreeable to start. Start Crestor  5 mg 3 times a week. If tolerating well, we will obtain complete metabolic panel after 2 months to monitor liver functions.  Repeat lipid panel tentatively prior to next follow-up visit in 1 year.

## 2024-09-03 NOTE — Assessment & Plan Note (Signed)
 No significant cardiac symptoms at this time. Evaluation will cardiac workup with echocardiogram and cardiac CT were unremarkable.  From cardiac standpoint okay to proceed with her dental surgeries and upcoming procedures and surgeries for her lung, she remains at low risk for perioperative cardiac complications.  No need for daily aspirin  at this time.

## 2024-09-03 NOTE — Progress Notes (Signed)
 Cardiology Consultation:    Date:  09/03/2024   ID:  Kirsten Williams, DOB [birth year], MRN [id number]  PCP:  Kirsten Lot, PA-C  Cardiologist:  Kirsten SAUNDERS Derold Dorsch, MD   Referring MD: Kirsten Lot, PA-C   Chief Complaint  Patient presents with   Follow-up     ASSESSMENT AND PLAN:   [name] 53 year old woman history of lung cancer s/p chemotherapy [ongoing sessions every 3 weeks for the past 2 years], cutaneous lupus, hypertension, hyperlipidemia, and adrenal nodule bilateral on prior CT imaging [following up with PCP], GERD, anxiety, depression, former smoker [quit in 2016], with no significant coronary atherosclerosis on cardiac CT coronary angiogram 07/30/2024, echocardiogram August 2025 with LVEF 50 to 55% otherwise no significant valve abnormalities and noted normal wall motion, heart monitor 7-day study July 2025 showed no major abnormalities other than 2 short runs of SVT with the longest being 7 beats that was asymptomatic.  Her symptoms during heart monitor mostly correlated with sinus rhythm and sinus tachycardia. Reviewed cardiac workup extensively with her today in the clinic.  Problem List Items Addressed This Visit     Preop cardiovascular exam   No significant cardiac symptoms at this time. Evaluation will cardiac workup with echocardiogram and cardiac CT were unremarkable.  From cardiac standpoint okay to proceed with her dental surgeries and upcoming procedures and surgeries for her lung, she remains at low risk for perioperative cardiac complications.  No need for daily aspirin  at this time.       Hypertension, essential, benign - Primary   Suboptimal diastolic numbers today. Continue with amlodipine 10 mg once daily Continue carvedilol  6.25 mg twice daily Continue to monitor and target blood pressure below 130/80 mmHg. If consistently above the target, titrate up carvedilol .      Relevant Medications   rosuvastatin  (CRESTOR ) 5 MG tablet (Start on  09/04/2024)   Other Relevant Orders   Comprehensive metabolic panel with GFR   Hyperlipidemia   Lipid panel from 07/01/2024 total cholesterol 253, HDL 73, LDL 153, triglycerides 153. Suboptimal. Would recommend target LDL below 100 mg/dL. Continue with dietary and lifestyle modifications. Discussed further use of statins for lipid management and also for overall cardiovascular risk reduction. Potential side effects such as muscle aches, myopathies, LFT monitoring reviewed. Patient is agreeable to start. Start Crestor  5 mg 3 times a week. If tolerating well, we will obtain complete metabolic panel after 2 months to monitor liver functions.  Repeat lipid panel tentatively prior to next follow-up visit in 1 year.       Relevant Medications   rosuvastatin  (CRESTOR ) 5 MG tablet (Start on 09/04/2024)    Return to clinic tentatively in 1 year.   History of Present Illness:    Kirsten Williams is a 53 y.o. female who is being seen today for follow-up visit. PCP is Kirsten Lot, PA-C. Last visit with me in the office was 06/30/2024.  history of lung cancer s/p chemotherapy [ongoing sessions every 3 weeks for the past 2 years], cutaneous lupus, hypertension, hyperlipidemia, and adrenal nodule bilateral on prior CT imaging [following up with PCP], GERD, anxiety, depression, former smoker [quit in 2016]   Seen for symptoms of progressive effort of intolerance and a sense of palpitations during exercise.  Cardiac CT coronary angiogram completed 07/30/2024 noted calcium  score 0, CAD RADS 0 study.  Atherosclerosis of aorta was noted.  2 cm spiculated right lower lobe lung nodule consistent with her known history of lung cancer and prior imaging.  Echocardiogram  from 07/24/2024 noted normal LVEF 50 to 55%, global longitudinal strain abnormal reduced at -13.8 but the other diastolic parameters were normal.  No major valve abnormalities.  Heart monitor 7-day study completed from 06/30/2024 noted  predominantly normal sinus rhythm with average heart rate 94/min [ranging from 60 to 152 bpm].  Rare ventricular and supraventricular ectopy burden.  Patient triggered events correlated with normal heart rhythm with heart rates ranging from 76 to 133 bpm.  2 short runs of SVT were observed with the longest being 7 beats that was asymptomatic.  Mentions overall doing well. Once in a while she does continue to feel a sense of heart racing or burning in the chest but able to do her day-to-day activities and feels her breathing is better in comparison to a few months ago. Does not routinely check blood pressures at home but when she does check at times systolic or up to 130s.  She forgot to bring her log to the visit today.  Good compliance with her medications. Past Medical History:  Diagnosis Date   Adenocarcinoma of right lung (HCC) 02/15/2022   Adenocarcinoma of right lung, stage 4 (HCC) 06/14/2021   Adenocarcinoma, lung, right (HCC) 05/03/2021   Kirsten Williams is a 53 y.o. female with a history of lung nodules dating back to 2019 who is referred in consultation with Rankin Dike, PA-C for assessment and management. She is a former smoker having quit in 2016. 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 She missed her screening CT last year and imaging was ordered last month. 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   Hypercalcemia 08/23/2022   Hypertension, essential, benign    Hypokalemia 04/22/2024   INSOMNIA NOS 02/06/2007   Qualifier: Diagnosis of   By: Kivett,  Whitney         MIGRAINE, UNSPEC., W/O INTRACTABLE MIGRAINE 02/06/2007   Qualifier: Diagnosis of   By: Damien Folks      Replacing diagnoses that were inactivated after the 03/11/23 regulatory import     Osteoporosis 03/01/2022   Palpitations    Pleuritic chest pain 10/24/2022   Post-op pain 05/04/2021   Rheumatoid arthritis (HCC) 02/15/2022   Skin infection 11/13/2023   SLE (systemic lupus erythematosus) (HCC) 05/03/2021   SLE (systemic lupus erythematosus) (HCC) 05/03/2021   TOBACCO DEPENDENCE 02/06/2007   Qualifier: Diagnosis of   By: Damien Folks          Past Surgical History:  Procedure Laterality Date   ABDOMINAL HYSTERECTOMY     BRONCHIAL BIOPSY  04/24/2021   Procedure: BRONCHIAL BIOPSIES;  Surgeon: Shelah Lamar RAMAN, MD;  Location: [facility] ENDOSCOPY;  Service: Pulmonary;;   BRONCHIAL BRUSHINGS  04/24/2021   Procedure: BRONCHIAL BRUSHINGS;  Surgeon: Shelah Lamar RAMAN, MD;  Location: [facility] ENDOSCOPY;  Service: Pulmonary;;   BRONCHIAL NEEDLE ASPIRATION BIOPSY  04/24/2021   Procedure: BRONCHIAL NEEDLE ASPIRATION BIOPSIES;  Surgeon: Shelah Lamar RAMAN, MD;  Location: [facility] ENDOSCOPY;  Service: Pulmonary;;   BRONCHIAL WASHINGS  04/24/2021   Procedure: BRONCHIAL WASHINGS;  Surgeon: Shelah Lamar RAMAN, MD;  Location: [facility] ENDOSCOPY;  Service: Pulmonary;;   CESAREAN SECTION  11/09/1990   DILATION AND CURETTAGE OF UTERUS Bilateral 09/09/1996   FIDUCIAL MARKER PLACEMENT  04/24/2021   Procedure: FIDUCIAL MARKER PLACEMENT;  Surgeon: Shelah Lamar RAMAN, MD;  Location[facility] ENDOSCOPY;  Service: Pulmonary;;   TONSILLECTOMY  12/10/1977   VIDEO BRONCHOSCOPY WITH ENDOBRONCHIAL NAVIGATION Bilateral 04/24/2021   Procedure: VIDEO BRONCHOSCOPY WITH ENDOBRONCHIAL NAVIGATION;  Surgeon: Shelah Lamar RAMAN, MD;  Location: [facility] ENDOSCOPY;  Service: Pulmonary;  Laterality: Bilateral;    Current Medications: Current Meds  Medication Sig   Acetaminophen  (TYLENOL ) 325 MG CAPS Take 325 mg by mouth daily as needed (pain).   amLODipine  (NORVASC) 10 MG tablet Take 10 mg by mouth daily.   Calcium  Carbonate-Vit D-Min (CALCIUM  1200 PO) Take 1 capsule by mouth daily.   carvedilol  (COREG ) 6.25 MG tablet Take 1 tablet (6.25 mg total) by mouth 2 (two) times daily.   CLOBETASOL PROPIONATE E 0.05 % emollient cream Apply topically.   cyclobenzaprine (FLEXERIL) 10 MG tablet Take 10 mg by mouth at bedtime as needed for muscle spasms.   denosumab  (PROLIA ) 60 MG/ML SOSY injection Inject 60 mg into the skin every 6 (six) months.   dicyclomine (BENTYL) 10 MG capsule Take 10 mg by mouth every 6 (six) hours as needed for spasms.   diphenhydrAMINE  (BENADRYL ) 25 MG tablet Take 25 mg by mouth daily as needed for allergies.   doxycycline (VIBRA-TABS) 100 MG tablet Take 100 mg by mouth daily.   famotidine  (PEPCID ) 40 MG tablet Take 40 mg by mouth daily.   fluticasone (FLONASE) 50 MCG/ACT nasal spray Place 2 sprays into both nostrils daily.   folic acid  (FOLVITE ) 1 MG tablet TAKE 1 TABLET(1 MG) BY MOUTH DAILY   gabapentin (NEURONTIN) 300 MG capsule Take 300 mg by mouth at bedtime.   hydrocortisone  1 % lotion Apply 1 application topically 2 (two) times daily.   hydroxychloroquine (PLAQUENIL) 200 MG tablet Take 200 mg by mouth 2 (two) times daily.   Multiple Vitamins-Minerals (MULTI ADULT GUMMIES) CHEW Chew 2 tablets by mouth daily.   omeprazole (PRILOSEC) 40 MG capsule Take 40 mg by mouth daily as needed (acid reflux).   oxyCODONE -acetaminophen  (PERCOCET/ROXICET) 5-325 MG tablet Take 1 tablet by mouth every 8 (eight) hours as needed for severe pain (pain score 7-10).   potassium chloride  SA (KLOR-CON  M) 20 MEQ tablet Take 1 tablet (20 mEq total) by mouth daily.   Probiotic Product (PROBIOTIC-10 PO) Take 1 tablet by mouth daily.   prochlorperazine  (COMPAZINE ) 10 MG tablet TAKE 1 TABLET(10 MG) BY MOUTH EVERY 6 HOURS AS NEEDED   rizatriptan (MAXALT-MLT) 10 MG disintegrating tablet Take 10 mg by mouth 2 (two) times daily as needed for migraine.   [START  ON 09/04/2024] rosuvastatin  (CRESTOR ) 5 MG tablet Take 1 tablet (5 mg total) by mouth 3 (three) times a week.   tetrahydrozoline 0.05 % ophthalmic solution Place 1 drop into both eyes once. Systane Eye Drops once daily both eyes   triamcinolone ointment (KENALOG) 0.5 % Apply 1 Application topically 3 (three) times daily.     Allergies:   Clindamycin/lincomycin, Losartan, and Carboplatin    Social History   Socioeconomic History   Marital status: Married    Spouse name: Not on file   Number of children: Not on file   Years of education: Not on file   Highest education level: Not on file  Occupational History   Not on file  Tobacco Use   Smoking status: Former  Current packs/day: 0.00    Average packs/day: 1.5 packs/day for 32.0 years (48.0 ttl pk-yrs)    Types: Cigarettes    Start date: 12/10/1982    Quit date: 12/10/2014    Years since quitting: 9.7   Smokeless tobacco: Never  Vaping Use   Vaping status: Never Used  Substance and Sexual Activity   Alcohol use: Yes    Alcohol/week: 3.0 standard drinks of alcohol    Types: 3 Shots of liquor per week    Comment: daily   Drug use: Never   Sexual activity: Yes  Other Topics Concern   Not on file  Social History Narrative   Right Handed    Lives in a two story home. Lives with husband.   Social Drivers of Corporate investment banker Strain: Not on file  Food Insecurity: No Food Insecurity (08/05/2024)   Hunger Vital Sign    Worried About Running Out of Food in the Last Year: Never true    Ran Out of Food in the Last Year: Never true  Transportation Needs: No Transportation Needs (08/05/2024)   PRAPARE - Administrator, Civil Service (Medical): No    Lack of Transportation (Non-Medical): No  Physical Activity: Not on file  Stress: Not on file  Social Connections: Not on file     Family History: The patient's family history includes Cancer in her father; Heart Problems in her mother; Hypertension in her sister;  Neuropathy in her father; Stroke in her father and mother. ROS:   Please see the history of present illness.    All 14 point review of systems negative except as described per history of present illness.  EKGs/Labs/Other Studies Reviewed:    The following studies were reviewed today:   EKG:       Recent Labs: 08/26/2024: ALT 11; BUN 13; Creatinine 1.10; Hemoglobin 11.2; Platelet Count 419; Potassium 3.2; Sodium 139  Recent Lipid Panel    Component Value Date/Time   CHOL 253 (H) 07/01/2024 0902   TRIG 153 (H) 07/01/2024 0902   HDL 73 07/01/2024 0902   CHOLHDL 3.5 07/01/2024 0902   LDLCALC 153 (H) 07/01/2024 0902    Physical Exam:    VS:  BP (!) 120/90   Pulse 84   Ht 5' (1.524 m)   Wt 89 lb (40.4 kg)   SpO2 99%   BMI 17.38 kg/m     Wt Readings from Last 3 Encounters:  09/03/24 89 lb (40.4 kg)  08/26/24 91 lb 9.6 oz (41.5 kg)  08/05/24 92 lb (41.7 kg)     GENERAL:  Well nourished, well developed in no acute distress NECK: No JVD CARDIAC: RRR, S1 and S2 present, no murmurs, no rubs, no gallops CHEST:  Clear to auscultation without rales, wheezing or rhonchi  Extremities: No pitting pedal edema. Pulses bilaterally symmetric with radial 2+ and dorsalis pedis 2+ NEUROLOGIC:  Alert and oriented x 3  Medication Adjustments/Labs and Tests Ordered: Current medicines are reviewed at length with the patient today.  Concerns regarding medicines are outlined above.  Orders Placed This Encounter  Procedures   Comprehensive metabolic panel with GFR   Meds ordered this encounter  Medications   rosuvastatin  (CRESTOR ) 5 MG tablet    Sig: Take 1 tablet (5 mg total) by mouth 3 (three) times a week.    Dispense:  48 tablet    Refill:  3    Signed, Paeton Studer reddy Warwick Nick, MD, MPH, [facility]. 09/03/2024 8:29 AM  [facility]

## 2024-09-03 NOTE — Assessment & Plan Note (Signed)
 Suboptimal diastolic numbers today. Continue with amlodipine 10 mg once daily Continue carvedilol  6.25 mg twice daily Continue to monitor and target blood pressure below 130/80 mmHg. If consistently above the target, titrate up carvedilol .

## 2024-09-03 NOTE — Patient Instructions (Signed)
 Medication Instructions:   START: Crestor  5mg  1 tablet 3 times a week   Lab Work: Your physician recommends that you return for lab work in: November You can come Monday through Friday 8:30 am to 12:00 pm and 1:15 to 4:30. You do not need to make an appointment as the order has already been placed. The labs you are going to have done are CMP   Testing/Procedures: None Ordered   Follow-Up: At [facility], you and your health needs are our priority.  As part of our continuing mission to provide you with exceptional heart care, we have created designated Provider Care Teams.  These Care Teams include your primary Cardiologist (physician) and Advanced Practice Providers (APPs -  Physician Assistants and Nurse Practitioners) who all work together to provide you with the care you need, when you need it.  We recommend signing up for the patient portal called MyChart.  Sign up information is provided on this After Visit Summary.  MyChart is used to connect with patients for Virtual Visits (Telemedicine).  Patients are able to view lab/test results, encounter notes, upcoming appointments, etc.  Non-urgent messages can be sent to your provider as well.   To learn more about what you can do with MyChart, go to [email or web address].    Your next appointment:   12 month(s)  The format for your next appointment:   In Person  Provider:   Lamar Fitch, MD    Other Instructions NA

## 2024-09-14 ENCOUNTER — Ambulatory Visit: Admitting: Neurology

## 2024-09-16 ENCOUNTER — Other Ambulatory Visit

## 2024-09-16 ENCOUNTER — Ambulatory Visit

## 2024-09-16 ENCOUNTER — Ambulatory Visit: Admitting: Internal Medicine

## 2024-10-02 NOTE — Progress Notes (Signed)
 [facility] OFFICE PROGRESS NOTE  [name], PA-C [address] [place] [address]  DIAGNOSIS: Stage IV (T4, N2, M1 a) non-small cell lung cancer, adenocarcinoma presented with multifocal disease involving the right upper lobe, right lower lobe as well as suspicious lower paratracheal lymphadenopathy and groundglass nodules in the left lung diagnosed in May 2022. The patient had molecular studies performed by [facility] [employer] and that showed no detectable mutation but this is likely secondary to low-level circulating free tumor DNA   PRIOR THERAPY: None  CURRENT THERAPY: Palliative systemic chemotherapy with carboplatin  for AUC of 5, Alimta  500 Mg/M2 and Avastin  15 Mg/KG every 3 weeks.  The patient is not a great candidate for immunotherapy in the event that she has a genetic mutation.  She is status post 55 cycles. Starting from cycle #6, the patient will start maintenance Alimta  and Avastin .   INTERVAL HISTORY: JOZLYNN PLAIA 53 y.o. female returns to the clinic today for a follow up visit accompanied by her sister. The patient was last see in the clinic by myself on 08/26/24. The patient has been out of town to [place]  in [place]  for most of this month.    She is feeling well.   She is experiencing elevated urine protein levels. She is scheduled for a dental procedure on Monday. She also recently saw a new nephrologist.   She recently traveled to [place] , where she experienced dizziness and lightheadedness due to altitude changes. She managed these symptoms by staying hydrated, drinking water and Gatorade, as advised by her daughter.  She has been experiencing swelling in her feet. Her nephrologist has prescribed furosemide, but she has not yet picked up the medication. She has been reducing salt intake to help manage the swelling.  Her appetite has improved significantly. She gained weight.   Denies any fever, chills, or night sweats.  She reports her breathing is  great.  She denies any unusual cough.  She denies any abnormal bleeding or bruising.  She denies any nausea, vomiting, diarrhea, or constipation.  Denies any headache or visual changes.     She is scheduled to see Dr. Fayetta on 10/09/24 to discuss if she is a candidate for PEF.    She is here today for evaluation and repeat blood work before undergoing cycle #56.    MEDICAL HISTORY: Past Medical History:  Diagnosis Date   Adenocarcinoma of right lung (HCC) 02/15/2022   Adenocarcinoma of right lung, stage 4 (HCC) 06/14/2021   Adenocarcinoma, lung, right (HCC) 05/03/2021   NAYANA LENIG is a 53 y.o. female with a history of lung nodules dating back to 2019 who is referred in consultation with Lot Montey, PA-C for assessment and management. She is a former smoker having quit in 2016. 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 She missed her screening CT last year and imaging was ordered last month. 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   Hypercalcemia 08/23/2022   Hypertension, essential, benign    Hypokalemia 04/22/2024   INSOMNIA NOS 02/06/2007  Qualifier: Diagnosis of   By: Damien Folks         MIGRAINE, UNSPEC., W/O INTRACTABLE MIGRAINE 02/06/2007   Qualifier: Diagnosis of   By: Damien Folks      Replacing diagnoses that were inactivated after the 03/11/23 regulatory import     Osteoporosis 03/01/2022   Palpitations    Pleuritic chest pain 10/24/2022   Post-op pain 05/04/2021   Rheumatoid arthritis (HCC) 02/15/2022   Skin infection 11/13/2023    SLE (systemic lupus erythematosus) (HCC) 05/03/2021   SLE (systemic lupus erythematosus) (HCC) 05/03/2021   TOBACCO DEPENDENCE 02/06/2007   Qualifier: Diagnosis of   By: Kivett, Whitney          ALLERGIES:  is allergic to clindamycin/lincomycin, losartan, and carboplatin .  MEDICATIONS:  Current Outpatient Medications  Medication Sig Dispense Refill   Acetaminophen  (TYLENOL ) 325 MG CAPS Take 325 mg by mouth daily as needed (pain).     amLODipine (NORVASC) 10 MG tablet Take 10 mg by mouth daily.     Calcium  Carbonate-Vit D-Min (CALCIUM  1200 PO) Take 1 capsule by mouth daily.     carvedilol  (COREG ) 6.25 MG tablet Take 1 tablet (6.25 mg total) by mouth 2 (two) times daily. 180 tablet 3   CLOBETASOL PROPIONATE E 0.05 % emollient cream Apply topically.     cyclobenzaprine (FLEXERIL) 10 MG tablet Take 10 mg by mouth at bedtime as needed for muscle spasms.     denosumab  (PROLIA ) 60 MG/ML SOSY injection Inject 60 mg into the skin every 6 (six) months.     dicyclomine (BENTYL) 10 MG capsule Take 10 mg by mouth every 6 (six) hours as needed for spasms.     diphenhydrAMINE  (BENADRYL ) 25 MG tablet Take 25 mg by mouth daily as needed for allergies.     doxycycline (VIBRA-TABS) 100 MG tablet Take 100 mg by mouth daily.     famotidine  (PEPCID ) 40 MG tablet Take 40 mg by mouth daily.     fluticasone (FLONASE) 50 MCG/ACT nasal spray Place 2 sprays into both nostrils daily.     folic acid  (FOLVITE ) 1 MG tablet TAKE 1 TABLET(1 MG) BY MOUTH DAILY 30 tablet 4   gabapentin (NEURONTIN) 300 MG capsule Take 300 mg by mouth at bedtime.     hydrocortisone  1 % lotion Apply 1 application topically 2 (two) times daily. 118 mL 0   hydroxychloroquine (PLAQUENIL) 200 MG tablet Take 200 mg by mouth 2 (two) times daily.     Multiple Vitamins-Minerals (MULTI ADULT GUMMIES) CHEW Chew 2 tablets by mouth daily.     omeprazole (PRILOSEC) 40 MG capsule Take 40 mg by mouth daily as needed (acid reflux).      oxyCODONE -acetaminophen  (PERCOCET/ROXICET) 5-325 MG tablet Take 1 tablet by mouth every 8 (eight) hours as needed for severe pain (pain score 7-10). 30 tablet 0   potassium chloride  SA (KLOR-CON  M) 20 MEQ tablet Take 1 tablet (20 mEq total) by mouth daily. 8 tablet 0   Probiotic Product (PROBIOTIC-10 PO) Take 1 tablet by mouth daily.     prochlorperazine  (COMPAZINE ) 10 MG tablet TAKE 1 TABLET(10 MG) BY MOUTH EVERY 6 HOURS AS NEEDED 30 tablet 2   rizatriptan (MAXALT-MLT) 10 MG disintegrating tablet Take 10 mg by mouth 2 (two) times daily as needed for migraine.     rosuvastatin  (CRESTOR ) 5 MG tablet Take 1 tablet (5 mg total) by mouth 3 (three) times a week. 48 tablet 3   tetrahydrozoline 0.05 % ophthalmic solution Place 1 drop into both  eyes once. Systane Eye Drops once daily both eyes     triamcinolone ointment (KENALOG) 0.5 % Apply 1 Application topically 3 (three) times daily.     No current facility-administered medications for this visit.   Facility-Administered Medications Ordered in Other Visits  Medication Dose Route Frequency Provider Last Rate Last Admin   cyanocobalamin  (VITAMIN B12) injection 1,000 mcg  1,000 mcg Intramuscular Once Luismiguel Lamere L, PA-C       dexamethasone  (DECADRON ) injection 10 mg  10 mg Intravenous Once Mohamed, Mohamed, MD       PEMEtrexed  Disodium (ALIMTA ) 700 mg in sodium chloride  0.9 % 100 mL chemo infusion  500 mg/m2 (Order-Specific) Intravenous Once Mohamed, Mohamed, MD       prochlorperazine  (COMPAZINE ) tablet 10 mg  10 mg Oral Once Sherrod Sherrod, MD        SURGICAL HISTORY:  Past Surgical History:  Procedure Laterality Date   ABDOMINAL HYSTERECTOMY     BRONCHIAL BIOPSY  04/24/2021   Procedure: BRONCHIAL BIOPSIES;  Surgeon: Shelah Lamar RAMAN, MD;  Location: [facility] ENDOSCOPY;  Service: Pulmonary;;   BRONCHIAL BRUSHINGS  04/24/2021   Procedure: BRONCHIAL BRUSHINGS;  Surgeon: Shelah Lamar RAMAN, MD;  Location: [facility] ENDOSCOPY;  Service: Pulmonary;;    BRONCHIAL NEEDLE ASPIRATION BIOPSY  04/24/2021   Procedure: BRONCHIAL NEEDLE ASPIRATION BIOPSIES;  Surgeon: Shelah Lamar RAMAN, MD;  Location: [facility] ENDOSCOPY;  Service: Pulmonary;;   BRONCHIAL WASHINGS  04/24/2021   Procedure: BRONCHIAL WASHINGS;  Surgeon: Shelah Lamar RAMAN, MD;  Location: [facility] ENDOSCOPY;  Service: Pulmonary;;   CESAREAN SECTION  11/09/1990   DILATION AND CURETTAGE OF UTERUS Bilateral 09/09/1996   FIDUCIAL MARKER PLACEMENT  04/24/2021   Procedure: FIDUCIAL MARKER PLACEMENT;  Surgeon: Shelah Lamar RAMAN, MD;  Location: [facility] ENDOSCOPY;  Service: Pulmonary;;   TONSILLECTOMY  12/10/1977   VIDEO BRONCHOSCOPY WITH ENDOBRONCHIAL NAVIGATION Bilateral 04/24/2021   Procedure: VIDEO BRONCHOSCOPY WITH ENDOBRONCHIAL NAVIGATION;  Surgeon: Shelah Lamar RAMAN, MD;  Location: [facility] ENDOSCOPY;  Service: Pulmonary;  Laterality: Bilateral;    REVIEW OF SYSTEMS:   Review of Systems  Constitutional: Negative for appetite change, chills,  and fever.  HENT: Negative for mouth sores, nosebleeds, sore throat and trouble swallowing.   Eyes: Negative for eye problems and icterus.  Respiratory: Positive for occasional shortness of breath (none at this time). Negative for cough, hemoptysis, and wheezing.   Cardiovascular: Negative for chest pain (gets chest tightness after treatment but none at this time). Mild ankle swelling.  Gastrointestinal: Positive for intermittent chronic nausea. Negative for constipation, diarrhea, and vomiting.  Genitourinary: Negative for bladder incontinence, difficulty urinating, dysuria, frequency and hematuria.   Musculoskeletal: Negative for back pain, gait problem, neck pain and neck stiffness.  Skin: Improved rash and itching.  Neurological: Negative for dizziness, extremity weakness, gait problem, headaches, and seizures.  Hematological: Negative for adenopathy. Does not bleed easily. She reports easy bruising.   Psychiatric/Behavioral: Negative for confusion, depression and sleep  disturbance. The patient is not nervous/anxious.     PHYSICAL EXAMINATION:  Blood pressure 138/71, pulse 89, temperature 97.6 F (36.4 C), temperature source Temporal, resp. rate 15, weight 96 lb 9.6 oz (43.8 kg), SpO2 100%.  ECOG PERFORMANCE STATUS: 1  Physical Exam  Constitutional: Oriented to person, place, and time and well-developed, well-nourished, and in no distress.  HENT:  Head: Normocephalic and atraumatic.  Mouth/Throat: Oropharynx is clear and moist. No oropharyngeal exudate.  Eyes: Conjunctivae are normal. Right eye exhibits no discharge. Left eye exhibits no discharge. No scleral icterus.  Neck:  Normal range of motion. Neck supple.  Cardiovascular: Normal rate, regular rhythm, normal heart sounds and intact distal pulses.   Pulmonary/Chest: Effort normal and breath sounds normal. No respiratory distress. No wheezes. No rales.  Abdominal: Soft. Bowel sounds are normal. Exhibits no distension and no mass. There is no tenderness.  Musculoskeletal: Normal range of motion. Exhibits no edema.  Lymphadenopathy:    No cervical adenopathy.  Neurological: Alert and oriented to person, place, and time. Exhibits normal muscle tone. Gait normal. Coordination normal.  Skin: Skin is warm and dry.  Not diaphoretic. No erythema. No pallor.  Psychiatric: Mood, memory and judgment normal.  Vitals reviewed.  LABORATORY DATA: Lab Results  Component Value Date   WBC 9.5 10/07/2024   HGB 10.2 (L) 10/07/2024   HCT 32.0 (L) 10/07/2024   MCV 101.3 (H) 10/07/2024   PLT 303 10/07/2024      Chemistry      Component Value Date/Time   NA 139 10/07/2024 0925   NA 142 07/01/2024 0902   K 3.4 (L) 10/07/2024 0925   CL 104 10/07/2024 0925   CO2 25 10/07/2024 0925   BUN 8 10/07/2024 0925   BUN 29 (H) 07/01/2024 0902   CREATININE 0.99 10/07/2024 0925   GLU 107 03/13/2021 0000      Component Value Date/Time   CALCIUM  9.1 10/07/2024 0925   ALKPHOS 94 10/07/2024 0925   AST 16 10/07/2024  0925   ALT 7 10/07/2024 0925   BILITOT 0.3 10/07/2024 0925       RADIOGRAPHIC STUDIES:  No results found.   ASSESSMENT/PLAN:  This is a very pleasant 53 year old Caucasian female diagnosed with stage IV (T4, N2, M1 a) non-small cell lung cancer, adenocarcinoma presented with multifocal disease involving the right upper lobe, right lower lobe as well as suspicious lower paratracheal lymphadenopathy and groundglass nodules in the left lung diagnosed in May 2022. The patient had molecular studies performed by [facility] [employer] and that showed no detectable mutation but this is likely secondary to low-level circulating free tumor DNA. If the patient has disease progression in the future, then we will likely retest her for molecular studies.      The patient is currently undergoing systemic chemotherapy with carboplatin  for an AUC 5, Alimta  500 mg per metered square, and and Avastin  15 mg/kg IV every 3 weeks.  She is status post 55 cycles.  Starting from cycle #7, the patient has been on maintenance treatment with Alimta  and Avastin .   Labs were reviewed. Her urine protein is 300. We will hold the avastin  today.  Recommend that she proceed with cycle #56  today as scheduled with alimta .    She will see Dr. Shelah on 10/09/24 to discuss PEF.    He has very mild ankle swelling for which she is advised to elevate and use compression stockings. She was given lasix by nephrology.    The patient was advised to call immediately if she has any concerning symptoms in the interval. The patient voices understanding of current disease status and treatment options and is in agreement with the current care plan. All questions were answered. The patient knows to call the clinic with any problems, questions or concerns. We can certainly see the patient much sooner if necessary    No orders of the defined types were placed in this encounter.   The total time spent in the appointment was 20-29  minutes  Mckoy Bhakta L Vahan Wadsworth, PA-C 10/07/24

## 2024-10-07 ENCOUNTER — Inpatient Hospital Stay: Attending: Hematology and Oncology

## 2024-10-07 ENCOUNTER — Telehealth: Payer: Self-pay

## 2024-10-07 ENCOUNTER — Inpatient Hospital Stay: Attending: Hematology and Oncology | Admitting: Physician Assistant

## 2024-10-07 VITALS — BP 138/71 | HR 89 | Temp 97.6°F | Resp 15 | Wt 96.6 lb

## 2024-10-07 DIAGNOSIS — C3411 Malignant neoplasm of upper lobe, right bronchus or lung: Secondary | ICD-10-CM | POA: Diagnosis present

## 2024-10-07 DIAGNOSIS — M810 Age-related osteoporosis without current pathological fracture: Secondary | ICD-10-CM | POA: Diagnosis not present

## 2024-10-07 DIAGNOSIS — Z5111 Encounter for antineoplastic chemotherapy: Secondary | ICD-10-CM | POA: Diagnosis present

## 2024-10-07 DIAGNOSIS — T50995D Adverse effect of other drugs, medicaments and biological substances, subsequent encounter: Secondary | ICD-10-CM | POA: Insufficient documentation

## 2024-10-07 DIAGNOSIS — C3491 Malignant neoplasm of unspecified part of right bronchus or lung: Secondary | ICD-10-CM

## 2024-10-07 LAB — CBC WITH DIFFERENTIAL (CANCER CENTER ONLY)
Abs Immature Granulocytes: 0.02 K/uL (ref 0.00–0.07)
Basophils Absolute: 0.2 K/uL — ABNORMAL HIGH (ref 0.0–0.1)
Basophils Relative: 2 %
Eosinophils Absolute: 0.2 K/uL (ref 0.0–0.5)
Eosinophils Relative: 2 %
HCT: 32 % — ABNORMAL LOW (ref 36.0–46.0)
Hemoglobin: 10.2 g/dL — ABNORMAL LOW (ref 12.0–15.0)
Immature Granulocytes: 0 %
Lymphocytes Relative: 30 %
Lymphs Abs: 2.9 K/uL (ref 0.7–4.0)
MCH: 32.3 pg (ref 26.0–34.0)
MCHC: 31.9 g/dL (ref 30.0–36.0)
MCV: 101.3 fL — ABNORMAL HIGH (ref 80.0–100.0)
Monocytes Absolute: 0.8 K/uL (ref 0.1–1.0)
Monocytes Relative: 8 %
Neutro Abs: 5.5 K/uL (ref 1.7–7.7)
Neutrophils Relative %: 58 %
Platelet Count: 303 K/uL (ref 150–400)
RBC: 3.16 MIL/uL — ABNORMAL LOW (ref 3.87–5.11)
RDW: 16.1 % — ABNORMAL HIGH (ref 11.5–15.5)
WBC Count: 9.5 K/uL (ref 4.0–10.5)
nRBC: 0 % (ref 0.0–0.2)

## 2024-10-07 LAB — CMP (CANCER CENTER ONLY)
ALT: 7 U/L (ref 0–44)
AST: 16 U/L (ref 15–41)
Albumin: 3.5 g/dL (ref 3.5–5.0)
Alkaline Phosphatase: 94 U/L (ref 38–126)
Anion gap: 10 (ref 5–15)
BUN: 8 mg/dL (ref 6–20)
CO2: 25 mmol/L (ref 22–32)
Calcium: 9.1 mg/dL (ref 8.9–10.3)
Chloride: 104 mmol/L (ref 98–111)
Creatinine: 0.99 mg/dL (ref 0.44–1.00)
GFR, Estimated: >60 mL/min (ref >60)
Glucose, Bld: 118 mg/dL — ABNORMAL HIGH (ref 70–99)
Potassium: 3.4 mmol/L — ABNORMAL LOW (ref 3.5–5.1)
Sodium: 139 mmol/L (ref 135–145)
Total Bilirubin: 0.3 mg/dL (ref 0.0–1.2)
Total Protein: 7.3 g/dL (ref 6.5–8.1)

## 2024-10-07 LAB — TOTAL PROTEIN, URINE DIPSTICK: Protein, ur: 300 mg/dL — AB

## 2024-10-07 MED ORDER — SODIUM CHLORIDE 0.9 % IV SOLN
500.0000 mg/m2 | Freq: Once | INTRAVENOUS | Status: AC
  Administered 2024-10-07: 700 mg via INTRAVENOUS
  Filled 2024-10-07: qty 20

## 2024-10-07 MED ORDER — CYANOCOBALAMIN 1000 MCG/ML IJ SOLN
1000.0000 ug | Freq: Once | INTRAMUSCULAR | Status: AC
  Administered 2024-10-07: 1000 ug via INTRAMUSCULAR
  Filled 2024-10-07: qty 1

## 2024-10-07 MED ORDER — DEXAMETHASONE SODIUM PHOSPHATE 10 MG/ML IJ SOLN
10.0000 mg | Freq: Once | INTRAMUSCULAR | Status: AC
  Administered 2024-10-07: 10 mg via INTRAVENOUS

## 2024-10-07 MED ORDER — SODIUM CHLORIDE 0.9 % IV SOLN: Freq: Once | INTRAVENOUS | Status: AC

## 2024-10-07 MED ORDER — PROCHLORPERAZINE MALEATE 10 MG PO TABS
10.0000 mg | ORAL_TABLET | Freq: Once | ORAL | Status: AC
  Administered 2024-10-07: 10 mg via ORAL
  Filled 2024-10-07: qty 1

## 2024-10-07 NOTE — Telephone Encounter (Signed)
   Patient Name: Kirsten Williams  DOB: 05-24-1971 MRN: [id number]  Primary Cardiologist: None  Chart reviewed as part of pre-operative protocol coverage. Given past medical history and time since last visit, based on ACC/AHA guidelines, [name] is at acceptable risk for the planned procedure without further cardiovascular testing.   Preop cardiovascular exam     No significant cardiac symptoms at this time. Evaluation will cardiac workup with echocardiogram and cardiac CT were unremarkable.   From cardiac standpoint okay to proceed with her dental surgeries and upcoming procedures and surgeries for her lung, she remains at low risk for perioperative cardiac complications.   No need for daily aspirin  at this time.      The patient was advised that if she develops new symptoms prior to surgery to contact our office to arrange for a follow-up visit, and she verbalized understanding.  I will route this recommendation to the requesting party via Epic fax function and remove from pre-op pool.  Please call with questions.  Lamarr Satterfield, NP 10/07/2024, 8:53 AM

## 2024-10-07 NOTE — Telephone Encounter (Signed)
   Pre-operative Risk Assessment    Patient Name: Kirsten Williams  DOB: Oct 19, 1971 MRN: [id number]   Date of last office visit: 10/03/24 Date of next office visit: N/A  Request for Surgical Clearance    Procedure:  Dental Extraction - Amount of Teeth to be Pulled:  1 and 2 dental implants  Date of Surgery:  Clearance TBD                                Surgeon:  Dr. Lamar Jaeger Surgeon's Group or Practice Name:  [facility] Phone number:  [phone] Fax number:  [phone]   Type of Clearance Requested:   - Medical    Type of Anesthesia:  IV sedation   Additional requests/questions:    SignedAnnabella LITTIE Sayres   10/07/2024, 8:43 AM

## 2024-10-07 NOTE — Patient Instructions (Signed)
 [facility] - A DEPT OF [facility]  Discharge Instructions: Thank you for choosing [facility] to provide your oncology and hematology care.   If you have a lab appointment with the Cancer Center, please go directly to the Cancer Center and check in at the registration area.   Wear comfortable clothing and clothing appropriate for easy access to any Portacath or PICC line.   We strive to give you quality time with your provider. You may need to reschedule your appointment if you arrive late (15 or more minutes).  Arriving late affects you and other patients whose appointments are after yours.  Also, if you miss three or more appointments without notifying the office, you may be dismissed from the clinic at the provider's discretion.      For prescription refill requests, have your pharmacy contact our office and allow 72 hours for refills to be completed.    Today you received the following chemotherapy and/or immunotherapy agents Alimta      To help prevent nausea and vomiting after your treatment, we encourage you to take your nausea medication as directed.  BELOW ARE SYMPTOMS THAT SHOULD BE REPORTED IMMEDIATELY: *FEVER GREATER THAN 100.4 F (38 C) OR HIGHER *CHILLS OR SWEATING *NAUSEA AND VOMITING THAT IS NOT CONTROLLED WITH YOUR NAUSEA MEDICATION *UNUSUAL SHORTNESS OF BREATH *UNUSUAL BRUISING OR BLEEDING *URINARY PROBLEMS (pain or burning when urinating, or frequent urination) *BOWEL PROBLEMS (unusual diarrhea, constipation, pain near the anus) TENDERNESS IN MOUTH AND THROAT WITH OR WITHOUT PRESENCE OF ULCERS (sore throat, sores in mouth, or a toothache) UNUSUAL RASH, SWELLING OR PAIN  UNUSUAL VAGINAL DISCHARGE OR ITCHING   Items with * indicate a potential emergency and should be followed up as soon as possible or go to the Emergency Department if any problems should occur.  Please show the CHEMOTHERAPY ALERT CARD or IMMUNOTHERAPY  ALERT CARD at check-in to the Emergency Department and triage nurse.  Should you have questions after your visit or need to cancel or reschedule your appointment, please contact [facility] - A DEPT OF [facility]  Dept: [phone]  and follow the prompts.  Office hours are 8:00 a.m. to 4:30 p.m. Monday - Friday. Please note that voicemails left after 4:00 p.m. may not be returned until the following business day.  We are closed weekends and major holidays. You have access to a nurse at all times for urgent questions. Please call the main number to the clinic Dept: [phone] and follow the prompts.   For any non-urgent questions, you may also contact your provider using MyChart. We now offer e-Visits for anyone 79 and older to request care online for non-urgent symptoms. For details visit [email or web address].   Also download the MyChart app! Go to the app store, search "MyChart", open the app, select [place], and log in with your MyChart username and password.

## 2024-10-09 ENCOUNTER — Encounter: Payer: Self-pay | Admitting: Emergency Medicine

## 2024-10-09 ENCOUNTER — Ambulatory Visit: Admitting: Emergency Medicine

## 2024-10-09 VITALS — BP 137/82 | HR 76 | Temp 97.6°F | Ht 61.0 in | Wt 94.6 lb

## 2024-10-09 DIAGNOSIS — F17200 Nicotine dependence, unspecified, uncomplicated: Secondary | ICD-10-CM

## 2024-10-09 DIAGNOSIS — Z87891 Personal history of nicotine dependence: Secondary | ICD-10-CM | POA: Diagnosis not present

## 2024-10-09 DIAGNOSIS — C3491 Malignant neoplasm of unspecified part of right bronchus or lung: Secondary | ICD-10-CM

## 2024-10-09 NOTE — Progress Notes (Signed)
 Subjective:    Patient ID: Kirsten Williams, female    DOB: 08-04-1971, 53 y.o.   MRN: [id number]  HPI 53 year old woman, former smoker (48 pack years) whom I have seen remotely (last 07/2021).  She has a history of SLE on immunosuppression.  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  There were apparently no targetable mutations on molecular studies.  Her pulmonary function testing at the time did not show any evidence for overt obstruction.  She has been followed closely by medical oncology and is currently on systemic chemotherapy.   CT scan of the chest, abdomen, pelvis done 07/27/2024 reviewed by me shows stable enlarged mediastinal nodes, dominant subsolid spiculated nodule in superior segment of the right lower lobe 2.4 x 2.0 cm that has enlarged compared with most recent priors, additional subsolid ground glass nodules in the peripheral left upper lobe 12 mm, 17 mm.  Her subsolid right upper lobe pulmonary nodule with an adjacent fiducial marker is unchanged at 14 mm.  No evidence of metastatic disease in the abdomen or pelvis.   Review of Systems As per HPI  Past Medical History:  Diagnosis Date   Adenocarcinoma of right lung (HCC) 02/15/2022   Adenocarcinoma of right lung, stage 4 (HCC) 06/14/2021   Adenocarcinoma, lung, right (HCC) 05/03/2021   Kirsten Williams is a 53 y.o. female with a history of lung nodules dating back to 2019 who is referred in consultation with Rankin Dike, PA-C for assessment and management. She is a former smoker having quit in 2016. 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 She missed her screening CT last year and imaging was ordered last month. 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   Hypercalcemia 08/23/2022   Hypertension, essential, benign    Hypokalemia 04/22/2024   INSOMNIA NOS 02/06/2007   Qualifier: Diagnosis of   By: Damien Folks         MIGRAINE, UNSPEC., W/O INTRACTABLE MIGRAINE 02/06/2007   Qualifier: Diagnosis of   By: Damien Folks      Replacing diagnoses that were inactivated after the 03/11/23 regulatory import     Osteoporosis 03/01/2022   Palpitations    Pleuritic chest pain 10/24/2022   Post-op pain 05/04/2021   Rheumatoid arthritis (HCC) 02/15/2022   Skin infection 11/13/2023   SLE (systemic lupus erythematosus) (HCC) 05/03/2021   SLE (systemic lupus erythematosus) (HCC) 05/03/2021   TOBACCO DEPENDENCE 02/06/2007   Qualifier: Diagnosis of   By: Damien Folks           Family History  Problem Relation Age of Onset   Stroke Mother        4 strokes   Heart Problems Mother    Stroke Father    Cancer Father    Neuropathy Father    Hypertension Sister      Social History   Socioeconomic History   Marital status: Married    Spouse name: Not on file   Number of children: Not  on file   Years of education: Not on file   Highest education level: Not on file  Occupational History   Not on file  Tobacco Use   Smoking status: Former    Current packs/day: 0.00    Average packs/day: 1.5 packs/day for 32.0 years (48.0 ttl pk-yrs)    Types: Cigarettes    Start date: 12/10/1982    Quit date: 12/10/2014    Years since quitting: 9.8   Smokeless tobacco: Never  Vaping Use   Vaping status: Never Used  Substance and Sexual Activity   Alcohol use: Yes    Alcohol/week: 3.0 standard drinks of alcohol    Types: 3 Shots of liquor per week    Comment: daily   Drug use: Never   Sexual activity: Yes  Other Topics Concern    Not on file  Social History Narrative   Right Handed    Lives in a two story home. Lives with husband.   Social Drivers of Corporate Investment Banker Strain: Not on file  Food Insecurity: No Food Insecurity (08/05/2024)   Hunger Vital Sign    Worried About Running Out of Food in the Last Year: Never true    Ran Out of Food in the Last Year: Never true  Transportation Needs: No Transportation Needs (08/05/2024)   PRAPARE - Administrator, Civil Service (Medical): No    Lack of Transportation (Non-Medical): No  Physical Activity: Not on file  Stress: Not on file  Social Connections: Not on file  Intimate Partner Violence: Not At Risk (08/05/2024)   Humiliation, Afraid, Rape, and Kick questionnaire    Fear of Current or Ex-Partner: No    Emotionally Abused: No    Physically Abused: No    Sexually Abused: No     Allergies  Allergen Reactions   Clindamycin/Lincomycin Itching and Nausea And Vomiting   Losartan Itching    Throat itching and burning   Carboplatin  Itching and Other (See Comments)    Erythema to hands/feet/arms/perineal area  Elevated BP      Outpatient Medications Prior to Visit  Medication Sig Dispense Refill   Acetaminophen  (TYLENOL ) 325 MG CAPS Take 325 mg by mouth daily as needed (pain).     amLODipine (NORVASC) 10 MG tablet Take 10 mg by mouth daily.     Calcium  Carbonate-Vit D-Min (CALCIUM  1200 PO) Take 1 capsule by mouth daily.     carvedilol  (COREG ) 6.25 MG tablet Take 1 tablet (6.25 mg total) by mouth 2 (two) times daily. 180 tablet 3   CLOBETASOL PROPIONATE E 0.05 % emollient cream Apply topically.     cyclobenzaprine (FLEXERIL) 10 MG tablet Take 10 mg by mouth at bedtime as needed for muscle spasms.     denosumab  (PROLIA ) 60 MG/ML SOSY injection Inject 60 mg into the skin every 6 (six) months.     dicyclomine (BENTYL) 10 MG capsule Take 10 mg by mouth every 6 (six) hours as needed for spasms.     diphenhydrAMINE  (BENADRYL ) 25 MG tablet  Take 25 mg by mouth daily as needed for allergies.     doxycycline (VIBRA-TABS) 100 MG tablet Take 100 mg by mouth daily.     famotidine  (PEPCID ) 40 MG tablet Take 40 mg by mouth daily.     fluticasone (FLONASE) 50 MCG/ACT nasal spray Place 2 sprays into both nostrils daily.     folic acid  (FOLVITE ) 1 MG tablet TAKE 1 TABLET(1 MG) BY MOUTH DAILY 30 tablet 4  furosemide (LASIX) 20 MG tablet Take 20 mg by mouth daily.     gabapentin (NEURONTIN) 300 MG capsule Take 300 mg by mouth at bedtime.     hydrocortisone  1 % lotion Apply 1 application topically 2 (two) times daily. 118 mL 0   hydroxychloroquine (PLAQUENIL) 200 MG tablet Take 200 mg by mouth 2 (two) times daily.     Multiple Vitamins-Minerals (MULTI ADULT GUMMIES) CHEW Chew 2 tablets by mouth daily.     omeprazole (PRILOSEC) 40 MG capsule Take 40 mg by mouth daily as needed (acid reflux).     oxyCODONE -acetaminophen  (PERCOCET/ROXICET) 5-325 MG tablet Take 1 tablet by mouth every 8 (eight) hours as needed for severe pain (pain score 7-10). 30 tablet 0   potassium chloride  SA (KLOR-CON  M) 20 MEQ tablet Take 1 tablet (20 mEq total) by mouth daily. 8 tablet 0   Probiotic Product (PROBIOTIC-10 PO) Take 1 tablet by mouth daily.     prochlorperazine  (COMPAZINE ) 10 MG tablet TAKE 1 TABLET(10 MG) BY MOUTH EVERY 6 HOURS AS NEEDED 30 tablet 2   rizatriptan (MAXALT-MLT) 10 MG disintegrating tablet Take 10 mg by mouth 2 (two) times daily as needed for migraine.     rosuvastatin  (CRESTOR ) 5 MG tablet Take 1 tablet (5 mg total) by mouth 3 (three) times a week. 48 tablet 3   tetrahydrozoline 0.05 % ophthalmic solution Place 1 drop into both eyes once. Systane Eye Drops once daily both eyes     triamcinolone ointment (KENALOG) 0.5 % Apply 1 Application topically 3 (three) times daily.     No facility-administered medications prior to visit.         Objective:   Physical Exam Vitals:   10/09/24 0825  BP: 137/82  Pulse: 76  Temp: 97.6 F (36.4 C)   SpO2: 100%  Weight: 94 lb 9.6 oz (42.9 kg)  Height: 5' 1 (1.549 m)   Gen: Pleasant, well-nourished, in no distress,  normal affect  ENT: No lesions,  mouth clear,  oropharynx clear, no postnasal drip  Neck: No JVD, no stridor  Lungs: No use of accessory muscles, no crackles or wheezing on normal respiration, no wheeze on forced expiration  Cardiovascular: RRR, heart sounds normal, no murmur or gallops, no peripheral edema  Musculoskeletal: No deformities, no cyanosis or clubbing  Neuro: alert, awake, non focal  Skin: Warm, no lesions or rash      Assessment & Plan:   Adenocarcinoma of right lung, stage 4 (HCC)  We reviewed her CT scan of the chest, abdomen, pelvis and also her treatment regimen.  She is been on therapy for 3 years.  There is an enlarging mixed density nodule in the superior segment of the right lower lobe.  The other nodules appear to all be stable in size on her current treatment.  I think she would be a good candidate for pulsed electric field ablation.  We talked in detail about this and I explained the procedure, risks, benefits, rationale of performing.  She is going to think about this.  In the meantime we will work on seeing how we can coordinate the timing for PEF treatment if she elects to proceed.  We will repeat a super D CT chest if she decides she wants to have the procedure.   We reviewed your CT scan of the chest, abdomen, pelvis from August We talked today about possible bronchoscopy and pulsed electric field (PEF) treatment to 1 or more of your pulmonary nodules.  This would be  done with the [facility]ya PEF System produced by [facility].  Please think about the pros and cons of this procedure so we can decide whether you believe it is right for you. If we do decide to perform the procedure then we will repeat your CT scan of the chest so we have up-to-date images. Dr. Shelah will look into possible timing and give you some feedback about when we might be  able to proceed Follow Dr. Shelah in 1 month, sooner if you have any problems.  TOBACCO DEPENDENCE She no longer smokes.  She denies any significant dyspnea or respiratory symptoms.  Her pulmonary function testing from back in 2022 was reassuring.  We can plan to repeat, consider BD therapy if respiratory symptoms evolve.  I personally spent a total of 68 minutes in the care of the patient today including preparing to see the patient, getting/reviewing separately obtained history, performing a medically appropriate exam/evaluation, counseling and educating, referring and communicating with other health care professionals, documenting clinical information in the EHR, independently interpreting results, and communicating results.   Lamar Shelah, MD, PhD 10/09/2024, 12:51 PM [facility] [phone] or if no answer before 7:00PM call [phone] For any issues after 7:00PM please call eLink [phone]

## 2024-10-09 NOTE — Patient Instructions (Addendum)
 It is good to see you today We reviewed your CT scan of the chest, abdomen, pelvis from August We talked today about possible bronchoscopy and pulsed electric field (PEF) treatment to 1 or more of your pulmonary nodules.  This would be done with the [facility]ya PEF System produced by [facility].  Please think about the pros and cons of this procedure so we can decide whether you believe it is right for you. If we do decide to perform the procedure then we will repeat your CT scan of the chest so we have up-to-date images. Dr. Shelah will look into possible timing and give you some feedback about when we might be able to proceed Follow Dr. Shelah in 1 month, sooner if you have any problems.

## 2024-10-09 NOTE — Assessment & Plan Note (Signed)
  We reviewed her CT scan of the chest, abdomen, pelvis and also her treatment regimen.  She is been on therapy for 3 years.  There is an enlarging mixed density nodule in the superior segment of the right lower lobe.  The other nodules appear to all be stable in size on her current treatment.  I think she would be a good candidate for pulsed electric field ablation.  We talked in detail about this and I explained the procedure, risks, benefits, rationale of performing.  She is going to think about this.  In the meantime we will work on seeing how we can coordinate the timing for PEF treatment if she elects to proceed.  We will repeat a super D CT chest if she decides she wants to have the procedure.   We reviewed your CT scan of the chest, abdomen, pelvis from August We talked today about possible bronchoscopy and pulsed electric field (PEF) treatment to 1 or more of your pulmonary nodules.  This would be done with the [facility]ya PEF System produced by [facility].  Please think about the pros and cons of this procedure so we can decide whether you believe it is right for you. If we do decide to perform the procedure then we will repeat your CT scan of the chest so we have up-to-date images. Dr. Shelah will look into possible timing and give you some feedback about when we might be able to proceed Follow Dr. Shelah in 1 month, sooner if you have any problems.

## 2024-10-09 NOTE — Assessment & Plan Note (Signed)
 She no longer smokes.  She denies any significant dyspnea or respiratory symptoms.  Her pulmonary function testing from back in 2022 was reassuring.  We can plan to repeat, consider BD therapy if respiratory symptoms evolve.

## 2024-10-12 ENCOUNTER — Telehealth: Payer: Self-pay | Admitting: Emergency Medicine

## 2024-10-12 NOTE — Telephone Encounter (Signed)
 I spoke with the patient, explained to her that the clinical team from [facility] would be available to help facilitate Kirsten Williams therapy.  I did ask her if she wants to proceed.  She believes that she will want to do so but would like to wait until after the holidays.  I have encouraged her to consider doing so as soon as feasible to avoid disease progression.  We have an office visit on November 26 and we will revisit then.  FYI to Dr Sherrod

## 2024-10-28 ENCOUNTER — Encounter: Payer: Self-pay | Admitting: Internal Medicine

## 2024-10-28 ENCOUNTER — Other Ambulatory Visit: Payer: Self-pay | Admitting: Physician Assistant

## 2024-10-28 ENCOUNTER — Inpatient Hospital Stay: Attending: Hematology and Oncology

## 2024-10-28 ENCOUNTER — Inpatient Hospital Stay: Admitting: Internal Medicine

## 2024-10-28 ENCOUNTER — Inpatient Hospital Stay

## 2024-10-28 VITALS — BP 139/69 | HR 85 | Temp 96.8°F | Resp 17 | Ht 61.0 in | Wt 96.4 lb

## 2024-10-28 DIAGNOSIS — C3490 Malignant neoplasm of unspecified part of unspecified bronchus or lung: Secondary | ICD-10-CM | POA: Diagnosis not present

## 2024-10-28 DIAGNOSIS — C3431 Malignant neoplasm of lower lobe, right bronchus or lung: Secondary | ICD-10-CM | POA: Diagnosis not present

## 2024-10-28 DIAGNOSIS — E876 Hypokalemia: Secondary | ICD-10-CM

## 2024-10-28 DIAGNOSIS — Z5111 Encounter for antineoplastic chemotherapy: Secondary | ICD-10-CM | POA: Diagnosis present

## 2024-10-28 DIAGNOSIS — C3491 Malignant neoplasm of unspecified part of right bronchus or lung: Secondary | ICD-10-CM

## 2024-10-28 DIAGNOSIS — C3411 Malignant neoplasm of upper lobe, right bronchus or lung: Secondary | ICD-10-CM | POA: Insufficient documentation

## 2024-10-28 DIAGNOSIS — M810 Age-related osteoporosis without current pathological fracture: Secondary | ICD-10-CM | POA: Insufficient documentation

## 2024-10-28 DIAGNOSIS — I10 Essential (primary) hypertension: Secondary | ICD-10-CM | POA: Insufficient documentation

## 2024-10-28 DIAGNOSIS — M329 Systemic lupus erythematosus, unspecified: Secondary | ICD-10-CM | POA: Diagnosis not present

## 2024-10-28 LAB — CBC WITH DIFFERENTIAL (CANCER CENTER ONLY)
Abs Immature Granulocytes: 0.03 K/uL (ref 0.00–0.07)
Basophils Absolute: 0.1 K/uL (ref 0.0–0.1)
Basophils Relative: 1 %
Eosinophils Absolute: 0.1 K/uL (ref 0.0–0.5)
Eosinophils Relative: 2 %
HCT: 35 % — ABNORMAL LOW (ref 36.0–46.0)
Hemoglobin: 11 g/dL — ABNORMAL LOW (ref 12.0–15.0)
Immature Granulocytes: 0 %
Lymphocytes Relative: 25 %
Lymphs Abs: 2 K/uL (ref 0.7–4.0)
MCH: 31.9 pg (ref 26.0–34.0)
MCHC: 31.4 g/dL (ref 30.0–36.0)
MCV: 101.4 fL — ABNORMAL HIGH (ref 80.0–100.0)
Monocytes Absolute: 0.9 K/uL (ref 0.1–1.0)
Monocytes Relative: 11 %
Neutro Abs: 4.8 K/uL (ref 1.7–7.7)
Neutrophils Relative %: 61 %
Platelet Count: 319 K/uL (ref 150–400)
RBC: 3.45 MIL/uL — ABNORMAL LOW (ref 3.87–5.11)
RDW: 17 % — ABNORMAL HIGH (ref 11.5–15.5)
WBC Count: 7.9 K/uL (ref 4.0–10.5)
nRBC: 0 % (ref 0.0–0.2)

## 2024-10-28 LAB — CMP (CANCER CENTER ONLY)
ALT: 12 U/L (ref 0–44)
AST: 29 U/L (ref 15–41)
Albumin: 3.7 g/dL (ref 3.5–5.0)
Alkaline Phosphatase: 112 U/L (ref 38–126)
Anion gap: 15 (ref 5–15)
BUN: 7 mg/dL (ref 6–20)
CO2: 25 mmol/L (ref 22–32)
Calcium: 9.4 mg/dL (ref 8.9–10.3)
Chloride: 100 mmol/L (ref 98–111)
Creatinine: 1.01 mg/dL — ABNORMAL HIGH (ref 0.44–1.00)
GFR, Estimated: >60 mL/min (ref >60)
Glucose, Bld: 119 mg/dL — ABNORMAL HIGH (ref 70–99)
Potassium: 3 mmol/L — ABNORMAL LOW (ref 3.5–5.1)
Sodium: 140 mmol/L (ref 135–145)
Total Bilirubin: 0.2 mg/dL (ref 0.0–1.2)
Total Protein: 6.9 g/dL (ref 6.5–8.1)

## 2024-10-28 LAB — TOTAL PROTEIN, URINE DIPSTICK: Protein, ur: >300 mg/dL — AB

## 2024-10-28 MED ORDER — SODIUM CHLORIDE 0.9 % IV SOLN
500.0000 mg/m2 | Freq: Once | INTRAVENOUS | Status: AC
  Administered 2024-10-28: 700 mg via INTRAVENOUS
  Filled 2024-10-28: qty 28

## 2024-10-28 MED ORDER — POTASSIUM CHLORIDE CRYS ER 20 MEQ PO TBCR: 20.0000 meq | EXTENDED_RELEASE_TABLET | Freq: Two times a day (BID) | ORAL | 0 refills | Status: AC

## 2024-10-28 MED ORDER — SODIUM CHLORIDE 0.9 % IV SOLN: Freq: Once | INTRAVENOUS | Status: AC

## 2024-10-28 MED ORDER — DEXAMETHASONE SODIUM PHOSPHATE 10 MG/ML IJ SOLN
10.0000 mg | Freq: Once | INTRAMUSCULAR | Status: AC
  Administered 2024-10-28: 10 mg via INTRAVENOUS

## 2024-10-28 MED ORDER — PROCHLORPERAZINE MALEATE 10 MG PO TABS
10.0000 mg | ORAL_TABLET | Freq: Once | ORAL | Status: AC
  Administered 2024-10-28: 10 mg via ORAL
  Filled 2024-10-28: qty 1

## 2024-10-28 NOTE — Progress Notes (Signed)
 [facility] [phone]   [phone]  OFFICE PROGRESS NOTE  Kirsten Lot, PA-C [address] [place] [address]  DIAGNOSIS:  Stage IV (T4, N2, M1 a) non-small cell lung cancer, adenocarcinoma presented with multifocal disease involving the right upper lobe, right lower lobe as well as suspicious lower paratracheal lymphadenopathy and groundglass nodules in the left lung diagnosed in May 2022. The patient had molecular studies performed by [facility] [employer] and that showed no detectable mutation but this is likely secondary to low-level circulating free tumor DNA.  PRIOR THERAPY: None.  CURRENT THERAPY: palliative systemic chemotherapy with carboplatin  for AUC of 5, Alimta  500 Mg/M2 and Avastin  15 Mg/KG every 3 weeks.  The patient is not a great candidate for immunotherapy because of her history of active systemic lupus erythematosus.  Starting from cycle #7 the patient is on maintenance treatment with Alimta  and Avastin  every 3 weeks.  She is status post 56 cycles.  INTERVAL HISTORY: Kirsten Williams 53 y.o. female returns to the clinic today for follow-up visit. Discussed the use of AI scribe software for clinical note transcription with the patient, who gave verbal consent to proceed.  History of Present Illness Kirsten Williams is a 53 year old female with stage four non-small cell lung cancer who presents for evaluation before starting cycle fifty-seven of her chemotherapy regimen.  Diagnosed with stage four non-small cell lung cancer, adenocarcinoma, in May 2022. Initially treated with palliative systemic chemotherapy using carboplatin , Alimta , and Avastin  every three weeks for six cycles. Currently on maintenance treatment with Alimta  and Avastin  every three weeks, having completed fifty-six cycles.  She feels good overall and has gained weight. Her blood pressure was initially high but normalized after calming down.  Underwent a dental  procedure on November 3rd, which is healing well despite some puffiness. Scheduled for a follow-up on December 1st.  Her last scan was in August. She plans to cook for Thanksgiving as usual, indicating her ability to maintain her daily activities.    MEDICAL HISTORY: Past Medical History:  Diagnosis Date   Adenocarcinoma of right lung (HCC) 02/15/2022   Adenocarcinoma of right lung, stage 4 (HCC) 06/14/2021   Adenocarcinoma, lung, right (HCC) 05/03/2021   Kirsten Williams is a 53 y.o. female with a history of lung nodules dating back to 2019 who is referred in consultation with Williams Montey, PA-C for assessment and management. She is a former smoker having quit in 2016. 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 She missed her screening CT last year and imaging was ordered last month. 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   Hypercalcemia 08/23/2022   Hypertension, essential, benign    Hypokalemia 04/22/2024   INSOMNIA NOS 02/06/2007   Qualifier: Diagnosis of   By: Kivett, Whitney         MIGRAINE, UNSPEC., W/O INTRACTABLE MIGRAINE 02/06/2007   Qualifier: Diagnosis of   By: Damien Folks      Replacing diagnoses that were inactivated after the 03/11/23 regulatory  import     Osteoporosis 03/01/2022   Palpitations    Pleuritic chest pain 10/24/2022   Post-op pain 05/04/2021   Rheumatoid arthritis (HCC) 02/15/2022   Skin infection 11/13/2023   SLE (systemic lupus erythematosus) (HCC) 05/03/2021   SLE (systemic lupus erythematosus) (HCC) 05/03/2021    TOBACCO DEPENDENCE 02/06/2007   Qualifier: Diagnosis of   By: Kivett, Whitney          ALLERGIES:  is allergic to clindamycin/lincomycin, losartan, and carboplatin .  MEDICATIONS:  Current Outpatient Medications  Medication Sig Dispense Refill   Acetaminophen  (TYLENOL ) 325 MG CAPS Take 325 mg by mouth daily as needed (pain).     amLODipine (NORVASC) 10 MG tablet Take 10 mg by mouth daily.     Calcium  Carbonate-Vit D-Min (CALCIUM  1200 PO) Take 1 capsule by mouth daily.     carvedilol  (COREG ) 6.25 MG tablet Take 1 tablet (6.25 mg total) by mouth 2 (two) times daily. 180 tablet 3   CLOBETASOL PROPIONATE E 0.05 % emollient cream Apply topically.     cyclobenzaprine (FLEXERIL) 10 MG tablet Take 10 mg by mouth at bedtime as needed for muscle spasms.     denosumab  (PROLIA ) 60 MG/ML SOSY injection Inject 60 mg into the skin every 6 (six) months.     dicyclomine (BENTYL) 10 MG capsule Take 10 mg by mouth every 6 (six) hours as needed for spasms.     diphenhydrAMINE  (BENADRYL ) 25 MG tablet Take 25 mg by mouth daily as needed for allergies.     doxycycline (VIBRA-TABS) 100 MG tablet Take 100 mg by mouth daily.     famotidine  (PEPCID ) 40 MG tablet Take 40 mg by mouth daily.     fluticasone (FLONASE) 50 MCG/ACT nasal spray Place 2 sprays into both nostrils daily.     folic acid  (FOLVITE ) 1 MG tablet TAKE 1 TABLET(1 MG) BY MOUTH DAILY 30 tablet 4   furosemide (LASIX) 20 MG tablet Take 20 mg by mouth daily.     gabapentin (NEURONTIN) 300 MG capsule Take 300 mg by mouth at bedtime.     hydrocortisone  1 % lotion Apply 1 application topically 2 (two) times daily. 118 mL 0   hydroxychloroquine (PLAQUENIL) 200 MG tablet Take 200 mg by mouth 2 (two) times daily.     Multiple Vitamins-Minerals (MULTI ADULT GUMMIES) CHEW Chew 2 tablets by mouth daily.     omeprazole (PRILOSEC) 40 MG capsule Take 40 mg by mouth daily as needed (acid reflux).     oxyCODONE -acetaminophen  (PERCOCET/ROXICET) 5-325 MG tablet Take 1  tablet by mouth every 8 (eight) hours as needed for severe pain (pain score 7-10). 30 tablet 0   potassium chloride  SA (KLOR-CON  M) 20 MEQ tablet Take 1 tablet (20 mEq total) by mouth daily. 8 tablet 0   Probiotic Product (PROBIOTIC-10 PO) Take 1 tablet by mouth daily.     prochlorperazine  (COMPAZINE ) 10 MG tablet TAKE 1 TABLET(10 MG) BY MOUTH EVERY 6 HOURS AS NEEDED 30 tablet 2   rizatriptan (MAXALT-MLT) 10 MG disintegrating tablet Take 10 mg by mouth 2 (two) times daily as needed for migraine.     rosuvastatin  (CRESTOR ) 5 MG tablet Take 1 tablet (5 mg total) by mouth 3 (three) times a week. 48 tablet 3   tetrahydrozoline 0.05 % ophthalmic solution Place 1 drop into both eyes once. Systane Eye Drops once daily both eyes     triamcinolone ointment (KENALOG) 0.5 % Apply 1 Application topically 3 (three) times daily.     No  current facility-administered medications for this visit.    SURGICAL HISTORY:  Past Surgical History:  Procedure Laterality Date   ABDOMINAL HYSTERECTOMY     BRONCHIAL BIOPSY  04/24/2021   Procedure: BRONCHIAL BIOPSIES;  Surgeon: Shelah Lamar RAMAN, MD;  Location: [facility] ENDOSCOPY;  Service: Pulmonary;;   BRONCHIAL BRUSHINGS  04/24/2021   Procedure: BRONCHIAL BRUSHINGS;  Surgeon: Shelah Lamar RAMAN, MD;  Location: [facility] ENDOSCOPY;  Service: Pulmonary;;   BRONCHIAL NEEDLE ASPIRATION BIOPSY  04/24/2021   Procedure: BRONCHIAL NEEDLE ASPIRATION BIOPSIES;  Surgeon: Shelah Lamar RAMAN, MD;  Location: [facility] ENDOSCOPY;  Service: Pulmonary;;   BRONCHIAL WASHINGS  04/24/2021   Procedure: BRONCHIAL WASHINGS;  Surgeon: Shelah Lamar RAMAN, MD;  Location: [facility] ENDOSCOPY;  Service: Pulmonary;;   CESAREAN SECTION  11/09/1990   DILATION AND CURETTAGE OF UTERUS Bilateral 09/09/1996   FIDUCIAL MARKER PLACEMENT  04/24/2021   Procedure: FIDUCIAL MARKER PLACEMENT;  Surgeon: Shelah Lamar RAMAN, MD;  Location: [facility] ENDOSCOPY;  Service: Pulmonary;;   TONSILLECTOMY  12/10/1977   VIDEO BRONCHOSCOPY WITH ENDOBRONCHIAL NAVIGATION  Bilateral 04/24/2021   Procedure: VIDEO BRONCHOSCOPY WITH ENDOBRONCHIAL NAVIGATION;  Surgeon: Shelah Lamar RAMAN, MD;  Location: [facility] ENDOSCOPY;  Service: Pulmonary;  Laterality: Bilateral;    REVIEW OF SYSTEMS:  A comprehensive review of systems was negative except for: Constitutional: positive for fatigue   PHYSICAL EXAMINATION: General appearance: alert, cooperative, and no distress Head: Normocephalic, without obvious abnormality, atraumatic Neck: no adenopathy, no JVD, supple, symmetrical, trachea midline, and thyroid  not enlarged, symmetric, no tenderness/mass/nodules Lymph nodes: Cervical, supraclavicular, and axillary nodes normal. Resp: clear to auscultation bilaterally Back: symmetric, no curvature. ROM normal. No CVA tenderness. Cardio: regular rate and rhythm, S1, S2 normal, no murmur, click, rub or gallop GI: soft, non-tender; bowel sounds normal; no masses,  no organomegaly Extremities: extremities normal, atraumatic, no cyanosis or edema  ECOG PERFORMANCE STATUS: 1 - Symptomatic but completely ambulatory  Blood pressure 139/69, pulse 85, temperature (!) 96.8 F (36 C), temperature source Temporal, resp. rate 17, height 5' 1 (1.549 m), weight 96 lb 6.4 oz (43.7 kg), SpO2 100%.  LABORATORY DATA: Lab Results  Component Value Date   WBC 7.9 10/28/2024   HGB 11.0 (L) 10/28/2024   HCT 35.0 (L) 10/28/2024   MCV 101.4 (H) 10/28/2024   PLT 319 10/28/2024      Chemistry      Component Value Date/Time   NA 139 10/07/2024 0925   NA 142 07/01/2024 0902   K 3.4 (L) 10/07/2024 0925   CL 104 10/07/2024 0925   CO2 25 10/07/2024 0925   BUN 8 10/07/2024 0925   BUN 29 (H) 07/01/2024 0902   CREATININE 0.99 10/07/2024 0925   GLU 107 03/13/2021 0000      Component Value Date/Time   CALCIUM  9.1 10/07/2024 0925   ALKPHOS 94 10/07/2024 0925   AST 16 10/07/2024 0925   ALT 7 10/07/2024 0925   BILITOT 0.3 10/07/2024 0925       RADIOGRAPHIC STUDIES: No results  found.    ASSESSMENT AND PLAN:  This is a very pleasant 53 years old white female recently diagnosed with a stage IV non-small cell lung cancer, adenocarcinoma with no actionable mutation diagnosed in May 2022.  The patient has also history of systemic lupus erythematosus and she is not a candidate for immunotherapy. She is currently undergoing systemic chemotherapy with carboplatin  for AUC of 5, Alimta  500 Mg/M2 and Avastin  15 Mg/KG every 3 weeks status post 56 cycles.  Starting from cycle #7 the patient is  on maintenance treatment with Alimta  and Avastin  every 3 weeks.   She has been tolerating this treatment fairly well. Assessment and Plan Assessment & Plan Stage IV non-small cell lung adenocarcinoma on active systemic therapy Stage IV non-small cell lung adenocarcinoma, diagnosed in May 2022. Currently on maintenance therapy with Alimta  and Avastin  every three weeks. Completed 56 cycles of treatment. Considering pulse electric field electroporation to stimulate the immune system and shrink tumor areas. Avastin  is held due to elevated protein levels, but Alimta  can be administered. Dental issues are being managed, and a follow-up is scheduled for December 1st. A scan is planned to assess disease status, with coordination with Dr. Byrum for timing. - Continue Alimta  therapy. - Held Avastin  due to elevated protein levels. - Will schedule a scan in two weeks, with the option for Dr. Ruther to move it sooner if needed for the procedure. - Will coordinate with Dr. Shelah regarding the timing of the scan.  Essential hypertension Blood pressure was slightly elevated initially but improved after calming down. Blood pressure is generally well-controlled. - Continue current management and monitor blood pressure. The patient was advised to call immediately if she has any other concerning symptoms in the interval.  The patient voices understanding of current disease status and treatment options and is in  agreement with the current care plan.  All questions were answered. The patient knows to call the clinic with any problems, questions or concerns. We can certainly see the patient much sooner if necessary. The total time spent in the appointment was 30 minutes.  Disclaimer: This note was dictated with voice recognition software. Similar sounding words can inadvertently be transcribed and may not be corrected upon review.

## 2024-10-28 NOTE — Progress Notes (Signed)
 Per Dr. Sherrod holding Avastin  today due to proteinuria. Removed from treatment plan for today.   Alfonso MARLA Buys, PharmD Pharmacy Resident  10/28/2024 10:48 AM

## 2024-10-28 NOTE — Patient Instructions (Signed)
 [facility] - A DEPT OF [facility]  Discharge Instructions: Thank you for choosing [facility] to provide your oncology and hematology care.   If you have a lab appointment with the Cancer Center, please go directly to the Cancer Center and check in at the registration area.   Wear comfortable clothing and clothing appropriate for easy access to any Portacath or PICC line.   We strive to give you quality time with your provider. You may need to reschedule your appointment if you arrive late (15 or more minutes).  Arriving late affects you and other patients whose appointments are after yours.  Also, if you miss three or more appointments without notifying the office, you may be dismissed from the clinic at the provider's discretion.      For prescription refill requests, have your pharmacy contact our office and allow 72 hours for refills to be completed.    Today you received the following chemotherapy and/or immunotherapy agents Alimta      To help prevent nausea and vomiting after your treatment, we encourage you to take your nausea medication as directed.  BELOW ARE SYMPTOMS THAT SHOULD BE REPORTED IMMEDIATELY: *FEVER GREATER THAN 100.4 F (38 C) OR HIGHER *CHILLS OR SWEATING *NAUSEA AND VOMITING THAT IS NOT CONTROLLED WITH YOUR NAUSEA MEDICATION *UNUSUAL SHORTNESS OF BREATH *UNUSUAL BRUISING OR BLEEDING *URINARY PROBLEMS (pain or burning when urinating, or frequent urination) *BOWEL PROBLEMS (unusual diarrhea, constipation, pain near the anus) TENDERNESS IN MOUTH AND THROAT WITH OR WITHOUT PRESENCE OF ULCERS (sore throat, sores in mouth, or a toothache) UNUSUAL RASH, SWELLING OR PAIN  UNUSUAL VAGINAL DISCHARGE OR ITCHING   Items with * indicate a potential emergency and should be followed up as soon as possible or go to the Emergency Department if any problems should occur.  Please show the CHEMOTHERAPY ALERT CARD or IMMUNOTHERAPY  ALERT CARD at check-in to the Emergency Department and triage nurse.  Should you have questions after your visit or need to cancel or reschedule your appointment, please contact [facility] - A DEPT OF [facility]  Dept: [phone]  and follow the prompts.  Office hours are 8:00 a.m. to 4:30 p.m. Monday - Friday. Please note that voicemails left after 4:00 p.m. may not be returned until the following business day.  We are closed weekends and major holidays. You have access to a nurse at all times for urgent questions. Please call the main number to the clinic Dept: [phone] and follow the prompts.   For any non-urgent questions, you may also contact your provider using MyChart. We now offer e-Visits for anyone 79 and older to request care online for non-urgent symptoms. For details visit [email or web address].   Also download the MyChart app! Go to the app store, search "MyChart", open the app, select [place], and log in with your MyChart username and password.

## 2024-11-04 ENCOUNTER — Telehealth: Payer: Self-pay

## 2024-11-04 ENCOUNTER — Encounter: Payer: Self-pay | Admitting: Emergency Medicine

## 2024-11-04 ENCOUNTER — Telehealth: Payer: Self-pay | Admitting: Emergency Medicine

## 2024-11-04 ENCOUNTER — Other Ambulatory Visit: Payer: Self-pay | Admitting: Physician Assistant

## 2024-11-04 ENCOUNTER — Ambulatory Visit: Admitting: Emergency Medicine

## 2024-11-04 VITALS — BP 130/78 | HR 75 | Ht 61.0 in | Wt 93.2 lb

## 2024-11-04 DIAGNOSIS — Z87891 Personal history of nicotine dependence: Secondary | ICD-10-CM

## 2024-11-04 DIAGNOSIS — C3491 Malignant neoplasm of unspecified part of right bronchus or lung: Secondary | ICD-10-CM

## 2024-11-04 DIAGNOSIS — R918 Other nonspecific abnormal finding of lung field: Secondary | ICD-10-CM

## 2024-11-04 NOTE — Assessment & Plan Note (Signed)
 Discussed the pros and cons of PEF treatment, risk benefits of the bronchoscopy and procedure itself.  She is still considering this but is leaning towards doing it.  She wants to wait until early January.  They had a lot of questions about the rationale, about whether she would continue to need chemotherapy, about whether we would attempt to do PEF on multiple sites.  All questions answered.  I will work on try to get this set up for 12/15/2024.  She will let me know if she decides to defer the procedure.  We discussed your pulmonary nodules on CT chest. We will work on modifying your CT scan that is planned for next week to include appropriate imaging of your chest to prepare for procedure. We we will plan for bronchoscopy with PEF treatment of your pulmonary nodules.  We will try to get this set up for early January 2026.  You will need a designated driver and someone to watch you at home that day after the procedure.  We will try to schedule this for Tuesday 12/15/2024. We will arrange follow-up in our office after your bronchoscopy

## 2024-11-04 NOTE — Telephone Encounter (Signed)
 Pt had ov 11/26 and ordered CT Super D Chest. Pt is already scheduled for imaging 13/3 at [facility]. Can this also be scheduled same day and added together to get them done at same time? Order has been placed.

## 2024-11-04 NOTE — Telephone Encounter (Signed)
 I have got this scheduled I will call the patient with the info and do the letter

## 2024-11-04 NOTE — Telephone Encounter (Signed)
 Please schedule the following:  Provider performing procedure: Anatole Apollo Diagnosis: Bilateral pulmonary nodules, adenocarcinoma Which side if for nodule / mass?  Bilateral Procedure: Robotic assisted navigational bronchoscopy, pulsed electric field ablation (PEF) Has patient been spoken to by Provider and given informed consent?  Yes Anesthesia: General Do you need Fluro?  Yes  Duration of procedure: 120 minutes Date: 12/15/2024 Alternate Date: 12/14/2024 Time: Any, last case preferred Location: [facility] Does patient have OSA?  No DM?  No or Latex allergy?  No Medication Restriction/ Anticoagulate/Antiplatelet: None Pre-op Labs Ordered:determined by Anesthesia Imaging request: We will work on adding a super D CT to her existing CT chest to be done on 12/3 (If, SuperDimension CT Chest, please have STAT courier sent to ENDO)

## 2024-11-04 NOTE — Telephone Encounter (Signed)
 The ct has been added

## 2024-11-04 NOTE — Patient Instructions (Addendum)
 We discussed your pulmonary nodules on CT chest. We will work on modifying your CT scan that is planned for next week to include appropriate imaging of your chest to prepare for procedure. We we will plan for bronchoscopy with PEF treatment of your pulmonary nodules.  We will try to get this set up for early January 2026.  You will need a designated driver and someone to watch you at home that day after the procedure.  We will try to schedule this for Tuesday 12/15/2024. We will arrange follow-up in our office after your bronchoscopy

## 2024-11-04 NOTE — Progress Notes (Signed)
 Subjective:    Patient ID: Kirsten Williams, female    DOB: 01/24/1971, 53 y.o.   MRN: [id number]  HPI 53 year old woman, former smoker (48 pack years) whom I have seen remotely (last 07/2021).  She has a history of SLE on immunosuppression.  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  There were apparently no targetable mutations on molecular studies.  Her pulmonary function testing at the time did not show any evidence for overt obstruction.  She has been followed closely by medical oncology and is currently on systemic chemotherapy.   CT scan of the chest, abdomen, pelvis done 07/27/2024 reviewed by me shows stable enlarged mediastinal nodes, dominant subsolid spiculated nodule in superior segment of the right lower lobe 2.4 x 2.0 cm that has enlarged compared with most recent priors, additional subsolid ground glass nodules in the peripheral left upper lobe 12 mm, 17 mm.  Her subsolid right upper lobe pulmonary nodule with an adjacent fiducial marker is unchanged at 14 mm.  No evidence of metastatic disease in the abdomen or pelvis.  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  No significant obstruction on pulmonary function testing.  She has been on systemic chemotherapy.  I saw her in early November after her CT scan showed enlarging spiculated nodule in the superior segment of the right lower lobe, scattered other ground glass pulmonary nodules.  We talked about repeat bronchoscopy and possible PEF with Kirsten Williams.  She is open to this but wanted to think about the timing.  She is back today to discuss further.   Review of Systems As per HPI  Past Medical History:  Diagnosis Date   Adenocarcinoma of right lung (HCC) 02/15/2022   Adenocarcinoma of right lung, stage 4 (HCC) 06/14/2021    Adenocarcinoma, lung, right (HCC) 05/03/2021   Kirsten Williams is a 53 y.o. female with a history of lung nodules dating back to 2019 who is referred in consultation with Rankin Dike, PA-C for assessment and management. She is a former smoker having quit in 2016. 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 She missed her screening CT last year and imaging was ordered last month. 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   Hypercalcemia 08/23/2022   Hypertension, essential, benign    Hypokalemia 04/22/2024   INSOMNIA NOS 02/06/2007   Qualifier: Diagnosis of   By: Damien Folks         MIGRAINE, UNSPEC., W/O INTRACTABLE MIGRAINE 02/06/2007   Qualifier: Diagnosis of   By: Damien Folks      Replacing diagnoses that were inactivated after the 03/11/23 regulatory import     Osteoporosis 03/01/2022   Palpitations    Pleuritic chest pain 10/24/2022   Post-op pain 05/04/2021   Rheumatoid arthritis (HCC) 02/15/2022   Skin infection 11/13/2023   SLE (systemic lupus erythematosus) (HCC) 05/03/2021   SLE (systemic lupus  erythematosus) (HCC) 05/03/2021   TOBACCO DEPENDENCE 02/06/2007   Qualifier: Diagnosis of   By: Damien Folks           Family History  Problem Relation Age of Onset   Stroke Mother        4 strokes   Heart Problems Mother    Stroke Father    Cancer Father    Neuropathy Father    Hypertension Sister      Social History   Socioeconomic History   Marital status: Married    Spouse name: Not on file   Number of children: Not on file   Years  of education: Not on file   Highest education level: Not on file  Occupational History   Not on file  Tobacco Use   Smoking status: Former    Current packs/day: 0.00    Average packs/day: 1.5 packs/day for 32.0 years (48.0 ttl pk-yrs)    Types: Cigarettes    Start date: 12/10/1982    Quit date: 12/10/2014    Years since quitting: 9.9   Smokeless tobacco: Never  Vaping Use   Vaping status: Never Used  Substance and Sexual Activity   Alcohol use: Yes    Alcohol/week: 3.0 standard drinks of alcohol    Types: 3 Shots of liquor per week    Comment: daily   Drug use: Never   Sexual activity: Yes  Other Topics Concern   Not on file  Social History Narrative   Right Handed    Lives in a two story home. Lives with husband.   Social Drivers of Corporate Investment Banker Strain: Not on file  Food Insecurity: No Food Insecurity (08/05/2024)   Hunger Vital Sign    Worried About Running Out of Food in the Last Year: Never true    Ran Out of Food in the Last Year: Never true  Transportation Needs: No Transportation Needs (08/05/2024)   PRAPARE - Administrator, Civil Service (Medical): No    Lack of Transportation (Non-Medical): No  Physical Activity: Not on file  Stress: Not on file  Social Connections: Not on file  Intimate Partner Violence: Not At Risk (08/05/2024)   Humiliation, Afraid, Rape, and Kick questionnaire    Fear of Current or Ex-Partner: No    Emotionally Abused: No    Physically Abused: No    Sexually Abused: No     Allergies  Allergen Reactions   Clindamycin/Lincomycin Itching and Nausea And Vomiting   Losartan Itching    Throat itching and burning   Carboplatin  Itching and Other (See Comments)    Erythema to hands/feet/arms/perineal area  Elevated BP      Outpatient Medications Prior to Visit  Medication Sig Dispense Refill   Acetaminophen  (TYLENOL ) 325 MG CAPS Take 325 mg by mouth daily as needed (pain).     amLODipine (NORVASC) 10 MG tablet  Take 10 mg by mouth daily.     Calcium  Carbonate-Vit D-Min (CALCIUM  1200 PO) Take 1 capsule by mouth daily.     carvedilol  (COREG ) 6.25 MG tablet Take 1 tablet (6.25 mg total) by mouth 2 (two) times daily. 180 tablet 3   CLOBETASOL PROPIONATE E 0.05 % emollient cream Apply topically.     cyclobenzaprine (FLEXERIL) 10 MG tablet Take 10 mg by mouth at bedtime as needed for muscle spasms.     denosumab  (PROLIA ) 60 MG/ML SOSY injection Inject 60 mg into the skin every 6 (six) months.     dicyclomine (  BENTYL) 10 MG capsule Take 10 mg by mouth every 6 (six) hours as needed for spasms.     diphenhydrAMINE  (BENADRYL ) 25 MG tablet Take 25 mg by mouth daily as needed for allergies.     famotidine  (PEPCID ) 40 MG tablet Take 40 mg by mouth daily.     fluticasone (FLONASE) 50 MCG/ACT nasal spray Place 2 sprays into both nostrils daily.     folic acid  (FOLVITE ) 1 MG tablet TAKE 1 TABLET(1 MG) BY MOUTH DAILY 30 tablet 4   furosemide (LASIX) 20 MG tablet Take 20 mg by mouth daily.     gabapentin (NEURONTIN) 300 MG capsule Take 300 mg by mouth at bedtime.     hydrocortisone  1 % lotion Apply 1 application topically 2 (two) times daily. 118 mL 0   hydroxychloroquine (PLAQUENIL) 200 MG tablet Take 200 mg by mouth 2 (two) times daily.     Multiple Vitamins-Minerals (MULTI ADULT GUMMIES) CHEW Chew 2 tablets by mouth daily.     omeprazole (PRILOSEC) 40 MG capsule Take 40 mg by mouth daily as needed (acid reflux).     oxyCODONE -acetaminophen  (PERCOCET/ROXICET) 5-325 MG tablet Take 1 tablet by mouth every 8 (eight) hours as needed for severe pain (pain score 7-10). 30 tablet 0   potassium chloride  SA (KLOR-CON  M) 20 MEQ tablet Take 1 tablet (20 mEq total) by mouth 2 (two) times daily. 16 tablet 0   Probiotic Product (PROBIOTIC-10 PO) Take 1 tablet by mouth daily.     prochlorperazine  (COMPAZINE ) 10 MG tablet TAKE 1 TABLET(10 MG) BY MOUTH EVERY 6 HOURS AS NEEDED 30 tablet 2   rizatriptan (MAXALT-MLT) 10 MG disintegrating  tablet Take 10 mg by mouth 2 (two) times daily as needed for migraine.     rosuvastatin  (CRESTOR ) 5 MG tablet Take 1 tablet (5 mg total) by mouth 3 (three) times a week. 48 tablet 3   tetrahydrozoline 0.05 % ophthalmic solution Place 1 drop into both eyes once. Systane Eye Drops once daily both eyes     triamcinolone ointment (KENALOG) 0.5 % Apply 1 Application topically 3 (three) times daily.     doxycycline (VIBRA-TABS) 100 MG tablet Take 100 mg by mouth daily. (Patient not taking: Reported on 11/04/2024)     No facility-administered medications prior to visit.         Objective:   Physical Exam Vitals:   11/04/24 1037  BP: 130/78  Pulse: 75  SpO2: 100%  Weight: 93 lb 3.2 oz (42.3 kg)  Height: 5' 1 (1.549 m)   Gen: Pleasant, well-nourished, in no distress,  normal affect  ENT: No lesions,  mouth clear,  oropharynx clear, no postnasal drip  Neck: No JVD, no stridor  Lungs: No use of accessory muscles, no crackles or wheezing on normal respiration, no wheeze on forced expiration  Cardiovascular: RRR, heart sounds normal, no murmur or gallops, no peripheral edema  Musculoskeletal: No deformities, no cyanosis or clubbing  Neuro: alert, awake, non focal  Skin: Warm, no lesions or rash      Assessment & Plan:   Adenocarcinoma of right lung, stage 4 (HCC) Discussed the pros and cons of PEF treatment, risk benefits of the bronchoscopy and procedure itself.  She is still considering this but is leaning towards doing it.  She wants to wait until early January.  They had a lot of questions about the rationale, about whether she would continue to need chemotherapy, about whether we would attempt to do PEF on multiple sites.  All  questions answered.  I will work on try to get this set up for 12/15/2024.  She will let me know if she decides to defer the procedure.  We discussed your pulmonary nodules on CT chest. We will work on modifying your CT scan that is planned for next week to  include appropriate imaging of your chest to prepare for procedure. We we will plan for bronchoscopy with PEF treatment of your pulmonary nodules.  We will try to get this set up for early January 2026.  You will need a designated driver and someone to watch you at home that day after the procedure.  We will try to schedule this for Tuesday 12/15/2024. We will arrange follow-up in our office after your bronchoscopy   I personally spent a total of 33 minutes in the care of the patient today including preparing to see the patient, getting/reviewing separately obtained history, performing a medically appropriate exam/evaluation, counseling and educating, referring and communicating with other health care professionals, documenting clinical information in the EHR, independently interpreting results, and communicating results.   Lamar Chris, MD, PhD 11/04/2024, 11:20 AM [facility] [phone] or if no answer before 7:00PM call [phone] For any issues after 7:00PM please call eLink [phone]

## 2024-11-05 ENCOUNTER — Other Ambulatory Visit: Payer: Self-pay

## 2024-11-11 ENCOUNTER — Inpatient Hospital Stay (INDEPENDENT_AMBULATORY_CARE_PROVIDER_SITE_OTHER): Admission: RE | Admit: 2024-11-11 | Discharge: 2024-11-11 | Attending: Internal Medicine | Admitting: Internal Medicine

## 2024-11-11 DIAGNOSIS — R918 Other nonspecific abnormal finding of lung field: Secondary | ICD-10-CM | POA: Diagnosis not present

## 2024-11-11 DIAGNOSIS — C3490 Malignant neoplasm of unspecified part of unspecified bronchus or lung: Secondary | ICD-10-CM | POA: Diagnosis not present

## 2024-11-11 MED ORDER — IOHEXOL 300 MG/ML  SOLN
100.0000 mL | Freq: Once | INTRAMUSCULAR | Status: AC | PRN
  Administered 2024-11-11: 100 mL via INTRAVENOUS

## 2024-11-12 ENCOUNTER — Ambulatory Visit

## 2024-11-12 ENCOUNTER — Other Ambulatory Visit: Payer: Self-pay

## 2024-11-12 ENCOUNTER — Encounter (HOSPITAL_BASED_OUTPATIENT_CLINIC_OR_DEPARTMENT_OTHER): Payer: Self-pay

## 2024-11-12 NOTE — Progress Notes (Unsigned)
 [facility] OFFICE PROGRESS NOTE  [name], PA-C [address] [place] [address]  DIAGNOSIS: Stage IV (T4, N2, M1 a) non-small cell lung cancer, adenocarcinoma presented with multifocal disease involving the right upper lobe, right lower lobe as well as suspicious lower paratracheal lymphadenopathy and groundglass nodules in the left lung diagnosed in May 2022. The patient had molecular studies performed by [facility] and that showed no detectable mutation but this is likely secondary to low-level circulating free tumor DNA    PRIOR THERAPY: None  CURRENT THERAPY: Palliative systemic chemotherapy with carboplatin  for AUC of 5, Alimta  500 Mg/M2 and Avastin  15 Mg/KG every 3 weeks.  The patient is not a great candidate for immunotherapy in the event that she has a genetic mutation.  She is status post 57 cycles. Starting from cycle #6, the patient will start maintenance Alimta  and Avastin .   INTERVAL HISTORY: CHANDRA ASHER 53 y.o. female returns to the clinic today for a follow up visit accompanied by her sister. The patient was last see in the clinic by Dr. Sherrod on 10/07/24.   She saw Dr. Shelah who arranged for PEF which is scheduled for 12/16/23.   She is feeling well.    She is experiencing elevated urine protein levels. She also recently saw a new nephrologist.    Her appetite has improved ***significantly. She gained weight.    Denies any fever, chills, or night sweats.  She reports her breathing is ***.  She denies any unusual cough.  She denies any abnormal bleeding or bruising.  She denies any nausea, vomiting, diarrhea, or constipation.  Denies any headache or visual changes.  She had a CT super D.    She is here today for evaluation and repeat blood work before undergoing cycle #56.   MEDICAL HISTORY: Past Medical History:  Diagnosis Date   Adenocarcinoma of right lung (HCC) 02/15/2022   Adenocarcinoma of right lung, stage 4 (HCC) 06/14/2021    Adenocarcinoma, lung, right (HCC) 05/03/2021   PERLINE AWE is a 53 y.o. female with a history of lung nodules dating back to 2019 who is referred in consultation with Lot Montey, PA-C for assessment and management. She is a former smoker having quit in 2016. 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 She missed her screening CT last year and imaging was ordered last month. 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   Hypercalcemia 08/23/2022   Hypertension, essential, benign    Hypokalemia 04/22/2024   INSOMNIA NOS 02/06/2007   Qualifier: Diagnosis of   By: Kivett, Whitney         MIGRAINE, UNSPEC., W/O INTRACTABLE MIGRAINE 02/06/2007   Qualifier: Diagnosis of   By: Damien Folks      Replacing diagnoses that were inactivated after the 03/11/23 regulatory import     Osteoporosis 03/01/2022   Palpitations    Pleuritic chest pain 10/24/2022   Post-op pain 05/04/2021   Rheumatoid arthritis (HCC) 02/15/2022   Skin infection 11/13/2023   SLE (systemic lupus erythematosus) (HCC)  05/03/2021   SLE (systemic lupus erythematosus) (HCC) 05/03/2021   TOBACCO DEPENDENCE 02/06/2007   Qualifier: Diagnosis of   By: Kivett, Whitney          ALLERGIES:  is allergic to clindamycin/lincomycin, losartan, and carboplatin .  MEDICATIONS:  Current Outpatient Medications  Medication Sig Dispense Refill   Acetaminophen  (TYLENOL ) 325 MG CAPS Take 325 mg by mouth daily as needed (pain).     amLODipine (NORVASC) 10 MG tablet Take 10 mg by mouth daily.     Calcium  Carbonate-Vit  D-Min (CALCIUM  1200 PO) Take 1 capsule by mouth daily.     carvedilol  (COREG ) 6.25 MG tablet Take 1 tablet (6.25 mg total) by mouth 2 (two) times daily. 180 tablet 3   CLOBETASOL PROPIONATE E 0.05 % emollient cream Apply topically.     cyclobenzaprine (FLEXERIL) 10 MG tablet Take 10 mg by mouth at bedtime as needed for muscle spasms.     denosumab  (PROLIA ) 60 MG/ML SOSY injection Inject 60 mg into the skin every 6 (six) months.     dicyclomine (BENTYL) 10 MG capsule Take 10 mg by mouth every 6 (six) hours as needed for spasms.     diphenhydrAMINE  (BENADRYL ) 25 MG tablet Take 25 mg by mouth daily as needed for allergies.     doxycycline (VIBRA-TABS) 100 MG tablet Take 100 mg by mouth daily. (Patient not taking: Reported on 11/04/2024)     famotidine  (PEPCID ) 40 MG tablet Take 40 mg by mouth daily.     fluticasone (FLONASE) 50 MCG/ACT nasal spray Place 2 sprays into both nostrils daily.     folic acid  (FOLVITE ) 1 MG tablet TAKE 1 TABLET(1 MG) BY MOUTH DAILY 30 tablet 4   furosemide (LASIX) 20 MG tablet Take 20 mg by mouth daily.     gabapentin (NEURONTIN) 300 MG capsule Take 300 mg by mouth at bedtime.     hydrocortisone  1 % lotion Apply 1 application topically 2 (two) times daily. 118 mL 0   hydroxychloroquine (PLAQUENIL) 200 MG tablet Take 200 mg by mouth 2 (two) times daily.     Multiple Vitamins-Minerals (MULTI ADULT GUMMIES) CHEW Chew 2 tablets by mouth daily.     omeprazole (PRILOSEC) 40 MG capsule Take 40 mg by mouth daily as needed (acid reflux).     oxyCODONE -acetaminophen  (PERCOCET/ROXICET) 5-325 MG tablet Take 1 tablet by mouth every 8 (eight) hours as needed for severe pain (pain score 7-10). 30 tablet 0   potassium chloride  SA (KLOR-CON  M) 20 MEQ tablet Take 1 tablet (20 mEq total) by mouth 2 (two) times daily. 16 tablet 0   Probiotic Product (PROBIOTIC-10 PO) Take 1 tablet by mouth daily.     prochlorperazine  (COMPAZINE ) 10 MG tablet TAKE 1 TABLET(10 MG) BY MOUTH EVERY 6 HOURS AS  NEEDED 30 tablet 2   rizatriptan (MAXALT-MLT) 10 MG disintegrating tablet Take 10 mg by mouth 2 (two) times daily as needed for migraine.     rosuvastatin  (CRESTOR ) 5 MG tablet Take 1 tablet (5 mg total) by mouth 3 (three) times a week. 48 tablet 3   tetrahydrozoline 0.05 % ophthalmic solution Place 1 drop into both eyes once. Systane Eye Drops once daily both eyes     triamcinolone ointment (KENALOG) 0.5 % Apply 1 Application topically 3 (three) times daily.     No current facility-administered medications for this visit.    SURGICAL HISTORY:  Past Surgical History:  Procedure Laterality Date   ABDOMINAL HYSTERECTOMY     BRONCHIAL BIOPSY  04/24/2021  Procedure: BRONCHIAL BIOPSIES;  Surgeon: Shelah Lamar RAMAN, MD;  Location: [facility] ENDOSCOPY;  Service: Pulmonary;;   BRONCHIAL BRUSHINGS  04/24/2021   Procedure: BRONCHIAL BRUSHINGS;  Surgeon: Shelah Lamar RAMAN, MD;  Location: [facility] ENDOSCOPY;  Service: Pulmonary;;   BRONCHIAL NEEDLE ASPIRATION BIOPSY  04/24/2021   Procedure: BRONCHIAL NEEDLE ASPIRATION BIOPSIES;  Surgeon: Shelah Lamar RAMAN, MD;  Location: [facility] ENDOSCOPY;  Service: Pulmonary;;   BRONCHIAL WASHINGS  04/24/2021   Procedure: BRONCHIAL WASHINGS;  Surgeon: Shelah Lamar RAMAN, MD;  Location: [facility] ENDOSCOPY;  Service: Pulmonary;;   CESAREAN SECTION  11/09/1990   DILATION AND CURETTAGE OF UTERUS Bilateral 09/09/1996   FIDUCIAL MARKER PLACEMENT  04/24/2021   Procedure: FIDUCIAL MARKER PLACEMENT;  Surgeon: Shelah Lamar RAMAN, MD;  Location: [facility] ENDOSCOPY;  Service: Pulmonary;;   TONSILLECTOMY  12/10/1977   VIDEO BRONCHOSCOPY WITH ENDOBRONCHIAL NAVIGATION Bilateral 04/24/2021   Procedure: VIDEO BRONCHOSCOPY WITH ENDOBRONCHIAL NAVIGATION;  Surgeon: Shelah Lamar RAMAN, MD;  Location: [facility] ENDOSCOPY;  Service: Pulmonary;  Laterality: Bilateral;    REVIEW OF SYSTEMS:   Review of Systems  Constitutional: Negative for appetite change, chills, fatigue, fever and unexpected weight change.  HENT:   Negative for mouth  sores, nosebleeds, sore throat and trouble swallowing.   Eyes: Negative for eye problems and icterus.  Respiratory: Negative for cough, hemoptysis, shortness of breath and wheezing.   Cardiovascular: Negative for chest pain and leg swelling.  Gastrointestinal: Negative for abdominal pain, constipation, diarrhea, nausea and vomiting.  Genitourinary: Negative for bladder incontinence, difficulty urinating, dysuria, frequency and hematuria.   Musculoskeletal: Negative for back pain, gait problem, neck pain and neck stiffness.  Skin: Negative for itching and rash.  Neurological: Negative for dizziness, extremity weakness, gait problem, headaches, light-headedness and seizures.  Hematological: Negative for adenopathy. Does not bruise/bleed easily.  Psychiatric/Behavioral: Negative for confusion, depression and sleep disturbance. The patient is not nervous/anxious.     PHYSICAL EXAMINATION:  There were no vitals taken for this visit.  ECOG PERFORMANCE STATUS: {CHL ONC ECOG [id number]  Physical Exam  Constitutional: Oriented to person, place, and time and well-developed, well-nourished, and in no distress. No distress.  HENT:  Head: Normocephalic and atraumatic.  Mouth/Throat: Oropharynx is clear and moist. No oropharyngeal exudate.  Eyes: Conjunctivae are normal. Right eye exhibits no discharge. Left eye exhibits no discharge. No scleral icterus.  Neck: Normal range of motion. Neck supple.  Cardiovascular: Normal rate, regular rhythm, normal heart sounds and intact distal pulses.   Pulmonary/Chest: Effort normal and breath sounds normal. No respiratory distress. No wheezes. No rales.  Abdominal: Soft. Bowel sounds are normal. Exhibits no distension and no mass. There is no tenderness.  Musculoskeletal: Normal range of motion. Exhibits no edema.  Lymphadenopathy:    No cervical adenopathy.  Neurological: Alert and oriented to person, place, and time. Exhibits normal muscle tone. Gait  normal. Coordination normal.  Skin: Skin is warm and dry. No rash noted. Not diaphoretic. No erythema. No pallor.  Psychiatric: Mood, memory and judgment normal.  Vitals reviewed.  LABORATORY DATA: Lab Results  Component Value Date   WBC 7.9 10/28/2024   HGB 11.0 (L) 10/28/2024   HCT 35.0 (L) 10/28/2024   MCV 101.4 (H) 10/28/2024   PLT 319 10/28/2024      Chemistry      Component Value Date/Time   NA 140 10/28/2024 0950   NA 142 07/01/2024 0902   K 3.0 (L) 10/28/2024 0950   CL 100 10/28/2024 0950   CO2 25 10/28/2024 0950   BUN 7  10/28/2024 0950   BUN 29 (H) 07/01/2024 0902   CREATININE 1.01 (H) 10/28/2024 0950   GLU 107 03/13/2021 0000      Component Value Date/Time   CALCIUM  9.4 10/28/2024 0950   ALKPHOS 112 10/28/2024 0950   AST 29 10/28/2024 0950   ALT 12 10/28/2024 0950   BILITOT 0.2 10/28/2024 0950       RADIOGRAPHIC STUDIES:  No results found.   ASSESSMENT/PLAN:  This is a very pleasant 53 year old Caucasian female diagnosed with stage IV (T4, N2, M1 a) non-small cell lung cancer, adenocarcinoma presented with multifocal disease involving the right upper lobe, right lower lobe as well as suspicious lower paratracheal lymphadenopathy and groundglass nodules in the left lung diagnosed in May 2022. The patient had molecular studies performed by [facility] and that showed no detectable mutation but this is likely secondary to low-level circulating free tumor DNA. If the patient has disease progression in the future, then we will likely retest her for molecular studies.    The patient is currently undergoing systemic chemotherapy with carboplatin  for an AUC 5, Alimta  500 mg per metered square, and and Avastin  15 mg/kg IV every 3 weeks.  She is status post 56 cycles.  Starting from cycle #7, the patient has been on maintenance treatment with Alimta  and Avastin .   Labs were reviewed. Her urine protein is ***. We will *** the avastin  today.  Recommend that she proceed  with cycle #57 today as scheduled with alimta .   She is scheduled for PEF on 12/16/23.    He has very mild ankle swelling for which she is advised to elevate and use compression stockings. She was given lasix by nephrology.    The patient was advised to call immediately if she has any concerning symptoms in the interval. The patient voices understanding of current disease status and treatment options and is in agreement with the current care plan. All questions were answered. The patient knows to call the clinic with any problems, questions or concerns. We can certainly see the patient much sooner if necessary    No orders of the defined types were placed in this encounter.    I spent {CHL ONC TIME VISIT - DTPQU:[id number]} counseling the patient face to face. The total time spent in the appointment was {CHL ONC TIME VISIT - DTPQU:[id number]}.  Anibal Quinby L Alaney Witter, PA-C 11/12/24

## 2024-11-18 ENCOUNTER — Encounter: Payer: Self-pay | Admitting: Emergency Medicine

## 2024-11-18 ENCOUNTER — Other Ambulatory Visit: Attending: Hematology and Oncology

## 2024-11-18 ENCOUNTER — Inpatient Hospital Stay

## 2024-11-18 ENCOUNTER — Ambulatory Visit: Admitting: Physician Assistant

## 2024-11-18 VITALS — HR 80 | Temp 98.6°F | Resp 19

## 2024-11-18 VITALS — BP 149/73 | HR 90 | Temp 97.7°F | Resp 16

## 2024-11-18 DIAGNOSIS — C3491 Malignant neoplasm of unspecified part of right bronchus or lung: Secondary | ICD-10-CM | POA: Diagnosis not present

## 2024-11-18 DIAGNOSIS — E876 Hypokalemia: Secondary | ICD-10-CM

## 2024-11-18 DIAGNOSIS — T50995D Adverse effect of other drugs, medicaments and biological substances, subsequent encounter: Secondary | ICD-10-CM | POA: Diagnosis not present

## 2024-11-18 DIAGNOSIS — K5900 Constipation, unspecified: Secondary | ICD-10-CM | POA: Insufficient documentation

## 2024-11-18 DIAGNOSIS — M329 Systemic lupus erythematosus, unspecified: Secondary | ICD-10-CM | POA: Insufficient documentation

## 2024-11-18 DIAGNOSIS — R809 Proteinuria, unspecified: Secondary | ICD-10-CM | POA: Insufficient documentation

## 2024-11-18 DIAGNOSIS — C3411 Malignant neoplasm of upper lobe, right bronchus or lung: Secondary | ICD-10-CM | POA: Insufficient documentation

## 2024-11-18 DIAGNOSIS — Z5111 Encounter for antineoplastic chemotherapy: Secondary | ICD-10-CM | POA: Diagnosis present

## 2024-11-18 DIAGNOSIS — M810 Age-related osteoporosis without current pathological fracture: Secondary | ICD-10-CM | POA: Diagnosis not present

## 2024-11-18 LAB — CBC WITH DIFFERENTIAL (CANCER CENTER ONLY)
Abs Immature Granulocytes: 0.04 K/uL (ref 0.00–0.07)
Basophils Absolute: 0.1 K/uL (ref 0.0–0.1)
Basophils Relative: 1 %
Eosinophils Absolute: 0.1 K/uL (ref 0.0–0.5)
Eosinophils Relative: 2 %
HCT: 31.5 % — ABNORMAL LOW (ref 36.0–46.0)
Hemoglobin: 10.5 g/dL — ABNORMAL LOW (ref 12.0–15.0)
Immature Granulocytes: 0 %
Lymphocytes Relative: 21 %
Lymphs Abs: 2 K/uL (ref 0.7–4.0)
MCH: 33 pg (ref 26.0–34.0)
MCHC: 33.3 g/dL (ref 30.0–36.0)
MCV: 99.1 fL (ref 80.0–100.0)
Monocytes Absolute: 1.3 K/uL — ABNORMAL HIGH (ref 0.1–1.0)
Monocytes Relative: 14 %
Neutro Abs: 5.8 K/uL (ref 1.7–7.7)
Neutrophils Relative %: 62 %
Platelet Count: 357 K/uL (ref 150–400)
RBC: 3.18 MIL/uL — ABNORMAL LOW (ref 3.87–5.11)
RDW: 16.9 % — ABNORMAL HIGH (ref 11.5–15.5)
WBC Count: 9.3 K/uL (ref 4.0–10.5)
nRBC: 0 % (ref 0.0–0.2)

## 2024-11-18 LAB — CMP (CANCER CENTER ONLY)
ALT: 10 U/L (ref 0–44)
AST: 29 U/L (ref 15–41)
Albumin: 3.7 g/dL (ref 3.5–5.0)
Alkaline Phosphatase: 108 U/L (ref 38–126)
Anion gap: 16 — ABNORMAL HIGH (ref 5–15)
BUN: 7 mg/dL (ref 6–20)
CO2: 25 mmol/L (ref 22–32)
Calcium: 9.6 mg/dL (ref 8.9–10.3)
Chloride: 97 mmol/L — ABNORMAL LOW (ref 98–111)
Creatinine: 1.05 mg/dL — ABNORMAL HIGH (ref 0.44–1.00)
GFR, Estimated: >60 mL/min (ref >60)
Glucose, Bld: 141 mg/dL — ABNORMAL HIGH (ref 70–99)
Potassium: 2.5 mmol/L — CL (ref 3.5–5.1)
Sodium: 137 mmol/L (ref 135–145)
Total Bilirubin: 0.3 mg/dL (ref 0.0–1.2)
Total Protein: 7.4 g/dL (ref 6.5–8.1)

## 2024-11-18 LAB — MAGNESIUM: Magnesium: 1.7 mg/dL (ref 1.7–2.4)

## 2024-11-18 LAB — TOTAL PROTEIN, URINE DIPSTICK: Protein, ur: >=300 mg/dL — AB

## 2024-11-18 MED ORDER — POTASSIUM CHLORIDE 10 MEQ/100ML IV SOLN: 10.0000 meq | INTRAVENOUS | Status: DC

## 2024-11-18 MED ORDER — DEXAMETHASONE SODIUM PHOSPHATE 10 MG/ML IJ SOLN
10.0000 mg | Freq: Once | INTRAMUSCULAR | Status: AC
  Administered 2024-11-18: 10 mg via INTRAVENOUS

## 2024-11-18 MED ORDER — POTASSIUM CHLORIDE CRYS ER 20 MEQ PO TBCR: 20.0000 meq | EXTENDED_RELEASE_TABLET | Freq: Two times a day (BID) | ORAL | 0 refills | Status: AC

## 2024-11-18 MED ORDER — POTASSIUM CHLORIDE 10 MEQ/100ML IV SOLN
10.0000 meq | INTRAVENOUS | Status: AC
  Administered 2024-11-18 (×2): 10 meq via INTRAVENOUS
  Filled 2024-11-18 (×2): qty 100

## 2024-11-18 MED ORDER — PROCHLORPERAZINE MALEATE 10 MG PO TABS
10.0000 mg | ORAL_TABLET | Freq: Once | ORAL | Status: AC
  Administered 2024-11-18: 10 mg via ORAL
  Filled 2024-11-18: qty 1

## 2024-11-18 MED ORDER — SODIUM CHLORIDE 0.9 % IV SOLN: Freq: Once | INTRAVENOUS | Status: AC

## 2024-11-18 MED ORDER — SODIUM CHLORIDE 0.9 % IV SOLN
500.0000 mg/m2 | Freq: Once | INTRAVENOUS | Status: AC
  Administered 2024-11-18: 700 mg via INTRAVENOUS
  Filled 2024-11-18: qty 20

## 2024-11-18 NOTE — Patient Instructions (Signed)
 [facility] - A DEPT OF [facility]  Discharge Instructions: Thank you for choosing [facility] to provide your oncology and hematology care.   If you have a lab appointment with the Cancer Center, please go directly to the Cancer Center and check in at the registration area.   Wear comfortable clothing and clothing appropriate for easy access to any Portacath or PICC line.   We strive to give you quality time with your provider. You may need to reschedule your appointment if you arrive late (15 or more minutes).  Arriving late affects you and other patients whose appointments are after yours.  Also, if you miss three or more appointments without notifying the office, you may be dismissed from the clinic at the providers discretion.      For prescription refill requests, have your pharmacy contact our office and allow 72 hours for refills to be completed.    Today you received the following chemotherapy and/or immunotherapy agents:   PEMEtrexed  Disodium (ALIMTA ), 20 mEq Potassium   To help prevent nausea and vomiting after your treatment, we encourage you to take your nausea medication as directed.  BELOW ARE SYMPTOMS THAT SHOULD BE REPORTED IMMEDIATELY: *FEVER GREATER THAN 100.4 F (38 C) OR HIGHER *CHILLS OR SWEATING *NAUSEA AND VOMITING THAT IS NOT CONTROLLED WITH YOUR NAUSEA MEDICATION *UNUSUAL SHORTNESS OF BREATH *UNUSUAL BRUISING OR BLEEDING *URINARY PROBLEMS (pain or burning when urinating, or frequent urination) *BOWEL PROBLEMS (unusual diarrhea, constipation, pain near the anus) TENDERNESS IN MOUTH AND THROAT WITH OR WITHOUT PRESENCE OF ULCERS (sore throat, sores in mouth, or a toothache) UNUSUAL RASH, SWELLING OR PAIN  UNUSUAL VAGINAL DISCHARGE OR ITCHING   Items with * indicate a potential emergency and should be followed up as soon as possible or go to the Emergency Department if any problems should occur.  Please show the  CHEMOTHERAPY ALERT CARD or IMMUNOTHERAPY ALERT CARD at check-in to the Emergency Department and triage nurse.  Should you have questions after your visit or need to cancel or reschedule your appointment, please contact [facility] - A DEPT OF [facility]  Dept: [phone]  and follow the prompts.  Office hours are 8:00 a.m. to 4:30 p.m. Monday - Friday. Please note that voicemails left after 4:00 p.m. may not be returned until the following business day.  We are closed weekends and major holidays. You have access to a nurse at all times for urgent questions. Please call the main number to the clinic Dept: [phone] and follow the prompts.   For any non-urgent questions, you may also contact your provider using MyChart. We now offer e-Visits for anyone 39 and older to request care online for non-urgent symptoms. For details visit [email or web address].   Also download the MyChart app! Go to the app store, search MyChart, open the app, select [facility], and log in with your MyChart username and password.

## 2024-11-18 NOTE — Progress Notes (Signed)
 CRITICAL VALUE STICKER  CRITICAL VALUE: potassium 2.5  RECEIVER (on-site recipient of call): [facility]   DATE & TIME NOTIFIED: 11/18/24 @ 1045  MESSENGER (representative from lab): Amber  MD NOTIFIED: Cassie, PA  TIME OF NOTIFICATION: 1045  RESPONSE: patient is here for treatment,  possible IV potassium and potassium sent to pharmacy to take daily.

## 2024-11-25 ENCOUNTER — Other Ambulatory Visit: Payer: Self-pay

## 2024-11-25 DIAGNOSIS — C3491 Malignant neoplasm of unspecified part of right bronchus or lung: Secondary | ICD-10-CM

## 2024-11-26 ENCOUNTER — Inpatient Hospital Stay

## 2024-11-26 ENCOUNTER — Other Ambulatory Visit: Payer: Self-pay | Admitting: Physician Assistant

## 2024-11-26 DIAGNOSIS — Z5111 Encounter for antineoplastic chemotherapy: Secondary | ICD-10-CM | POA: Diagnosis not present

## 2024-11-26 DIAGNOSIS — C3491 Malignant neoplasm of unspecified part of right bronchus or lung: Secondary | ICD-10-CM

## 2024-11-26 LAB — BASIC METABOLIC PANEL - CANCER CENTER ONLY
Anion gap: 16 — ABNORMAL HIGH (ref 5–15)
BUN: 12 mg/dL (ref 6–20)
CO2: 22 mmol/L (ref 22–32)
Calcium: 9.7 mg/dL (ref 8.9–10.3)
Chloride: 100 mmol/L (ref 98–111)
Creatinine: 1.22 mg/dL — ABNORMAL HIGH (ref 0.44–1.00)
GFR, Estimated: 52 mL/min — ABNORMAL LOW (ref >60)
Glucose, Bld: 124 mg/dL — ABNORMAL HIGH (ref 70–99)
Potassium: 3.4 mmol/L — ABNORMAL LOW (ref 3.5–5.1)
Sodium: 138 mmol/L (ref 135–145)

## 2024-11-27 NOTE — Telephone Encounter (Signed)
 Completed course

## 2024-12-01 ENCOUNTER — Ambulatory Visit: Admitting: Neurology

## 2024-12-01 ENCOUNTER — Encounter: Payer: Self-pay | Admitting: Neurology

## 2024-12-08 NOTE — Assessment & Plan Note (Signed)
 Stage IV (T4, N2, M1 a) non-small cell lung cancer, adenocarcinoma presented with multifocal disease involving the right upper lobe, right lower lobe as well as suspicious lower paratracheal lymphadenopathy and groundglass nodules in the left lung diagnosed in May 2022. The patient had molecular studies performed by [facility] and that showed no detectable mutation but this is likely secondary to low-level circulating free tumor DNA  - Scheduled for bronchoscopy in January 2026. 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  Continue with maintenance Alimta  as scheduled.

## 2024-12-08 NOTE — Progress Notes (Unsigned)
 " Patient Care Team: [name], PA-C as PCP - General (Physician Assistant) Harl Eleanor LABOR, NP as Nurse Practitioner (Hematology and Oncology) Shelah Lamar RAMAN, MD as Consulting Physician (Pulmonary Disease) Lonn Hicks, MD as Consulting Physician (Hematology and Oncology) Marquette Ozell BIRCH, DO as Referring Physician (Sports Medicine) Tobie Tonita POUR, DO as Consulting Physician (Neurology)  Clinic Day:  12/09/2024  Referring physician: [name], PA-C  ASSESSMENT & PLAN:   Assessment & Plan: Adenocarcinoma of right lung, stage 4 (HCC) Stage IV (T4, N2, M1 a) non-small cell lung cancer, adenocarcinoma presented with multifocal disease involving the right upper lobe, right lower lobe as well as suspicious lower paratracheal lymphadenopathy and groundglass nodules in the left lung diagnosed in May 2022. The patient had molecular studies performed by [facility] and that showed no detectable mutation but this is likely secondary to low-level circulating free tumor DNA  - Scheduled for bronchoscopy in January 2026. 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  Continue with maintenance Alimta  as scheduled.     Upper respiratory infection Patient with cough and congestion for past several days.  Decreased appetite.  Elevated WBC and ANC.  Rhonchi auscultated in left lower lobe lobe.  Recommended initiation of antibiotics.  She has current prescription for doxycycline, prescribed by a different provider.  Advise she start twice daily for next 7 to 10 days.  Continue with Robitussin and DayQuil as needed for symptom management.  Proteinuria Patient has continued proteinuria.  Plan to hold bevacizumab  for today's visit.  Stage IV right lung cancer Will proceed with maintenance Alimta  today.  She is currently scheduled for bronchoscopy on 12/15/2024.  She is awaiting call from urology office to postpone this treatment due to URI.  Plan to continue maintenance Alimta  as scheduled.   will  adjust treatment schedule as indicated based on bronchoscopy.  Plan Labs reviewed. -Leukocytosis, likely from URI.  Mild and stable anemia. --Treat URI with doxycycline twice daily for 7 to 10 days. -Proteinuria --Hold Avastin  from treatment today. -Mild improvement of renal functions with eGFR of 4, normal BUN of 13, serum creatinine 1.19. Proceed with maintenance Alimta . Plan for labs, follow-up, and Alimta  as currently scheduled.  The patient understands the plans discussed today and is in agreement with them.  She knows to contact our office if she develops concerns prior to her next appointment.  I provided 25 minutes of face-to-face time during this encounter and > 50% was spent counseling as documented under my assessment and plan.    Powell FORBES Lessen, NP  [facility] - A DEPT OF [facility] [address] [place] [address] Dept: [phone] Dept Fax: [phone]   No orders of the defined types were placed in this encounter.     CHIEF COMPLAINT:  CC: Adenocarcinoma of right lung  Current Treatment: Palliative systemic chemotherapy with carboplatin  for AUC of 5, Alimta  500 mg/m, and Avastin  15 mg/kg every 3 weeks.  Starting with cycle 6, patient is on maintenance Alimta  and Avastin .  INTERVAL HISTORY:  Kirsten Williams is here today for repeat clinical assessment.  She last saw Cassie, [place], on 11/18/2024.  Today, the patient presents for cycle 59 day 1 maintenance Alimta  and Avastin .  States that she has had upper respiratory tract infection for the past several days.  Congested cough with wheezing.  Has also developed diarrhea.  Has been taking Robitussin and DayQuil to help with symptoms.  She thinks she is  gradually getting better.  Currently scheduled for bronchoscopy on 1/6.  States she is waiting for pulmonology office to call her in order to postpone procedure due to URI. She denies chest pain, chest  pressure, or shortness of breath. He denies headaches or visual disturbances. He denies abdominal pain, nausea or vomiting. She denies fevers or chills.  She denies of night sweats or unintentional weight loss.  She denies pain. Her appetite has been decreased since she started having symptoms of URI. Her weight has decreased 3 pounds over last month.  I have reviewed the past medical history, past surgical history, social history and family history with the patient and they are unchanged from previous note.  ALLERGIES:  is allergic to clindamycin/lincomycin, losartan, and carboplatin .  MEDICATIONS:  Current Outpatient Medications  Medication Sig Dispense Refill   Acetaminophen  (TYLENOL ) 325 MG CAPS Take 325 mg by mouth daily as needed (pain).     amLODipine (NORVASC) 10 MG tablet Take 10 mg by mouth daily.     Calcium  Carbonate-Vit D-Min (CALCIUM  1200 PO) Take 1 capsule by mouth daily.     carvedilol  (COREG ) 6.25 MG tablet Take 1 tablet (6.25 mg total) by mouth 2 (two) times daily. 180 tablet 3   CLOBETASOL PROPIONATE E 0.05 % emollient cream Apply topically.     cyclobenzaprine (FLEXERIL) 10 MG tablet Take 10 mg by mouth at bedtime as needed for muscle spasms.     denosumab  (PROLIA ) 60 MG/ML SOSY injection Inject 60 mg into the skin every 6 (six) months.     dicyclomine (BENTYL) 10 MG capsule Take 10 mg by mouth every 6 (six) hours as needed for spasms.     diphenhydrAMINE  (BENADRYL ) 25 MG tablet Take 25 mg by mouth daily as needed for allergies.     doxycycline (VIBRA-TABS) 100 MG tablet Take 100 mg by mouth daily.     famotidine  (PEPCID ) 40 MG tablet Take 40 mg by mouth daily.     fluticasone (FLONASE) 50 MCG/ACT nasal spray Place 2 sprays into both nostrils daily.     folic acid  (FOLVITE ) 1 MG tablet TAKE 1 TABLET(1 MG) BY MOUTH DAILY 30 tablet 4   furosemide (LASIX) 20 MG tablet Take 20 mg by mouth daily.     gabapentin (NEURONTIN) 300 MG capsule Take 300 mg by mouth at bedtime.      hydrocortisone  1 % lotion Apply 1 application topically 2 (two) times daily. 118 mL 0   hydroxychloroquine (PLAQUENIL) 200 MG tablet Take 200 mg by mouth 2 (two) times daily.     Multiple Vitamins-Minerals (MULTI ADULT GUMMIES) CHEW Chew 2 tablets by mouth daily.     omeprazole (PRILOSEC) 40 MG capsule Take 40 mg by mouth daily as needed (acid reflux).     oxyCODONE -acetaminophen  (PERCOCET/ROXICET) 5-325 MG tablet Take 1 tablet by mouth every 8 (eight) hours as needed for severe pain (pain score 7-10). 30 tablet 0   potassium chloride  SA (KLOR-CON  M) 20 MEQ tablet Take 1 tablet (20 mEq total) by mouth 2 (two) times daily. 16 tablet 0   potassium chloride  SA (KLOR-CON  M) 20 MEQ tablet Take 1 tablet (20 mEq total) by mouth 2 (two) times daily. 14 tablet 0   Probiotic Product (PROBIOTIC-10 PO) Take 1 tablet by mouth daily.     prochlorperazine  (COMPAZINE ) 10 MG tablet TAKE 1 TABLET(10 MG) BY MOUTH EVERY 6 HOURS AS NEEDED 30 tablet 2   rizatriptan (MAXALT-MLT) 10 MG disintegrating tablet Take 10 mg by mouth 2 (two)  times daily as needed for migraine.     rosuvastatin  (CRESTOR ) 5 MG tablet Take 1 tablet (5 mg total) by mouth 3 (three) times a week. 48 tablet 3   tetrahydrozoline 0.05 % ophthalmic solution Place 1 drop into both eyes once. Systane Eye Drops once daily both eyes     triamcinolone ointment (KENALOG) 0.5 % Apply 1 Application topically 3 (three) times daily.     No current facility-administered medications for this visit.    HISTORY OF PRESENT ILLNESS:   Oncology History  Adenocarcinoma, lung, right (HCC)  03/06/2021 PET scan   1. Some of the ground-glass density nodules are mildly hypermetabolic including the right upper lobe sub solid nodule on image 82 of series 3 with maximum SUV of 2.5. The appearance is suspicious for multifocal low-grade adenocarcinoma. 2. Mildly hypermetabolic upper normal sized lower paratracheal lymph node with maximum SUV of 4.0 as compared to background  blood pool activity of 2.2. Malignant involvement is difficult to exclude. 3. Other imaging findings of potential clinical significance: Aortic Atherosclerosis (ICD10-I70.0) and Emphysema (ICD10-J43.9). Airway thickening is present, suggesting bronchitis or reactive airways disease. Bilateral adrenal adenomas.   04/18/2021 Imaging   CT Chest 1. Imaging for electromagnetic bronchoscopy planning shows no significant change in the multiple part solid and ground-glass pulmonary nodules compared with previous CT and PET-CT of March. Growth from older CTs and low level metabolic activity in some of these nodules remain suspicious for multifocal low-grade adenocarcinoma. 2. No new nodules or acute findings compared with recent prior imaging. 3. Mild Aortic Atherosclerosis (ICD10-I70.0). 4. Stable bilateral adrenal adenomas.   04/24/2021 Procedure   Date of Operation: 04/24/2021   Pre-op Diagnosis: Bilateral pulmonary nodules, paratracheal adenopathy   Post-op Diagnosis: Same   Surgeon: LAMAR CHRIS   Assistants: None   Anesthesia: General endotracheal anesthesia   Operation: Flexible video fiberoptic bronchoscopy with electromagnetic navigation and biopsies.   Estimated Blood Loss: Minimal   Complications: None apparent   Indications and History: Kirsten Williams is a 53 y.o. female with history of tobacco abuse.  She has a slowly enlarging mixed density pulmonary nodules bilaterally as well as a right paratracheal node.  Recommendation was made to achieve a tissue diagnosis via navigational bronchoscopy and endobronchial ultrasound.  The risks, benefits, complications, treatment options and expected outcomes were discussed with the patient.  The possibilities of pneumothorax, pneumonia, reaction to medication, pulmonary aspiration, perforation of a viscus, bleeding, failure to diagnose a condition and creating a complication requiring transfusion or operation were discussed with the patient who  freely signed the consent.     Description of Procedure: The patient was seen in the Preoperative Area, was examined and was deemed appropriate to proceed.  The patient was taken to [facility] endoscopy room 2, identified as Kirsten Williams and the procedure verified as Flexible Video Fiberoptic Bronchoscopy.  A Time Out was held and the above information confirmed.    Prior to the date of the procedure a high-resolution CT scan of the chest was performed. Utilizing superDimension software a virtual tracheobronchial tree was generated to allow the creation of distinct navigation pathways to the patient's parenchymal abnormalities. After being taken to the operating room general anesthesia was initiated and the patient  was orally intubated. The video fiberoptic bronchoscope was introduced via the endotracheal tube and a general inspection was performed which showed normal airways throughout.  There are no endobronchial lesions. The extendable working channel and locator guide were introduced into the bronchoscope. The  distinct navigation pathways prepared prior to this procedure were then utilized to navigate to within 0.3-1.0 cm of patient's lesion(s) identified on CT scan.    -Target 1 right upper lobe nodule -Target 2 right lower lobe superior segment nodule -Target 3 left upper lobe groundglass nodule   The extendable working channel was secured into place at each location and the locator guide was withdrawn. Under fluoroscopic guidance transbronchial brushings, transbronchial Wang needle biopsies, and transbronchial forceps biopsies were performed to be sent for cytology and pathology. Bronchioalveolar lavage was performed in the right lower lobe and left upper lobe (target 2, target 3) and sent for cytology.    Fiducial markers were placed adjacent to all 3 targets.  2 markers in the right upper lobe, 2 markers in the right lower lobe and 1 marker in the left upper lobe adjacent to these  abnormalities.   There was a right pretracheal lymph node at station 4 on imaging that would have been a target for transbronchial needle biopsy.  EBUS could not be performed because the largest endotracheal tube that could be used to support the patient was 8.0 and the scope would not pass.   At the end of the procedure a general airway inspection was performed and there was no evidence of active bleeding. The bronchoscope was removed.  The patient tolerated the procedure well. There was no significant blood loss and there were no obvious complications. A post-procedural chest x-ray is pending.   Samples: 1. Transbronchial brushings from right upper lobe nodule (1), right lower lobe nodule (2), left upper lobe nodule (3) 2. Transbronchial Wang needle biopsies from right upper lobe nodule (1), right lower lobe nodule (2), left upper lobe nodule (3) 3. Transbronchial forceps biopsies from right upper lobe nodule (1), right lower lobe nodule (2), left upper lobe nodule (3) 4. Bronchoalveolar lavage from right lower lobe (2), left upper lobe (3)   05/03/2021 Initial Diagnosis   Adenocarcinoma, lung, right (HCC)   05/03/2021 Cancer Staging   Staging form: Lung, AJCC 8th Edition - Clinical: Stage IVA (cT4, cN2, pM1a) - Signed by Sherrod Sherrod, MD on 06/14/2021   06/21/2021 -  Chemotherapy   Patient is on Treatment Plan : LUNG Pemetrexed  + Carboplatin  + Bevacizumab  q21d      Adenocarcinoma of right lung, stage 4 (HCC)  06/14/2021 Initial Diagnosis   Adenocarcinoma of right lung, stage 4 (HCC)   06/21/2021 -  Chemotherapy   Patient is on Treatment Plan : LUNG Pemetrexed  + Carboplatin  + Bevacizumab  q21d          REVIEW OF SYSTEMS:   Constitutional: Denies fevers, chills, night sweats, or abnormal weight loss Eyes: Denies blurriness of vision Ears, nose, mouth, throat, and face: Denies mucositis or sore throat Respiratory: She has congestion and cough, sometimes cough is productive of  phlegm. Cardiovascular: Denies palpitation, chest discomfort or lower extremity swelling Gastrointestinal:  Denies nausea or vomiting.  Has had some intermittent diarrhea since onset of URI symptoms. Skin: Denies abnormal skin rashes Lymphatics: Denies new lymphadenopathy or easy bruising Neurological:Denies numbness, tingling or new weaknesses Behavioral/Psych: Mood is stable, no new changes  All other systems were reviewed with the patient and are negative.   VITALS:   Today's Vitals   12/09/24 0900 12/09/24 0924  BP: (!) 153/88 (!) 153/82  Pulse: (!) 106 100  Resp: 18   Temp: (!) 97.4 F (36.3 C)   TempSrc: Temporal   SpO2: 99% 100%  Weight: 90 lb (40.8 kg)  Height: 5' 1 (1.549 m)   PainSc: 0-No pain    Body mass index is 17.01 kg/m.   Wt Readings from Last 3 Encounters:  12/09/24 90 lb (40.8 kg)  11/04/24 93 lb 3.2 oz (42.3 kg)  10/28/24 96 lb 6.4 oz (43.7 kg)    Body mass index is 17.01 kg/m.  Performance status (ECOG): 2 - Symptomatic, <50% confined to bed  PHYSICAL EXAM:   GENERAL:alert, no distress and comfortable SKIN: skin color, texture, turgor are normal, no rashes or significant lesions EYES: normal, Conjunctiva are pink and non-injected, sclera clear OROPHARYNX:no exudate, no erythema and lips, buccal mucosa, and tongue normal  NECK: supple, thyroid  normal size, non-tender, without nodularity LYMPH:  no palpable lymphadenopathy in the cervical, axillary or inguinal LUNGS: Rhonchi auscultated in left lower lung lobe.  Congested cough noted during OV.  Cough is nonproductive in office. HEART: regular rate & rhythm and no murmurs and no lower extremity edema ABDOMEN:abdomen soft, non-tender and normal bowel sounds Musculoskeletal:no cyanosis of digits and no clubbing  NEURO: alert & oriented x 3 with fluent speech, no focal motor/sensory deficits  LABORATORY DATA:  I have reviewed the data as listed    Component Value Date/Time   NA 138 12/09/2024  0851   NA 142 07/01/2024 0902   K 4.0 12/09/2024 0851   CL 102 12/09/2024 0851   CO2 22 12/09/2024 0851   GLUCOSE 146 (H) 12/09/2024 0851   BUN 13 12/09/2024 0851   BUN 29 (H) 07/01/2024 0902   CREATININE 1.19 (H) 12/09/2024 0851   CALCIUM  9.9 12/09/2024 0851   PROT 8.1 12/09/2024 0851   PROT 6.9 07/01/2024 0902   ALBUMIN 3.6 12/09/2024 0851   ALBUMIN 4.0 07/01/2024 0902   AST 32 12/09/2024 0851   ALT 15 12/09/2024 0851   ALKPHOS 132 (H) 12/09/2024 0851   BILITOT 0.2 12/09/2024 0851   GFRNONAA 54 (L) 12/09/2024 0851     Lab Results  Component Value Date   WBC 15.9 (H) 12/09/2024   NEUTROABS 11.7 (H) 12/09/2024   HGB 10.7 (L) 12/09/2024   HCT 33.2 (L) 12/09/2024   MCV 100.6 (H) 12/09/2024   PLT 535 (H) 12/09/2024    RADIOGRAPHIC STUDIES: CT SUPER D CHEST WO CONTRAST Result Date: 11/18/2024 CLINICAL DATA:  Non-small-cell lung cancer. Pre-procedure planning. * Tracking Code: BO * EXAM: CT CHEST WITHOUT CONTRAST TECHNIQUE: Multidetector CT imaging of the chest was performed using thin slice collimation for electromagnetic bronchoscopy planning purposes, without intravenous contrast. RADIATION DOSE REDUCTION: This exam was performed according to the departmental dose-optimization program which includes automated exposure control, adjustment of the mA and/or kV according to patient size and/or use of iterative reconstruction technique. COMPARISON:  07/27/2024 FINDINGS: Cardiovascular: The heart size is normal. Small pericardial effusion. Mild atherosclerotic calcification is noted in the wall of the thoracic aorta. Mediastinum/Nodes: 13 mm short axis right paratracheal lymph node on 37/301 was 11 mm short axis previously. 13 mm short axis precarinal node on 44/301 is similar to prior. No evidence for gross hilar lymphadenopathy although assessment is limited by the lack of intravenous contrast on the current study. The esophagus has normal imaging features. There is no axillary  lymphadenopathy. Lungs/Pleura: Multiple bilateral sub solid lesions are identified bilaterally. Anterior left upper lobe lesion measured previously at 17 mm is now 17 mm on image 22/302 posterior right upper lobe lesion measures 16 mm today compared to 14 mm previously. Peripheral left upper lobe lesion measured previously at 12 mm is on  the order of 13 mm today (28/302 although reproducible measurement is difficult due to irregular contour. The dominant right lower lobe pulmonary lesion is progressive in the interval measuring 2.6 x 2.1 cm today compared to 2.4 x 2.0 cm previously. The confluent soft tissue component of this lesion is progressive since prior (compare image 51/302 today to image 51/302 on the previous study). Additional scattered tiny pulmonary nodules again noted. No pleural effusion. Upper Abdomen: Bilateral adrenal nodules again identified, better characterized on dedicated abdomen CT performed same day and reported separately. Musculoskeletal: No worrisome lytic or sclerotic osseous abnormality. IMPRESSION: 1. Interval progression of the dominant right lower lobe pulmonary lesion with progressive confluent soft tissue component. 2. Multiple bilateral sub solid lesions are identified bilaterally, some of which are stable and some of which are minimally increased in size in the interval. 3. Mild mediastinal lymphadenopathy, similar to prior. 4. Bilateral adrenal nodules, better characterized on dedicated abdomen CT performed same day and reported separately. 5.  Aortic Atherosclerosis (ICD10-I70.0). Electronically Signed   By: Camellia Candle M.D.   On: 11/18/2024 05:51   CT CHEST ABDOMEN PELVIS W CONTRAST Result Date: 11/16/2024 EXAM: CT CHEST, ABDOMEN AND PELVIS WITH CONTRAST 11/11/2024 12:04:14 PM TECHNIQUE: CT of the chest, abdomen and pelvis was performed with the administration of 100 mL iohexol  (OMNIPAQUE ) 300 MG/ML solution. Multiplanar reformatted images are provided for review.  Automated exposure control, iterative reconstruction, and/or weight based adjustment of the mA/kV was utilized to reduce the radiation dose to as low as reasonably achievable. COMPARISON: CT 07/27/2024. CLINICAL HISTORY: Non-small cell lung cancer (NSCLC), staging. Treatment ongoing. * Tracking Code: BO * FINDINGS: CHEST: MEDIASTINUM AND LYMPH NODES: Heart and pericardium are unremarkable. The central airways are clear. Right lower paratracheal lymph node measuring 13 mm (image 21) appears enlarged from 10 mm remeasured. No supraclavicular adenopathy. No hilar adenopathy. No axillary lymphadenopathy. LUNGS AND PLEURA: Right lower lobe nodule superior to fiducial markers measures 2.6 x 2.1 cm compared to 2.4 x 2.0 cm. Visually the lesion is larger. Nodule in the superior aspect of the right upper lobe posteriorly measures 15 x 26 mm and not changed in size. Scattered ground glass nodules in the left upper lobe are unchanged. Solid nodule in the left upper lobe measuring 6 mm on image 46 compares to 6 mm but again this nodule visually appears slightly larger. ABDOMEN AND PELVIS: LIVER: Change along the falciform ligament is favored benign. No liver metastases. GALLBLADDER AND BILE DUCTS: Gallbladder is unremarkable. No biliary ductal dilatation. SPLEEN: No acute abnormality. PANCREAS: No acute abnormality. ADRENAL GLANDS: Enlargement of the left adrenal gland 25 x 16 mm compares to 22 x 13 mm. The left adrenal gland has washout characteristics consistent with benign adenoma. Thickened right adrenal gland is similar. KIDNEYS, URETERS AND BLADDER: No stones in the kidneys or ureters. No hydronephrosis. No perinephric or periureteral stranding. Urinary bladder is unremarkable. GI AND BOWEL: Stomach demonstrates no acute abnormality. There is no bowel obstruction. REPRODUCTIVE ORGANS: No acute abnormality. PERITONEUM AND RETROPERITONEUM: No ascites. No free air. VASCULATURE: Aorta is normal in caliber. ABDOMINAL AND  PELVIS LYMPH NODES: No lymphadenopathy. BONES AND SOFT TISSUES: No acute osseous abnormality. No aggressive osseous lesion. IMPRESSION: 1. Interval enlargement of right paratracheal lymph node, suspicious for nodal progression. 2. Apparent enlargement of the right lower lobe and a left upper lobe nodule; these changes are subtle and more visual than by direct measurement, concerning for progression. Consider FDG PET scan for further evaluation. 3. Left adrenal  adenoma. 4. No skeletal metastases. 5. No liver metastases. Electronically signed by: Norleen Boxer MD 11/16/2024 04:27 PM EST RP Workstation: [id number]   "

## 2024-12-09 ENCOUNTER — Other Ambulatory Visit: Payer: Self-pay | Admitting: Internal Medicine

## 2024-12-09 ENCOUNTER — Inpatient Hospital Stay

## 2024-12-09 ENCOUNTER — Inpatient Hospital Stay (HOSPITAL_BASED_OUTPATIENT_CLINIC_OR_DEPARTMENT_OTHER): Admitting: Nurse Practitioner

## 2024-12-09 VITALS — BP 153/82 | HR 100 | Temp 97.4°F | Resp 18 | Ht 61.0 in | Wt 90.0 lb

## 2024-12-09 VITALS — BP 159/90 | HR 99 | Resp 16

## 2024-12-09 DIAGNOSIS — C3491 Malignant neoplasm of unspecified part of right bronchus or lung: Secondary | ICD-10-CM

## 2024-12-09 DIAGNOSIS — J069 Acute upper respiratory infection, unspecified: Secondary | ICD-10-CM

## 2024-12-09 DIAGNOSIS — Z5111 Encounter for antineoplastic chemotherapy: Secondary | ICD-10-CM | POA: Diagnosis not present

## 2024-12-09 HISTORY — DX: Acute upper respiratory infection, unspecified: J06.9

## 2024-12-09 LAB — CBC WITH DIFFERENTIAL (CANCER CENTER ONLY)
Abs Immature Granulocytes: 0.57 K/uL — ABNORMAL HIGH (ref 0.00–0.07)
Basophils Absolute: 0.1 K/uL (ref 0.0–0.1)
Basophils Relative: 0 %
Eosinophils Absolute: 0.2 K/uL (ref 0.0–0.5)
Eosinophils Relative: 1 %
HCT: 33.2 % — ABNORMAL LOW (ref 36.0–46.0)
Hemoglobin: 10.7 g/dL — ABNORMAL LOW (ref 12.0–15.0)
Immature Granulocytes: 4 %
Lymphocytes Relative: 11 %
Lymphs Abs: 1.8 K/uL (ref 0.7–4.0)
MCH: 32.4 pg (ref 26.0–34.0)
MCHC: 32.2 g/dL (ref 30.0–36.0)
MCV: 100.6 fL — ABNORMAL HIGH (ref 80.0–100.0)
Monocytes Absolute: 1.6 K/uL — ABNORMAL HIGH (ref 0.1–1.0)
Monocytes Relative: 10 %
Neutro Abs: 11.7 K/uL — ABNORMAL HIGH (ref 1.7–7.7)
Neutrophils Relative %: 74 %
Platelet Count: 535 K/uL — ABNORMAL HIGH (ref 150–400)
RBC: 3.3 MIL/uL — ABNORMAL LOW (ref 3.87–5.11)
RDW: 19.1 % — ABNORMAL HIGH (ref 11.5–15.5)
WBC Count: 15.9 K/uL — ABNORMAL HIGH (ref 4.0–10.5)
nRBC: 0 % (ref 0.0–0.2)

## 2024-12-09 LAB — CMP (CANCER CENTER ONLY)
ALT: 15 U/L (ref 0–44)
AST: 32 U/L (ref 15–41)
Albumin: 3.6 g/dL (ref 3.5–5.0)
Alkaline Phosphatase: 132 U/L — ABNORMAL HIGH (ref 38–126)
Anion gap: 14 (ref 5–15)
BUN: 13 mg/dL (ref 6–20)
CO2: 22 mmol/L (ref 22–32)
Calcium: 9.9 mg/dL (ref 8.9–10.3)
Chloride: 102 mmol/L (ref 98–111)
Creatinine: 1.19 mg/dL — ABNORMAL HIGH (ref 0.44–1.00)
GFR, Estimated: 54 mL/min — ABNORMAL LOW
Glucose, Bld: 146 mg/dL — ABNORMAL HIGH (ref 70–99)
Potassium: 4 mmol/L (ref 3.5–5.1)
Sodium: 138 mmol/L (ref 135–145)
Total Bilirubin: 0.2 mg/dL (ref 0.0–1.2)
Total Protein: 8.1 g/dL (ref 6.5–8.1)

## 2024-12-09 LAB — TOTAL PROTEIN, URINE DIPSTICK: Protein, ur: >=300 mg/dL — AB

## 2024-12-09 MED ORDER — SODIUM CHLORIDE 0.9 % IV SOLN: Freq: Once | INTRAVENOUS | Status: AC

## 2024-12-09 MED ORDER — DEXAMETHASONE SODIUM PHOSPHATE 10 MG/ML IJ SOLN
10.0000 mg | Freq: Once | INTRAMUSCULAR | Status: AC
  Administered 2024-12-09: 10 mg via INTRAVENOUS

## 2024-12-09 MED ORDER — SODIUM CHLORIDE 0.9 % IV SOLN
500.0000 mg/m2 | Freq: Once | INTRAVENOUS | Status: AC
  Administered 2024-12-09: 700 mg via INTRAVENOUS
  Filled 2024-12-09: qty 20

## 2024-12-09 MED ORDER — CYANOCOBALAMIN 1000 MCG/ML IJ SOLN
1000.0000 ug | Freq: Once | INTRAMUSCULAR | Status: AC
  Administered 2024-12-09: 1000 ug via INTRAMUSCULAR
  Filled 2024-12-09: qty 1

## 2024-12-09 MED ORDER — PROCHLORPERAZINE MALEATE 10 MG PO TABS
10.0000 mg | ORAL_TABLET | Freq: Once | ORAL | Status: AC
  Administered 2024-12-09: 10 mg via ORAL
  Filled 2024-12-09: qty 1

## 2024-12-09 NOTE — Patient Instructions (Signed)
 [facility] - A DEPT OF [facility]  Discharge Instructions: Thank you for choosing [facility] to provide your oncology and hematology care.   If you have a lab appointment with the Cancer Center, please go directly to the Cancer Center and check in at the registration area.   Wear comfortable clothing and clothing appropriate for easy access to any Portacath or PICC line.   We strive to give you quality time with your provider. You may need to reschedule your appointment if you arrive late (15 or more minutes).  Arriving late affects you and other patients whose appointments are after yours.  Also, if you miss three or more appointments without notifying the office, you may be dismissed from the clinic at the provider's discretion.      For prescription refill requests, have your pharmacy contact our office and allow 72 hours for refills to be completed.    Today you received the following chemotherapy and/or immunotherapy agents: PEMEtrexed  Disodium (ALIMTA )      To help prevent nausea and vomiting after your treatment, we encourage you to take your nausea medication as directed.  BELOW ARE SYMPTOMS THAT SHOULD BE REPORTED IMMEDIATELY: *FEVER GREATER THAN 100.4 F (38 C) OR HIGHER *CHILLS OR SWEATING *NAUSEA AND VOMITING THAT IS NOT CONTROLLED WITH YOUR NAUSEA MEDICATION *UNUSUAL SHORTNESS OF BREATH *UNUSUAL BRUISING OR BLEEDING *URINARY PROBLEMS (pain or burning when urinating, or frequent urination) *BOWEL PROBLEMS (unusual diarrhea, constipation, pain near the anus) TENDERNESS IN MOUTH AND THROAT WITH OR WITHOUT PRESENCE OF ULCERS (sore throat, sores in mouth, or a toothache) UNUSUAL RASH, SWELLING OR PAIN  UNUSUAL VAGINAL DISCHARGE OR ITCHING   Items with * indicate a potential emergency and should be followed up as soon as possible or go to the Emergency Department if any problems should occur.  Please show the CHEMOTHERAPY ALERT  CARD or IMMUNOTHERAPY ALERT CARD at check-in to the Emergency Department and triage nurse.  Should you have questions after your visit or need to cancel or reschedule your appointment, please contact [facility] - A DEPT OF [facility]  Dept: [phone]  and follow the prompts.  Office hours are 8:00 a.m. to 4:30 p.m. Monday - Friday. Please note that voicemails left after 4:00 p.m. may not be returned until the following business day.  We are closed weekends and major holidays. You have access to a nurse at all times for urgent questions. Please call the main number to the clinic Dept: [phone] and follow the prompts.   For any non-urgent questions, you may also contact your provider using MyChart. We now offer e-Visits for anyone 8 and older to request care online for non-urgent symptoms. For details visit [email or web address].   Also download the MyChart app! Go to the app store, search MyChart, open the app, select [facility], and log in with your MyChart username and password.

## 2024-12-09 NOTE — Progress Notes (Signed)
 SDW call attempt.  Patient states she has flu like symptoms since 11/29/2024 to include runny nose, cough, nasal congestion, body aches and fever being controlled by Tylenol .  She did not get tested for flu.  Will notify Camie Lites, NP and Dr. Shelah for possible reschedule.

## 2024-12-10 ENCOUNTER — Encounter: Payer: Self-pay | Admitting: Internal Medicine

## 2024-12-10 ENCOUNTER — Encounter: Payer: Self-pay | Admitting: Nurse Practitioner

## 2024-12-10 ENCOUNTER — Encounter: Payer: Self-pay | Admitting: Sports Medicine

## 2024-12-10 NOTE — Progress Notes (Signed)
 Anesthesia Chart Review: SAME DAY WORK-UP  Case: [id number] Date/Time: 12/15/24 1245   Procedure: VIDEO BRONCHOSCOPY WITH ENDOBRONCHIAL NAVIGATION (Bilateral) - With pulsed electric field ablation (PEF)   Anesthesia type: General   Diagnosis:      Pulmonary nodules [R91.8]     Adenocarcinoma of right lung, stage 4 (HCC) [C34.91]   Pre-op diagnosis: Bilateral pulmonary nodules, history of adenocarcinoma   [facility] / [facility]   Surgeons: Shelah Lamar RAMAN, MD       DISCUSSION: Patient is a 54 year old female scheduled for the above procedure.  History includes former smoker (quit 12/10/2014), stage IV right lung adenocarcinoma (diagnosed following 04/24/2021 bronchoscopy; chemotherapy initiated 06/21/2021), lupus (SLE, cutaneous lupus), RA, HTN, palpitations, GERD, migraines.  She had recent cardiology evaluation for chest pain. July long term monitor showed predominantly SR with average HR 94 bpm, 2 short runs of SVT lasting 7 beats, triggered events correlated to SR/ST. TTE 07/24/2024 showed LVEF 50-55%, no RWMA, normal diastolic parameters, trivial MR. 07/30/2024 CCTA showed CAC 0, no evidence of CAD.   She had oncology follow-up with Hanford Moats, NP on 12/09/2024 . Note is still pended. RUL/RLL biopsies on 04/24/2021 + adenocarcinoma. No targetable mutations were noted on molecular studies. She was started on chemotherapy in July 2022. Current therapy: Palliative systemic chemotherapy with carboplatin  for AUC of 5, Alimta  500 mg/m, and Avastin  15 mg/kg every 3 weeks.  Starting with cycle 6, patient is on maintenance Alimta  and Avastin . Notes indicate that Avastin  (bevacizumab ) has been on hold due to elevated protein levels (last dose that I noted was 08/05/2024). Appears she received Alimta , prochlorperazine  maleate, dexamethasone , cyanocobalamin  on 12/09/2024.   She saw interventional pulmonologist on Dr. Shelah 11/04/2024 because serial imaging concerning for  disease progression and was being considered for repeat bronchoscopy with possible Aliya PEF (pulsed electric field). Procedure tentatively scheduled for 12/15/2024. If sample allows, oncology want to sent it for repeat molecular studies.   PAT phone RN spoke with patient on 12/09/2024. She apparently had flu-like symptoms (runny nose, nasal congestion, cough, body aches) starting on 11/29/2024. Sounds like she was improving but still congested. Last fever was reportedly ~ 12/07/2024. RN messaged pulmonology. She had oncology NP follow-up on 12/09/2024 and did receive Alimta . WBC was elevated. Unclear about urgency of procedure. Will attempt to reach out to pulmonology and/or oncology for input. If they think procedure is urgent enough to proceed then will likely need to follow-up with patient on 12/14/2024 to see how she is feeling. She is on plaquenil and daily doxycyline (for management of cutaneous lupus).     VS:  Wt Readings from Last 3 Encounters:  12/09/24 40.8 kg  11/04/24 42.3 kg  10/28/24 43.7 kg   BP Readings from Last 3 Encounters:  12/09/24 (!) 159/90  12/09/24 (!) 153/82  11/18/24 (!) 149/73   Pulse Readings from Last 3 Encounters:  12/09/24 99  12/09/24 100  11/18/24 80     PROVIDERS: [name], PA-C is PCP  MadireddyAlean, MD is cardiologist  Sherrod Sherrod, MD is HEM-ONC Mikel Leeroy Caldron, MD is rheumatologist [facility]    LABS: Most recent lab results in [facility] include: Lab Results  Component Value Date   WBC 15.9 (H) 12/09/2024   HGB 10.7 (L) 12/09/2024   HCT 33.2 (L) 12/09/2024   PLT 535 (H) 12/09/2024   GLUCOSE 146 (H) 12/09/2024   CHOL 253 (H) 07/01/2024   TRIG 153 (H) 07/01/2024  HDL 73 07/01/2024   LDLCALC 153 (H) 07/01/2024   ALT 15 12/09/2024   AST 32 12/09/2024   NA 138 12/09/2024   K 4.0 12/09/2024   CL 102 12/09/2024   CREATININE 1.19 (H) 12/09/2024   BUN 13 12/09/2024   CO2 22 12/09/2024   TSH 0.530 10/24/2022    INR 1.0 04/12/2021     IMAGES: CT Chest/abd/pelvis 11/11/2024: IMPRESSION: 1. Interval enlargement of right paratracheal lymph node, suspicious for nodal progression. 2. Apparent enlargement of the right lower lobe and a left upper lobe nodule; these changes are subtle and more visual than by direct measurement, concerning for progression. Consider FDG PET scan for further evaluation. 3. Left adrenal adenoma. 4. No skeletal metastases. 5. No liver metastases.  MRI Brain 01/30/2023: IMPRESSION: 1. No evidence of intracranial metastases or acute intracranial abnormality. 2. Unchanged 3 mm left temporal meningioma.  EKG: EKG 06/30/2024:  Normal sinus rhythm Nonspecific ST abnormality Abnormal ECG No previous ECGs available Confirmed by Liborio Hai reddy [phone]) on 06/30/2024 3:23:56 PM  CV: CTA Coronary 07/30/2024: IMPRESSION: 1. Coronary calcium  score of 0. 2. Normal coronary origin with right dominance. 3. CAD-RADS 0. No evidence of CAD (0%). Consider non-atherosclerotic causes of chest pain. 4. Atherosclerosis noted in the descending thoracic and abdominal aorta.  Echo 07/24/2024: IMPRESSIONS   1. Left ventricular ejection fraction, by estimation, is 50 to 55%. The  left ventricle has low normal function. The left ventricle has no regional  wall motion abnormalities. Left ventricular diastolic parameters were  normal. The average left ventricular   global longitudinal strain is -13.8 %. The global longitudinal strain is  abnormal.   2. Right ventricular systolic function is normal. The right ventricular  size is normal. There is normal pulmonary artery systolic pressure.   3. The mitral valve is normal in structure. Trivial mitral valve  regurgitation. No evidence of mitral stenosis.   4. The aortic valve is normal in structure. Aortic valve regurgitation is  not visualized. No aortic stenosis is present.   5. The inferior vena cava is normal in size with greater  than 50%  respiratory variability, suggesting right atrial pressure of 3 mmHg.    Long term monitor 06/30/2024 - 07/07/2024:   Predominantly sinus rhythm with average heart rate 94/min [ranging from 60 to 152 bpm].   Rare ventricular and supraventricular ectopy burden, less than 1%.   2 short runs of SVT with the longest episode lasting 7 beats, asymptomatic.   Total 6 patient triggered events and diary entries correlated with normal heart rhythm ranging from heart rates 76 to 133 bpm.   No high-grade AV blocks, pauses, sustained arrhythmias.    Past Medical History:  Diagnosis Date   Adenocarcinoma of right lung (HCC) 02/15/2022   Adenocarcinoma of right lung, stage 4 (HCC) 06/14/2021   Adenocarcinoma, lung, right (HCC) 05/03/2021   Kirsten Williams is a 54 y.o. female with a history of lung nodules dating back to 2019 who is referred in consultation with Rankin Dike, PA-C for assessment and management. She is a former smoker having quit in 2016. 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 She missed her screening CT last year and imaging was ordered last month. 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   Hypercalcemia 08/23/2022   Hypertension, essential, benign    Hypokalemia 04/22/2024   INSOMNIA NOS 02/06/2007   Qualifier: Diagnosis of   By: Damien Folks         MIGRAINE, UNSPEC., W/O INTRACTABLE MIGRAINE 02/06/2007   Qualifier: Diagnosis of   By: Damien Folks      Replacing diagnoses that were inactivated after the 03/11/23 regulatory import      Osteoporosis 03/01/2022   Palpitations    Pleuritic chest pain 10/24/2022   Post-op pain 05/04/2021   Rheumatoid arthritis (HCC) 02/15/2022   Skin infection 11/13/2023   SLE (systemic lupus erythematosus) (HCC) 05/03/2021   SLE (systemic lupus erythematosus) (HCC) 05/03/2021   TOBACCO DEPENDENCE 02/06/2007   Qualifier: Diagnosis of   By: Damien Folks          Past Surgical History:  Procedure Laterality Date   ABDOMINAL HYSTERECTOMY     BRONCHIAL BIOPSY  04/24/2021   Procedure: BRONCHIAL BIOPSIES;  Surgeon: Shelah Lamar RAMAN, MD;  Location[facility] ENDOSCOPY;  Service: Pulmonary;;   BRONCHIAL BRUSHINGS  04/24/2021   Procedure: BRONCHIAL BRUSHINGS;  Surgeon: Shelah Lamar RAMAN, MD;  Location: [facility] ENDOSCOPY;  Service: Pulmonary;;   BRONCHIAL NEEDLE ASPIRATION BIOPSY  04/24/2021   Procedure: BRONCHIAL NEEDLE ASPIRATION BIOPSIES;  Surgeon: Shelah Lamar RAMAN, MD;  Location: [facility] ENDOSCOPY;  Service: Pulmonary;;   BRONCHIAL WASHINGS  04/24/2021   Procedure: BRONCHIAL WASHINGS;  Surgeon: Shelah Lamar RAMAN, MD;  Location: [facility] ENDOSCOPY;  Service: Pulmonary;;   CESAREAN SECTION  11/09/1990   DILATION AND CURETTAGE OF UTERUS Bilateral 09/09/1996   FIDUCIAL MARKER PLACEMENT  04/24/2021   Procedure: FIDUCIAL MARKER PLACEMENT;  Surgeon: Shelah Lamar RAMAN, MD;  Location: [facility] ENDOSCOPY;  Service: Pulmonary;;   TONSILLECTOMY  12/10/1977   VIDEO BRONCHOSCOPY WITH ENDOBRONCHIAL NAVIGATION Bilateral 04/24/2021   Procedure: VIDEO BRONCHOSCOPY WITH ENDOBRONCHIAL NAVIGATION;  Surgeon: Shelah Lamar RAMAN, MD;  Location: [facility] END[facility];  Service: Pulmonary;  Laterality: Bilateral;    MEDICATIONS:  Acetaminophen  (TYLENOL ) 325 MG CAPS   amLODipine (NORVASC) 10 MG tablet   Calcium  Carbonate-Vit D-Min (CALCIUM  1200 PO)   carvedilol  (COREG ) 6.25 MG tablet   CLOBETASOL PROPIONATE E 0.05 % emollient cream   cyclobenzaprine (FLEXERIL) 10 MG tablet   denosumab  (PROLIA ) 60 MG/ML SOSY injection   dicyclomine (BENTYL) 10 MG capsule    diphenhydrAMINE  (BENADRYL ) 25 MG tablet   doxycycline (VIBRA-TABS) 100 MG tablet   famotidine  (PEPCID ) 40 MG tablet   fluticasone (FLONASE) 50 MCG/ACT nasal spray   folic acid  (FOLVITE ) 1 MG tablet   furosemide (LASIX) 20 MG tablet   gabapentin (NEURONTIN) 300 MG capsule   hydrocortisone  1 % lotion   hydroxychloroquine (PLAQUENIL) 200 MG tablet   Multiple Vitamins-Minerals (MULTI ADULT GUMMIES) CHEW   omeprazole (PRILOSEC) 40 MG capsule   oxyCODONE -acetaminophen  (PERCOCET/ROXICET) 5-325 MG tablet   potassium chloride  SA (KLOR-CON  M) 20 MEQ tablet   potassium chloride  SA (KLOR-CON  M) 20 MEQ tablet   Probiotic Product (PROBIOTIC-10 PO)   prochlorperazine  (COMPAZINE ) 10 MG tablet   rizatriptan (MAXALT-MLT) 10 MG disintegrating tablet   rosuvastatin  (CRESTOR ) 5 MG tablet   tetrahydrozoline 0.05 % ophthalmic solution   triamcinolone ointment (KENALOG) 0.5 %    Kirsten Mollenkopf, PA-C Surgical Short Stay/Anesthesiology [facility] Phone [phone] [facility] Phone [phone] 12/10/2024 11:06  AM

## 2024-12-11 ENCOUNTER — Encounter (HOSPITAL_COMMUNITY): Payer: Self-pay | Admitting: Emergency Medicine

## 2024-12-11 ENCOUNTER — Telehealth: Payer: Self-pay | Admitting: Emergency Medicine

## 2024-12-11 DIAGNOSIS — R918 Other nonspecific abnormal finding of lung field: Secondary | ICD-10-CM

## 2024-12-11 NOTE — Telephone Encounter (Signed)
 I was able to reach the patient - we decided to postpone her bronchoscopy. She is on doxycycline currently. Tentatively would be able to reschedule for either 1/26 or 27. I will discuss further with her.

## 2024-12-11 NOTE — Telephone Encounter (Signed)
 Called to discuss patient's URI symptoms with her, make a final determination regarding her procedure planned for next week. If she has not improved significantly then we will need to reschedule her bronchoscopy, biopsies and pulsed electric field ablation.

## 2024-12-15 ENCOUNTER — Encounter (HOSPITAL_COMMUNITY): Payer: Self-pay | Admitting: Vascular Surgery

## 2024-12-15 ENCOUNTER — Ambulatory Visit (HOSPITAL_COMMUNITY): Admission: RE | Admit: 2024-12-15 | Source: Home / Self Care | Admitting: Emergency Medicine

## 2024-12-15 ENCOUNTER — Encounter (HOSPITAL_COMMUNITY): Admission: RE | Payer: Self-pay | Source: Home / Self Care

## 2024-12-15 DIAGNOSIS — C3491 Malignant neoplasm of unspecified part of right bronchus or lung: Secondary | ICD-10-CM | POA: Insufficient documentation

## 2024-12-15 DIAGNOSIS — R918 Other nonspecific abnormal finding of lung field: Secondary | ICD-10-CM | POA: Insufficient documentation

## 2024-12-15 SURGERY — VIDEO BRONCHOSCOPY WITH ENDOBRONCHIAL NAVIGATION
Anesthesia: General | Laterality: Bilateral

## 2024-12-16 NOTE — Telephone Encounter (Signed)
 Can you call the patient and see if she is improving.  If so I would like to try to get her scheduled for 01/05/2025 for her bronchoscopy with PEF

## 2024-12-16 NOTE — Telephone Encounter (Signed)
 Called and spoke with the pt.  Pt does have ongoing congested cough w/ clear phlegm. Chest tightness when coughing.  Body is still weak & fatigued.  Clear/yellow mucus when blowing nose. Pt stated she is feeling better compared to two weeks ago.  Currently still on doxycycline that is managed through dermatologist.  Pt is up for the bronch if RB thinks she can handle it. Pt does have tooth implant broken and it is stabbing through her mouth- going to dentist next wk to get this addressed.

## 2024-12-17 ENCOUNTER — Telehealth: Payer: Self-pay | Admitting: Emergency Medicine

## 2024-12-17 NOTE — Telephone Encounter (Signed)
 [facility] can you please type up letter for bronch 1/27 and upload into Mychart.  Please route back to me once finished and I will mail to pt since I have other forms to mail to her as well.  Thanks!

## 2024-12-17 NOTE — Telephone Encounter (Signed)
 ATC x2. LMTCB regarding Bronchoscopy.

## 2024-12-17 NOTE — Addendum Note (Signed)
 Addended by: SHELAH LAMAR RAMAN on: 12/17/2024 08:46 AM   Modules accepted: Orders

## 2024-12-17 NOTE — Telephone Encounter (Signed)
 Please at the patient know that I have rescheduled her bronchoscopy with pulsed electric field ablation for 01/05/2025.  Thank you

## 2024-12-17 NOTE — Telephone Encounter (Signed)
 Copied from CRM [id number]. Topic: Appointments - Appointment Info/Confirmation >> Dec 17, 2024  2:45 PM Corean SAUNDERS wrote: Patient missed a call from [facility] regarding the Bronchoscopy that Dr. Shelah rescheduled to 1/27 patient made aware but is requesting [name] to please mail her all of the appointment information as she is not good with Mychart.   *Patient would also like [name] to know she's feeling even better than yesterday.*

## 2024-12-17 NOTE — Telephone Encounter (Signed)
ATC x1.  LMTCB. 

## 2024-12-18 ENCOUNTER — Encounter: Payer: Self-pay | Admitting: Emergency Medicine

## 2024-12-18 NOTE — Telephone Encounter (Signed)
 Letter is done.

## 2024-12-18 NOTE — Telephone Encounter (Signed)
 Copied from CRM [id number]. Topic: Appointments - Appointment Info/Confirmation >> Dec 17, 2024  2:45 PM Kirsten Williams wrote: Patient missed a call from [facility] regarding the Bronchoscopy that Dr. Shelah rescheduled to 1/27 patient made aware but is requesting [name] to please mail her all of the appointment information as she is not good with Mychart.   *Patient would also like [name] to know she's feeling even better than yesterday.* >> Dec 18, 2024 10:10 AM [name] wrote: Pt returning [name]'s call, advised her of the letter that was mailed out and the appt for the procedure. Thanks    Letter has been mailed to the pt with bronchoscopy information. Nothing further needed.

## 2024-12-18 NOTE — Telephone Encounter (Signed)
 Pt aware and letter has been sent with information. Nothing further needed.

## 2024-12-22 ENCOUNTER — Ambulatory Visit: Admitting: Acute Care

## 2024-12-22 NOTE — Progress Notes (Signed)
 [facility] OFFICE PROGRESS NOTE  [name], PA-C [address] [place] [address]  DIAGNOSIS: Stage IV (T4, N2, M1 a) non-small cell lung cancer, adenocarcinoma presented with multifocal disease involving the right upper lobe, right lower lobe as well as suspicious lower paratracheal lymphadenopathy and groundglass nodules in the left lung diagnosed in May 2022. The patient had molecular studies performed by [facility] [employer] and that showed no detectable mutation but this is likely secondary to low-level circulating free tumor DNA   PRIOR THERAPY: None   CURRENT THERAPY: Palliative systemic chemotherapy with carboplatin  for AUC of 5, Alimta  500 Mg/M2 and Avastin  15 Mg/KG every 3 weeks.  The patient is not a great candidate for immunotherapy in the event that she has a genetic mutation.  She is status post 59 cycles. Starting from cycle #6, the patient will start maintenance Alimta  and Avastin .   INTERVAL HISTORY: Kirsten Williams 54 y.o. female returns to the clinic today for a follow-up visit.  The patient was last seen in the clinic 3 weeks ago.  The patient is expected to have bronchoscopy and PEF on 01/05/2025.  He is concerned about the weather that may interfere with her procedure.  The patient had struggled with a respiratory infection around Christmas and has had symptoms prolonged characterized by lingering cough with copious phlegm, intermittent vomiting, poor oral intake, significant weakness, abnormal weight loss, and muscle mass loss. She continues to have a residual cough with phlegm production and takes diphenhydramine  at night for symptom control and sleep. She denies current fever, new bleeding, or changes in breathing aside from the lingering cough.  She is slowly improving.  She is wondering if she can cancel her treatment today because she typically feels weak following treatment and wants to feel strong for her procedure next week.  No ongoing nausea,  vomiting, diarrhea, or constipation outside of the acute illness period.  he is maintaining good hydration and is mindful of fluid intake timing to minimize nocturia.  Last week, she experienced a dental complication when a dental implant fell out, resulting in bone protrusion through the gum. She underwent a procedure to remove the bone fragment and received dissolvable sutures. She had minor oral bleeding when the blood clot dislodged but has not had ongoing bleeding. She is scheduled for dental follow-up on February 2nd.  She is here today for evaluation and repeat blood work before undergoing cycle #60.   MEDICAL HISTORY: Past Medical History:  Diagnosis Date   Adenocarcinoma of right lung (HCC) 02/15/2022   Adenocarcinoma of right lung, stage 4 (HCC) 06/14/2021   Adenocarcinoma, lung, right (HCC) 05/03/2021   Kirsten Williams is a 54 y.o. female with a history of lung nodules dating back to 2019 who is referred in consultation with Lot Montey, PA-C for assessment and management. She is a former smoker having quit in 2016. 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 She missed her screening CT last year and imaging was ordered last month. 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/16/2023   Hypercalcemia 08/23/2022   Hypertension, essential, benign    Hypokalemia 04/22/2024   INSOMNIA NOS 02/06/2007   Qualifier: Diagnosis of   By: Damien Folks         MIGRAINE, UNSPEC., W/O INTRACTABLE MIGRAINE 02/06/2007   Qualifier: Diagnosis of   By: Damien Folks       Replacing diagnoses that were inactivated after the 03/11/23 regulatory import     Osteoporosis 03/01/2022   Palpitations    Pleuritic chest pain 10/24/2022   Post-op pain 05/04/2021   Rheumatoid arthritis (HCC) 02/15/2022   Skin infection 11/13/2023   SLE (systemic lupus erythematosus) (HCC) 05/03/2021   SLE (systemic lupus erythematosus) (HCC) 05/03/2021   TOBACCO DEPENDENCE 02/06/2007   Qualifier: Diagnosis of   By: Damien Folks         URI (upper respiratory infection) 12/09/2024   tx w/doxycycline 7-10 days and Robitussin & DayQuil as needed    ALLERGIES:  is allergic to clindamycin/lincomycin, losartan, and carboplatin .  MEDICATIONS:  Current Outpatient Medications  Medication Sig Dispense Refill   Acetaminophen  (TYLENOL ) 325 MG CAPS Take 325 mg by mouth daily as needed (pain).     amLODipine (NORVASC) 10 MG tablet Take 10 mg by mouth daily.     Calcium  Carbonate-Vit D-Min (CALCIUM  1200 PO) Take 1 capsule by mouth daily.     carvedilol  (COREG ) 6.25 MG tablet Take 1 tablet (6.25 mg total) by mouth 2 (two) times daily. 180 tablet 3   CLOBETASOL PROPIONATE E 0.05 % emollient cream Apply topically.     cyclobenzaprine (FLEXERIL) 10 MG tablet Take 10 mg by mouth at bedtime as needed for muscle spasms.     denosumab  (PROLIA ) 60 MG/ML SOSY injection Inject 60 mg into the skin every 6 (six) months.     dicyclomine (BENTYL) 10 MG capsule Take 10 mg by mouth every 6 (six) hours as needed for spasms.     diphenhydrAMINE  (BENADRYL ) 25 MG tablet Take 25 mg by mouth daily as needed for allergies.     doxycycline (VIBRA-TABS) 100 MG tablet Take 100 mg by mouth daily.     famotidine  (PEPCID ) 40 MG tablet Take 40 mg by mouth daily.     fluticasone (FLONASE) 50 MCG/ACT nasal spray Place 2 sprays into both nostrils daily.     folic acid  (FOLVITE ) 1 MG tablet TAKE 1 TABLET(1 MG) BY MOUTH DAILY 30 tablet 4   furosemide (LASIX) 20 MG tablet Take 20 mg by mouth daily.     gabapentin (NEURONTIN) 300  MG capsule Take 300 mg by mouth at bedtime.     hydrocortisone  1 % lotion Apply 1 application topically 2 (two) times daily. 118 mL 0   hydroxychloroquine (PLAQUENIL) 200 MG tablet Take 200 mg by mouth 2 (two) times daily.     Multiple Vitamins-Minerals (MULTI ADULT GUMMIES) CHEW Chew 2 tablets by mouth daily.     omeprazole (PRILOSEC) 40 MG capsule Take 40 mg by mouth daily as needed (acid reflux).     oxyCODONE -acetaminophen  (PERCOCET/ROXICET) 5-325 MG tablet Take 1 tablet by mouth every 8 (eight) hours as needed for severe pain (pain score 7-10). 30 tablet 0   potassium chloride  SA (KLOR-CON  M) 20 MEQ tablet Take 1 tablet (20 mEq total) by mouth 2 (two) times daily. 16 tablet 0   potassium chloride  SA (KLOR-CON  M) 20 MEQ tablet Take 1 tablet (20 mEq total) by mouth 2 (two) times daily. 14 tablet 0   Probiotic Product (PROBIOTIC-10 PO) Take 1 tablet  by mouth daily.     prochlorperazine  (COMPAZINE ) 10 MG tablet TAKE 1 TABLET(10 MG) BY MOUTH EVERY 6 HOURS AS NEEDED 30 tablet 2   rizatriptan (MAXALT-MLT) 10 MG disintegrating tablet Take 10 mg by mouth 2 (two) times daily as needed for migraine.     rosuvastatin  (CRESTOR ) 5 MG tablet Take 1 tablet (5 mg total) by mouth 3 (three) times a week. 48 tablet 3   tetrahydrozoline 0.05 % ophthalmic solution Place 1 drop into both eyes once. Systane Eye Drops once daily both eyes     triamcinolone ointment (KENALOG) 0.5 % Apply 1 Application topically 3 (three) times daily.     No current facility-administered medications for this visit.    SURGICAL HISTORY:  Past Surgical History:  Procedure Laterality Date   ABDOMINAL HYSTERECTOMY     BRONCHIAL BIOPSY  04/24/2021   Procedure: BRONCHIAL BIOPSIES;  Surgeon: Shelah Lamar RAMAN, MD;  Location: [facility] ENDOSCOPY;  Service: Pulmonary;;   BRONCHIAL BRUSHINGS  04/24/2021   Procedure: BRONCHIAL BRUSHINGS;  Surgeon: Shelah Lamar RAMAN, MD;  Location: [facility] ENDOSCOPY;  Service: Pulmonary;;   BRONCHIAL NEEDLE ASPIRATION BIOPSY   04/24/2021   Procedure: BRONCHIAL NEEDLE ASPIRATION BIOPSIES;  Surgeon: Shelah Lamar RAMAN, MD;  Location: [facility] ENDOSCOPY;  Service: Pulmonary;;   BRONCHIAL WASHINGS  04/24/2021   Procedure: BRONCHIAL WASHINGS;  Surgeon: Shelah Lamar RAMAN, MD;  Location: [facility] ENDOSCOPY;  Service: Pulmonary;;   CESAREAN SECTION  11/09/1990   DILATION AND CURETTAGE OF UTERUS Bilateral 09/09/1996   FIDUCIAL MARKER PLACEMENT  04/24/2021   Procedure: FIDUCIAL MARKER PLACEMENT;  Surgeon: Shelah Lamar RAMAN, MD;  Location: [facility] ENDOSCOPY;  Service: Pulmonary;;   TONSILLECTOMY  12/10/1977   VIDEO BRONCHOSCOPY WITH ENDOBRONCHIAL NAVIGATION Bilateral 04/24/2021   Procedure: VIDEO BRONCHOSCOPY WITH ENDOBRONCHIAL NAVIGATION;  Surgeon: Shelah Lamar RAMAN, MD;  Location: [facility] ENDOSCOPY;  Service: Pulmonary;  Laterality: Bilateral;    REVIEW OF SYSTEMS:   Review of Systems  Constitutional: Positive for fatigue and weight loss. Negative for chills and fever.  HENT: Positive for dental concern with implant. Negative for mouth sores, nosebleeds, sore throat and trouble swallowing.   Eyes: Negative for eye problems and icterus.  Respiratory: Positive for occasional shortness of breath and improving but lingering cough. Negative for hemoptysis and wheezing.   Cardiovascular: Negative for chest pain (gets chest tightness after treatment but none at this time). Mild ankle swelling intermittently (improved today).  Gastrointestinal: No recent nausea or constipation. Negative for diarrhea, and vomiting.  Genitourinary: Negative for bladder incontinence, difficulty urinating, dysuria, frequency and hematuria.   Musculoskeletal: Negative for back pain, gait problem, neck pain and neck stiffness.  Skin: Improved rash and itching.  Neurological: Negative for dizziness, extremity weakness, gait problem, headaches, and seizures.  Hematological: Negative for adenopathy. Does not bleed easily. She reports easy bruising.   Psychiatric/Behavioral: Negative for  confusion, depression and sleep disturbance. The patient is not nervous/anxious.      PHYSICAL EXAMINATION:  Blood pressure 133/64, pulse 88, temperature (!) 97.5 F (36.4 C), temperature source Temporal, resp. rate 16, height 5' 1 (1.549 m), weight 94 lb 14.4 oz (43 kg), SpO2 100%.  ECOG PERFORMANCE STATUS: 1  Physical Exam  Constitutional: Oriented to person, place, and time and well-developed, well-nourished, and in no distress.  HENT:  Head: Normocephalic and atraumatic.  Mouth/Throat: Oropharynx is clear and moist. No oropharyngeal exudate.  Eyes: Conjunctivae are normal. Right eye exhibits no discharge. Left eye exhibits no discharge. No scleral icterus.  Neck: Normal range of motion.  Neck supple.  Cardiovascular: Normal rate, regular rhythm, normal heart sounds and intact distal pulses.   Pulmonary/Chest: Effort normal and breath sounds normal. No respiratory distress. No wheezes. No rales.  Abdominal: Soft. Bowel sounds are normal. Exhibits no distension and no mass. There is no tenderness.  Musculoskeletal: Normal range of motion. Exhibits no edema.  Lymphadenopathy:    No cervical adenopathy.  Neurological: Alert and oriented to person, place, and time. Exhibits normal muscle tone. Gait normal. Coordination normal.  Skin: Skin is warm and dry.  Not diaphoretic. No erythema. No pallor.  Psychiatric: Mood, memory and judgment normal.  Vitals reviewed.  LABORATORY DATA: Lab Results  Component Value Date   WBC 7.9 12/30/2024   HGB 8.5 (L) 12/30/2024   HCT 26.2 (L) 12/30/2024   MCV 102.3 (H) 12/30/2024   PLT 432 (H) 12/30/2024      Chemistry      Component Value Date/Time   NA 137 12/30/2024 0931   NA 142 07/01/2024 0902   K 3.8 12/30/2024 0931   CL 100 12/30/2024 0931   CO2 22 12/30/2024 0931   BUN 16 12/30/2024 0931   BUN 29 (H) 07/01/2024 0902   CREATININE 1.13 (H) 12/30/2024 0931   GLU 107 03/13/2021 0000      Component Value Date/Time   CALCIUM  9.4  12/30/2024 0931   ALKPHOS 120 12/30/2024 0931   AST 28 12/30/2024 0931   ALT 15 12/30/2024 0931   BILITOT <0.2 12/30/2024 0931       RADIOGRAPHIC STUDIES:  No results found.   ASSESSMENT/PLAN:  This is a very pleasant 54 year old Caucasian female diagnosed with stage IV (T4, N2, M1 a) non-small cell lung cancer, adenocarcinoma presented with multifocal disease involving the right upper lobe, right lower lobe as well as suspicious lower paratracheal lymphadenopathy and groundglass nodules in the left lung diagnosed in May 2022. The patient had molecular studies performed by [facility] [employer] and that showed no detectable mutation but this is likely secondary to low-level circulating free tumor DNA. If the patient has disease progression in the future, then we will likely retest her for molecular studies.    The patient is currently undergoing systemic chemotherapy with carboplatin  for an AUC 5, Alimta  500 mg per metered square, and and Avastin  15 mg/kg IV every 3 weeks.  She is status post 59 cycles.  Starting from cycle #7, the patient has been on maintenance treatment with Alimta  and Avastin .  The patient requested to delay her treatment until after her upcoming procedure.   We will delay her until 01/12/25.    Labs were reviewed. Her urine protein is elevated. I will remove avastin  from her treatment plan for the next cycle.   We will remove the avastin  today.  Recommend that she proceed with cycle #60 today as scheduled with alimta .   She is scheduled for PEF on 01/06/24. I previously reach out to Dr. Shelah about repeat biopsy for molecular studies during the bronchoscopy. He stated that would not be a problem.   Advised to report unusual or prolonged bleeding due to RBC decrease and prior dental bleeding.I have added an order for sample to blood bank.    Abnormal weight loss Significant weight and muscle loss during influenza, contributing to weakness and chemotherapy delay. - Assessed  weight and physical status during visit. - Advised to focus on regaining strength and weight before resuming chemotherapy.   The patient was advised to call immediately if she has any concerning symptoms in the  interval. The patient voices understanding of current disease status and treatment options and is in agreement with the current care plan. All questions were answered. The patient knows to call the clinic with any problems, questions or concerns. We can certainly see the patient much sooner if necessary    Orders Placed This Encounter  Procedures   CBC with Differential (Cancer Center Only)    Standing Status:   Future    Expected Date:   01/12/2025    Expiration Date:   01/12/2026   CMP (Cancer Center only)    Standing Status:   Future    Expected Date:   01/12/2025    Expiration Date:   01/12/2026   Total Protein, Urine dipstick    Standing Status:   Future    Expected Date:   01/12/2025    Expiration Date:   01/12/2026   Sample to Blood Bank    Standing Status:   Future    Expected Date:   12/31/2024    Expiration Date:   12/30/2025     The total time spent in the appointment was 20-29 minutes  Scarlet Abad L Abimbola Aki, PA-C 12/30/24

## 2024-12-30 ENCOUNTER — Telehealth: Payer: Self-pay

## 2024-12-30 ENCOUNTER — Inpatient Hospital Stay

## 2024-12-30 ENCOUNTER — Inpatient Hospital Stay: Attending: Hematology and Oncology

## 2024-12-30 ENCOUNTER — Inpatient Hospital Stay: Admitting: Physician Assistant

## 2024-12-30 VITALS — BP 133/64 | HR 88 | Temp 97.5°F | Resp 16 | Ht 61.0 in | Wt 94.9 lb

## 2024-12-30 DIAGNOSIS — C3491 Malignant neoplasm of unspecified part of right bronchus or lung: Secondary | ICD-10-CM

## 2024-12-30 LAB — CBC WITH DIFFERENTIAL (CANCER CENTER ONLY)
Abs Immature Granulocytes: 0.17 K/uL — ABNORMAL HIGH (ref 0.00–0.07)
Basophils Absolute: 0 K/uL (ref 0.0–0.1)
Basophils Relative: 0 %
Eosinophils Absolute: 0.2 K/uL (ref 0.0–0.5)
Eosinophils Relative: 3 %
HCT: 26.2 % — ABNORMAL LOW (ref 36.0–46.0)
Hemoglobin: 8.5 g/dL — ABNORMAL LOW (ref 12.0–15.0)
Immature Granulocytes: 2 %
Lymphocytes Relative: 26 %
Lymphs Abs: 2 K/uL (ref 0.7–4.0)
MCH: 33.2 pg (ref 26.0–34.0)
MCHC: 32.4 g/dL (ref 30.0–36.0)
MCV: 102.3 fL — ABNORMAL HIGH (ref 80.0–100.0)
Monocytes Absolute: 1.4 K/uL — ABNORMAL HIGH (ref 0.1–1.0)
Monocytes Relative: 18 %
Neutro Abs: 4.1 K/uL (ref 1.7–7.7)
Neutrophils Relative %: 51 %
Platelet Count: 432 K/uL — ABNORMAL HIGH (ref 150–400)
RBC: 2.56 MIL/uL — ABNORMAL LOW (ref 3.87–5.11)
RDW: 20.7 % — ABNORMAL HIGH (ref 11.5–15.5)
WBC Count: 7.9 K/uL (ref 4.0–10.5)
nRBC: 0 % (ref 0.0–0.2)

## 2024-12-30 LAB — CMP (CANCER CENTER ONLY)
ALT: 15 U/L (ref 0–44)
AST: 28 U/L (ref 15–41)
Albumin: 3.2 g/dL — ABNORMAL LOW (ref 3.5–5.0)
Alkaline Phosphatase: 120 U/L (ref 38–126)
Anion gap: 15 (ref 5–15)
BUN: 16 mg/dL (ref 6–20)
CO2: 22 mmol/L (ref 22–32)
Calcium: 9.4 mg/dL (ref 8.9–10.3)
Chloride: 100 mmol/L (ref 98–111)
Creatinine: 1.13 mg/dL — ABNORMAL HIGH (ref 0.44–1.00)
GFR, Estimated: 58 mL/min — ABNORMAL LOW
Glucose, Bld: 144 mg/dL — ABNORMAL HIGH (ref 70–99)
Potassium: 3.8 mmol/L (ref 3.5–5.1)
Sodium: 137 mmol/L (ref 135–145)
Total Bilirubin: <0.2 mg/dL (ref 0.0–1.2)
Total Protein: 6.8 g/dL (ref 6.5–8.1)

## 2024-12-30 LAB — TOTAL PROTEIN, URINE DIPSTICK: Protein, ur: >=300 mg/dL — AB

## 2024-12-30 NOTE — Telephone Encounter (Unsigned)
 Copied from CRM #[id number]. Topic: Appointments - Appointment Info/Confirmation >> Dec 30, 2024  8:48 AM Leila C wrote: Patient/patient representative is calling for information regarding an appointment.  Patient [phone] wants to speak Dr. Lanny nurse for the Bronchoscopy protocol since there's bad weather, and patient lives 45 minutes away and cannot make it in. Unable to reach to [facility], please call back.

## 2024-12-31 NOTE — Telephone Encounter (Signed)
 Copied from CRM #[id number]. Topic: Appointments - Scheduling Inquiry for Clinic >> Dec 31, 2024 10:21 AM Benton KIDD wrote: Reason for CRM: patient is wanting to cancel her procedure on Tuesday . Please give patient a call concerning cancelling her procedure she has 1/27 [phone]

## 2025-01-01 NOTE — Telephone Encounter (Signed)
 Noted, thanks!

## 2025-01-05 DIAGNOSIS — R918 Other nonspecific abnormal finding of lung field: Secondary | ICD-10-CM | POA: Insufficient documentation

## 2025-01-09 NOTE — Progress Notes (Unsigned)
 [facility] OFFICE PROGRESS NOTE  Kirsten Lot, PA-C [address] [place] [address]  DIAGNOSIS: Stage IV (T4, N2, M1 a) non-small cell lung cancer, adenocarcinoma presented with multifocal disease involving the right upper lobe, right lower lobe as well as suspicious lower paratracheal lymphadenopathy and groundglass nodules in the left lung diagnosed in May 2022. The patient had molecular studies performed by [facility] and that showed no detectable mutation but this is likely secondary to low-level circulating free tumor DNA   PRIOR THERAPY: None  CURRENT THERAPY: Palliative systemic chemotherapy with carboplatin  for AUC of 5, Alimta  500 Mg/M2 and Avastin  15 Mg/KG every 3 weeks.  The patient is not a great candidate for immunotherapy in the event that she has a genetic mutation.  She is status post 59 cycles. Starting from cycle #6, the patient will start maintenance Alimta  and Avastin .   INTERVAL HISTORY: Kirsten Williams 54 y.o. female returns to the clinic today for a follow-up visit.  The patient was last seen in the clinic 2 weeks ago.   The patient is expected to have bronchoscopy and PEF. She was sick in early January so it had been rescheduled. However, due to weather, her most recent PEF was cancelled. It is expected on 01/19/25.   She denies current fever, new bleeding, or changes in breathing. She had a lingering cough since being sick around Christmas but it has since ***.   No ongoing nausea, vomiting, diarrhea, or constipation outside of the acute illness period.   She is maintaining good hydration and is mindful of fluid intake timing to minimize nocturia.  She is here today for evaluation and repeat blood work before undergoing cycle #60.   MEDICAL HISTORY: Past Medical History:  Diagnosis Date   Adenocarcinoma of right lung (HCC) 02/15/2022   Adenocarcinoma of right lung, stage 4 (HCC) 06/14/2021   Adenocarcinoma, lung, right (HCC) 05/03/2021    Kirsten Williams is a 54 y.o. female with a history of lung nodules dating back to 2019 who is referred in consultation with Williams Montey, PA-C for assessment and management. She is a former smoker having quit in 2016. 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 She missed her screening CT last year and imaging was ordered last month. 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   Hypercalcemia 08/23/2022   Hypertension, essential, benign    Hypokalemia 04/22/2024   INSOMNIA NOS 02/06/2007   Qualifier: Diagnosis of   By: Damien Folks         MIGRAINE, UNSPEC., W/O INTRACTABLE MIGRAINE 02/06/2007   Qualifier: Diagnosis of   By: Damien Folks      Replacing diagnoses that were inactivated after the 03/11/23 regulatory import     Osteoporosis 03/01/2022   Palpitations    Pleuritic chest pain 10/24/2022   Post-op pain 05/04/2021   Rheumatoid arthritis (HCC) 02/15/2022   Skin infection 11/13/2023   SLE (systemic lupus erythematosus) (HCC) 05/03/2021   SLE (systemic lupus erythematosus) (HCC) 05/03/2021   TOBACCO DEPENDENCE 02/06/2007  Qualifier: Diagnosis of   By: Damien Folks         URI (upper respiratory infection) 12/09/2024   tx w/doxycycline 7-10 days and Robitussin & DayQuil as needed    ALLERGIES:  is allergic to clindamycin/lincomycin, losartan, and carboplatin .  MEDICATIONS:  Current Outpatient Medications  Medication Sig Dispense Refill   Acetaminophen  (TYLENOL ) 325 MG CAPS Take 325 mg by mouth daily as needed (pain).     amLODipine (NORVASC)  10 MG tablet Take 10 mg by mouth daily.     Calcium  Carbonate-Vit D-Min (CALCIUM  1200 PO) Take 1 capsule by mouth daily.     carvedilol  (COREG ) 6.25 MG tablet Take 1 tablet (6.25 mg total) by mouth 2 (two) times daily. 180 tablet 3   CLOBETASOL PROPIONATE E 0.05 % emollient cream Apply topically.     cyclobenzaprine (FLEXERIL) 10 MG tablet Take 10 mg by mouth at bedtime as needed for muscle spasms.     denosumab  (PROLIA ) 60 MG/ML SOSY injection Inject 60 mg into the skin every 6 (six) months.     dicyclomine (BENTYL) 10 MG capsule Take 10 mg by mouth every 6 (six) hours as needed for spasms.     diphenhydrAMINE  (BENADRYL ) 25 MG tablet Take 25 mg by mouth daily as needed for allergies.     doxycycline (VIBRA-TABS) 100 MG tablet Take 100 mg by mouth daily.     famotidine  (PEPCID ) 40 MG tablet Take 40 mg by mouth daily.     fluticasone (FLONASE) 50 MCG/ACT nasal spray Place 2 sprays into both nostrils daily.     folic acid  (FOLVITE ) 1 MG tablet TAKE 1 TABLET(1 MG) BY MOUTH DAILY 30 tablet 4   furosemide (LASIX) 20 MG tablet Take 20 mg by mouth daily.     gabapentin (NEURONTIN) 300 MG capsule Take 300 mg by mouth at bedtime.     hydrocortisone  1 % lotion Apply 1 application topically 2 (two) times daily. 118 mL 0   hydroxychloroquine (PLAQUENIL) 200 MG tablet Take 200 mg by mouth 2 (two) times daily.     Multiple Vitamins-Minerals (MULTI ADULT GUMMIES) CHEW Chew 2 tablets by mouth daily.     omeprazole (PRILOSEC) 40 MG capsule Take 40 mg by mouth daily as needed (acid reflux).     oxyCODONE -acetaminophen  (PERCOCET/ROXICET) 5-325 MG tablet Take 1 tablet by mouth every 8 (eight) hours as needed for severe pain (pain score 7-10). 30 tablet 0   potassium chloride  SA (KLOR-CON  M) 20 MEQ tablet Take 1 tablet (20 mEq total) by mouth 2 (two) times daily. 16 tablet 0   potassium chloride  SA (KLOR-CON  M) 20 MEQ tablet Take 1 tablet (20 mEq total) by mouth 2 (two) times daily. 14 tablet 0   Probiotic Product  (PROBIOTIC-10 PO) Take 1 tablet by mouth daily.     prochlorperazine  (COMPAZINE ) 10 MG tablet TAKE 1 TABLET(10 MG) BY MOUTH EVERY 6 HOURS AS NEEDED 30 tablet 2   rizatriptan (MAXALT-MLT) 10 MG disintegrating tablet Take 10 mg by mouth 2 (two) times daily as needed for migraine.     rosuvastatin  (CRESTOR ) 5 MG tablet Take 1 tablet (5 mg total) by mouth 3 (three) times a week. 48 tablet 3   tetrahydrozoline 0.05 % ophthalmic solution Place 1 drop into both eyes once. Systane Eye Drops once daily both eyes     triamcinolone ointment (KENALOG) 0.5 % Apply 1 Application topically 3 (three) times daily.     No current facility-administered medications for this visit.  SURGICAL HISTORY:  Past Surgical History:  Procedure Laterality Date   ABDOMINAL HYSTERECTOMY     BRONCHIAL BIOPSY  04/24/2021   Procedure: BRONCHIAL BIOPSIES;  Surgeon: Shelah Lamar RAMAN, MD;  Location: [facility] ENDOSCOPY;  Service: Pulmonary;;   BRONCHIAL BRUSHINGS  04/24/2021   Procedure: BRONCHIAL BRUSHINGS;  Surgeon: Shelah Lamar RAMAN, MD;  Location: [facility] ENDOSCOPY;  Service: Pulmonary;;   BRONCHIAL NEEDLE ASPIRATION BIOPSY  04/24/2021   Procedure: BRONCHIAL NEEDLE ASPIRATION BIOPSIES;  Surgeon: Shelah Lamar RAMAN, MD;  Location: [facility] ENDOSCOPY;  Service: Pulmonary;;   BRONCHIAL WASHINGS  04/24/2021   Procedure: BRONCHIAL WASHINGS;  Surgeon: Shelah Lamar RAMAN, MD;  Location: [facility] ENDOSCOPY;  Service: Pulmonary;;   CESAREAN SECTION  11/09/1990   DILATION AND CURETTAGE OF UTERUS Bilateral 09/09/1996   FIDUCIAL MARKER PLACEMENT  04/24/2021   Procedure: FIDUCIAL MARKER PLACEMENT;  Surgeon: Shelah Lamar RAMAN, MD;  Location: [facility] ENDOSCOPY;  Service: Pulmonary;;   TONSILLECTOMY  12/10/1977   VIDEO BRONCHOSCOPY WITH ENDOBRONCHIAL NAVIGATION Bilateral 04/24/2021   Procedure: VIDEO BRONCHOSCOPY WITH ENDOBRONCHIAL NAVIGATION;  Surgeon: Shelah Lamar RAMAN, MD;  Location: [facility] ENDOSCOPY;  Service: Pulmonary;  Laterality: Bilateral;    REVIEW OF SYSTEMS:   Review of  Systems  Constitutional: Negative for appetite change, chills, fatigue, fever and unexpected weight change.  HENT:   Negative for mouth sores, nosebleeds, sore throat and trouble swallowing.   Eyes: Negative for eye problems and icterus.  Respiratory: Negative for cough, hemoptysis, shortness of breath and wheezing.   Cardiovascular: Negative for chest pain and leg swelling.  Gastrointestinal: Negative for abdominal pain, constipation, diarrhea, nausea and vomiting.  Genitourinary: Negative for bladder incontinence, difficulty urinating, dysuria, frequency and hematuria.   Musculoskeletal: Negative for back pain, gait problem, neck pain and neck stiffness.  Skin: Negative for itching and rash.  Neurological: Negative for dizziness, extremity weakness, gait problem, headaches, light-headedness and seizures.  Hematological: Negative for adenopathy. Does not bruise/bleed easily.  Psychiatric/Behavioral: Negative for confusion, depression and sleep disturbance. The patient is not nervous/anxious.     PHYSICAL EXAMINATION:  There were no vitals taken for this visit.  ECOG PERFORMANCE STATUS: {CHL ONC ECOG [id number]  Physical Exam  Constitutional: Oriented to person, place, and time and well-developed, well-nourished, and in no distress. No distress.  HENT:  Head: Normocephalic and atraumatic.  Mouth/Throat: Oropharynx is clear and moist. No oropharyngeal exudate.  Eyes: Conjunctivae are normal. Right eye exhibits no discharge. Left eye exhibits no discharge. No scleral icterus.  Neck: Normal range of motion. Neck supple.  Cardiovascular: Normal rate, regular rhythm, normal heart sounds and intact distal pulses.   Pulmonary/Chest: Effort normal and breath sounds normal. No respiratory distress. No wheezes. No rales.  Abdominal: Soft. Bowel sounds are normal. Exhibits no distension and no mass. There is no tenderness.  Musculoskeletal: Normal range of motion. Exhibits no edema.   Lymphadenopathy:    No cervical adenopathy.  Neurological: Alert and oriented to person, place, and time. Exhibits normal muscle tone. Gait normal. Coordination normal.  Skin: Skin is warm and dry. No rash noted. Not diaphoretic. No erythema. No pallor.  Psychiatric: Mood, memory and judgment normal.  Vitals reviewed.  LABORATORY DATA: Lab Results  Component Value Date   WBC 7.9 12/30/2024   HGB 8.5 (L) 12/30/2024   HCT 26.2 (L) 12/30/2024   MCV 102.3 (H) 12/30/2024   PLT 432 (H) 12/30/2024      Chemistry      Component Value Date/Time   NA 137 12/30/2024 0931   NA 142  07/01/2024 0902   K 3.8 12/30/2024 0931   CL 100 12/30/2024 0931   CO2 22 12/30/2024 0931   BUN 16 12/30/2024 0931   BUN 29 (H) 07/01/2024 0902   CREATININE 1.13 (H) 12/30/2024 0931   GLU 107 03/13/2021 0000      Component Value Date/Time   CALCIUM  9.4 12/30/2024 0931   ALKPHOS 120 12/30/2024 0931   AST 28 12/30/2024 0931   ALT 15 12/30/2024 0931   BILITOT <0.2 12/30/2024 0931       RADIOGRAPHIC STUDIES:  No results found.   ASSESSMENT/PLAN:  This is a very pleasant 54 year old Caucasian female diagnosed with stage IV (T4, N2, M1 a) non-small cell lung cancer, adenocarcinoma presented with multifocal disease involving the right upper lobe, right lower lobe as well as suspicious lower paratracheal lymphadenopathy and groundglass nodules in the left lung diagnosed in May 2022. The patient had molecular studies performed by [facility] and that showed no detectable mutation but this is likely secondary to low-level circulating free tumor DNA. If the patient has disease progression in the future, then we will likely retest her for molecular studies.    The patient is currently undergoing systemic chemotherapy with carboplatin  for an AUC 5, Alimta  500 mg per metered square, and and Avastin  15 mg/kg IV every 3 weeks.  She is status post 59 cycles.  Starting from cycle #7, the patient has been on  maintenance treatment with Alimta  and Avastin .   ***The patient requested to delay her treatment until after her upcoming procedure.    ***We will delay her until 01/12/25.   ***Labs were reviewed. Her urine protein is elevated. I will remove avastin  from her treatment plan for the next cycle.    We will remove the avastin  today.  Recommend that she *** with cycle #60 today as scheduled with alimta .   She is scheduled for PEF on 01/06/24. I previously reach out to Dr. Shelah about repeat biopsy for molecular studies during the bronchoscopy. He stated that would not be a problem.    Advised to report unusual or prolonged bleeding due to RBC decrease and prior dental bleeding.I have added an order for sample to blood bank.    Abnormal weight loss Significant weight and muscle loss during influenza, contributing to weakness and chemotherapy delay. - Assessed weight and physical status during visit. - Advised to focus on regaining strength and weight before resuming chemotherapy.   The patient was advised to call immediately if she has any concerning symptoms in the interval. The patient voices understanding of current disease status and treatment options and is in agreement with the current care plan. All questions were answered. The patient knows to call the clinic with any problems, questions or concerns. We can certainly see the patient much sooner if necessary   No orders of the defined types were placed in this encounter.    I spent {CHL ONC TIME VISIT - DTPQU:[id number]} counseling the patient face to face. The total time spent in the appointment was {CHL ONC TIME VISIT - DTPQU:[id number]}.  Helma Argyle L Boomer Winders, PA-C 01/09/25

## 2025-01-12 ENCOUNTER — Telehealth: Payer: Self-pay | Admitting: Internal Medicine

## 2025-01-12 ENCOUNTER — Other Ambulatory Visit: Payer: Self-pay | Admitting: Physician Assistant

## 2025-01-12 ENCOUNTER — Ambulatory Visit: Admitting: Acute Care

## 2025-01-12 DIAGNOSIS — C3491 Malignant neoplasm of unspecified part of right bronchus or lung: Secondary | ICD-10-CM

## 2025-01-13 ENCOUNTER — Inpatient Hospital Stay

## 2025-01-13 ENCOUNTER — Inpatient Hospital Stay: Admitting: Nurse Practitioner

## 2025-01-13 ENCOUNTER — Inpatient Hospital Stay: Admitting: Physician Assistant

## 2025-01-19 ENCOUNTER — Encounter (HOSPITAL_COMMUNITY): Admission: RE | Payer: Self-pay | Source: Home / Self Care

## 2025-01-19 ENCOUNTER — Ambulatory Visit (HOSPITAL_COMMUNITY): Admission: RE | Admit: 2025-01-19 | Source: Home / Self Care | Admitting: Emergency Medicine

## 2025-01-19 DIAGNOSIS — R918 Other nonspecific abnormal finding of lung field: Secondary | ICD-10-CM | POA: Insufficient documentation

## 2025-01-19 SURGERY — VIDEO BRONCHOSCOPY WITH ENDOBRONCHIAL NAVIGATION
Anesthesia: General | Laterality: Bilateral

## 2025-01-20 ENCOUNTER — Inpatient Hospital Stay

## 2025-01-20 ENCOUNTER — Inpatient Hospital Stay: Admitting: Nurse Practitioner

## 2025-01-26 ENCOUNTER — Ambulatory Visit: Admitting: Acute Care

## 2025-02-03 ENCOUNTER — Inpatient Hospital Stay: Attending: Hematology and Oncology

## 2025-02-03 ENCOUNTER — Inpatient Hospital Stay: Admitting: Physician Assistant

## 2025-02-03 ENCOUNTER — Inpatient Hospital Stay

## 2025-02-10 ENCOUNTER — Inpatient Hospital Stay

## 2025-02-10 ENCOUNTER — Inpatient Hospital Stay: Admitting: Internal Medicine

## 2025-02-24 ENCOUNTER — Inpatient Hospital Stay: Admitting: Internal Medicine

## 2025-02-24 ENCOUNTER — Inpatient Hospital Stay

## 2025-02-24 ENCOUNTER — Inpatient Hospital Stay: Attending: Hematology and Oncology

## 2025-05-05 ENCOUNTER — Ambulatory Visit: Admitting: Neurology

## 2025-05-13 ENCOUNTER — Ambulatory Visit
